# Patient Record
Sex: Female | Born: 1965
Health system: Southern US, Community
[De-identification: ages and names within clinical notes are randomized; demographics above are authoritative.]

## PROBLEM LIST (undated history)

## (undated) DIAGNOSIS — M1710 Unilateral primary osteoarthritis, unspecified knee: Secondary | ICD-10-CM

## (undated) DIAGNOSIS — I10 Essential (primary) hypertension: Secondary | ICD-10-CM

## (undated) DIAGNOSIS — G4730 Sleep apnea, unspecified: Secondary | ICD-10-CM

## (undated) DIAGNOSIS — F329 Major depressive disorder, single episode, unspecified: Secondary | ICD-10-CM

## (undated) DIAGNOSIS — M75120 Complete rotator cuff tear or rupture of unspecified shoulder, not specified as traumatic: Secondary | ICD-10-CM

## (undated) DIAGNOSIS — M179 Osteoarthritis of knee, unspecified: Secondary | ICD-10-CM

## (undated) DIAGNOSIS — G8929 Other chronic pain: Secondary | ICD-10-CM

## (undated) DIAGNOSIS — E669 Obesity, unspecified: Secondary | ICD-10-CM

## (undated) DIAGNOSIS — K219 Gastro-esophageal reflux disease without esophagitis: Secondary | ICD-10-CM

## (undated) DIAGNOSIS — M25572 Pain in left ankle and joints of left foot: Secondary | ICD-10-CM

## (undated) DIAGNOSIS — F32A Depression, unspecified: Secondary | ICD-10-CM

## (undated) DIAGNOSIS — F419 Anxiety disorder, unspecified: Secondary | ICD-10-CM

## (undated) DIAGNOSIS — E785 Hyperlipidemia, unspecified: Secondary | ICD-10-CM

## (undated) DIAGNOSIS — N62 Hypertrophy of breast: Secondary | ICD-10-CM

## (undated) DIAGNOSIS — M25569 Pain in unspecified knee: Secondary | ICD-10-CM

## (undated) DIAGNOSIS — M5412 Radiculopathy, cervical region: Secondary | ICD-10-CM

## (undated) DIAGNOSIS — E039 Hypothyroidism, unspecified: Secondary | ICD-10-CM

## (undated) DIAGNOSIS — M5020 Other cervical disc displacement, unspecified cervical region: Secondary | ICD-10-CM

## (undated) DIAGNOSIS — M549 Dorsalgia, unspecified: Secondary | ICD-10-CM

## (undated) DIAGNOSIS — D649 Anemia, unspecified: Secondary | ICD-10-CM

## (undated) DIAGNOSIS — G5600 Carpal tunnel syndrome, unspecified upper limb: Secondary | ICD-10-CM

## (undated) DIAGNOSIS — T7840XA Allergy, unspecified, initial encounter: Secondary | ICD-10-CM

## (undated) HISTORY — DX: Obesity, unspecified: E66.9

## (undated) HISTORY — DX: Unilateral primary osteoarthritis, unspecified knee: M17.10

## (undated) HISTORY — PX: PARTIAL HYSTERECTOMY: SHX80

## (undated) HISTORY — DX: Complete rotator cuff tear or rupture of unspecified shoulder, not specified as traumatic: M75.120

## (undated) HISTORY — DX: Other cervical disc displacement, unspecified cervical region: M50.20

## (undated) HISTORY — DX: Other chronic pain: G89.29

## (undated) HISTORY — PX: POLYPECTOMY: SHX149

## (undated) HISTORY — DX: Anemia, unspecified: D64.9

## (undated) HISTORY — DX: Anxiety disorder, unspecified: F41.9

## (undated) HISTORY — DX: Carpal tunnel syndrome, unspecified upper limb: G56.00

## (undated) HISTORY — DX: Depression, unspecified: F32.A

## (undated) HISTORY — DX: Osteoarthritis of knee, unspecified: M17.9

## (undated) HISTORY — DX: Dorsalgia, unspecified: M54.9

## (undated) HISTORY — DX: Radiculopathy, cervical region: M54.12

## (undated) HISTORY — DX: Hyperlipidemia, unspecified: E78.5

## (undated) HISTORY — DX: Major depressive disorder, single episode, unspecified: F32.9

## (undated) HISTORY — DX: Pain in unspecified knee: M25.569

## (undated) HISTORY — PX: COLONOSCOPY: SHX174

## (undated) HISTORY — DX: Pain in left ankle and joints of left foot: M25.572

## (undated) HISTORY — DX: Hypertrophy of breast: N62

## (undated) HISTORY — DX: Allergy, unspecified, initial encounter: T78.40XA

---

## 2000-11-01 ENCOUNTER — Encounter: Payer: Self-pay | Admitting: Internal Medicine

## 2000-11-01 ENCOUNTER — Ambulatory Visit (HOSPITAL_COMMUNITY): Admission: RE | Admit: 2000-11-01 | Discharge: 2000-11-01 | Payer: Self-pay | Admitting: Internal Medicine

## 2001-01-04 ENCOUNTER — Encounter: Admission: RE | Admit: 2001-01-04 | Discharge: 2001-01-04 | Payer: Self-pay | Admitting: Neurosurgery

## 2001-01-04 ENCOUNTER — Encounter: Payer: Self-pay | Admitting: Neurosurgery

## 2001-01-06 ENCOUNTER — Emergency Department (HOSPITAL_COMMUNITY): Admission: EM | Admit: 2001-01-06 | Discharge: 2001-01-06 | Payer: Self-pay | Admitting: Internal Medicine

## 2001-01-28 ENCOUNTER — Encounter: Admission: RE | Admit: 2001-01-28 | Discharge: 2001-01-28 | Payer: Self-pay | Admitting: Neurosurgery

## 2001-01-28 ENCOUNTER — Encounter: Payer: Self-pay | Admitting: Neurosurgery

## 2001-02-12 ENCOUNTER — Encounter: Admission: RE | Admit: 2001-02-12 | Discharge: 2001-02-12 | Payer: Self-pay | Admitting: Neurosurgery

## 2001-02-12 ENCOUNTER — Encounter: Payer: Self-pay | Admitting: Neurosurgery

## 2001-02-27 HISTORY — PX: OTHER SURGICAL HISTORY: SHX169

## 2003-09-04 ENCOUNTER — Ambulatory Visit (HOSPITAL_COMMUNITY): Admission: RE | Admit: 2003-09-04 | Discharge: 2003-09-04 | Payer: Self-pay | Admitting: Family Medicine

## 2004-01-11 ENCOUNTER — Ambulatory Visit: Payer: Self-pay | Admitting: Family Medicine

## 2004-01-19 ENCOUNTER — Encounter: Payer: Self-pay | Admitting: Orthopedic Surgery

## 2004-01-19 ENCOUNTER — Ambulatory Visit (HOSPITAL_COMMUNITY): Admission: RE | Admit: 2004-01-19 | Discharge: 2004-01-19 | Payer: Self-pay | Admitting: Family Medicine

## 2004-02-16 ENCOUNTER — Encounter: Payer: Self-pay | Admitting: Orthopedic Surgery

## 2004-02-16 ENCOUNTER — Emergency Department (HOSPITAL_COMMUNITY): Admission: EM | Admit: 2004-02-16 | Discharge: 2004-02-16 | Payer: Self-pay | Admitting: Emergency Medicine

## 2004-03-02 ENCOUNTER — Ambulatory Visit: Payer: Self-pay | Admitting: Family Medicine

## 2004-03-27 ENCOUNTER — Encounter: Payer: Self-pay | Admitting: Family Medicine

## 2004-03-27 ENCOUNTER — Ambulatory Visit: Admission: RE | Admit: 2004-03-27 | Discharge: 2004-03-27 | Payer: Self-pay | Admitting: Family Medicine

## 2004-03-27 ENCOUNTER — Ambulatory Visit: Payer: Self-pay | Admitting: Pulmonary Disease

## 2004-04-15 ENCOUNTER — Ambulatory Visit: Payer: Self-pay | Admitting: Family Medicine

## 2004-04-28 ENCOUNTER — Encounter (HOSPITAL_COMMUNITY): Admission: RE | Admit: 2004-04-28 | Discharge: 2004-05-28 | Payer: Self-pay | Admitting: Family Medicine

## 2004-06-13 ENCOUNTER — Ambulatory Visit: Payer: Self-pay | Admitting: Family Medicine

## 2004-06-17 ENCOUNTER — Ambulatory Visit (HOSPITAL_COMMUNITY): Admission: RE | Admit: 2004-06-17 | Discharge: 2004-06-17 | Payer: Self-pay | Admitting: Family Medicine

## 2004-06-26 ENCOUNTER — Emergency Department (HOSPITAL_COMMUNITY): Admission: EM | Admit: 2004-06-26 | Discharge: 2004-06-26 | Payer: Self-pay | Admitting: Emergency Medicine

## 2004-07-01 ENCOUNTER — Ambulatory Visit: Payer: Self-pay | Admitting: Family Medicine

## 2004-07-08 ENCOUNTER — Ambulatory Visit: Payer: Self-pay | Admitting: Family Medicine

## 2004-08-19 ENCOUNTER — Ambulatory Visit: Payer: Self-pay | Admitting: Family Medicine

## 2004-10-28 ENCOUNTER — Ambulatory Visit: Payer: Self-pay | Admitting: Family Medicine

## 2004-11-08 ENCOUNTER — Ambulatory Visit (HOSPITAL_COMMUNITY): Admission: RE | Admit: 2004-11-08 | Discharge: 2004-11-08 | Payer: Self-pay | Admitting: Family Medicine

## 2005-01-06 ENCOUNTER — Ambulatory Visit: Payer: Self-pay | Admitting: Family Medicine

## 2005-01-23 ENCOUNTER — Ambulatory Visit (HOSPITAL_COMMUNITY): Admission: RE | Admit: 2005-01-23 | Discharge: 2005-01-23 | Payer: Self-pay | Admitting: Family Medicine

## 2005-02-17 ENCOUNTER — Ambulatory Visit: Payer: Self-pay | Admitting: Family Medicine

## 2005-02-22 ENCOUNTER — Ambulatory Visit (HOSPITAL_COMMUNITY): Admission: RE | Admit: 2005-02-22 | Discharge: 2005-02-22 | Payer: Self-pay | Admitting: Family Medicine

## 2005-02-28 ENCOUNTER — Ambulatory Visit: Payer: Self-pay | Admitting: Family Medicine

## 2005-03-24 ENCOUNTER — Ambulatory Visit: Payer: Self-pay | Admitting: Family Medicine

## 2005-06-01 ENCOUNTER — Ambulatory Visit: Payer: Self-pay | Admitting: Family Medicine

## 2005-06-23 ENCOUNTER — Encounter: Admission: RE | Admit: 2005-06-23 | Discharge: 2005-06-23 | Payer: Self-pay | Admitting: Family Medicine

## 2005-07-07 ENCOUNTER — Encounter: Admission: RE | Admit: 2005-07-07 | Discharge: 2005-07-07 | Payer: Self-pay | Admitting: Family Medicine

## 2005-07-13 ENCOUNTER — Ambulatory Visit: Payer: Self-pay | Admitting: Family Medicine

## 2005-07-30 ENCOUNTER — Emergency Department (HOSPITAL_COMMUNITY): Admission: EM | Admit: 2005-07-30 | Discharge: 2005-07-30 | Payer: Self-pay | Admitting: Emergency Medicine

## 2005-09-15 ENCOUNTER — Other Ambulatory Visit: Admission: RE | Admit: 2005-09-15 | Discharge: 2005-09-15 | Payer: Self-pay | Admitting: Family Medicine

## 2005-09-15 ENCOUNTER — Ambulatory Visit: Payer: Self-pay | Admitting: Family Medicine

## 2005-09-15 LAB — CONVERTED CEMR LAB: Pap Smear: NORMAL

## 2005-10-27 ENCOUNTER — Ambulatory Visit: Payer: Self-pay | Admitting: Family Medicine

## 2005-11-01 ENCOUNTER — Ambulatory Visit (HOSPITAL_COMMUNITY): Admission: RE | Admit: 2005-11-01 | Discharge: 2005-11-01 | Payer: Self-pay | Admitting: Family Medicine

## 2006-01-26 ENCOUNTER — Ambulatory Visit: Payer: Self-pay | Admitting: Family Medicine

## 2006-04-17 ENCOUNTER — Emergency Department (HOSPITAL_COMMUNITY): Admission: EM | Admit: 2006-04-17 | Discharge: 2006-04-17 | Payer: Self-pay | Admitting: Emergency Medicine

## 2006-04-18 ENCOUNTER — Ambulatory Visit: Payer: Self-pay | Admitting: Family Medicine

## 2006-04-18 LAB — CONVERTED CEMR LAB
BUN: 16 mg/dL (ref 6–23)
CO2: 20 meq/L (ref 19–32)
Calcium: 10.1 mg/dL (ref 8.4–10.5)
Chloride: 103 meq/L (ref 96–112)
Cholesterol: 255 mg/dL — ABNORMAL HIGH (ref 0–200)
Creatinine, Ser: 0.93 mg/dL (ref 0.40–1.20)
Glucose, Bld: 96 mg/dL (ref 70–99)
HDL: 66 mg/dL (ref >39)
LDL Cholesterol: 171 mg/dL — ABNORMAL HIGH (ref 0–99)
Potassium: 4.6 meq/L (ref 3.5–5.3)
Sodium: 142 meq/L (ref 135–145)
TSH: 3.972 microintl units/mL (ref 0.350–5.50)
Total CHOL/HDL Ratio: 3.9
Triglycerides: 90 mg/dL (ref <150)
VLDL: 18 mg/dL (ref 0–40)

## 2006-04-19 ENCOUNTER — Encounter: Payer: Self-pay | Admitting: Family Medicine

## 2006-04-19 LAB — CONVERTED CEMR LAB
ALT: 28 units/L (ref 0–35)
AST: 19 units/L (ref 0–37)
Albumin: 4.3 g/dL (ref 3.5–5.2)
Alkaline Phosphatase: 134 units/L — ABNORMAL HIGH (ref 39–117)
Bilirubin, Direct: 0.1 mg/dL (ref 0.0–0.3)
Indirect Bilirubin: 0.3 mg/dL (ref 0.0–0.9)
Total Bilirubin: 0.4 mg/dL (ref 0.3–1.2)
Total Protein: 8.2 g/dL (ref 6.0–8.3)

## 2006-05-16 ENCOUNTER — Ambulatory Visit (HOSPITAL_COMMUNITY): Admission: RE | Admit: 2006-05-16 | Discharge: 2006-05-16 | Payer: Self-pay | Admitting: Family Medicine

## 2006-06-19 ENCOUNTER — Ambulatory Visit: Payer: Self-pay | Admitting: Family Medicine

## 2006-07-19 ENCOUNTER — Ambulatory Visit: Payer: Self-pay | Admitting: Family Medicine

## 2006-07-19 LAB — CONVERTED CEMR LAB
ALT: 38 units/L — ABNORMAL HIGH (ref 0–35)
AST: 21 units/L (ref 0–37)
Albumin: 4.2 g/dL (ref 3.5–5.2)
Alkaline Phosphatase: 189 units/L — ABNORMAL HIGH (ref 39–117)
BUN: 19 mg/dL (ref 6–23)
Bilirubin, Direct: <0.1 mg/dL (ref 0.0–0.3)
CO2: 25 meq/L (ref 19–32)
Calcium: 10 mg/dL (ref 8.4–10.5)
Chloride: 104 meq/L (ref 96–112)
Cholesterol: 173 mg/dL (ref 0–200)
Creatinine, Ser: 0.83 mg/dL (ref 0.40–1.20)
Glucose, Bld: 92 mg/dL (ref 70–99)
HDL: 79 mg/dL (ref >39)
LDL Cholesterol: 76 mg/dL (ref 0–99)
Potassium: 4.7 meq/L (ref 3.5–5.3)
Sodium: 142 meq/L (ref 135–145)
Total Bilirubin: 0.3 mg/dL (ref 0.3–1.2)
Total CHOL/HDL Ratio: 2.2
Total Protein: 8.4 g/dL — ABNORMAL HIGH (ref 6.0–8.3)
Triglycerides: 90 mg/dL (ref <150)
VLDL: 18 mg/dL (ref 0–40)

## 2006-12-03 ENCOUNTER — Ambulatory Visit: Payer: Self-pay | Admitting: Family Medicine

## 2006-12-04 ENCOUNTER — Encounter: Payer: Self-pay | Admitting: Family Medicine

## 2006-12-04 LAB — CONVERTED CEMR LAB
ALT: 26 units/L (ref 0–35)
AST: 29 units/L (ref 0–37)
Albumin: 3.8 g/dL (ref 3.5–5.2)
Alkaline Phosphatase: 138 units/L — ABNORMAL HIGH (ref 39–117)
Bilirubin, Direct: 0.1 mg/dL (ref 0.0–0.3)
Cholesterol: 160 mg/dL (ref 0–200)
Glucose, Bld: 104 mg/dL — ABNORMAL HIGH (ref 70–99)
HDL: 62 mg/dL (ref >39)
Indirect Bilirubin: 0.2 mg/dL (ref 0.0–0.9)
LDL Cholesterol: 80 mg/dL (ref 0–99)
Total Bilirubin: 0.3 mg/dL (ref 0.3–1.2)
Total CHOL/HDL Ratio: 2.6
Total Protein: 7.5 g/dL (ref 6.0–8.3)
Triglycerides: 88 mg/dL (ref <150)
VLDL: 18 mg/dL (ref 0–40)

## 2007-02-28 ENCOUNTER — Encounter: Payer: Self-pay | Admitting: Family Medicine

## 2007-04-03 ENCOUNTER — Ambulatory Visit: Payer: Self-pay | Admitting: Family Medicine

## 2007-04-03 LAB — CONVERTED CEMR LAB
BUN: 16 mg/dL (ref 6–23)
CO2: 23 meq/L (ref 19–32)
Calcium: 9.4 mg/dL (ref 8.4–10.5)
Chloride: 103 meq/L (ref 96–112)
Cholesterol: 217 mg/dL — ABNORMAL HIGH (ref 0–200)
Creatinine, Ser: 0.69 mg/dL (ref 0.40–1.20)
Glucose, Bld: 80 mg/dL (ref 70–99)
HDL: 71 mg/dL (ref >39)
LDL Cholesterol: 133 mg/dL — ABNORMAL HIGH (ref 0–99)
Potassium: 5.4 meq/L — ABNORMAL HIGH (ref 3.5–5.3)
Sodium: 140 meq/L (ref 135–145)
Total CHOL/HDL Ratio: 3.1
Triglycerides: 67 mg/dL (ref <150)
VLDL: 13 mg/dL (ref 0–40)

## 2007-04-22 ENCOUNTER — Emergency Department (HOSPITAL_COMMUNITY): Admission: EM | Admit: 2007-04-22 | Discharge: 2007-04-22 | Payer: Self-pay | Admitting: Emergency Medicine

## 2007-04-22 ENCOUNTER — Encounter: Payer: Self-pay | Admitting: Orthopedic Surgery

## 2007-05-03 ENCOUNTER — Ambulatory Visit: Payer: Self-pay | Admitting: Family Medicine

## 2007-05-20 ENCOUNTER — Ambulatory Visit (HOSPITAL_COMMUNITY): Admission: RE | Admit: 2007-05-20 | Discharge: 2007-05-20 | Payer: Self-pay | Admitting: Family Medicine

## 2007-05-21 ENCOUNTER — Ambulatory Visit: Payer: Self-pay | Admitting: Orthopedic Surgery

## 2007-05-30 ENCOUNTER — Encounter: Payer: Self-pay | Admitting: Orthopedic Surgery

## 2007-06-04 ENCOUNTER — Telehealth: Payer: Self-pay | Admitting: Orthopedic Surgery

## 2007-06-14 ENCOUNTER — Ambulatory Visit: Payer: Self-pay | Admitting: Family Medicine

## 2007-06-17 ENCOUNTER — Ambulatory Visit: Payer: Self-pay | Admitting: Orthopedic Surgery

## 2007-06-20 ENCOUNTER — Encounter: Payer: Self-pay | Admitting: Orthopedic Surgery

## 2007-06-20 ENCOUNTER — Encounter (HOSPITAL_COMMUNITY): Admission: RE | Admit: 2007-06-20 | Discharge: 2007-07-20 | Payer: Self-pay | Admitting: Orthopedic Surgery

## 2007-06-25 DIAGNOSIS — E669 Obesity, unspecified: Secondary | ICD-10-CM | POA: Insufficient documentation

## 2007-06-25 DIAGNOSIS — J45909 Unspecified asthma, uncomplicated: Secondary | ICD-10-CM | POA: Insufficient documentation

## 2007-06-25 DIAGNOSIS — F329 Major depressive disorder, single episode, unspecified: Secondary | ICD-10-CM | POA: Insufficient documentation

## 2007-06-25 DIAGNOSIS — N62 Hypertrophy of breast: Secondary | ICD-10-CM | POA: Insufficient documentation

## 2007-06-25 DIAGNOSIS — R32 Unspecified urinary incontinence: Secondary | ICD-10-CM | POA: Insufficient documentation

## 2007-07-24 ENCOUNTER — Encounter (HOSPITAL_COMMUNITY): Admission: RE | Admit: 2007-07-24 | Discharge: 2007-08-23 | Payer: Self-pay | Admitting: Orthopedic Surgery

## 2007-08-01 ENCOUNTER — Encounter: Payer: Self-pay | Admitting: Orthopedic Surgery

## 2007-08-02 ENCOUNTER — Encounter: Payer: Self-pay | Admitting: Orthopedic Surgery

## 2007-08-19 ENCOUNTER — Ambulatory Visit: Payer: Self-pay | Admitting: Orthopedic Surgery

## 2007-08-19 DIAGNOSIS — M25579 Pain in unspecified ankle and joints of unspecified foot: Secondary | ICD-10-CM | POA: Insufficient documentation

## 2007-08-21 ENCOUNTER — Ambulatory Visit: Payer: Self-pay | Admitting: Family Medicine

## 2007-08-21 LAB — CONVERTED CEMR LAB
ALT: 27 units/L (ref 0–35)
AST: 19 units/L (ref 0–37)
Albumin: 3.7 g/dL (ref 3.5–5.2)
Alkaline Phosphatase: 129 units/L — ABNORMAL HIGH (ref 39–117)
Bilirubin, Direct: <0.1 mg/dL (ref 0.0–0.3)
Cholesterol: 218 mg/dL — ABNORMAL HIGH (ref 0–200)
HDL: 61 mg/dL (ref >39)
LDL Cholesterol: 134 mg/dL — ABNORMAL HIGH (ref 0–99)
Total Bilirubin: 0.2 mg/dL — ABNORMAL LOW (ref 0.3–1.2)
Total CHOL/HDL Ratio: 3.6
Total Protein: 7.5 g/dL (ref 6.0–8.3)
Triglycerides: 114 mg/dL (ref <150)
VLDL: 23 mg/dL (ref 0–40)

## 2007-09-02 ENCOUNTER — Ambulatory Visit: Payer: Self-pay | Admitting: Orthopedic Surgery

## 2007-09-30 ENCOUNTER — Encounter: Payer: Self-pay | Admitting: Family Medicine

## 2007-10-07 ENCOUNTER — Ambulatory Visit: Payer: Self-pay | Admitting: Orthopedic Surgery

## 2007-10-07 ENCOUNTER — Ambulatory Visit (HOSPITAL_COMMUNITY): Admission: RE | Admit: 2007-10-07 | Discharge: 2007-10-07 | Payer: Self-pay | Admitting: Orthopedic Surgery

## 2007-10-07 DIAGNOSIS — M5412 Radiculopathy, cervical region: Secondary | ICD-10-CM | POA: Insufficient documentation

## 2007-10-08 ENCOUNTER — Telehealth: Payer: Self-pay | Admitting: Family Medicine

## 2007-10-09 ENCOUNTER — Ambulatory Visit: Payer: Self-pay | Admitting: Family Medicine

## 2007-10-15 ENCOUNTER — Encounter (HOSPITAL_COMMUNITY): Admission: RE | Admit: 2007-10-15 | Discharge: 2007-11-14 | Payer: Self-pay | Admitting: Orthopedic Surgery

## 2007-10-15 ENCOUNTER — Encounter: Payer: Self-pay | Admitting: Orthopedic Surgery

## 2007-10-16 ENCOUNTER — Encounter: Payer: Self-pay | Admitting: Family Medicine

## 2007-10-17 ENCOUNTER — Ambulatory Visit: Payer: Self-pay | Admitting: Family Medicine

## 2007-10-17 ENCOUNTER — Other Ambulatory Visit: Admission: RE | Admit: 2007-10-17 | Discharge: 2007-10-17 | Payer: Self-pay | Admitting: Family Medicine

## 2007-10-17 ENCOUNTER — Encounter: Payer: Self-pay | Admitting: Family Medicine

## 2007-10-17 DIAGNOSIS — M25519 Pain in unspecified shoulder: Secondary | ICD-10-CM | POA: Insufficient documentation

## 2007-10-17 DIAGNOSIS — J309 Allergic rhinitis, unspecified: Secondary | ICD-10-CM | POA: Insufficient documentation

## 2007-10-28 ENCOUNTER — Ambulatory Visit: Payer: Self-pay | Admitting: Orthopedic Surgery

## 2007-11-19 ENCOUNTER — Encounter (HOSPITAL_COMMUNITY): Admission: RE | Admit: 2007-11-19 | Discharge: 2007-11-27 | Payer: Self-pay | Admitting: Orthopedic Surgery

## 2007-11-19 ENCOUNTER — Encounter: Payer: Self-pay | Admitting: Family Medicine

## 2007-11-20 ENCOUNTER — Encounter: Payer: Self-pay | Admitting: Orthopedic Surgery

## 2007-11-25 ENCOUNTER — Encounter: Payer: Self-pay | Admitting: Orthopedic Surgery

## 2007-11-25 LAB — CONVERTED CEMR LAB
ALT: 29 units/L (ref 0–35)
AST: 26 units/L (ref 0–37)
Albumin: 4 g/dL (ref 3.5–5.2)
Alkaline Phosphatase: 165 units/L — ABNORMAL HIGH (ref 39–117)
BUN: 9 mg/dL (ref 6–23)
Bilirubin, Direct: 0.1 mg/dL (ref 0.0–0.3)
CO2: 25 meq/L (ref 19–32)
Calcium: 9.5 mg/dL (ref 8.4–10.5)
Chloride: 103 meq/L (ref 96–112)
Cholesterol: 222 mg/dL — ABNORMAL HIGH (ref 0–200)
Creatinine, Ser: 0.77 mg/dL (ref 0.40–1.20)
Glucose, Bld: 89 mg/dL (ref 70–99)
HDL: 69 mg/dL (ref >39)
Indirect Bilirubin: 0.2 mg/dL (ref 0.0–0.9)
LDL Cholesterol: 126 mg/dL — ABNORMAL HIGH (ref 0–99)
Potassium: 4.2 meq/L (ref 3.5–5.3)
Sodium: 143 meq/L (ref 135–145)
Total Bilirubin: 0.3 mg/dL (ref 0.3–1.2)
Total CHOL/HDL Ratio: 3.2
Total Protein: 7.9 g/dL (ref 6.0–8.3)
Triglycerides: 135 mg/dL (ref <150)
VLDL: 27 mg/dL (ref 0–40)

## 2007-11-29 ENCOUNTER — Encounter (HOSPITAL_COMMUNITY): Admission: RE | Admit: 2007-11-29 | Discharge: 2007-12-29 | Payer: Self-pay | Admitting: Orthopedic Surgery

## 2007-12-06 ENCOUNTER — Encounter: Payer: Self-pay | Admitting: Orthopedic Surgery

## 2007-12-30 ENCOUNTER — Encounter: Payer: Self-pay | Admitting: Orthopedic Surgery

## 2008-01-09 ENCOUNTER — Ambulatory Visit: Payer: Self-pay | Admitting: Family Medicine

## 2008-01-17 ENCOUNTER — Encounter (HOSPITAL_COMMUNITY): Admission: RE | Admit: 2008-01-17 | Discharge: 2008-02-16 | Payer: Self-pay | Admitting: Family Medicine

## 2008-01-17 ENCOUNTER — Encounter: Payer: Self-pay | Admitting: Family Medicine

## 2008-02-03 ENCOUNTER — Ambulatory Visit: Payer: Self-pay | Admitting: Family Medicine

## 2008-02-05 ENCOUNTER — Encounter: Payer: Self-pay | Admitting: Family Medicine

## 2008-02-06 ENCOUNTER — Telehealth: Payer: Self-pay | Admitting: Family Medicine

## 2008-03-09 ENCOUNTER — Encounter
Admission: RE | Admit: 2008-03-09 | Discharge: 2008-06-07 | Payer: Self-pay | Admitting: Physical Medicine & Rehabilitation

## 2008-03-10 ENCOUNTER — Encounter: Payer: Self-pay | Admitting: Family Medicine

## 2008-03-10 ENCOUNTER — Ambulatory Visit (HOSPITAL_COMMUNITY)
Admission: RE | Admit: 2008-03-10 | Discharge: 2008-03-10 | Payer: Self-pay | Admitting: Physical Medicine & Rehabilitation

## 2008-03-10 ENCOUNTER — Ambulatory Visit: Payer: Self-pay | Admitting: Physical Medicine & Rehabilitation

## 2008-03-10 ENCOUNTER — Encounter: Payer: Self-pay | Admitting: Orthopedic Surgery

## 2008-03-18 ENCOUNTER — Ambulatory Visit: Payer: Self-pay | Admitting: Family Medicine

## 2008-03-19 ENCOUNTER — Encounter: Payer: Self-pay | Admitting: Family Medicine

## 2008-04-07 ENCOUNTER — Ambulatory Visit: Payer: Self-pay | Admitting: Physical Medicine & Rehabilitation

## 2008-05-18 ENCOUNTER — Ambulatory Visit: Payer: Self-pay | Admitting: Family Medicine

## 2008-05-18 DIAGNOSIS — R5381 Other malaise: Secondary | ICD-10-CM | POA: Insufficient documentation

## 2008-05-18 DIAGNOSIS — R5383 Other fatigue: Secondary | ICD-10-CM

## 2008-05-20 LAB — CONVERTED CEMR LAB
ALT: 34 units/L (ref 0–35)
AST: 25 units/L (ref 0–37)
Albumin: 4.2 g/dL (ref 3.5–5.2)
Alkaline Phosphatase: 161 units/L — ABNORMAL HIGH (ref 39–117)
BUN: 11 mg/dL (ref 6–23)
Basophils Absolute: 0 10*3/uL (ref 0.0–0.1)
Basophils Relative: 0 % (ref 0–1)
Bilirubin, Direct: 0.1 mg/dL (ref 0.0–0.3)
CO2: 25 meq/L (ref 19–32)
Calcium: 9.8 mg/dL (ref 8.4–10.5)
Chloride: 105 meq/L (ref 96–112)
Cholesterol: 254 mg/dL — ABNORMAL HIGH (ref 0–200)
Creatinine, Ser: 0.69 mg/dL (ref 0.40–1.20)
Eosinophils Absolute: 0.1 10*3/uL (ref 0.0–0.7)
Eosinophils Relative: 2 % (ref 0–5)
Glucose, Bld: 87 mg/dL (ref 70–99)
HCT: 38.8 % (ref 36.0–46.0)
HDL: 68 mg/dL (ref >39)
Hemoglobin: 12.3 g/dL (ref 12.0–15.0)
Indirect Bilirubin: 0.2 mg/dL (ref 0.0–0.9)
LDL Cholesterol: 161 mg/dL — ABNORMAL HIGH (ref 0–99)
Lymphocytes Relative: 46 % (ref 12–46)
Lymphs Abs: 3.7 10*3/uL (ref 0.7–4.0)
MCHC: 31.7 g/dL (ref 30.0–36.0)
MCV: 85.8 fL (ref 78.0–100.0)
Monocytes Absolute: 0.4 10*3/uL (ref 0.1–1.0)
Monocytes Relative: 5 % (ref 3–12)
Neutro Abs: 3.9 10*3/uL (ref 1.7–7.7)
Neutrophils Relative %: 47 % (ref 43–77)
Platelets: 342 10*3/uL (ref 150–400)
Potassium: 4.6 meq/L (ref 3.5–5.3)
RBC: 4.52 M/uL (ref 3.87–5.11)
RDW: 16.4 % — ABNORMAL HIGH (ref 11.5–15.5)
Sodium: 143 meq/L (ref 135–145)
Total Bilirubin: 0.3 mg/dL (ref 0.3–1.2)
Total CHOL/HDL Ratio: 3.7
Total Protein: 8.2 g/dL (ref 6.0–8.3)
Triglycerides: 127 mg/dL (ref <150)
VLDL: 25 mg/dL (ref 0–40)
WBC: 8.2 10*3/uL (ref 4.0–10.5)

## 2008-05-28 ENCOUNTER — Ambulatory Visit (HOSPITAL_COMMUNITY): Admission: RE | Admit: 2008-05-28 | Discharge: 2008-05-28 | Payer: Self-pay | Admitting: Family Medicine

## 2008-06-03 ENCOUNTER — Ambulatory Visit: Payer: Self-pay | Admitting: Orthopedic Surgery

## 2008-06-03 ENCOUNTER — Encounter
Admission: RE | Admit: 2008-06-03 | Discharge: 2008-09-01 | Payer: Self-pay | Admitting: Physical Medicine & Rehabilitation

## 2008-06-08 ENCOUNTER — Ambulatory Visit: Payer: Self-pay | Admitting: Physical Medicine & Rehabilitation

## 2008-06-16 ENCOUNTER — Telehealth: Payer: Self-pay | Admitting: Orthopedic Surgery

## 2008-06-18 ENCOUNTER — Encounter: Payer: Self-pay | Admitting: Orthopedic Surgery

## 2008-07-06 ENCOUNTER — Ambulatory Visit: Payer: Self-pay | Admitting: Physical Medicine & Rehabilitation

## 2008-07-17 ENCOUNTER — Telehealth: Payer: Self-pay | Admitting: Family Medicine

## 2008-07-21 ENCOUNTER — Telehealth: Payer: Self-pay | Admitting: Family Medicine

## 2008-07-29 ENCOUNTER — Ambulatory Visit: Payer: Self-pay | Admitting: Orthopedic Surgery

## 2008-07-29 DIAGNOSIS — IMO0002 Reserved for concepts with insufficient information to code with codable children: Secondary | ICD-10-CM | POA: Insufficient documentation

## 2008-07-29 DIAGNOSIS — M1710 Unilateral primary osteoarthritis, unspecified knee: Secondary | ICD-10-CM

## 2008-08-03 ENCOUNTER — Ambulatory Visit: Payer: Self-pay | Admitting: Physical Medicine & Rehabilitation

## 2008-08-04 ENCOUNTER — Ambulatory Visit: Payer: Self-pay | Admitting: Family Medicine

## 2008-08-04 DIAGNOSIS — M79609 Pain in unspecified limb: Secondary | ICD-10-CM | POA: Insufficient documentation

## 2008-08-09 DIAGNOSIS — E785 Hyperlipidemia, unspecified: Secondary | ICD-10-CM | POA: Insufficient documentation

## 2008-08-10 ENCOUNTER — Encounter: Payer: Self-pay | Admitting: Family Medicine

## 2008-08-13 ENCOUNTER — Encounter: Payer: Self-pay | Admitting: Orthopedic Surgery

## 2008-08-14 ENCOUNTER — Ambulatory Visit (HOSPITAL_COMMUNITY): Admission: RE | Admit: 2008-08-14 | Discharge: 2008-08-14 | Payer: Self-pay | Admitting: Orthopedic Surgery

## 2008-08-14 ENCOUNTER — Ambulatory Visit: Payer: Self-pay | Admitting: Orthopedic Surgery

## 2008-08-14 HISTORY — PX: OTHER SURGICAL HISTORY: SHX169

## 2008-08-18 ENCOUNTER — Ambulatory Visit: Payer: Self-pay | Admitting: Orthopedic Surgery

## 2008-08-25 ENCOUNTER — Encounter (HOSPITAL_COMMUNITY): Admission: RE | Admit: 2008-08-25 | Discharge: 2008-09-24 | Payer: Self-pay | Admitting: Orthopedic Surgery

## 2008-09-04 ENCOUNTER — Telehealth: Payer: Self-pay | Admitting: Family Medicine

## 2008-09-09 ENCOUNTER — Ambulatory Visit: Payer: Self-pay | Admitting: Family Medicine

## 2008-09-09 ENCOUNTER — Encounter: Admission: RE | Admit: 2008-09-09 | Discharge: 2008-09-09 | Payer: Self-pay | Admitting: Family Medicine

## 2008-09-09 DIAGNOSIS — IMO0002 Reserved for concepts with insufficient information to code with codable children: Secondary | ICD-10-CM | POA: Insufficient documentation

## 2008-09-10 ENCOUNTER — Encounter
Admission: RE | Admit: 2008-09-10 | Discharge: 2008-12-09 | Payer: Self-pay | Admitting: Physical Medicine & Rehabilitation

## 2008-09-11 ENCOUNTER — Telehealth: Payer: Self-pay | Admitting: Family Medicine

## 2008-09-13 DIAGNOSIS — G4700 Insomnia, unspecified: Secondary | ICD-10-CM | POA: Insufficient documentation

## 2008-09-14 ENCOUNTER — Ambulatory Visit: Payer: Self-pay | Admitting: Orthopedic Surgery

## 2008-09-14 ENCOUNTER — Ambulatory Visit: Payer: Self-pay | Admitting: Physical Medicine & Rehabilitation

## 2008-09-23 ENCOUNTER — Encounter: Payer: Self-pay | Admitting: Orthopedic Surgery

## 2008-10-08 ENCOUNTER — Ambulatory Visit: Payer: Self-pay | Admitting: Family Medicine

## 2008-10-20 ENCOUNTER — Ambulatory Visit: Payer: Self-pay | Admitting: Physical Medicine & Rehabilitation

## 2008-10-21 ENCOUNTER — Encounter: Payer: Self-pay | Admitting: Family Medicine

## 2008-10-22 ENCOUNTER — Telehealth: Payer: Self-pay | Admitting: Family Medicine

## 2008-10-26 ENCOUNTER — Ambulatory Visit: Payer: Self-pay | Admitting: Physical Medicine & Rehabilitation

## 2008-10-28 ENCOUNTER — Ambulatory Visit (HOSPITAL_COMMUNITY)
Admission: RE | Admit: 2008-10-28 | Discharge: 2008-10-28 | Payer: Self-pay | Admitting: Physical Medicine & Rehabilitation

## 2008-10-28 ENCOUNTER — Encounter: Payer: Self-pay | Admitting: Family Medicine

## 2008-10-28 ENCOUNTER — Encounter: Payer: Self-pay | Admitting: Orthopedic Surgery

## 2008-11-03 ENCOUNTER — Encounter
Admission: RE | Admit: 2008-11-03 | Discharge: 2008-11-03 | Payer: Self-pay | Admitting: Physical Medicine & Rehabilitation

## 2008-11-10 ENCOUNTER — Encounter: Payer: Self-pay | Admitting: Family Medicine

## 2008-11-13 ENCOUNTER — Encounter: Admission: RE | Admit: 2008-11-13 | Discharge: 2008-11-13 | Payer: Self-pay | Admitting: Neurological Surgery

## 2008-11-27 ENCOUNTER — Encounter: Payer: Self-pay | Admitting: Family Medicine

## 2008-11-30 ENCOUNTER — Ambulatory Visit (HOSPITAL_COMMUNITY)
Admission: RE | Admit: 2008-11-30 | Discharge: 2008-11-30 | Payer: Self-pay | Admitting: Physical Medicine & Rehabilitation

## 2008-11-30 ENCOUNTER — Ambulatory Visit: Payer: Self-pay | Admitting: Physical Medicine & Rehabilitation

## 2008-11-30 ENCOUNTER — Encounter: Payer: Self-pay | Admitting: Orthopedic Surgery

## 2008-11-30 LAB — CONVERTED CEMR LAB
ALT: 20 units/L (ref 0–35)
AST: 17 units/L (ref 0–37)
Albumin: 3.5 g/dL (ref 3.5–5.2)
Alkaline Phosphatase: 117 units/L (ref 39–117)
Bilirubin, Direct: 0.1 mg/dL (ref 0.0–0.3)
Cholesterol: 238 mg/dL — ABNORMAL HIGH (ref 0–200)
HDL: 59 mg/dL (ref >39)
Indirect Bilirubin: 0.2 mg/dL (ref 0.0–0.9)
LDL Cholesterol: 159 mg/dL — ABNORMAL HIGH (ref 0–99)
TSH: 3.203 microintl units/mL (ref 0.350–4.500)
Total Bilirubin: 0.3 mg/dL (ref 0.3–1.2)
Total CHOL/HDL Ratio: 4
Total Protein: 6.7 g/dL (ref 6.0–8.3)
Triglycerides: 100 mg/dL (ref <150)
VLDL: 20 mg/dL (ref 0–40)

## 2008-12-07 ENCOUNTER — Encounter: Payer: Self-pay | Admitting: Family Medicine

## 2008-12-15 ENCOUNTER — Ambulatory Visit: Payer: Self-pay | Admitting: Family Medicine

## 2008-12-15 DIAGNOSIS — G5600 Carpal tunnel syndrome, unspecified upper limb: Secondary | ICD-10-CM | POA: Insufficient documentation

## 2009-01-08 ENCOUNTER — Telehealth: Payer: Self-pay | Admitting: Family Medicine

## 2009-01-14 ENCOUNTER — Encounter: Payer: Self-pay | Admitting: Family Medicine

## 2009-02-01 ENCOUNTER — Telehealth: Payer: Self-pay | Admitting: Family Medicine

## 2009-02-03 ENCOUNTER — Ambulatory Visit: Payer: Self-pay | Admitting: Orthopedic Surgery

## 2009-02-04 ENCOUNTER — Telehealth: Payer: Self-pay | Admitting: Orthopedic Surgery

## 2009-02-05 ENCOUNTER — Telehealth: Payer: Self-pay | Admitting: Orthopedic Surgery

## 2009-02-28 ENCOUNTER — Encounter: Admission: RE | Admit: 2009-02-28 | Discharge: 2009-02-28 | Payer: Self-pay | Admitting: Orthopedic Surgery

## 2009-03-01 ENCOUNTER — Encounter: Payer: Self-pay | Admitting: Family Medicine

## 2009-03-02 ENCOUNTER — Telehealth: Payer: Self-pay | Admitting: Orthopedic Surgery

## 2009-03-03 ENCOUNTER — Ambulatory Visit: Payer: Self-pay | Admitting: Family Medicine

## 2009-03-16 ENCOUNTER — Telehealth: Payer: Self-pay | Admitting: Orthopedic Surgery

## 2009-03-17 ENCOUNTER — Ambulatory Visit: Payer: Self-pay | Admitting: Family Medicine

## 2009-03-17 DIAGNOSIS — L259 Unspecified contact dermatitis, unspecified cause: Secondary | ICD-10-CM | POA: Insufficient documentation

## 2009-03-18 ENCOUNTER — Encounter: Payer: Self-pay | Admitting: Family Medicine

## 2009-03-18 LAB — CONVERTED CEMR LAB
BUN: 14 mg/dL (ref 6–23)
CO2: 27 meq/L (ref 19–32)
Calcium: 9.7 mg/dL (ref 8.4–10.5)
Chloride: 104 meq/L (ref 96–112)
Cholesterol: 242 mg/dL — ABNORMAL HIGH (ref 0–200)
Creatinine, Ser: 0.77 mg/dL (ref 0.40–1.20)
Glucose, Bld: 89 mg/dL (ref 70–99)
HDL: 53 mg/dL (ref >39)
LDL Cholesterol: 164 mg/dL — ABNORMAL HIGH (ref 0–99)
Potassium: 4.2 meq/L (ref 3.5–5.3)
Sodium: 142 meq/L (ref 135–145)
Total CHOL/HDL Ratio: 4.6
Triglycerides: 126 mg/dL (ref <150)
VLDL: 25 mg/dL (ref 0–40)

## 2009-03-25 ENCOUNTER — Telehealth: Payer: Self-pay | Admitting: Orthopedic Surgery

## 2009-03-30 ENCOUNTER — Encounter: Payer: Self-pay | Admitting: Family Medicine

## 2009-04-07 ENCOUNTER — Telehealth: Payer: Self-pay | Admitting: Family Medicine

## 2009-04-15 ENCOUNTER — Encounter: Payer: Self-pay | Admitting: Family Medicine

## 2009-05-10 ENCOUNTER — Telehealth: Payer: Self-pay | Admitting: Family Medicine

## 2009-05-13 ENCOUNTER — Telehealth: Payer: Self-pay | Admitting: Family Medicine

## 2009-05-14 ENCOUNTER — Telehealth: Payer: Self-pay | Admitting: Family Medicine

## 2009-05-20 ENCOUNTER — Telehealth: Payer: Self-pay | Admitting: Family Medicine

## 2009-06-17 ENCOUNTER — Ambulatory Visit (HOSPITAL_COMMUNITY): Admission: RE | Admit: 2009-06-17 | Discharge: 2009-06-17 | Payer: Self-pay | Admitting: Family Medicine

## 2009-06-29 ENCOUNTER — Ambulatory Visit: Payer: Self-pay | Admitting: Family Medicine

## 2009-06-29 DIAGNOSIS — B379 Candidiasis, unspecified: Secondary | ICD-10-CM | POA: Insufficient documentation

## 2009-06-29 DIAGNOSIS — B356 Tinea cruris: Secondary | ICD-10-CM | POA: Insufficient documentation

## 2009-07-27 ENCOUNTER — Telehealth: Payer: Self-pay | Admitting: Physician Assistant

## 2009-07-29 ENCOUNTER — Ambulatory Visit: Payer: Self-pay | Admitting: Family Medicine

## 2009-08-04 ENCOUNTER — Telehealth: Payer: Self-pay | Admitting: Orthopedic Surgery

## 2009-08-04 LAB — CONVERTED CEMR LAB
ALT: 15 units/L (ref 0–35)
AST: 18 units/L (ref 0–37)
Albumin: 4.1 g/dL (ref 3.5–5.2)
Alkaline Phosphatase: 116 units/L (ref 39–117)
BUN: 19 mg/dL (ref 6–23)
Bilirubin, Direct: 0.1 mg/dL (ref 0.0–0.3)
CO2: 27 meq/L (ref 19–32)
Calcium: 9.7 mg/dL (ref 8.4–10.5)
Chloride: 103 meq/L (ref 96–112)
Cholesterol: 171 mg/dL (ref 0–200)
Creatinine, Ser: 0.68 mg/dL (ref 0.40–1.20)
Glucose, Bld: 84 mg/dL (ref 70–99)
HDL: 69 mg/dL (ref >39)
Indirect Bilirubin: 0.2 mg/dL (ref 0.0–0.9)
LDL Cholesterol: 88 mg/dL (ref 0–99)
Potassium: 4.6 meq/L (ref 3.5–5.3)
Sodium: 140 meq/L (ref 135–145)
Total Bilirubin: 0.3 mg/dL (ref 0.3–1.2)
Total CHOL/HDL Ratio: 2.5
Total Protein: 7.4 g/dL (ref 6.0–8.3)
Triglycerides: 71 mg/dL (ref <150)
VLDL: 14 mg/dL (ref 0–40)

## 2009-08-05 ENCOUNTER — Telehealth: Payer: Self-pay | Admitting: Orthopedic Surgery

## 2009-08-06 ENCOUNTER — Encounter: Payer: Self-pay | Admitting: Orthopedic Surgery

## 2009-08-08 ENCOUNTER — Telehealth: Payer: Self-pay | Admitting: Family Medicine

## 2009-08-11 ENCOUNTER — Encounter: Payer: Self-pay | Admitting: Family Medicine

## 2009-08-24 ENCOUNTER — Telehealth: Payer: Self-pay | Admitting: Family Medicine

## 2009-09-02 ENCOUNTER — Encounter: Payer: Self-pay | Admitting: Family Medicine

## 2009-09-09 ENCOUNTER — Encounter: Admission: RE | Admit: 2009-09-09 | Discharge: 2009-09-09 | Payer: Self-pay | Admitting: Surgery

## 2009-09-14 ENCOUNTER — Ambulatory Visit (HOSPITAL_COMMUNITY): Admission: RE | Admit: 2009-09-14 | Discharge: 2009-09-14 | Payer: Self-pay | Admitting: Surgery

## 2009-09-21 ENCOUNTER — Ambulatory Visit (HOSPITAL_COMMUNITY): Admission: RE | Admit: 2009-09-21 | Discharge: 2009-09-21 | Payer: Self-pay | Admitting: Surgery

## 2009-10-05 ENCOUNTER — Telehealth: Payer: Self-pay | Admitting: Family Medicine

## 2009-10-06 ENCOUNTER — Encounter: Payer: Self-pay | Admitting: Orthopedic Surgery

## 2009-11-10 ENCOUNTER — Telehealth: Payer: Self-pay | Admitting: Family Medicine

## 2009-11-11 ENCOUNTER — Telehealth (INDEPENDENT_AMBULATORY_CARE_PROVIDER_SITE_OTHER): Payer: Self-pay | Admitting: *Deleted

## 2009-11-11 ENCOUNTER — Telehealth: Payer: Self-pay | Admitting: Family Medicine

## 2009-11-15 ENCOUNTER — Ambulatory Visit (HOSPITAL_COMMUNITY): Admission: RE | Admit: 2009-11-15 | Discharge: 2009-11-15 | Payer: Self-pay | Admitting: Surgery

## 2009-11-19 ENCOUNTER — Encounter: Payer: Self-pay | Admitting: Family Medicine

## 2009-11-26 ENCOUNTER — Ambulatory Visit: Payer: Self-pay | Admitting: Family Medicine

## 2009-11-26 DIAGNOSIS — H6690 Otitis media, unspecified, unspecified ear: Secondary | ICD-10-CM | POA: Insufficient documentation

## 2009-11-29 ENCOUNTER — Ambulatory Visit: Payer: Self-pay | Admitting: Orthopedic Surgery

## 2009-12-10 ENCOUNTER — Encounter: Payer: Self-pay | Admitting: Family Medicine

## 2009-12-21 ENCOUNTER — Telehealth: Payer: Self-pay | Admitting: Family Medicine

## 2009-12-30 ENCOUNTER — Telehealth: Payer: Self-pay | Admitting: Family Medicine

## 2009-12-30 ENCOUNTER — Ambulatory Visit: Payer: Self-pay | Admitting: Family Medicine

## 2009-12-30 DIAGNOSIS — R519 Headache, unspecified: Secondary | ICD-10-CM | POA: Insufficient documentation

## 2009-12-30 DIAGNOSIS — R51 Headache: Secondary | ICD-10-CM | POA: Insufficient documentation

## 2009-12-30 DIAGNOSIS — R7301 Impaired fasting glucose: Secondary | ICD-10-CM | POA: Insufficient documentation

## 2010-01-09 ENCOUNTER — Encounter: Payer: Self-pay | Admitting: Family Medicine

## 2010-02-14 ENCOUNTER — Emergency Department (HOSPITAL_COMMUNITY)
Admission: EM | Admit: 2010-02-14 | Discharge: 2010-02-14 | Payer: Self-pay | Source: Home / Self Care | Admitting: Emergency Medicine

## 2010-02-16 ENCOUNTER — Telehealth: Payer: Self-pay | Admitting: Family Medicine

## 2010-02-22 ENCOUNTER — Telehealth (INDEPENDENT_AMBULATORY_CARE_PROVIDER_SITE_OTHER): Payer: Self-pay | Admitting: *Deleted

## 2010-02-24 ENCOUNTER — Telehealth: Payer: Self-pay | Admitting: Family Medicine

## 2010-02-24 ENCOUNTER — Telehealth (INDEPENDENT_AMBULATORY_CARE_PROVIDER_SITE_OTHER): Payer: Self-pay | Admitting: *Deleted

## 2010-02-24 ENCOUNTER — Encounter: Payer: Self-pay | Admitting: Family Medicine

## 2010-02-24 ENCOUNTER — Ambulatory Visit
Admission: RE | Admit: 2010-02-24 | Discharge: 2010-02-24 | Payer: Self-pay | Source: Home / Self Care | Attending: Family Medicine | Admitting: Family Medicine

## 2010-03-02 ENCOUNTER — Encounter: Payer: Self-pay | Admitting: Family Medicine

## 2010-03-20 ENCOUNTER — Encounter: Payer: Self-pay | Admitting: Family Medicine

## 2010-03-20 ENCOUNTER — Encounter: Payer: Self-pay | Admitting: Orthopedic Surgery

## 2010-03-24 LAB — CONVERTED CEMR LAB
ALT: 39 units/L — ABNORMAL HIGH (ref 0–35)
AST: 30 units/L (ref 0–37)
Albumin: 4 g/dL (ref 3.5–5.2)
Alkaline Phosphatase: 124 units/L — ABNORMAL HIGH (ref 39–117)
BUN: 24 mg/dL — ABNORMAL HIGH (ref 6–23)
Basophils Absolute: 0 10*3/uL (ref 0.0–0.1)
Basophils Relative: 0 % (ref 0–1)
Bilirubin, Direct: <0.1 mg/dL (ref 0.0–0.3)
CO2: 31 meq/L (ref 19–32)
Calcium: 9.9 mg/dL (ref 8.4–10.5)
Chloride: 102 meq/L (ref 96–112)
Cholesterol: 204 mg/dL — ABNORMAL HIGH (ref 0–200)
Creatinine, Ser: 0.8 mg/dL (ref 0.40–1.20)
Eosinophils Absolute: 0 10*3/uL (ref 0.0–0.7)
Eosinophils Relative: 0 % (ref 0–5)
Glucose, Bld: 106 mg/dL — ABNORMAL HIGH (ref 70–99)
HCT: 37.6 % (ref 36.0–46.0)
HDL: 73 mg/dL (ref >39)
Hemoglobin: 12.2 g/dL (ref 12.0–15.0)
Hgb A1c MFr Bld: 5.8 % — ABNORMAL HIGH (ref <5.7)
LDL Cholesterol: 112 mg/dL — ABNORMAL HIGH (ref 0–99)
Lymphocytes Relative: 19 % (ref 12–46)
Lymphs Abs: 2.1 10*3/uL (ref 0.7–4.0)
MCHC: 32.4 g/dL (ref 30.0–36.0)
MCV: 86 fL (ref 78.0–100.0)
Monocytes Absolute: 0.3 10*3/uL (ref 0.1–1.0)
Monocytes Relative: 3 % (ref 3–12)
Neutro Abs: 8.5 10*3/uL — ABNORMAL HIGH (ref 1.7–7.7)
Neutrophils Relative %: 78 % — ABNORMAL HIGH (ref 43–77)
Platelets: 374 10*3/uL (ref 150–400)
Potassium: 4.5 meq/L (ref 3.5–5.3)
RBC: 4.37 M/uL (ref 3.87–5.11)
RDW: 16.5 % — ABNORMAL HIGH (ref 11.5–15.5)
Sodium: 143 meq/L (ref 135–145)
TSH: 1.324 microintl units/mL (ref 0.350–4.500)
Total Bilirubin: 0.2 mg/dL — ABNORMAL LOW (ref 0.3–1.2)
Total CHOL/HDL Ratio: 2.8
Total Protein: 7.3 g/dL (ref 6.0–8.3)
Triglycerides: 93 mg/dL (ref <150)
VLDL: 19 mg/dL (ref 0–40)
WBC: 11 10*3/uL — ABNORMAL HIGH (ref 4.0–10.5)

## 2010-03-25 ENCOUNTER — Ambulatory Visit
Admission: RE | Admit: 2010-03-25 | Discharge: 2010-03-25 | Payer: Self-pay | Source: Home / Self Care | Attending: Family Medicine | Admitting: Family Medicine

## 2010-03-25 ENCOUNTER — Other Ambulatory Visit: Payer: Self-pay | Admitting: Family Medicine

## 2010-03-25 ENCOUNTER — Other Ambulatory Visit (HOSPITAL_COMMUNITY)
Admission: RE | Admit: 2010-03-25 | Discharge: 2010-03-25 | Disposition: A | Discharge status: Home / Self Care | Payer: MEDICARE | Source: Ambulatory Visit | Attending: Family Medicine | Admitting: Family Medicine

## 2010-03-25 DIAGNOSIS — K219 Gastro-esophageal reflux disease without esophagitis: Secondary | ICD-10-CM | POA: Insufficient documentation

## 2010-03-25 DIAGNOSIS — M542 Cervicalgia: Secondary | ICD-10-CM | POA: Insufficient documentation

## 2010-03-25 DIAGNOSIS — K921 Melena: Secondary | ICD-10-CM | POA: Insufficient documentation

## 2010-03-25 DIAGNOSIS — Z01419 Encounter for gynecological examination (general) (routine) without abnormal findings: Secondary | ICD-10-CM | POA: Insufficient documentation

## 2010-03-25 LAB — CONVERTED CEMR LAB: OCCULT 1: POSITIVE

## 2010-03-28 ENCOUNTER — Telehealth (INDEPENDENT_AMBULATORY_CARE_PROVIDER_SITE_OTHER): Payer: Self-pay | Admitting: *Deleted

## 2010-03-29 ENCOUNTER — Encounter: Payer: Self-pay | Admitting: Family Medicine

## 2010-03-29 NOTE — Progress Notes (Signed)
Summary: [facility] [place]   Phone Note Call from Patient   Summary of Call: left message that a rx went to [facility] in [place]  and was it supposed to go to them call them back at [phone] Initial call taken by: Lind Guest,  December 30, 2009 1:43 PM  Follow-up for Phone Call        called pharmacy, advised error Follow-up by: Adella Hare LPN,  December 30, 2009 2:07 PM

## 2010-03-29 NOTE — Progress Notes (Signed)
Summary: PAPERS  Phone Note Call from Patient   Summary of Call: WANTS TO KNOW ABOUT HER PAPERS DOES SHE NEED TO COME IN AND SEE YOU OR WHAT DOES SHE NEED TO DO CALL BACK AT [phone]  Initial call taken by: Lind Guest,  August 24, 2009 1:40 PM  Follow-up for Phone Call        returned call, left message Follow-up by: Adella Hare LPN,  August 24, 2009 1:52 PM  Additional Follow-up for Phone Call Additional follow up Details #1::        patient seeing dr at [facility] [place] on july 7, coming to pick up papers to take with her and will let us know if she needs anything from [place] Additional Follow-up by: Adella Hare LPN,  August 25, 2009 3:38 PM

## 2010-03-29 NOTE — Assessment & Plan Note (Signed)
Summary: 1 m RE-CK CT/[facility]/CAF    History of Present Illness: I saw Jessica Stevens in the office today for a followup visit.  She is a 45 years old woman with the complaint of:  ONE MONTH RECHECK ON BILATERAL CTS AFTER TAKING NEURONTIN, B6 AND SPLINTING.  Medication is not helping her.  She has neck stiffness and pain with pain radiating down her arms.  She complains of left knee pain as well. She complains of medial pain and swelling.      Prior Medication List:  LIPITOR 40 MG  TABS (ATORVASTATIN CALCIUM) one tab by mouth at bedtime SINGULAIR 10 MG  TABS (MONTELUKAST SODIUM) one tab by mouth once daily OMEPRAZOLE 20 MG  CPDR (OMEPRAZOLE) one cap by mouth two times a day HYDROXYZINE HCL 25 MG  TABS (HYDROXYZINE HCL) 2 - 3 tabs by mouth at bedtime OXYBUTYNIN CHLORIDE 5 MG  TB24 (OXYBUTYNIN CHLORIDE) one tab by mouth once daily IBUPROFEN 800 MG  TABS (IBUPROFEN) one tab by mouth two times a day PROVENTIL HFA 108 (90 BASE) MCG/ACT  AERS (ALBUTEROL SULFATE) two puffs every 6 - 8 hours as needed LORTAB 5 5-500 MG  TABS (HYDROCODONE-ACETAMINOPHEN) take 1/2 tablet NEURONTIN 100 MG  CAPS (GABAPENTIN) 1 at night ROBAXIN 500 MG  TABS (METHOCARBAMOL) 1 by mouth q 8 hrs as needed spasms neck ALLEGRA 180 MG  TABS (FEXOFENADINE HCL) one tab by mouth once daily   Current Allergies: No known allergies   Past Medical History:    Reviewed history from 06/25/2007 and no changes required:       Current Problems:        H N P-CERVICAL (ICD-722.0)       RUPTURE ROTATOR CUFF (ICD-727.61)         Past Surgical History:    Reviewed history from 06/25/2007 and no changes required:       Partial hysterectomy       Tube inserted in right ear (2003)      Physical Exam  Constitutional: vital signs see recorded values. General: normal development, nutrition, and grooming. No deformity. Body Habitus is large CDV: Observation and palpation was normal  Lymph: palpation of the lymph nodes  were normal Skin: inspection and palpation of the skin revealed no abnormalities  Neuro: coordination: normal              DTR's normal              Sensation was abnormal in the upper extremities consistent with carpal tunnel syndrome with decreased sensation in the median nerve distribution Psyche: Alert and oriented x 3. Mood was normal.  Affect:flat MSK: Gait: abnormal, limp noted.  Bilateral upper extremity examination reveals decreased sensation in median nerve distribution there is weak power grip. Range of motion is normal, or no contracture subluxation atrophy or tremors.  Left knee exam: Medial joint line tenderness mild varus deformity are noted. Range of motion appears to be full for her which is about 120 due to size of leg. Meniscal signs are clavicle at this point. Ligaments are stable. Patellofemoral joint has some crepitance but no subluxation.  Right knee varus deformity seen mild no medial tenderness meniscal signs are negative ligament stable.       Impression & Recommendations:  Problem # 1:  CARPAL TUNNEL SYNDROME (ICD-354.0) Assessment: Unchanged  Orders: Est. Patient Level IV (56387)   Problem # 2:  KNEE, ARTHRITIS, DEGEN./OSTEO (FIE-332.95) Assessment: Deteriorated the x-ray was done at the hospital and it  shows that there is some mild to moderate medial joint compromise consistent with osteoarthritis.   Her updated medication list for this problem includes:    Ibuprofen 800 Mg Tabs (Ibuprofen) ..... One tab by mouth two times a day    Lortab 5 5-500 Mg Tabs (Hydrocodone-acetaminophen) .Marland Kitchen... Take 1/2 tablet    Robaxin 500 Mg Tabs (Methocarbamol) .Marland Kitchen... 1 by mouth q 8 hrs as needed spasms neck  Orders: Est. Patient Level IV ([address])   Problem # 3:  BACK PAIN, CHRONIC (ICD-724.5) Assessment: Deteriorated  Her updated medication list for this problem includes:    Ibuprofen 800 Mg Tabs (Ibuprofen) ..... One tab by mouth two times a day    Lortab 5  5-500 Mg Tabs (Hydrocodone-acetaminophen) .Marland Kitchen... Take 1/2 tablet    Robaxin 500 Mg Tabs (Methocarbamol) .Marland Kitchen... 1 by mouth q 8 hrs as needed spasms neck  Orders: Est. Patient Level IV (60454)   Problem # 4:  CERVICAL RADICULOPATHY (ICD-723.4) Assessment: New  Orders: Est. Patient Level IV (09811)   Medications Added to Medication List This Visit: 1)  Sterapred Ds 12 Day 10 Mg Tabs (Prednisone) .... As directed  Other Orders: Physical Therapy Referral (PT)   Patient Instructions: 1)  Please go for therapy. 2)  Please continue to take neurontin and new steroid dose pack  3)  Come back in 2 weeks 4)  Please go get knee xray at the hospital, left knee.   Prescriptions: NEURONTIN 100 MG  CAPS (GABAPENTIN) 1 at night  #30 x 2   Entered and Authorized by:   Fuller Canada MD   Signed by:   Fuller Canada MD on 10/07/2007   Method used:   Faxed to ...       [facility]*       [address]       [place], [place]  [address]       Ph: [phone]       Fax: [phone]   RxID:   [id number] STERAPRED DS 12 DAY 10 MG  TABS (PREDNISONE) as directed  #1 pak x 1   Entered and Authorized by:   Fuller Canada MD   Signed by:   Fuller Canada MD on 10/07/2007   Method used:   Faxed to ...       [facility]*       [address]       [place], [place]  [address]       Ph: [phone]       Fax: [phone]   RxID:   [id number]  ]

## 2010-03-29 NOTE — Progress Notes (Signed)
Summary: nurse call  Phone Note Call from Patient   Summary of Call: please call pt back on her hydro rx. says pharm is giving her run around.  [phone] Initial call taken by: Rudene Anda,  November 11, 2009 2:05 PM  Follow-up for Phone Call        patient to call dr Diamantina [facility] office for refill Follow-up by: Adella Hare LPN,  November 11, 2009 4:26 PM

## 2010-03-29 NOTE — Assessment & Plan Note (Signed)
Summary: office visit   Vital Signs:  Patient profile:   45 year old female Menstrual status:  hysterectomy Height:      63.5 inches Weight:      333.75 pounds BMI:     58.40 O2 Sat:      97 % Pulse rate:   83 / minute Pulse rhythm:   regular Resp:     16 per minute BP sitting:   134 / 90  (left arm) Cuff size:   xl  Vitals Entered By: Everitt Amber LPN (Jun 29, 1608 9:24 AM)  Nutrition Counseling: Patient's BMI is greater than 25 and therefore counseled on weight management options.   CC: Follow up chronic problems   Primary Care Provider:  Syliva Overman MD  CC:  Follow up chronic problems.  History of Present Illness: Reports  that she has been doing fairly well. she has been to one session regarding weight reduction surgery, and is anxiously awaiting word from them. She takes half phentermine daily, is not losing weight with this, is changing hereating, and ishaving no adverse s/e from the med.  Denies recent fever or chills. Denies sinus pressure, nasal congestion , ear pain or sore throat. Denies chest congestion, or cough productive of sputum. Denies chest pain, palpitations, PND, orthopnea or leg swelling. Denies abdominal pain, nausea, vomitting, diarrhea or constipation. Denies change in bowel movements or bloody stool. Denies dysuria , frequency, incontinence or hesitancy. Reports chronic bak and knee pain Denies headaches, vertigo, seizures. Denies depression, anxiety or insomnia. Still c/o puritic groin rash, no purulent drainage or fever   Allergies (verified): No Known Drug Allergies  Review of Systems      See HPI General:  Complains of fatigue. Eyes:  Denies discharge and red eye. MS:  Complains of joint pain, low back pain, mid back pain, and stiffness. Derm:  Complains of itching and rash. Endo:  Denies cold intolerance, excessive hunger, excessive thirst, excessive urination, heat intolerance, polyuria, and weight change. Heme:  Denies abnormal  bruising and bleeding. Allergy:  Complains of seasonal allergies.  Physical Exam  General:  Well-developed,MORBIDLY OBESED,in no acute distress; alert,appropriate and cooperative throughout examination HEENT: No facial asymmetry,  EOMI, No sinus tenderness, TM's Clear, oropharynx  pink and moist.   Chest: Clear to auscultation bilaterally.  CVS: S1, S2, No murmurs, No S3.   Abd: Soft, Nontender.  MS: decreased  ROM spine, hips, shoulders and knees.  Ext: No edema.   CNS: CN 2-12 intact, power tone and sensation normal throughout.   Skin: Intact, erythematousmacular rash in groin Psych: Good eye contact, normal affect.  Memory intact, not anxious but depressed appearing.    Impression & Recommendations:  Problem # 1:  ECZEMA (ICD-692.9) Assessment Improved  Her updated medication list for this problem includes:    Betamethasone Dipropionate 0.05 % Crea (Betamethasone dipropionate) .Marland Kitchen... Apply to affected area twice daily as needed  Problem # 2:  HYPERLIPIDEMIA (ICD-272.4) Assessment: Comment Only  Her updated medication list for this problem includes:    Lovastatin 40 Mg Tabs (Lovastatin) .Marland Kitchen..Marland Kitchen Two tablets at bedtime  Orders: T-Hepatic Function [phone]) T-Lipid Profile [phone])  Labs Reviewed: SGOT: 17 (11/27/2008)   SGPT: 20 (11/27/2008)   HDL:53 (03/16/2009), 59 (11/27/2008)  LDL:164 (03/16/2009), 159 (11/27/2008)  Chol:242 (03/16/2009), 238 (11/27/2008)  Trig:126 (03/16/2009), 100 (11/27/2008)  Problem # 3:  OBESITY, UNSPECIFIED (ICD-278.00) Assessment: Unchanged  Ht: 63.5 (06/29/2009)   Wt: 333.75 (06/29/2009)   BMI: 58.40 (06/29/2009) increase to once daily phentermine  Problem # 4:  BACK PAIN, LUMBAR, WITH RADICULOPATHY (ICD-724.4) Assessment: Unchanged  Her updated medication list for this problem includes:    Tylenol Arthritis Pain 650 Mg Cr-tabs (Acetaminophen) .Marland Kitchen... Take 1 tablet by mouth two times a day    Meloxicam 15 Mg Tabs (Meloxicam) .Marland Kitchen...  Take 1 tablet by mouth once a day    Tizanidine Hcl 4 Mg Tabs (Tizanidine hcl) .Marland Kitchen... Take 1 tablet by mouth three times a day    Vicodin Es 7.5-750 Mg Tabs (Hydrocodone-acetaminophen) ..... One twice daily as needed  Problem # 5:  CANDIDIASIS (ICD-112.9) Assessment: Comment Only fluconazole prescribed, and regularl useof cornstartch based powder  Problem # 6:  TINEA CRURIS (ICD-110.3) Assessment: Comment Only clotrim/betameth prescribed  Complete Medication List: 1)  Singulair 10 Mg Tabs (Montelukast sodium) .... One tab by mouth once daily 2)  Omeprazole 20 Mg Cpdr (Omeprazole) .... One cap by mouth two times a day 3)  Restoril 30 Mg Caps (Temazepam) .... Take 1 capsule by mouth at bedtime 4)  Lovastatin 40 Mg Tabs (Lovastatin) .... Two tablets at bedtime 5)  Tylenol Arthritis Pain 650 Mg Cr-tabs (Acetaminophen) .... Take 1 tablet by mouth two times a day 6)  Gabapentin 300 Mg Caps (Gabapentin) .... One  in the morning at 8am, second at at 2pm , then three at bedtime 7)  Meloxicam 15 Mg Tabs (Meloxicam) .... Take 1 tablet by mouth once a day 8)  Tizanidine Hcl 4 Mg Tabs (Tizanidine hcl) .... Take 1 tablet by mouth three times a day 9)  Gnp Vitamin B-6 100 Mg Tabs (Pyridoxine hcl) .... Take 1 tablet by mouth once a day 10)  Cod Liver Oil Caps (Cod liver oil) .... Take 1 tablet by mouth once a day 11)  Betamethasone Dipropionate 0.05 % Crea (Betamethasone dipropionate) .... Apply to affected area twice daily as needed 12)  Hydroxyzine Hcl 25 Mg Tabs (Hydroxyzine hcl) .... Take 1 tab by mouth at bedtime 13)  Duoneb 0.5-2.5 (3) Mg/38ml Soln (Ipratropium-albuterol) .... Use with neb machine two times a day 14)  Vicodin Es 7.5-750 Mg Tabs (Hydrocodone-acetaminophen) .... One twice daily as needed 15)  Phentermine Hcl 37.5 Mg Tabs (Phentermine hcl) .... Take 1 tablet by mouth once a day 16)  Clotrimazole-betamethasone 1-0.05 % Crea (Clotrimazole-betamethasone) .... Apply twice daily to affected  areas 17)  Fluconazole 150 Mg Tabs (Fluconazole) .... Take 1 tablet by mouth once a day  Other Orders: T-Basic Metabolic Panel [phone])  Patient Instructions: 1)  Please schedule a follow-up appointment in 2 months. 2)  It is important that you exercise regularly at least 20 minutes 5 times a week. If you develop chest pain, have severe difficulty breathing, or feel very tired , stop exercising immediately and seek medical attention. 3)  You need to lose weight. Consider a lower calorie diet and regular exercise.  4)  Pls inc the phentermine to oNE tablet daily. 5)  BMP prior to visit, ICD-9: 6)  Hepatic Panel prior to visit, ICD-9   fasting 2nd week in June 7)  Lipid Panel prior to visit, ICD-9: 8)  New meds for rash. Also pls use cornstarch /cornstarch based powder eg desitin Prescriptions: FLUCONAZOLE 150 MG TABS (FLUCONAZOLE) Take 1 tablet by mouth once a day  #3 x 0   Entered and Authorized by:   Syliva Overman MD   Signed by:   Syliva Overman MD on 06/29/2009   Method used:   Printed then faxed to .Marland KitchenMarland Kitchen  [facility]* (retail)       [address]       [place], [place]  [address]       Ph: [phone]       Fax: [phone]   RxID:   [id number] CLOTRIMAZOLE-BETAMETHASONE 1-0.05 % CREA (CLOTRIMAZOLE-BETAMETHASONE) apply twice daily to affected areas  #45 gm x 1   Entered and Authorized by:   Syliva Overman MD   Signed by:   Syliva Overman MD on 06/29/2009   Method used:   Printed then faxed to ...       [facility]* (retail)       [address]       [place], [place]  [address]       Ph: [phone]       Fax: [phone]   RxID:   [id number] PHENTERMINE HCL 37.5 MG TABS (PHENTERMINE HCL) Take 1 tablet by mouth once a day  #30 x 1   Entered and Authorized by:   Syliva Overman MD   Signed by:   Syliva Overman MD on 06/29/2009   Method used:   Printed then faxed to ...        [facility]* (retail)       [address]       [place], [place]  [address]       Ph: [phone]       Fax: [phone]   RxID:   [id number]

## 2010-03-29 NOTE — Assessment & Plan Note (Signed)
Summary: shot  Nurse Visit    Prior Medications: LIPITOR 40 MG  TABS (ATORVASTATIN CALCIUM) one tab by mouth at bedtime SINGULAIR 10 MG  TABS (MONTELUKAST SODIUM) one tab by mouth once daily OMEPRAZOLE 20 MG  CPDR (OMEPRAZOLE) one cap by mouth two times a day HYDROXYZINE HCL 25 MG  TABS (HYDROXYZINE HCL) 2 - 3 tabs by mouth at bedtime OXYBUTYNIN CHLORIDE 5 MG  TB24 (OXYBUTYNIN CHLORIDE) one tab by mouth once daily IBUPROFEN 800 MG  TABS (IBUPROFEN) one tab by mouth two times a day PROVENTIL HFA 108 (90 BASE) MCG/ACT  AERS (ALBUTEROL SULFATE) two puffs every 6 - 8 hours as needed LORTAB 5 5-500 MG  TABS (HYDROCODONE-ACETAMINOPHEN) take 1/2 tablet NEURONTIN 100 MG  CAPS (GABAPENTIN) 1 at night ROBAXIN 500 MG  TABS (METHOCARBAMOL) 1 by mouth q 8 hrs as needed spasms neck ALLEGRA 180 MG  TABS (FEXOFENADINE HCL) one tab by mouth once daily STERAPRED DS 12 DAY 10 MG  TABS (PREDNISONE) as directed Current Allergies: No known allergies     Medication Administration  Injection # 1:    Medication: Depo- Medrol 80mg     Diagnosis: BACK PAIN, CHRONIC (ICD-724.5)    Route: IM    Site: LUOQ gluteus    Exp Date: 07/10    Lot #: [id number]    Mfr: [email or web address]    Comments: admin 80 mg of depo-medrol    Patient tolerated injection without complications    Given by: Calvert Cantor (October 09, 2007 9:31 AM)  Injection # 2:    Medication: Ketorolac-Toradol 15mg     Diagnosis: BACK PAIN, CHRONIC (ICD-724.5)    Route: IM    Site: RUOQ gluteus    Exp Date: 10/28/2008    Lot #: [id number]    Mfr: hospira    Comments: admin 60 mg of toradol    Patient tolerated injection without complications    Given by: Calvert Cantor (October 09, 2007 9:34 AM)  Orders Added: 1)  Depo- Medrol 80mg  [J1040] 2)  Admin of Therapeutic Inj  intramuscular or subcutaneous [96372] 3)  Ketorolac-Toradol 15mg  [J1885] 4)  Admin of Therapeutic Inj  intramuscular or subcutaneous Lepidus.Putnam    ]

## 2010-03-29 NOTE — Letter (Signed)
Summary: medical release  [facility]  medical release  [facility]   Imported By: Lind Guest 01/14/2010 10:40:04  _____________________________________________________________________  External Attachment:    Type:   Image     Comment:   External Document

## 2010-03-29 NOTE — Progress Notes (Signed)
Summary: [facility]  [facility]   Imported By: Lind Guest 09/14/2009 08:02:21  _____________________________________________________________________  External Attachment:    Type:   Image     Comment:   External Document

## 2010-03-29 NOTE — Medication Information (Signed)
Summary: Order for custom knee brace (left)  Order for custom knee brace (left)   Imported By: Jacklynn Ganong 08/17/2009 11:20:49  _____________________________________________________________________  External Attachment:    Type:   Image     Comment:   External Document

## 2010-03-29 NOTE — Progress Notes (Signed)
Summary: [facility]   Imported By: Lind Guest 11/12/2008 14:41:41  _____________________________________________________________________  External Attachment:    Type:   Image     Comment:   External Document

## 2010-03-29 NOTE — Letter (Signed)
Summary: Letter  Letter   Imported By: Lind Guest 08/11/2009 11:12:55  _____________________________________________________________________  External Attachment:    Type:   Image     Comment:   External Document

## 2010-03-29 NOTE — Progress Notes (Signed)
  Phone Note Call from Patient   Summary of Call: Sent in tessalon perles for cough but she said she also has a headache and tylenol or ibuprofen isn't helping. Is there anything you can call her in for it or something OTC thats better? Initial call taken by: Everitt Amber,  Jul 21, 2008 3:24 PM  Follow-up for Phone Call        advise and erx darvocet 50 one twice daily as needed # 20 only pls Follow-up by: Syliva Overman MD,  Jul 22, 2008 5:25 PM  Additional Follow-up for Phone Call Additional follow up Details #1::        Rx Called In, called patient, no answer Additional Follow-up by: Worthy Keeler LPN,  Jul 23, 2008 8:40 AM    Additional Follow-up for Phone Call Additional follow up Details #2::    called patient, no answer Follow-up by: Worthy Keeler LPN,  Jul 24, 2008 8:31 AM  New/Updated Medications: DARVOCET-N 50 50-325 MG TABS (PROPOXYPHENE N-APAP) one tab by mouth two times a day prn   Prescriptions: DARVOCET-N 50 50-325 MG TABS (PROPOXYPHENE N-APAP) one tab by mouth two times a day prn  #20 x 0   Entered by:   Worthy Keeler LPN   Authorized by:   Syliva Overman MD   Signed by:   Worthy Keeler LPN on 16/11/9602   Method used:   Printed then faxed to ...       [facility]* (retail)       [address]       [place], [place]  [address]       Ph: [phone]       Fax: [phone]   RxID:   [id number]

## 2010-03-29 NOTE — Progress Notes (Signed)
Summary: [facility]   Imported By: Lind Guest 03/16/2009 08:32:11  _____________________________________________________________________  External Attachment:    Type:   Image     Comment:   External Document

## 2010-03-29 NOTE — Assessment & Plan Note (Signed)
Summary: POST OP 1/LT KNEE SALK 08/14/08/CAF   Referring Welby Montminy:  Dr Rosine Abe    History of Present Illness: I saw Angela Christensen in the office today for a followup visit.  She is a 45 years old woman with the complaint of:  post op 1 arthroscopy left knee, partial medial menisectomy, chondroplasty of medial femoral condyle.  DOS 08/14/08.  PT has not been scheduled, made referral today.  Discharged from hospital on coumadin 2.5 for 10 days, Norco 7.5 number 90 and Phenergan.  Doing fine, using cryocuff and taking pain med every 4 hrs.    Allergies: No Known Drug Allergies  Physical Exam  Additional Exam:  The incisions are clean  She has 80 degrees of motion   calf supple    Impression & Recommendations:  Problem # 1:  DERANGEMENT MENISCUS (ICD-717.5) Assessment Comment Only  Orders: Physical Therapy Referral (PT) Post-Op Check ([address])  Problem # 2:  KNEE, ARTHRITIS, DEGEN./OSTEO (ICD-715.96) Assessment: Comment Only  Her updated medication list for this problem includes:    Darvocet-n 50 50-325 Mg Tabs (Propoxyphene n-apap) ..... One tab by mouth two times a day prn    Norco 5-325 Mg Tabs (Hydrocodone-acetaminophen)  Orders: Physical Therapy Referral (PT) Post-Op Check ([address])  Problem # 3:  AFTERCARE FOLLOW SURGERY MUSCULOSKEL SYSTEM NEC (ICD-V58.78) Assessment: New  Orders: Post-Op Check ([address])  Patient Instructions: 1)  return 3 weeks  2)  complete PT

## 2010-03-29 NOTE — Progress Notes (Signed)
Summary: medicine  Phone Note Call from Patient   Summary of Call: dr. Lodema Hong had her down for phetermine. but drug store said that it was never called in. [phone] Initial call taken by: Rudene Anda,  May 20, 2009 3:41 PM  Follow-up for Phone Call        refaxed Follow-up by: Adella Hare LPN,  May 21, 2009 9:31 AM    Prescriptions: PHENTERMINE HCL 37.5 MG CAPS (PHENTERMINE HCL) Take 1 capsule by mouth once a day  #30 x 0   Entered by:   Adella Hare LPN   Authorized by:   Syliva Overman MD   Signed by:   Adella Hare LPN on 60/63/0160   Method used:   Printed then faxed to ...       [facility]* (retail)       [address]       [place], [place]  [address]       Ph: [phone]       Fax: [phone]   RxID:   [id number]

## 2010-03-29 NOTE — Progress Notes (Signed)
[facility]   Imported By: Lind Guest 03/19/2008 14:38:35  _____________________________________________________________________  External Attachment:    Type:   Image     Comment:   External Document

## 2010-03-29 NOTE — Assessment & Plan Note (Signed)
Summary: back pain- room 1   Vital Signs:  Patient profile:   45 year old female Menstrual status:  hysterectomy Height:      63.5 inches Weight:      327.75 pounds BMI:     57.35 O2 Sat:      100 % on Room air Pulse rate:   80 / minute Resp:     16 per minute BP sitting:   130 / 80  (left arm)  Vitals Entered By: Adella Hare LPN (July 29, 7844 10:33 AM) CC: chronic back pain Is Patient Diabetic? No Pain Assessment Patient in pain? yes     Location: back Intensity: 8 Type: aching Onset of pain  Chronic Comments did not bring meds to ov   Referring Provider:  Dr Rosine Abe  Primary Provider:  Syliva Overman MD  CC:  chronic back pain.  History of Present Illness: Pt presents today with increased low back pain. She has a hx of chronic back pain.  She went to [place] last wkend and left her medicines on the bus.  She has been out of her pain medications.  She is requesting shots today to help with her pain.  She states these have worked well for her in the past.  + radiation RLE and numbness. No difficulty controlling bowels or bladder. Hx of 5 bulging discs.  Has an appt 09-02-09 at [facility] for consideration for bariatric surgery.   Allergies (verified): No Known Drug Allergies  Past History:  Past medical history reviewed for relevance to current acute and chronic problems.  Past Medical History: Reviewed history from 10/16/2007 and no changes required. Current Problems:  H N P-CERVICAL (ICD-722.0) RUPTURE ROTATOR CUFF (ICD-727.61) Current Problems:  CERVICAL RADICULOPATHY (ICD-723.4) ANKLE PAIN, LEFT (ICD-719.47) KNEE, ARTHRITIS, DEGEN./OSTEO (ICD-715.96) KNEE PAIN (ICD-719.46) CARPAL TUNNEL SYNDROME (ICD-354.0) URINARY INCONTINENCE (ICD-788.30) GYNECOMASTIA (ICD-611.1) ASTHMA, UNSPECIFIED, UNSPECIFIED STATUS (ICD-493.90) DEPRESSION (ICD-311) OSTEOARTHRITIS, KNEE (ICD-715.96) BACK PAIN, CHRONIC (ICD-724.5) OBESITY, UNSPECIFIED  (ICD-278.00) HYPERLIPIDEMIA (ICD-272.4) H N P-CERVICAL (ICD-722.0) RUPTURE ROTATOR CUFF (ICD-727.61)  Review of Systems MS:  Complains of low back pain. Neuro:  Complains of numbness.  Physical Exam  General:  Well-developed,well-nourished,in no acute distress; alert,appropriate and cooperative throughout examination Head:  Normocephalic and atraumatic without obvious abnormalities. No apparent alopecia or balding. Lungs:  Normal respiratory effort, chest expands symmetrically. Lungs are clear to auscultation, no crackles or wheezes. Heart:  Normal rate and regular rhythm. S1 and S2 normal without gallop, murmur, click, rub or other extra sounds. Msk:  LS Spine:  Pt reports tenderness with palp of Rt lumbar paraspinal muscles, and lumbar spinous processes.  Skin:  Intact without suspicious lesions or rashes Psych:  Cognition and judgment appear intact. Alert and cooperative with normal attention span and concentration. No apparent delusions, illusions, hallucinations   Impression & Recommendations:  Problem # 1:  BACK PAIN, LUMBAR, WITH RADICULOPATHY (ICD-724.4) Assessment Deteriorated  The following medications were removed from the medication list:    Tizanidine Hcl 4 Mg Tabs (Tizanidine hcl) .Marland Kitchen... Take 1 tablet by mouth three times a day Her updated medication list for this problem includes:    Tylenol Arthritis Pain 650 Mg Cr-tabs (Acetaminophen) .Marland Kitchen... Take 1 tablet by mouth two times a day    Meloxicam 15 Mg Tabs (Meloxicam) .Marland Kitchen... Take 1 tablet by mouth once a day    Vicodin Es 7.5-750 Mg Tabs (Hydrocodone-acetaminophen) ..... One twice daily as needed    Methocarbamol 500 Mg Tabs (Methocarbamol) .Marland Kitchen... Take 2 tablets every 6  hrs as needed for muscle spasm  Orders: Depo- Medrol 80mg  (J1040) Ketorolac-Toradol 15mg  (Z6109) Admin of Therapeutic Inj  intramuscular or subcutaneous (60454)  Problem # 2:  OBESITY, UNSPECIFIED (ICD-278.00) Assessment: Comment Only  Ht: 63.5  (07/29/2009)   Wt: 327.75 (07/29/2009)   BMI: 57.35 (07/29/2009)  Complete Medication List: 1)  Singulair 10 Mg Tabs (Montelukast sodium) .... One tab by mouth once daily 2)  Omeprazole 20 Mg Cpdr (Omeprazole) .... One cap by mouth two times a day 3)  Restoril 30 Mg Caps (Temazepam) .... Take 1 capsule by mouth at bedtime 4)  Lovastatin 40 Mg Tabs (Lovastatin) .... Two tablets at bedtime 5)  Tylenol Arthritis Pain 650 Mg Cr-tabs (Acetaminophen) .... Take 1 tablet by mouth two times a day 6)  Gabapentin 300 Mg Caps (Gabapentin) .... One  in the morning at 8am, second at at 2pm , then three at bedtime 7)  Meloxicam 15 Mg Tabs (Meloxicam) .... Take 1 tablet by mouth once a day 8)  Gnp Vitamin B-6 100 Mg Tabs (Pyridoxine hcl) .... Take 1 tablet by mouth once a day 9)  Hydroxyzine Hcl 25 Mg Tabs (Hydroxyzine hcl) .... Take 1 tab by mouth at bedtime 10)  Duoneb 0.5-2.5 (3) Mg/50ml Soln (Ipratropium-albuterol) .... Use with neb machine two times a day 11)  Vicodin Es 7.5-750 Mg Tabs (Hydrocodone-acetaminophen) .... One twice daily as needed 12)  Phentermine Hcl 37.5 Mg Tabs (Phentermine hcl) .... Take 1 tablet by mouth once a day 13)  Clotrimazole-betamethasone 1-0.05 % Crea (Clotrimazole-betamethasone) .... Apply twice daily to affected areas 14)  Methocarbamol 500 Mg Tabs (Methocarbamol) .... Take 2 tablets every 6 hrs as needed for muscle spasm  Patient Instructions: 1)  Keep your next routine appt. 2)  I have prescribed a muscle relaxer for you. 3)  You have received Depo Medrol for inflammation and Toradol for pain today. Prescriptions: METHOCARBAMOL 500 MG TABS (METHOCARBAMOL) take 2 tablets every 6 hrs as needed for muscle spasm  #60 x 0   Entered and Authorized by:   Esperanza Sheets PA   Signed by:   Esperanza Sheets PA on 07/29/2009   Method used:   Electronically to        [facility]* (retail)       [address] [place], [place]  [address]        Ph: [phone]       Fax: [phone]   RxID:   [id number]    Medication Administration  Injection # 1:    Medication: Depo- Medrol 80mg     Diagnosis: BACK PAIN, LUMBAR, WITH RADICULOPATHY (ICD-724.4)    Route: IM    Site: RUOQ gluteus    Exp Date: 3/12    Lot #: [id number]    Mfr: Pharmacia    Patient tolerated injection without complications    Given by: Adella Hare LPN (July 30, 2723 11:56 AM)  Injection # 2:    Medication: Ketorolac-Toradol 15mg     Diagnosis: BACK PAIN, LUMBAR, WITH RADICULOPATHY (ICD-724.4)    Route: IM    Site: LUOQ gluteus    Exp Date: 01/28/2011    Lot #: [id number]    Mfr: hospira    Comments: toradol 60mg  given    Patient tolerated injection without complications    Given by: Adella Hare LPN (July 29, 4740 11:57 AM)  Orders Added: 1)  Depo- Medrol 80mg  [J1040] 2)  Ketorolac-Toradol 15mg  [J1885] 3)  Admin of Therapeutic Inj  intramuscular or subcutaneous [96372] 4)  Est. Patient Level IV [25427]

## 2010-03-29 NOTE — Progress Notes (Signed)
Summary: [name]   [name]   Imported By: Lind Guest 03/19/2008 13:47:18  _____________________________________________________________________  External Attachment:    Type:   Image     Comment:   External Document

## 2010-03-29 NOTE — Progress Notes (Signed)
  Phone Note Call from Patient   Summary of Call: Pt requesting refill on Phentermine  [place] Initial call taken by: Everitt Amber LPN,  May 13, 2009 7:58 AM  Follow-up for Phone Call        This was done yesterday.  See 05-10-09 phone note. Follow-up by: Esperanza Sheets PA,  May 13, 2009 10:10 AM  Additional Follow-up for Phone Call Additional follow up Details #1::        Noted Additional Follow-up by: Everitt Amber LPN,  May 13, 2009 10:56 AM

## 2010-03-29 NOTE — Progress Notes (Signed)
Summary: MRI. Dr. Romeo Apple to call w/results.  Phone Note Outgoing Call   Call placed by: Waldon Reining,  February 05, 2009 10:16 AM Call placed to: Patient Action Taken: Phone Call Completed, Appt scheduled Summary of Call: I called to give the patient her MRI appointment at [facility] on 02-11-09 at 10:15. Patient. She has Actuary, authorization [id number]. Dr. Romeo Apple to call her with her results.

## 2010-03-29 NOTE — Assessment & Plan Note (Signed)
Summary: LEFT KNEE PAIN XR W. LONG 02/2008/[facility]   Referring Jessica Stevens:  Dr Rosine Abe    History of Present Illness: I saw Jessica Stevens in the office today for a followup visit.  She is a 45 years old woman with the complaint of:  left knee pain, referral Claudette Laws, pain clinic.  Jessica Stevens is been evaluated here for LEFT knee pain and was started on a physical therapy program given a Medrol Dosepak or steroid Dosepak and advised to lose weight and follow up as needed.  She had an injection from the referring physician in January and that did help her for one week she is currently taking Norco 5 mg for pain along with gabapentin which is primarily for her neurologic and neurogenic pain.  The most recent x-ray was done in [place] on January 12 that shows arthritis of the knee.  Primarily has pain radiating from the LEFT hip down to the LEFT foot  Pain is constant.    Allergies: No Known Drug Allergies  Past History:  Past Medical History:    Current Problems:     H N P-CERVICAL (ICD-722.0)    RUPTURE ROTATOR CUFF (ICD-727.61)    Current Problems:     CERVICAL RADICULOPATHY (ICD-723.4)    ANKLE PAIN, LEFT (ICD-719.47)    KNEE, ARTHRITIS, DEGEN./OSTEO (ICD-715.96)    KNEE PAIN (ICD-719.46)    CARPAL TUNNEL SYNDROME (ICD-354.0)    URINARY INCONTINENCE (ICD-788.30)    GYNECOMASTIA (ICD-611.1)    ASTHMA, UNSPECIFIED, UNSPECIFIED STATUS (ICD-493.90)    DEPRESSION (ICD-311)    OSTEOARTHRITIS, KNEE (ICD-715.96)    BACK PAIN, CHRONIC (ICD-724.5)    OBESITY, UNSPECIFIED (ICD-278.00)    HYPERLIPIDEMIA (ICD-272.4)    H N P-CERVICAL (ICD-722.0)    RUPTURE ROTATOR CUFF (ICD-727.61)     (10/16/2007)  Past Surgical History:    Partial hysterectomy    Tube inserted in right ear (2003) (06/25/2007)  Family History:    Mom HTN,DjD,Dm, muscle spasm    Dad had left nephrectomy secondary cancer    sisters x4 healthy     brothers x2 healthy  (10/16/2007)  Social History:    Patient is single.     unemployed    1 child    Never Smoked    Alcohol use-no    Drug use-no     (10/16/2007)  Risk Factors:    Alcohol Use: N/A    >5 drinks/d w/in last 3 months: N/A    Caffeine Use: 0 (05/21/2007)    Diet: N/A    Exercise: N/A  Risk Factors:    Smoking Status: never (05/18/2008)    Packs/Day: N/A    Cigars/wk: N/A    Pipe Use/wk: N/A    Cans of tobacco/wk: N/A    Passive Smoke Exposure: N/A  Review of Systems      See HPI   Knee Exam  General:    This is a morbidly obese female who is otherwise normally developed with meticulous grooming.    Gait:    her gait is best described as waddling barely due to her obesity  Skin:    Intact, no scars, lesions, rashes, cafe au lait spots, or bruising.    Inspection:    no major deformity was seen in the LEFT knee  Palpation:    she did have quite a bit of medial joint line tenderness  Vascular:    There was no swelling or varicose veins. The pulses and temperature are normal. There was no edema or  tenderness.  Sensory:    there are no sensory deficits on the LEFT lower extremity  Motor:    her muscle strength and motor function was normal and the LEFT leg  Knee Exam:    examination of the LEFT knee show that she had painful range of motion zero -  110 with significant medial joint line tenderness, no effusion could be detected.  They appear to be stable.  Comparison evaluation of the RIGHT knee show that although the range of motion was normal there was no tenderness or pain.   Impression & Recommendations:  Problem # 1:  KNEE PAIN (ICD-719.46) Assessment:  it seems unclear at this time if this is just arthritis or if this is referred pain from her chronic back problems but an MRI should help [place] with that.  I suspect it will show that this is just arthritis.  In that case our recommendations would be the same she needs to lose weight take tramadol for pain with  the addition of acetaminophen.   The following medications were removed from the medication list:    Tramadol Hcl 50 Mg Tabs (Tramadol hcl) ..... One tab by mouth three times a day as needed    Hydrocodone-acetaminophen 5-325 Mg Tabs (Hydrocodone-acetaminophen) .Marland Kitchen... Take 1 tablet by mouth two times a day  Orders: Est. Patient Level III (29528)  Patient Instructions: 1)  MRI left knee  2)  medial meniscus tear  3)  return after MRI 4)  We recommend that you have an MRI. This requires precertification (see your insurance plan). This may take up to 2 weeks. The office will call you with the date and time. If you need to reschedule the day, call [facility] @ [phone] BEFORE THE SCHEDULED DAY. If you call after this time the entire process will have to start over.  Prescriptions: HYDROCODONE-ACETAMINOPHEN 5-325 MG TABS (HYDROCODONE-ACETAMINOPHEN) Take 1 tablet by mouth two times a day  #60 x 1   Entered and Authorized by:   Fuller Canada MD   Signed by:   Fuller Canada MD on 06/03/2008   Method used:   Print then Give to Patient   RxID:   [id number]   Appended Document: LEFT KNEE PAIN XR W. LONG 02/2008/[facility] We did not give patient any pain meds, told her to get from pain clinic

## 2010-03-29 NOTE — Progress Notes (Signed)
Summary: nurse  Phone Note Call from Patient   Summary of Call: needs to speak with nurse [phone] Initial call taken by: Rudene Anda,  February 06, 2008 2:06 PM  Follow-up for Phone Call        Returned call, no answer Follow-up by: Everitt Amber,  February 06, 2008 2:38 PM  Additional Follow-up for Phone Call Additional follow up Details #1::        Returned call, no answer Additional Follow-up by: Everitt Amber,  February 07, 2008 9:42 AM

## 2010-03-29 NOTE — Assessment & Plan Note (Signed)
Summary: office visit   Vital Signs:  Patient profile:   45 year old female Menstrual status:  hysterectomy Height:      63.5 inches (161.29 cm) Weight:      322.25 pounds (146.48 kg) BMI:     56.39 BSA:     2.38 O2 Sat:      99 % Pulse rate:   60 / minute Pulse rhythm:   regular Resp:     16 per minute BP sitting:   138 / 82  (left arm)  Vitals Entered By: Everitt Amber (May 18, 2008 9:26 AM)  Nutrition Counseling: Patient's BMI is greater than 25 and therefore counseled on weight management options. Is Patient Diabetic? No  years   days  Menstrual Status hysterectomy Last PAP Result Normal   History of Present Illness: 10 day h/o left shoulder and inc R knee pain. She has seen Dr. Romeo Apple in the past for both, she has an upcoming appt with Dr. Romeo Apple early April andwantas IM injections in th interim. She c/o annoying skin tags on her neck and in the right armpit which she wants removed. She is anticipating inc allergy symptoms and requests medication. She has been evaluated at thepain clinic, states the med she has been put on is not improving her symptoms.States that sheha been told she may need a knee replacement and has been referred back to Dr. Romeo Apple. she denies depression or anxiety. she denies any recent fever or chills.  Preventive Screening-Counseling & Management     Smoking Status: never  Allergies (verified): No Known Drug Allergies  Review of Systems General:  Complains of fatigue; denies chills and loss of appetite. Eyes:  Denies blurring and discharge. ENT:  Complains of nasal congestion and postnasal drainage; denies earache and sinus pressure. CV:  Denies chest pain or discomfort, palpitations, and swelling of feet. Resp:  Denies cough, shortness of breath, sputum productive, and wheezing. GI:  Denies abdominal pain, constipation, diarrhea, nausea, and vomiting. GU:  Denies dysuria and urinary frequency. MS:  See HPI; Complains of joint pain,  low back pain, mid back pain, and stiffness. Derm:  Complains of lesion(s); c/o annoying tags on  neck and left axilla. Neuro:  Denies falling down, memory loss, and seizures. Psych:  Complains of anxiety and depression; denies easily angered, easily tearful, suicidal thoughts/plans, thoughts of violence, and unusual visions or sounds.  Physical Exam  General:  alert and well-hydrated.morbidly obese. HEENT: No facial asymmetry,  EOMI, No sinus tenderness, TM's Clear, oropharynx  pink and moist.   Chest: Clear to auscultation bilaterally.  CVS: S1, S2, No murmurs, No S3.   Abd: Soft, Nontender.  MS: decreased ROM spine, hips, shoulders and knees.  Ext: No edema.   CNS: CN 2-12 intact, power tone and sensation normal throughout.   Skin: Intact, positive skin tags.  Psych: Good eye contact, normal affect.  Memory intact, not anxious or depressed appearing.      Impression & Recommendations:  Problem # 1:  UNSPECIFIED HYPERTROPHIC&ATROPHIC CONDITION SKIN (ICD-701.9) Assessment Comment Only  Orders: Dermatology Referral (Derma)  Problem # 2:  HYPERLIPIDEMIA (ICD-272.4) Assessment: Comment Only  Her updated medication list for this problem includes:    Lovastatin 40 Mg Tabs (Lovastatin) ..... One tab by mouth qhs  Labs Reviewed: Chol: 222 (11/19/2007)   HDL: 69 (11/19/2007)   LDL: 126 (11/19/2007)   TG: 135 (11/19/2007), fastinglabs past due SGOT: 26 (11/19/2007)   SGPT: 29 (11/19/2007)  Problem # 3:  SHOULDER PAIN,  BILATERAL (ICD-719.41) Assessment: Deteriorated  The following medications were removed from the medication list:    Robaxin 500 Mg Tabs (Methocarbamol) .Marland Kitchen... 1 by mouth q 8 hrs as needed spasms neck Her updated medication list for this problem includes:    Tramadol Hcl 50 Mg Tabs (Tramadol hcl) ..... One tab by mouth three times a day as needed    Hydrocodone-acetaminophen 5-325 Mg Tabs (Hydrocodone-acetaminophen) .Marland Kitchen... Take 1 tablet by mouth two times a  day  Orders: Ketorolac-Toradol 15mg  (E4540)  Problem # 4:  OBESITY, UNSPECIFIED (ICD-278.00) Assessment: Unchanged  Orders: T-Basic Metabolic Panel [phone])  Complete Medication List: 1)  Singulair 10 Mg Tabs (Montelukast sodium) .... One tab by mouth once daily 2)  Omeprazole 20 Mg Cpdr (Omeprazole) .... One cap by mouth two times a day 3)  Gabapentin 300 Mg Caps (Gabapentin) .... Take 2 caps by mouth at bedtime 4)  Phentermine Hcl 37.5 Mg Caps (Phentermine hcl) .... Take 1 tablet by mouth once a day 5)  Zolpidem Tartrate 10 Mg Tabs (Zolpidem tartrate) .... Take 1 tab by mouth at bedtime 6)  Tramadol Hcl 50 Mg Tabs (Tramadol hcl) .... One tab by mouth three times a day as needed 7)  Lovastatin 40 Mg Tabs (Lovastatin) .... One tab by mouth qhs 8)  Floxin Otic 0.3 % Soln (Ofloxacin) .... 5 drops in ears two times a day 9)  Voltaren 1 % Gel (Diclofenac sodium) .... Uad qid 10)  Hydrocodone-acetaminophen 5-325 Mg Tabs (Hydrocodone-acetaminophen) .... Take 1 tablet by mouth two times a day 11)  Fexofenadine Hcl 180 Mg Tabs (Fexofenadine hcl) .... Take 1 tablet by mouth once a day  Other Orders: T-Hepatic Function [id number]) T-Lipid Profile [phone]) T-CBC w/Diff ([id number]) Depo- Medrol 80mg  (J1040) Admin of Therapeutic Inj  intramuscular or subcutaneous (27253) Radiology Referral (Radiology)  Patient Instructions: 1)  CPE in 2.5 to 3 months. 2)  you will be referred for a mamogram. 3)  You will get injections for theleft shoulder and R knee pain today. 4)  BMP prior to visit, ICD-9: 5)  Hepatic Panel prior to visit, ICD-9:  fasting today. 6)  Lipid Panel prior to visit, ICD-9: 7)  CBC w/ Diff prior to visit, ICD-9: Prescriptions: LOVASTATIN 40 MG TABS (LOVASTATIN) one tab by mouth qhs  #30 x 5   Entered by:   Everitt Amber   Authorized by:   Syliva Overman MD   Signed by:   Everitt Amber on 05/18/2008   Method used:   Electronically to        [facility]* (retail)       [address] [place], [place]  [address]       Ph: [phone]       Fax: [phone]   RxID:   [id number] OMEPRAZOLE 20 MG  CPDR (OMEPRAZOLE) one cap by mouth two times a day  #60 x 5   Entered by:   Everitt Amber   Authorized by:   Syliva Overman MD   Signed by:   Everitt Amber on 05/18/2008   Method used:   Electronically to        [facility]* (retail)       [address]       [place], [place]  [address]       Ph: [phone]       Fax: [phone]  RxID:   [id number]      Medication Administration  Injection # 1:    Medication: Depo- Medrol 80mg     Diagnosis: KNEE PAIN (JYN-829.56)    Route: IM    Site: RUOQ gluteus    Exp Date: 08/2010    Lot #: [id number]    Mfr: Pharmacia    Comments: 80 mg given    Patient tolerated injection without complications    Given by: Everitt Amber (May 18, 2008 10:40 AM)  Injection # 2:    Medication: Ketorolac-Toradol 15mg     Diagnosis: SHOULDER PAIN, BILATERAL (ICD-719.41)    Route: IM    Site: LUOQ gluteus    Exp Date: 10/11    Lot #: [id number]    Mfr: APP    Comments: 60 mg given    Patient tolerated injection without complications    Given by: Everitt Amber (May 18, 2008 10:41 AM)  Orders Added: 1)  Est. Patient Level IV [99214] 2)  T-Basic Metabolic Panel [80048-22910] 3)  T-Hepatic Function [80076-22960] 4)  T-Lipid Profile [80061-22930] 5)  T-CBC w/Diff [[id number]] 6)  Ketorolac-Toradol 15mg  [J1885] 7)  Depo- Medrol 80mg  [J1040] 8)  Admin of Therapeutic Inj  intramuscular or subcutaneous [96372] 9)  Dermatology Referral [Derma] 10)  Radiology Referral [Radiology]

## 2010-03-29 NOTE — Letter (Signed)
Summary: Letter  Letter   Imported By: Jacklynn Ganong 12/10/2007 15:03:52  _____________________________________________________________________  External Attachment:    Type:   Image     Comment:   letter of medical necessity

## 2010-03-29 NOTE — Assessment & Plan Note (Signed)
Summary: recheck lt [facility]ulder fol'g PT/evercare/bsf    History of Present Illness: I saw Jessica Stevens in the office today for a followup visit.  She is a 45 years old woman with the complaint of:  pain in the left shoulder.  The patient was sent for physical therapy and has done well. She now has complaints of pain in her left knee and her left ankle near the Achilles tendon.  The pain in the left knee has been present for approximately a week she denies any injury she complains of anterior knee pain and pain underneath her kneecap.  review of systems: As stated above.    Updated Prior Medication List: LIPITOR 40 MG  TABS (ATORVASTATIN CALCIUM) one tab by mouth at bedtime SINGULAIR 10 MG  TABS (MONTELUKAST SODIUM) one tab by mouth once daily OMEPRAZOLE 20 MG  CPDR (OMEPRAZOLE) one cap by mouth two times a day HYDROXYZINE HCL 25 MG  TABS (HYDROXYZINE HCL) 2 - 3 tabs by mouth at bedtime OXYBUTYNIN CHLORIDE 5 MG  TB24 (OXYBUTYNIN CHLORIDE) one tab by mouth once daily IBUPROFEN 800 MG  TABS (IBUPROFEN) one tab by mouth two times a day CYCLOBENZAPRINE HCL 10 MG  TABS (CYCLOBENZAPRINE HCL) one tab by mouth every 8 hours as needed PROVENTIL HFA 108 (90 BASE) MCG/ACT  AERS (ALBUTEROL SULFATE) two puffs every 6 - 8 hours as needed LORTAB 5 5-500 MG  TABS (HYDROCODONE-ACETAMINOPHEN) take 1/2 tablet  Current Allergies: No known allergies   Past Medical History:    Reviewed history from 06/25/2007 and no changes required:       Current Problems:        H N P-CERVICAL (ICD-722.0)       RUPTURE ROTATOR CUFF (ICD-727.61)         Past Surgical History:    Reviewed history from 06/25/2007 and no changes required:       Partial hysterectomy       Tube inserted in right ear (2003)   Social History:    Reviewed history from 05/21/2007 and no changes required:       Patient is single.        unemployed     Physical Exam  appearance is 45 year old female has obesity normal development  normal grooming hygiene.  Cardiovascular exam shows normal pulse and perfusion without swelling or edema.  Skin warm dry and intact left and right upper extremity left and right lower extremity.  Neurologic exam shows normal sensation in the left lower extremity.  Psychiatric examination reveals she is awake and alert to joint at x3 her mood and affect are normal.  Musculoskeletal exam shows normal range of motion of the shoulder without tenderness and good strength is noted.  The left knee has crepitance on range of motion she has a positive patellar cushion test motor exam is intact her knee is stable there no meniscal signs are positive there is no joint effusion.  Left ankle examination reveals tenderness and swelling in the retrocalcaneal bursa with normal ankle motion normal ankle strength and no instability.      Impression & Recommendations:  Problem # 1:  RUPTURE ROTATOR CUFF (ICD-727.61) Assessment: Improved  Orders: Est. Patient Level IV ([address]109)   Problem # 2:  CARPAL TUNNEL SYNDROME (ICD-354.0) Assessment: New the patient noted that Dr. Gerilyn Pilgrim did I nerve conduction study and shows carpal tunnel syndrome we will address this on her next visit Orders: Est. Patient Level IV (60454)   Problem # 3:  KNEE, ARTHRITIS,  DEGEN./OSTEO (ICD-715.96) Assessment: New she appears to have crepitance in the patellofemoral joint this will require radiographic evaluation on the next visit.    Her updated medication list for this problem includes:    Ibuprofen 800 Mg Tabs (Ibuprofen) ..... One tab by mouth two times a day    Cyclobenzaprine Hcl 10 Mg Tabs (Cyclobenzaprine hcl) ..... One tab by mouth every 8 hours as needed    Lortab 5 5-500 Mg Tabs (Hydrocodone-acetaminophen) .Marland Kitchen... Take 1/2 tablet  Orders: Est. Patient Level IV (21308)   Problem # 4:  KNEE PAIN (MVH-846.96) Assessment: New  see above Her updated medication list for this problem includes:    Ibuprofen  800 Mg Tabs (Ibuprofen) ..... One tab by mouth two times a day    Cyclobenzaprine Hcl 10 Mg Tabs (Cyclobenzaprine hcl) ..... One tab by mouth every 8 hours as needed    Lortab 5 5-500 Mg Tabs (Hydrocodone-acetaminophen) .Marland Kitchen... Take 1/2 tablet   Problem # 5:  ANKLE PAIN, LEFT (ICD-719.47) Assessment: New injection achilles bursa Orders: Est. Patient Level IV (29528)    Patient Instructions: 1)  You have received an injection of cortisone today. You may experience increased pain at the injection site. Apply ice pack to the area for 20 minutes every 2 hours and take 2 xtra strength tylenol every 8 hours. This increased pain will usually resolve in 24 hours. The injection will take effect in 3-10 days.   2)  f/u for left knee xrays  3)  NCS from Dr. Harriett Sine office next available appointmnet with ncs    ]

## 2010-03-29 NOTE — Letter (Signed)
Summary: [facility] medical record requesrt  [facility] medical record requesrt   Imported By: Jacklynn Ganong 01/27/2010 10:23:25  _____________________________________________________________________  External Attachment:    Type:   Image     Comment:   External Document

## 2010-03-29 NOTE — Assessment & Plan Note (Signed)
Summary: OFFICE VISIT-   Vital Signs:  Patient profile:   45 year old female Menstrual status:  hysterectomy Height:      63.5 inches Weight:      329.56 pounds BMI:     57.67 O2 Sat:      98 % Pulse rate:   61 / minute Pulse rhythm:   regular Resp:     16 per minute BP sitting:   140 / 90  (left arm) Cuff size:   xl  Vitals Entered By: Everitt Amber (October 08, 2008 10:09 AM)  Nutrition Counseling: Patient's BMI is greater than 25 and therefore counseled on weight management options. CC: having some pain in right hip Is Patient Diabetic? No   CC:  having some pain in right hip.  History of Present Illness: pt reprots improved in back and lowe ext pain since receiving the epidural and prednisone burst.She stillhas numbness of the lower ext and some weakness as well as incontinence.She does plan to return to neurosurgeon when she has payed an outstanding bill. She denies any recent fever or chills. She denies any sinus pressure or chest congestion, she denies urinary symptoms except for the incontinence.  Current Medications (verified): 1)  Singulair 10 Mg  Tabs (Montelukast Sodium) .... One Tab By Mouth Once Daily 2)  Omeprazole 20 Mg  Cpdr (Omeprazole) .... One Cap By Mouth Two Times A Day 3)  Lovastatin 40 Mg Tabs (Lovastatin) .... Take 2 Tabs At Bedtime 4)  Voltaren 1 % Gel (Diclofenac Sodium) .... Uad Qid 5)  Fexofenadine Hcl 180 Mg Tabs (Fexofenadine Hcl) .... Take 1 Tablet By Mouth Once A Day 6)  Darvocet-N 50 50-325 Mg Tabs (Propoxyphene N-Apap) .... One Tab By Mouth Two Times A Day Prn 7)  Norco 5-325 Mg Tabs (Hydrocodone-Acetaminophen) 8)  Phentermine Hcl 37.5 Mg Caps (Phentermine Hcl) .... Take 1 Capsule By Mouth Once A Day 9)  Gabapentin 300 Mg Caps (Gabapentin) .Marland Kitchen.. 1 in The Morning and 2 At Bedtime 10)  Restoril 15 Mg Caps (Temazepam) .... Take 1 Capsule By Mouth At Bedtime 11)  Norco 5-325 Mg Tabs (Hydrocodone-Acetaminophen) .... One By Mouth Q 4 Hrs As Needed  Pain 12)  Oxybutynin Chloride 10 Mg Xr24h-Tab (Oxybutynin Chloride) .... Take 1 Tablet By Mouth Once A Day  Allergies (verified): No Known Drug Allergies  Review of Systems      See HPI General:  Denies chills and fever. Eyes:  Denies blurring and discharge. ENT:  Denies earache, hoarseness, nasal congestion, sinus pressure, and sore throat. CV:  Denies chest pain or discomfort, palpitations, and swelling of feet. Resp:  Denies cough and sputum productive. GI:  Complains of indigestion; solid dysphagia for the past 3 days, pt to start omeprazole regularly. GU:  Denies dysuria and urinary frequency. MS:  Complains of low back pain and mid back pain; back pain imoroved , but stillhas right lower ext weakness and numbness, but better. Derm:  Denies itching and rash. Psych:  Denies anxiety and depression. Heme:  Denies abnormal bruising and bleeding.  Physical Exam  General:  alert, well-hydrated, and overweight-appearing. HEENT: No facial asymmetry,  EOMI, No sinus tenderness, TM's Clear, oropharynx  pink and moist.   Chest: Clear to auscultation bilaterally.  CVS: S1, S2, No murmurs, No S3.   Abd: Soft, Nontender.  MS: decreased ROM spine, hips,  and knees.  Ext: No edema.   CNS: CN 2-12 intact, power tone and sensation normal throughout.   Skin: Intact, no visible  lesions or rashes.  Psych: Good eye contact, normal affect.  Memory intact, not anxious or depressed appearing.      Impression & Recommendations:  Problem # 1:  INSOMNIA (ICD-780.52) Assessment Improved  Her updated medication list for this problem includes:    Restoril 15 Mg Caps (Temazepam) .Marland Kitchen... Take 1 capsule by mouth at bedtime  Problem # 2:  BACK PAIN, LUMBAR, WITH RADICULOPATHY (ICD-724.4) Assessment: Improved  The following medications were removed from the medication list:    Darvocet-n 50 50-325 Mg Tabs (Propoxyphene n-apap) ..... One tab by mouth two times a day prn    Norco 5-325 Mg Tabs  (Hydrocodone-acetaminophen) Her updated medication list for this problem includes:    Norco 5-325 Mg Tabs (Hydrocodone-acetaminophen) ..... One by mouth q 4 hrs as needed pain  Problem # 3:  HYPERLIPIDEMIA (ICD-272.4) Assessment: Comment Only  Her updated medication list for this problem includes:    Lovastatin 40 Mg Tabs (Lovastatin) .Marland Kitchen... Take 2 tabs at bedtime, pt reports non compliancedue to finances impt of filling script stressed  Orders: T-Lipid Profile [id number]) T-Hepatic Function [id number])  Labs Reviewed: SGOT: 25 (05/18/2008)   SGPT: 34 (05/18/2008)   HDL:68 (05/18/2008), 69 (11/19/2007)  LDL:161 (05/18/2008), 126 (11/19/2007)  Chol:254 (05/18/2008), 222 (11/19/2007)  Trig:127 (05/18/2008), 135 (11/19/2007)  Problem # 4:  OBESITY, UNSPECIFIED (ICD-278.00) Assessment: Deteriorated  Ht: 63.5 (10/08/2008)   Wt: 329.56 (10/08/2008)   BMI: 57.67 (10/08/2008)  Complete Medication List: 1)  Singulair 10 Mg Tabs (Montelukast sodium) .... One tab by mouth once daily 2)  Omeprazole 20 Mg Cpdr (Omeprazole) .... One cap by mouth two times a day 3)  Lovastatin 40 Mg Tabs (Lovastatin) .... Take 2 tabs at bedtime 4)  Phentermine Hcl 37.5 Mg Caps (Phentermine hcl) .... Take 1 capsule by mouth once a day 5)  Gabapentin 300 Mg Caps (Gabapentin) .Marland Kitchen.. 1 in the morning and 2 at bedtime 6)  Restoril 15 Mg Caps (Temazepam) .... Take 1 capsule by mouth at bedtime 7)  Norco 5-325 Mg Tabs (Hydrocodone-acetaminophen) .... One by mouth q 4 hrs as needed pain 8)  Oxybutynin Chloride 10 Mg Xr24h-tab (Oxybutynin chloride) .... Take 1 tablet by mouth once a day  Other Orders: T-TSH ([id number])  Patient Instructions: 1)  F/U in mid to end October. 2)  Pls take omeprazole regularly. 3)  You need to reduce your salt intake and lose weight and get regular physical activity, 15 to 20 mins daily. 4)  Pls try to set appt with neurosurgery when you are able 5)  Hepatic Panel prior to visit,  ICD-9: 6)  Lipid Panel prior to visit, ICD-9:   fasting in October 7)  TSH prior to visit, ICD-9:

## 2010-03-29 NOTE — Assessment & Plan Note (Signed)
Summary: office visit   Vital Signs:  Patient profile:   45 year old female Menstrual status:  hysterectomy Height:      63.5 inches Weight:      332.75 pounds BMI:     58.23 O2 Sat:      98 % on Room air Pulse rate:   76 / minute Pulse rhythm:   regular Resp:     16 per minute BP sitting:   130 / 80  (left arm)  Vitals Entered By: Worthy Keeler LPN (March 03, 2009 10:35 AM)  Nutrition Counseling: Patient's BMI is greater than 25 and therefore counseled on weight management options.  O2 Flow:  Room air  CC: follow-up visit Is Patient Diabetic? No Pain Assessment Patient in pain? yes     Location: shoulders Intensity: 8 Type: aching Onset of pain  Constant   Primary Care Provider:  Syliva Overman MD  CC:  follow-up visit.  History of Present Illness: Pt reports increased depression, uncontrolled and worsening back and joint pins as she continues to gain weight. She states she has made up her mind to get surgical intevention for her obesity problem ND HAS DECIDED TO PUT ANY TAX MONEY SHE GETSA BACK TOWARD THIS. sHESTATES THAT HER OBESITY is HER HEALTH PROBLEM, SHE HAS TRIED FOR YEARS UNSUCCESFULLY TO LOSE WEIGHT WITH MEDS AND PHYSICAL ACTIVITY, WHICH IS EXTREMELY LIMITED FOR HER.   Current Medications (verified): 1)  Singulair 10 Mg  Tabs (Montelukast Sodium) .... One Tab By Mouth Once Daily 2)  Omeprazole 20 Mg  Cpdr (Omeprazole) .... One Cap By Mouth Two Times A Day 3)  Phentermine Hcl 37.5 Mg Caps (Phentermine Hcl) .... Take 1 Capsule By Mouth Once A Day 4)  Gabapentin 300 Mg Caps (Gabapentin) .Marland Kitchen.. 1 in The Morning and 2 At Bedtime 5)  Methocarbamol 500 Mg Tabs (Methocarbamol) .... One Tab By Mouth Every 8 Hours Prn 6)  Ibuprofen 800 Mg Tabs (Ibuprofen) .... Take 1 Tablet By Mouth Two Times A Day As Needed 7)  Restoril 30 Mg Caps (Temazepam) .... Take 1 Capsule By Mouth At Bedtime 8)  Lovastatin 40 Mg Tabs (Lovastatin) .... Two Tablets At Bedtime 9)  Tylenol  Arthritis Pain 650 Mg Cr-Tabs (Acetaminophen) .... Take 1 Tablet By Mouth Two Times A Day 10)  Ultram 50 Mg Tabs (Tramadol Hcl) .... One By Mouth Q 4 Hrs As Needed Pain  Allergies (verified): No Known Drug Allergies  Review of Systems      See HPI General:  Complains of fatigue, sleep disorder, and weakness; denies chills and fever; c/o disabling insomnia, wants help.. ENT:  Denies earache, hoarseness, nasal congestion, and sore throat. CV:  Complains of shortness of breath with exertion; denies chest pain or discomfort, palpitations, and swelling of feet. Resp:  Complains of shortness of breath and wheezing; denies cough and sputum productive; WELL CONTROLLED ASTHMA ON MEDS. GI:  Denies abdominal pain, constipation, diarrhea, nausea, and vomiting. GU:  Denies dysuria and urinary frequency. MS:  Complains of joint pain, low back pain, mid back pain, and stiffness; neck and upper extremity pain WEORSENING, requests that she be changed from ibuprofen to mobic, also c/o muscle spasms and has no muscle relaxant. Derm:  Denies itching and rash. Neuro:  Denies headaches, seizures, and sensation of room spinning. Psych:  Complains of anxiety, depression, easily tearful, and irritability; denies mental problems, panic attacks, sense of great danger, suicidal thoughts/plans, thoughts of violence, and unusual visions or sounds. Endo:  Denies  cold intolerance, excessive hunger, excessive thirst, excessive urination, heat intolerance, polyuria, and weight change. Heme:  Denies abnormal bruising and bleeding. Allergy:  Complains of seasonal allergies.  Physical Exam  General:  Well-developed,MORBIDLY OBESED,in no acute distress; alert,appropriate and cooperative throughout examination HEENT: No facial asymmetry,  EOMI, No sinus tenderness, TM's Clear, oropharynx  pink and moist.   Chest: Clear to auscultation bilaterally.  CVS: S1, S2, No murmurs, No S3.   Abd: Soft, Nontender.  MS: decreased  ROM  spine, hips, shoulders and knees.  Ext: No edema.   CNS: CN 2-12 intact, power tone and sensation normal throughout.   Skin: Intact, no visible lesions or rashes.  Psych: Good eye contact, normal affect.  Memory intact, not anxious but depressed appearing.    Impression & Recommendations:  Problem # 1:  INSOMNIA (ICD-780.52)  Her updated medication list for this problem includes:    Restoril 30 Mg Caps (Temazepam) .Marland Kitchen... Take 1 capsule by mouth at bedtime  Discussed sleep hygiene.   Problem # 2:  HYPERLIPIDEMIA (ICD-272.4) Assessment: Comment Only  Her updated medication list for this problem includes:    Lovastatin 40 Mg Tabs (Lovastatin) .Marland Kitchen..Marland Kitchen Two tablets at bedtime  Orders: T-Lipid Profile [id number])  Labs Reviewed: SGOT: 17 (11/27/2008)   SGPT: 20 (11/27/2008)   HDL:59 (11/27/2008), 68 (05/18/2008)  LDL:159 (11/27/2008), 161 (05/18/2008)  Chol:238 (11/27/2008), 254 (05/18/2008)  Trig:100 (11/27/2008), 127 (05/18/2008)  Problem # 3:  ASTHMA, UNSPECIFIED, UNSPECIFIED STATUS (ICD-493.90) Assessment: Improved  Her updated medication list for this problem includes:    Singulair 10 Mg Tabs (Montelukast sodium) ..... One tab by mouth once daily  Problem # 4:  BACK PAIN, CHRONIC (ICD-724.5) Assessment: Deteriorated  The following medications were removed from the medication list:    Methocarbamol 500 Mg Tabs (Methocarbamol) ..... One tab by mouth every 8 hours prn    Ibuprofen 800 Mg Tabs (Ibuprofen) .Marland Kitchen... Take 1 tablet by mouth two times a day as needed Her updated medication list for this problem includes:    Tylenol Arthritis Pain 650 Mg Cr-tabs (Acetaminophen) .Marland Kitchen... Take 1 tablet by mouth two times a day    Ultram 50 Mg Tabs (Tramadol hcl) ..... One by mouth q 4 hrs as needed pain    Meloxicam 15 Mg Tabs (Meloxicam) .Marland Kitchen... Take 1 tablet by mouth once a day    Tizanidine Hcl 4 Mg Tabs (Tizanidine hcl) .Marland Kitchen... Take 1 tablet by mouth three times a day Toradol 60mg  IM  administered at oV  Problem # 5:  OBESITY, UNSPECIFIED (ICD-278.00) Assessment: Deteriorated  Ht: 63.5 (03/03/2009)   Wt: 332.75 (03/03/2009)   BMI: 58.23 (03/03/2009)  Complete Medication List: 1)  Singulair 10 Mg Tabs (Montelukast sodium) .... One tab by mouth once daily 2)  Omeprazole 20 Mg Cpdr (Omeprazole) .... One cap by mouth two times a day 3)  Phentermine Hcl 37.5 Mg Caps (Phentermine hcl) .... Take 1 capsule by mouth once a day 4)  Restoril 30 Mg Caps (Temazepam) .... Take 1 capsule by mouth at bedtime 5)  Lovastatin 40 Mg Tabs (Lovastatin) .... Two tablets at bedtime 6)  Tylenol Arthritis Pain 650 Mg Cr-tabs (Acetaminophen) .... Take 1 tablet by mouth two times a day 7)  Ultram 50 Mg Tabs (Tramadol hcl) .... One by mouth q 4 hrs as needed pain 8)  Gabapentin 300 Mg Caps (Gabapentin) .... One  in the morning at 8am, second at at 2pm , then three at bedtime 9)  Meloxicam 15 Mg Tabs (Meloxicam) .Marland KitchenMarland KitchenMarland Kitchen  Take 1 tablet by mouth once a day 10)  Tizanidine Hcl 4 Mg Tabs (Tizanidine hcl) .... Take 1 tablet by mouth three times a day  Other Orders: T-Basic Metabolic Panel [id number])  Patient Instructions: 1)  Please schedule a follow-up appointment in 2 weeks. 2)  It is important that you exercise regularly at least 20 minutes 5 times a week. If you develop chest pain, have severe difficulty breathing, or feel very tired , stop exercising immediately and seek medical attention. 3)  You need to lose weight. Consider a lower calorie diet and regular exercise.  4)  New meds a s discussed. Prescriptions: RESTORIL 30 MG CAPS (TEMAZEPAM) Take 1 capsule by mouth at bedtime  #30 x 3   Entered by:   Everitt Amber   Authorized by:   Syliva Overman MD   Signed by:   Everitt Amber on 03/04/2009   Method used:   Printed then faxed to ...       [facility]* (retail)       [address]       [place], [place]  [address]       Ph: [phone]       Fax:  [phone]   RxID:   [id number] TIZANIDINE HCL 4 MG TABS (TIZANIDINE HCL) Take 1 tablet by mouth three times a day  #90 x 3   Entered and Authorized by:   Syliva Overman MD   Signed by:   Syliva Overman MD on 03/03/2009   Method used:   Printed then faxed to ...       [facility]* (retail)       [address]       [place], [place]  [address]       Ph: [phone]       Fax: [phone]   RxID:   [id number] MELOXICAM 15 MG TABS (MELOXICAM) Take 1 tablet by mouth once a day  #30 x 3   Entered and Authorized by:   Syliva Overman MD   Signed by:   Syliva Overman MD on 03/03/2009   Method used:   Printed then faxed to ...       [facility]* (retail)       [address]       [place], [place]  [address]       Ph: [phone]       Fax: [phone]   RxID:   [id number] GABAPENTIN 300 MG CAPS (GABAPENTIN) one  in the morning at 8am, second at at 2pm , then three at bedtime  #150 x 3   Entered and Authorized by:   Syliva Overman MD   Signed by:   Syliva Overman MD on 03/03/2009   Method used:   Printed then faxed to ...       [facility]* (retail)       [address]       [place], [place]  [address]       Ph: [phone]       Fax: [phone]   RxID:   [id number]   Appended Document: office visit this pt should have received torqadol 60mh IM during the OV for back pain, based on her paperwork, if she did pls document so that the record can be  accurate nd she can then be billed   Appended Document: office visit   Medication Administration  Injection # 1:    Medication: Ketorolac-Toradol 15mg     Diagnosis: BACK PAIN, LUMBAR, WITH RADICULOPATHY (ICD-724.4)    Route: IM    Site: RUOQ gluteus    Exp Date: 09/2010    Lot #: [id number]    Mfr: novaplus    Comments: 60 mg given     Patient tolerated injection without complications    Given  by: Everitt Amber (March 11, 2009 11:50 AM)  Orders Added: 1)  Ketorolac-Toradol 15mg  [[id number]] 2)  Admin of Therapeutic Inj  intramuscular or subcutaneous [[address]]

## 2010-03-29 NOTE — Progress Notes (Signed)
  Phone Note From Pharmacy   Caller: [facility] Summary of Call: refill request for phentermine Initial call taken by: Adella Hare LPN,  May 10, 2009 4:30 PM  Follow-up for Phone Call        ok x 1, needs ov in next 4 weeks Follow-up by: Syliva Overman MD,  May 10, 2009 5:42 PM    Prescriptions: PHENTERMINE HCL 37.5 MG CAPS (PHENTERMINE HCL) Take 1 capsule by mouth once a day  #30 x 0   Entered by:   Adella Hare LPN   Authorized by:   Syliva Overman MD   Signed by:   Adella Hare LPN on 04/54/0981   Method used:   Printed then faxed to ...       [facility] (retail)       [address]       [place], [place]  [address]       Ph: [phone]       Fax: [phone]   RxID:   [id number]

## 2010-03-29 NOTE — Progress Notes (Signed)
Summary: results  Phone Note Call from Patient   Summary of Call: would like to get results on x-ray of shoulder. [phone] Initial call taken by: Rudene Anda,  January 08, 2009 11:19 AM  Follow-up for Phone Call        Phone Call Completed, states shoulder hurting, ibuprofen not working, does she need an appt? Follow-up by: Worthy Keeler LPN,  January 08, 2009 1:38 PM  Additional Follow-up for Phone Call Additional follow up Details #1::        advise an ortho eval for uncontrolled shoulder pain, if she agrees and knows which ortho she wants to see pls forward a referral request with the details Additional Follow-up by: Syliva Overman MD,  January 08, 2009 3:11 PM    Additional Follow-up for Phone Call Additional follow up Details #2::    patient states no preference on ortho, [place] okay Follow-up by: Worthy Keeler LPN,  January 08, 2009 3:42 PM  Additional Follow-up for Phone Call Additional follow up Details #3:: Details for Additional Follow-up Action Taken: faxed information to dr. Diamantina [facility] office. they will call pt back to give her appt. pt notified  Additional Follow-up by: Rudene Anda,  January 11, 2009 9:29 AM

## 2010-03-29 NOTE — Miscellaneous (Signed)
Summary: Rehab Report  Rehab Report   Imported By: Elvera Maria 11/08/2007 11:11:30  _____________________________________________________________________  External Attachment:    Type:   Image     Comment:   clinical eval

## 2010-03-29 NOTE — Progress Notes (Signed)
Summary: NO SHOW  Phone Note Other Incoming   Summary of Call: SHE WAS A NO SHOW AT DR. Harriett Sine TODAY THE OFFICE CALLED Initial call taken by: Lind Guest,  February 01, 2009 2:41 PM  Follow-up for Phone Call        noted Follow-up by: Syliva Overman MD,  February 01, 2009 3:09 PM

## 2010-03-29 NOTE — Progress Notes (Signed)
Summary: LOWER BACK PAIN  Phone Note Call from Patient   Summary of Call: HAVING PAIN LOWER BACK AND DOWN IN LEGS WE HAVE NO  APPTS. AVA.  SHE HAS AN APPT ON THE 20TH BUT CAN NOT WAIT THAT LONG. WHAT CAN SHE DO?  Follow-up for Phone Call        patient came in office today for pain injections Follow-up by: Worthy Keeler LPN,  October 09, 2007 9:51 AM

## 2010-03-29 NOTE — Progress Notes (Signed)
  Phone Note Call from Patient   Summary of Call: Patient just got back from a trip and left all her meds on the bus and has been trying to contact them but so far no luck. I filled her regular meds but one of them was Vicodin. I explained to her that I didn't think she would be able to get that one filled early since it was just filled on the 20th. She wanted me to see if she could get a temp supply or if she had to go without the whole time.  CA Initial call taken by: Everitt Amber LPN,  Jul 27, 2009 4:46 PM  Follow-up for Phone Call        She has 2 refills on her Vic[place]. She can see if her ins will pay for her to get a refill this soon.  Otherwise will have to wait. Follow-up by: Esperanza Sheets PA,  Jul 27, 2009 4:49 PM  Additional Follow-up for Phone Call Additional follow up Details #1::        called patient and voicemail box was full adn could not accept messages Additional Follow-up by: Everitt Amber LPN,  July 28, 1608 8:30 AM    Additional Follow-up for Phone Call Additional follow up Details #2::    Marijean Niemann spoke to patient and told her if insurance wouldn't pay to have it filled then she would have to wait until it was due. Patient to make app for pain shot Follow-up by: Everitt Amber LPN,  July 29, 9602 1:41 PM

## 2010-03-29 NOTE — Assessment & Plan Note (Signed)
Summary: MRI RESULTS/[facility] FILM HERE REPORT REC'D/EVERCARE/BSF   Visit Type:  imaging f/u  Referring Provider:  Dr Rosine Abe   CC:  left knee pain.  History of Present Illness: She is a 45 years old woman with the complaint of:  left knee pain, referral Claudette Laws, pain clinic.  Mrs. Voth is been evaluated here for LEFT knee pain and was started on a physical therapy program given a Medrol Dosepak or steroid Dosepak and advised to lose weight and follow up as needed.  She had an injection from the referring physician in January and that did help her for one week she is currently taking Norco 5 mg for pain along with gabapentin which is primarily for her neurologic and neurogenic pain.  The most recent x-ray was done in [place] on January 12 that shows arthritis of the knee.  Primarily has pain radiating from the LEFT hip down to the LEFT foot as well as LEFT knee pain  Pain is constant.  MRI results of the left knee taken [facility] on 06/18/08.  Still having pain, no swelling.  Norco 5 as needed, does not help.     Current Medications (verified): 1)  Singulair 10 Mg  Tabs (Montelukast Sodium) .... One Tab By Mouth Once Daily 2)  Omeprazole 20 Mg  Cpdr (Omeprazole) .... One Cap By Mouth Two Times A Day 3)  Gabapentin 300 Mg Caps (Gabapentin) .... Take 2 Caps By Mouth At Bedtime 4)  Phentermine Hcl 37.5 Mg  Caps (Phentermine Hcl) .... Take 1 Tablet By Mouth Once A Day 5)  Zolpidem Tartrate 10 Mg Tabs (Zolpidem Tartrate) .... Take 1 Tab By Mouth At Bedtime 6)  Lovastatin 40 Mg Tabs (Lovastatin) .... Take 2 Tabs At Bedtime 7)  Floxin Otic 0.3 % Soln (Ofloxacin) .... 5 Drops in Ears Two Times A Day 8)  Voltaren 1 % Gel (Diclofenac Sodium) .... Uad Qid 9)  Fexofenadine Hcl 180 Mg Tabs (Fexofenadine Hcl) .... Take 1 Tablet By Mouth Once A Day 10)  Tessalon Perles 100 Mg Caps (Benzonatate) .... Take 1 Tablet By Mouth Three Times A Day 11)  Darvocet-N 50 50-325 Mg Tabs  (Propoxyphene N-Apap) .... One Tab By Mouth Two Times A Day Prn 12)  Norco 5-325 Mg Tabs (Hydrocodone-Acetaminophen)  Allergies (verified): No Known Drug Allergies  Review of Systems MS:  Complains of joint pain and joint swelling; denies rheumatoid arthritis, gout, bone cancer, osteoporosis, and .  The review of systems is negative for General, Cardiac , Resp, GI, GU, Neuro, Endo, Psych, Derm, EENT, Immunology, and Lymphatic.  Physical Exam  Additional Exam:  Constitutional: vital signs see recorded values. General: normal development, nutrition, and grooming. No deformity. Body Habitus moderate obesity CDV: Observation and palpation was normal  Lymph: palpation of the lymph nodes were normal Skin: inspection and palpation of the skin revealed no abnormalities  Neuro: coordination: normal              DTR's normal              Sensation was normal  Psyche: Alert and oriented x 3. Mood was normal.  Affect: normal  MSK: Gait: waddling  Upper extremities:Normal alignment and no atrophy, subluxation or tremor or contracture   Lower extremities:   Inspection of the LEFT knee reveals medial and lateral joint line tenderness. Range of motion is limited to 115. Strength is normal. Stability normal. Meniscal signs are negative.   Impression & Recommendations:  Problem # 1:  DERANGEMENT MENISCUS (ICD-717.5) Assessment Comment Only  Orders: Est. Patient Level IV (04540)  Problem # 2:  KNEE, ARTHRITIS, DEGEN./OSTEO (JWJ-191.47) Assessment: Comment Only     Orders: Est. Patient Level IV (82956)  Problem # 3:  KNEE PAIN (ICD-719.46) Assessment: Comment Only  PLAN SALK, MEDIAL MENISECTOMY     Orders: Est. Patient Level IV (21308)  Medications Added to Medication List This Visit: 1)  Norco 5-325 Mg Tabs (Hydrocodone-acetaminophen)  Patient Instructions: 1)  Informed consent process: I have discussed the procedure with the patient. I have answered their questions. The  risks of bleeding, infection, nerve and vascualr injury have been discussed. The diagnosis and reason for surgery have been explained. The patient demonstrates understanding of this discussion. Specific to this procedure risks include:  2)  swelling and pain from arthritis  3)  DOS  6/18 4)  PREOP 08/11/08 100pm 5)  POSTOP 08/18/08 6)    7)

## 2010-03-29 NOTE — Assessment & Plan Note (Signed)
Summary: REVIEW NCS RESULTS,DR DOONQUAH/CAF    History of Present Illness: I saw Jessica Stevens in the office today for a followup visit.  She is a 45 years old woman with the complaint of:  carpal tunnel syndrome. bilateal  the patient had I nerve conduction study, he shows bilateral carpal tunnel syndrome, she's doing better wearing full-time bracing. She's not taking any medicine at this time for bracing.  Review of systems: She complains of stiffness and spasms in the cervical spine.      Updated Prior Medication List: LIPITOR 40 MG  TABS (ATORVASTATIN CALCIUM) one tab by mouth at bedtime SINGULAIR 10 MG  TABS (MONTELUKAST SODIUM) one tab by mouth once daily OMEPRAZOLE 20 MG  CPDR (OMEPRAZOLE) one cap by mouth two times a day HYDROXYZINE HCL 25 MG  TABS (HYDROXYZINE HCL) 2 - 3 tabs by mouth at bedtime OXYBUTYNIN CHLORIDE 5 MG  TB24 (OXYBUTYNIN CHLORIDE) one tab by mouth once daily IBUPROFEN 800 MG  TABS (IBUPROFEN) one tab by mouth two times a day PROVENTIL HFA 108 (90 BASE) MCG/ACT  AERS (ALBUTEROL SULFATE) two puffs every 6 - 8 hours as needed LORTAB 5 5-500 MG  TABS (HYDROCODONE-ACETAMINOPHEN) take 1/2 tablet NEURONTIN 100 MG  CAPS (GABAPENTIN) 1 at night ROBAXIN 500 MG  TABS (METHOCARBAMOL) 1 by mouth q 8 hrs as needed spasms neck  Current Allergies: No known allergies   Past Medical History:    Reviewed history from 06/25/2007 and no changes required:       Current Problems:        H N P-CERVICAL (ICD-722.0)       RUPTURE ROTATOR CUFF (ICD-727.61)         Past Surgical History:    Reviewed history from 06/25/2007 and no changes required:       Partial hysterectomy       Tube inserted in right ear (2003)     Review of Systems       no new problems under review of systems     Impression & Recommendations:  Problem # 1:  CARPAL TUNNEL SYNDROME (ICD-354.0) Assessment: Improved  Orders: Est. Patient Level III (54270)   Problem # 2:  KNEE, ARTHRITIS,  DEGEN./OSTEO (ICD-715.96) Assessment: Unchanged  The following medications were removed from the medication list:    Cyclobenzaprine Hcl 10 Mg Tabs (Cyclobenzaprine hcl) ..... One tab by mouth every 8 hours as needed  Her updated medication list for this problem includes:    Ibuprofen 800 Mg Tabs (Ibuprofen) ..... One tab by mouth two times a day    Lortab 5 5-500 Mg Tabs (Hydrocodone-acetaminophen) .Marland Kitchen... Take 1/2 tablet    Robaxin 500 Mg Tabs (Methocarbamol) .Marland Kitchen... 1 by mouth q 8 hrs as needed spasms neck   Problem # 3:  BACK PAIN, CHRONIC (ICD-724.5) Assessment: Unchanged  The following medications were removed from the medication list:    Cyclobenzaprine Hcl 10 Mg Tabs (Cyclobenzaprine hcl) ..... One tab by mouth every 8 hours as needed  Her updated medication list for this problem includes:    Ibuprofen 800 Mg Tabs (Ibuprofen) ..... One tab by mouth two times a day    Lortab 5 5-500 Mg Tabs (Hydrocodone-acetaminophen) .Marland Kitchen... Take 1/2 tablet    Robaxin 500 Mg Tabs (Methocarbamol) .Marland Kitchen... 1 by mouth q 8 hrs as needed spasms neck  at this point I recommended Neurontin and vitamin B6 with continued aware of her splints. This should control her carpal tunnel syndrome.  Medications Added to Medication List  This Visit: 1)  Neurontin 100 Mg Caps (Gabapentin) .Marland Kitchen.. 1 at night 2)  Robaxin 500 Mg Tabs (Methocarbamol) .Marland Kitchen.. 1 by mouth q 8 hrs as needed spasms neck   Patient Instructions: 1)  Vit B 6  2)  Neurontin 100mg   3)  Continue the ibuprofen  4)  Please schedule a follow-up appointment in 1 month.   Prescriptions: ROBAXIN 500 MG  TABS (METHOCARBAMOL) 1 by mouth q 8 hrs as needed spasms neck  #60 x 2   Entered and Authorized by:   Fuller Canada MD   Signed by:   Fuller Canada MD on 09/02/2007   Method used:   Faxed to ...       [facility]*       [address]       [place], [place]  [address]       Ph: [phone]       Fax:  [phone]   RxID:   [id number] NEURONTIN 100 MG  CAPS (GABAPENTIN) 1 at night  #30 x 2   Entered and Authorized by:   Fuller Canada MD   Signed by:   Fuller Canada MD on 09/02/2007   Method used:   Faxed to ...       [facility]*       [address]       [place], [place]  [address]       Ph: [phone]       Fax: [phone]   RxID:   [id number]  ]

## 2010-03-29 NOTE — Letter (Signed)
Summary: DEMO  DEMO   Imported By: Lind Guest 11/17/2009 13:32:44  _____________________________________________________________________  External Attachment:    Type:   Image     Comment:   External Document

## 2010-03-29 NOTE — Miscellaneous (Signed)
Summary: Rehab Report  Rehab Report   Imported By: Jacklynn Ganong 08/12/2007 12:16:56  _____________________________________________________________________  External Attachment:    Type:   Image     Comment:   PT Discharge Summary

## 2010-03-29 NOTE — Assessment & Plan Note (Signed)
Summary: shoulder pain- room 2   Vital Signs:  Patient profile:   45 year old female Menstrual status:  hysterectomy Height:      63.5 inches Weight:      322 pounds BMI:     56.35 O2 Sat:      99 % on Room air Pulse rate:   68 / minute Resp:     16 per minute BP supine:   136 / 90  (right arm) Cuff size:   large  Vitals Entered By: Adella Hare LPN (November 26, 2009 10:32 AM) CC: shoulder pain   Referring Provider:  Dr Rosine Abe  Primary Provider:  Syliva Overman MD  CC:  shoulder pain.  History of Present Illness: Pt present today with c/o pain across her shoulders and up her neck, intermittent "for a long time." " Has been hurting all week."  Has been taking prescription muscle relaxer without help, and also using warm and cool compresses.  Radiation bilat UE.  No numbness or tingling.  Has tried PT and has traction machine at home.  Tried the traction last 2 nights and no help.  Has also had Cervical spine MRI and has been to [facility].  The injections DrSimpson gives usually helps.  Also c/o dark drainage from her Rt ear x 3 weeks.  No pain.  Hx of PE tubes bilat approx 1 yr ago. Knows the Rt one fell out.  Has also had white drainage during the day, dries in ear opening too.  No ear pain.  No URI syptoms.  Wears a cotton ball in ear due to the drainage.     Current Medications (verified): 1)  Singulair 10 Mg  Tabs (Montelukast Sodium) .... One Tab By Mouth Once Daily 2)  Omeprazole 20 Mg  Cpdr (Omeprazole) .... One Cap By Mouth Two Times A Day 3)  Restoril 30 Mg Caps (Temazepam) .... Take 1 Capsule By Mouth At Bedtime 4)  Lovastatin 40 Mg Tabs (Lovastatin) .... Two Tablets At Bedtime 5)  Tylenol Arthritis Pain 650 Mg Cr-Tabs (Acetaminophen) .... Take 1 Tablet By Mouth Two Times A Day 6)  Gabapentin 300 Mg Caps (Gabapentin) .... One  in The Morning At 8am, Second At At 2pm , Then Three At Bedtime 7)  Meloxicam 15 Mg Tabs (Meloxicam) .... Take 1 Tablet  By Mouth Once A Day 8)  Gnp Vitamin B-6 100 Mg Tabs (Pyridoxine Hcl) .... Take 1 Tablet By Mouth Once A Day 9)  Hydroxyzine Hcl 25 Mg Tabs (Hydroxyzine Hcl) .... Take 1 Tab By Mouth At Bedtime 10)  Duoneb 0.5-2.5 (3) Mg/21ml Soln (Ipratropium-Albuterol) .... Use With [employer] Two Times A Day 11)  Vicodin Es 7.5-750 Mg Tabs (Hydrocodone-Acetaminophen) .... One Twice Daily As Needed 12)  Phentermine Hcl 37.5 Mg Tabs (Phentermine Hcl) .... Take 1 Tablet By Mouth Once A Day 13)  Clotrimazole-Betamethasone 1-0.05 % Crea (Clotrimazole-Betamethasone) .... Apply Twice Daily To Affected Areas 14)  Methocarbamol 500 Mg Tabs (Methocarbamol) .... Take 2 Tablets Every 6 Hrs As Needed For Muscle Spasm 15)  Hydrocodone-Acetaminophen 2.5-500 Mg Tabs (Hydrocodone-Acetaminophen) .... One By Mouth Q 4 Hrs As Needed Pain 16)  B & B Elastic Knee Brace  Misc (Elastic Bandages & Supports) .... Needs Knee Sleeve For Oa of The Knee  Weighs 327 17)  Norco 5-325 Mg Tabs (Hydrocodone-Acetaminophen) .Marland Kitchen.. 1 By Mouth Q 4 Hrs As Needed Pain  Allergies (verified): No Known Drug Allergies  Past History:  Past medical history reviewed  for relevance to current acute and chronic problems.  Past Medical History: Reviewed history from 10/16/2007 and no changes required. Current Problems:  H N P-CERVICAL (ICD-722.0) RUPTURE ROTATOR CUFF (ICD-727.61) Current Problems:  CERVICAL RADICULOPATHY (ICD-723.4) ANKLE PAIN, LEFT (ICD-719.47) KNEE, ARTHRITIS, DEGEN./OSTEO (ICD-715.96) KNEE PAIN (ICD-719.46) CARPAL TUNNEL SYNDROME (ICD-354.0) URINARY INCONTINENCE (ICD-788.30) GYNECOMASTIA (ICD-611.1) ASTHMA, UNSPECIFIED, UNSPECIFIED STATUS (ICD-493.90) DEPRESSION (ICD-311) OSTEOARTHRITIS, KNEE (ICD-715.96) BACK PAIN, CHRONIC (ICD-724.5) OBESITY, UNSPECIFIED (ICD-278.00) HYPERLIPIDEMIA (ICD-272.4) H N P-CERVICAL (ICD-722.0) RUPTURE ROTATOR CUFF (ICD-727.61)  Review of Systems General:  Denies chills and fever. ENT:   Complains of ear discharge; denies earache, nasal congestion, sinus pressure, and sore throat. CV:  Denies chest pain or discomfort and palpitations. Resp:  Denies cough and shortness of breath.  Physical Exam  General:  Well-developed,well-nourished,in no acute distress; alert,appropriate and cooperative throughout examination Head:  Normocephalic and atraumatic without obvious abnormalities. No apparent alopecia or balding. Ears:  L TM & EAC neg.  R PE tube in place, no drainage or discharge noted, only small amt of dark cerumen superior EAC  wall noted. Nose:  External nasal examination shows no deformity or inflammation. Nasal mucosa are pink and moist without lesions or exudates. Mouth:  Oral mucosa and oropharynx without lesions or exudates.   Neck:  No deformities, masses, or tenderness noted. Lungs:  Normal respiratory effort, chest expands symmetrically. Lungs are clear to auscultation, no crackles or wheezes. Heart:  Normal rate and regular rhythm. S1 and S2 normal without gallop, murmur, click, rub or other extra sounds. Msk:  Cervical & Thoracic:  Muscular TTP.  FROM. Pulses:  R radial normal and L radial normal.   Neurologic:  alert & oriented X3, sensation intact to light touch, gait normal, and DTRs symmetrical and normal.   Cervical Nodes:  No lymphadenopathy noted Psych:  Cognition and judgment appear intact. Alert and cooperative with normal attention span and concentration. No apparent delusions, illusions, hallucinations   Impression & Recommendations:  Problem # 1:  CERVICAL RADICULOPATHY (ICD-723.4) Assessment Deteriorated  Problem # 2:  ROM (ICD-382.9) Assessment: New  The following medications were removed from the medication list:    Tylenol Arthritis Pain 650 Mg Cr-tabs (Acetaminophen) .Marland Kitchen... Take 1 tablet by mouth two times a day Her updated medication list for this problem includes:    Meloxicam 15 Mg Tabs (Meloxicam) .Marland Kitchen... Take 1 tablet by mouth once a  day  Complete Medication List: 1)  Singulair 10 Mg Tabs (Montelukast sodium) .... One tab by mouth once daily 2)  Omeprazole 20 Mg Cpdr (Omeprazole) .... One cap by mouth two times a day 3)  Lovastatin 40 Mg Tabs (Lovastatin) .... Two tablets at bedtime 4)  Gabapentin 300 Mg Caps (Gabapentin) .... One  in the morning at 8am, second at at 2pm , then three at bedtime 5)  Meloxicam 15 Mg Tabs (Meloxicam) .... Take 1 tablet by mouth once a day 6)  Hydroxyzine Hcl 25 Mg Tabs (Hydroxyzine hcl) .... Take 1 tab by mouth at bedtime 7)  Duoneb 0.5-2.5 (3) Mg/86ml Soln (Ipratropium-albuterol) .... Use with neb machine two times a day 8)  Phentermine Hcl 37.5 Mg Tabs (Phentermine hcl) .... Take 1 tablet by mouth once a day 9)  Clotrimazole-betamethasone 1-0.05 % Crea (Clotrimazole-betamethasone) .... Apply twice daily to affected areas 10)  Methocarbamol 500 Mg Tabs (Methocarbamol) .... Take 2 tablets every 6 hrs as needed for muscle spasm 11)  B & B Elastic Knee Brace Misc (Elastic bandages & supports) .... Needs knee sleeve for oa  of the knee  weighs 327 12)  Norco 5-325 Mg Tabs (Hydrocodone-acetaminophen) .Marland Kitchen.. 1 by mouth q 4 hrs as needed pain 13)  Ofloxacin 0.3 % Soln (Ofloxacin) .... Instill 10 drops in right ear once a day x 7 days  Other Orders: Depo- Medrol 80mg  (J1040) Ketorolac-Toradol 15mg  (Z6109) Admin of Therapeutic Inj  intramuscular or subcutaneous (60454)  Patient Instructions: 1)  Keep your next appt with DrSimpson. 2)  You have received Toradol and Depo Medrol today for pain. 3)  I have prescribed an antibiotic ear drop for your Rt ear infection. If you continue to have drainage you will need to follow up with your ENT Dr. Prescriptions: OFLOXACIN 0.3 % SOLN (OFLOXACIN) instill 10 drops in right ear once a day x 7 days  #5 ml x 0   Entered and Authorized by:   Esperanza Sheets PA   Signed by:   Esperanza Sheets PA on 11/26/2009   Method used:   Electronically to        [facility]* (retail)       [address] [place], [place]  [address]       Ph: [phone]       Fax: [phone]   RxID:   [id number]    Medication Administration  Injection # 1:    Medication: Depo- Medrol 80mg     Diagnosis: SHOULDER PAIN, BILATERAL (ICD-719.41)    Route: IM    Site: LUOQ gluteus    Exp Date: 04/12    Lot #: [id number]    Mfr: [employer]acia    Patient tolerated injection without complications    Given by: Adella Hare LPN (November 26, 2009 11:24 AM)  Injection # 2:    Medication: Ketorolac-Toradol 15mg     Diagnosis: SHOULDER PAIN, BILATERAL (ICD-719.41)    Route: IM    Site: RUOQ gluteus    Exp Date: 04/28/2011    Lot #: [id number]    Mfr: [email or web address]    Comments: toradol 60mg  given    Patient tolerated injection without complications    Given by: Adella Hare LPN (November 26, 2009 11:25 AM)  Orders Added: 1)  Depo- Medrol 80mg  [J1040] 2)  Ketorolac-Toradol 15mg  [J1885] 3)  Admin of Therapeutic Inj  intramuscular or subcutaneous [96372] 4)  Est. Patient Level IV [74259]

## 2010-03-29 NOTE — Miscellaneous (Signed)
Summary: refill  Clinical Lists Changes  Medications: Added new medication of ALLEGRA 180 MG  TABS (FEXOFENADINE HCL) one tab by mouth once daily - Signed Rx of ALLEGRA 180 MG  TABS (FEXOFENADINE HCL) one tab by mouth once daily;  #30 x 2;  Signed;  Entered by: Worthy Keeler LPN;  Authorized by: Syliva Overman MD;  Method used: Electronic    Prescriptions: ALLEGRA 180 MG  TABS (FEXOFENADINE HCL) one tab by mouth once daily  #30 x 2   Entered by:   Worthy Keeler LPN   Authorized by:   Syliva Overman MD   Signed by:   Worthy Keeler LPN on 16/11/9602   Method used:   Electronically sent to ...       [facility]*       [address]       [place], [place]  [address]       Ph: [phone]       Fax: [phone]   RxID:   [id number]

## 2010-03-29 NOTE — Assessment & Plan Note (Signed)
Summary: LT ARM PAIN/AP ER F/UP 04/23/07,XR THERE/[facility]   Vital Signs:  Patient Profile:   45 Years Old Female Weight:      340 pounds Pulse rate:   80 / minute Resp:     18 per minute  Vitals Entered By: Ether Griffins (May 21, 2007 2:29 PM)                 Chief Complaint:  left arm pain.  History of Present Illness: I saw Jessica Stevens in the office today for an initial visit.  She is a 45 years old woman with the complaint of:  left arm pain. REFERRAL SIMPSON.  Patient had xrays at [facility] on 04-22-07 of left shoulder and left elbow.  She has been having pain for a month   Her pain is from shoulder that radiates down to left hand that goes to middle and ring finger. She does not have any numbness or tingling; she does have a lot of pain at night.  Pain level today is 7. Her pain is sharp. Her rom is decreased. She has not had any injections in her shoulder or therapy. She has neck pain also has stiffness of her neck and takes flexeril which does not help.  She has had therapy for her neck and that helped her.     Current Allergies: No known allergies   Past Medical History:    obesity    asthma    perennial allergies    depression    anxiety    ddd spine chronic pain    gynecomastia    sinusitis   Social History:    Patient is single.     unemployed   Risk Factors:  Tobacco use:  never Caffeine use:  0 drinks per day Alcohol use:  no   Review of Systems  MS      Complains of joint pain.      Denies rheumatoid arthritis, joint swelling, gout, bone cancer, osteoporosis, and .  Psych      Complains of depression and anxiety.      Denies mood swings, panic attack, bipolar, and schizophrenia.  EENT      Complains of poor vision, poor hearing, and ears ringing.      Denies cataracts, glaucoma, vertigo, sinusitis, hoarseness, toothaches, and bleeding gums.  The review of systems is negative for General, Cardiac , Resp, GI,  GU, Neuro, Endo, Derm, Immunology, and Lymphatic.   Physical Exam  Msk:     The lower extrmities show normal appearance, ROM, strength and stability  Cervical Nodes:     no significant adenopathy Psych:     alert and cooperative; normal mood and affect; normal attention span and concentration   Detailed Back/Spine Exam  Cervical Exam:  Inspection-deformity:    Abnormal Palpation-spinal tenderness:  Abnormal    Location:  C5-C6 Range of Motion:    Forward Flexion:   normal degrees    Hyperextension:   normal degrees    Right Lat. Rotation:   normal  degrees    Left Lat. Rotation:   normal  degrees Spurling Maneuver:    negative   Shoulder/Elbow Exam  General:    Well-developed, well-nourished, obese with oitherwise normal body habitus; no deformities, normal grooming.    Skin:    Intact, no scars, lesions, rashes, cafe au lait spots or bruising.    Inspection:    Inspection is normal.    Palpation:    tenderness L-deltoid:  and tenderness L-suprascapular:Marland Kitchen    Vascular:    Radial, ulnar, brachial, and axillary pulses 2+ and symmetric; capillary refill less than 2 seconds; no evidence of ischemia, clubbing, or cyanosis.    Sensory:    Gross sensation intact in the upper extremities.    Motor:    Normal strength in the right upper extremity  decreased L-supraspinatus and decreased L-ER'S.  drop test revealed poor ability to hold the arm in the elevated position.   Reflexes:    Normal reflexes in the upper extremities.    Shoulder Exam:    Right:    Inspection:  Normal    Palpation:  Normal    Stability:  stable    Tenderness:  no    Swelling:  no    Erythema:  no    Range of Motion:       Flexion-Active: 180       Extension-Active: 45       Flexion-Passive: 180       Extension-Passive: 45       External Rotation : 45       Interior Rotation : T7    Left:    Inspection:  Abnormal    Palpation:  Abnormal    Stability:  could not assess     Tenderness:       Swelling:  no    Erythema:  no    Range of Motion:       Flexion-Active: 60       Extension-Active:         Flexion-Passive: 120       Extension-Passive:         External Rotation : 30       Interior Rotation :  none   Impingement Sign NEER:    Right negative; Left positive Impingement Sign HAWKINS:    Right negative; Left positive    Impression & Recommendations:  Problem # 1:  RUPTURE ROTATOR CUFF (ICD-727.61) The x-rays were done at [facility]. The report and the films have been reviewed. 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 1 by mouth q 4 as needed pain   Patient Instructions: 1)  MRI f/u    Prescriptions: LORTAB 2.5 2.5-500 MG  TABS (HYDROCODONE-ACETAMINOPHEN) 1 by mouth q 4 as needed pain  #40 x 0   Entered and Authorized by:   Fuller Canada MD   Signed by:   Fuller Canada MD on 05/22/2007   Method used:   Print then Give to Patient   RxID:   [id number]  ]

## 2010-03-29 NOTE — Progress Notes (Signed)
  Phone Note From Pharmacy   Caller: [facility]* Summary of Call: prevpac not covered okay to fill drugs separately? Initial call taken by: Adella Hare LPN,  October 05, 2009 5:09 PM  Follow-up for Phone Call        yesit is ok to do so Follow-up by: Syliva Overman MD,  October 05, 2009 5:18 PM  Additional Follow-up for Phone Call Additional follow up Details #1::        advised nathan at [facility] okay Additional Follow-up by: Adella Hare LPN,  October 06, 2009 9:09 AM

## 2010-03-29 NOTE — Progress Notes (Signed)
Summary: paper work  Phone Note Call from Patient   Summary of Call: needs to speak with nurse about some paper work. also has question about rx.  [phone]  Initial call taken by: Rudene Anda,  May 14, 2009 9:37 AM  Follow-up for Phone Call        returned call, left message Follow-up by: Adella Hare LPN,  May 14, 2009 1:37 PM  Additional Follow-up for Phone Call Additional follow up Details #1::        patient was wondering if weight loss form was ready, was up front, no one called to let her know this was ready, will pick up on monday Additional Follow-up by: Adella Hare LPN,  May 14, 2009 1:41 PM

## 2010-03-29 NOTE — Assessment & Plan Note (Signed)
Summary: REVIEW MRI RESULTS/LT ARM/[facility]    History of Present Illness: I saw Jessica Stevens in the office today for a followup visit.  She is a 45 years old woman with the complaint of:  MRI results of left arm and cervical spine taken at [facility] on 05-30-07.      Current Allergies: No known allergies         Impression & Recommendations:  Problem # 1:  RUPTURE ROTATOR CUFF (ICD-727.61) Assessment: Unchanged MRIthere is subtle findings noted one osteoarthritis glenohumeral joint with labral degeneration, os acromiale, supraspinatus tendon tear.  I think however we can manage this with rehabilitation or should at least try I recommended physical therapy and follow up if no improvement in arthroscopic surgery warranted. MRI reviewed with films. Orders: Physical Therapy Referral (PT) Est. Patient Level III ([address])    Patient Instructions: 1)  Limit activity to comfort and avoid activities that increase discomfort.  Apply moist heat and/or ice to shoulder and take medication as instructed for pain relief. Please read the Shoulder Pain Handout and start Physical Therapy as directed. 2)  continue the lortab  3)  7 wk f/u    Prescriptions: LORTAB 2.5 2.5-500 MG  TABS (HYDROCODONE-ACETAMINOPHEN) 1 by mouth q 4 as needed pain  #90 x 0   Entered and Authorized by:   Fuller Canada MD   Signed by:   Fuller Canada MD on 06/17/2007   Method used:   Print then Give to Patient   RxID:   [id number]  ]

## 2010-03-29 NOTE — Miscellaneous (Signed)
Summary: refill\  Clinical Lists Changes  Medications: Rx of METHOCARBAMOL 500 MG TABS (METHOCARBAMOL) one tab by mouth every 8 hours prn;  #90 x 2;  Signed;  Entered by: Everitt Amber;  Authorized by: Syliva Overman MD;  Method used: Electronically to [facility]*, [address] [place], [place], [place]  [address], Ph: [phone], Fax: [phone]    Prescriptions: METHOCARBAMOL 500 MG TABS (METHOCARBAMOL) one tab by mouth every 8 hours prn  #90 x 2   Entered by:   Everitt Amber   Authorized by:   Syliva Overman MD   Signed by:   Everitt Amber on 01/14/2009   Method used:   Electronically to        [facility]* (retail)       [address] [place], [place]  [address]       Ph: [phone]       Fax: [phone]   RxID:   [id number]

## 2010-03-29 NOTE — Progress Notes (Signed)
Summary: speak with nurse  Phone Note Call from Patient   Summary of Call: needs to speak with nurse about one med not being faxed over. [phone] Initial call taken by: Rudene Anda,  November 10, 2009 11:44 AM  Follow-up for Phone Call        can we refill hydrocodone? Follow-up by: Adella Hare LPN,  November 10, 2009 11:52 AM  Additional Follow-up for Phone Call Additional follow up Details #1::        dr Mort Sawyers office rxs the hydrocodone , not [place] Additional Follow-up by: Syliva Overman MD,  November 10, 2009 1:03 PM    Additional Follow-up for Phone Call Additional follow up Details #2::    we have prescribed from here as well, please see med list Follow-up by: Adella Hare LPN,  November 11, 2009 4:19 PM  Additional Follow-up for Phone Call Additional follow up Details #3:: Details for Additional Follow-up Action Taken: if we did not dfill recently as in the last time do not refill pls, if we did then refill onmly if we did the last one verify with pharmacy who did thatpls Additional Follow-up by: Syliva Overman MD,  November 11, 2009 4:23 PM  patient to call dr Romeo Apple for refill

## 2010-03-29 NOTE — Progress Notes (Signed)
Summary: RE: MRI appt information - [facility]  Phone Note Outgoing Call   Call placed to: Patient Summary of Call: RE: MRI LT knee:Called [employer] insurance; no pre-cert required.  Called patient to offer appt; voice msg cannot take new msgs.  Keep trying to reach pt: MRI appt Info as follows: Thurs, Jun 18, 2008 -register 10AM;Appt time 10:30AM.  Pt needs fol up appt here for results - Pt [facility] [phone] Initial call taken by: Cammie Sickle,  June 16, 2008 3:36 PM  Follow-up for Phone Call        Left a second msg at home ph# [phone] and another msg at cell ph#.  Appt is tomorrow for MRI at [facility], 10:00AM arrival time. Follow-up by: Cammie Sickle,  June 17, 2008 4:26 PM  Additional Follow-up for Phone Call Additional follow up Details #1::        Per fol up at [facility], pt did arrive for her MRI as scheduled. Pt to call back re: fol up appt for review of results. Additional Follow-up by: Cammie Sickle,  June 18, 2008 10:49 AM

## 2010-03-29 NOTE — Progress Notes (Signed)
Summary: Neurosurgeon referral.  Phone Note Outgoing Call   Call placed by: Waldon Reining,  March 16, 2009 9:11 AM Call placed to: Specialist Action Taken: Information Sent Summary of Call: I faxed a referral for this patient to [facility] to be seen for her neck.

## 2010-03-29 NOTE — Letter (Signed)
Summary: Letter  Letter   Imported By: Lind Guest 03/18/2009 13:49:34  _____________________________________________________________________  External Attachment:    Type:   Image     Comment:   External Document

## 2010-03-29 NOTE — Miscellaneous (Signed)
Summary: Orders Update  Clinical Lists Changes  Orders: Added new Test order of T-Basic Metabolic Panel ([id number]) - Signed Added new Test order of T-Hepatic Function ([id number]) - Signed Added new Test order of T-Lipid Profile ([id number]) - Signed  

## 2010-03-29 NOTE — Letter (Signed)
Summary: cpap supplies  cpap supplies   Imported By: Lind Guest 11/19/2009 13:29:58  _____________________________________________________________________  External Attachment:    Type:   Image     Comment:   External Document

## 2010-03-29 NOTE — Progress Notes (Signed)
Summary: USING THE BATHROOM ON HERSELF  Phone Note Call from Patient   Summary of Call: SHE IS USING THE BATHROOM ON HERSELF (P) YOU HAD SAID SOMETHING ABOUT SOME PATCHES  [place] [facility] CALL HER BACK AT [phone] Initial call taken by: Lind Guest,  September 11, 2008 3:27 PM  Follow-up for Phone Call        advise a med has been sent in, oxybutynin, she will have to use this before the ns will approve the patch most likely Follow-up by: Syliva Overman MD,  September 13, 2008 3:35 PM  Additional Follow-up for Phone Call Additional follow up Details #1::        Returned call, no answer Additional Follow-up by: Everitt Amber,  September 14, 2008 9:12 AM    Additional Follow-up for Phone Call Additional follow up Details #2::    Patient aware Follow-up by: Everitt Amber,  September 14, 2008 1:08 PM

## 2010-03-29 NOTE — Assessment & Plan Note (Signed)
Summary: 2 WK RE-CK KNEE/F/UP XR AT [facility]    History of Present Illness: I saw Jessica Stevens in the office today for a 2 week followup visit.  She is a 45 years old woman with the complaint of: left  knee pain.  Patient was given a Dose pack and an order for therapy on last visit.  Patient states that her knee is still aching. She says that the dose pack helped the pain that was shooting down her leg but did not stop her knee from aching.    Review of systems: No locking catching or giving out.       Updated Prior Medication List: LIPITOR 40 MG  TABS (ATORVASTATIN CALCIUM) one tab by mouth at bedtime SINGULAIR 10 MG  TABS (MONTELUKAST SODIUM) one tab by mouth once daily OMEPRAZOLE 20 MG  CPDR (OMEPRAZOLE) one cap by mouth two times a day HYDROXYZINE HCL 25 MG  TABS (HYDROXYZINE HCL) 2 - 3 tabs by mouth at bedtime OXYBUTYNIN CHLORIDE 5 MG  TB24 (OXYBUTYNIN CHLORIDE) one tab by mouth once daily IBUPROFEN 800 MG  TABS (IBUPROFEN) one tab by mouth two times a day PROVENTIL HFA 108 (90 BASE) MCG/ACT  AERS (ALBUTEROL SULFATE) two puffs every 6 - 8 hours as needed LORTAB 5 5-500 MG  TABS (HYDROCODONE-ACETAMINOPHEN) take 1/2 tablet NEURONTIN 100 MG  CAPS (GABAPENTIN) 1 at night ROBAXIN 500 MG  TABS (METHOCARBAMOL) 1 by mouth q 8 hrs as needed spasms neck ALLEGRA 180 MG  TABS (FEXOFENADINE HCL) one tab by mouth once daily STERAPRED DS 12 DAY 10 MG  TABS (PREDNISONE) as directed PHENTERMINE HCL 37.5 MG  CAPS (PHENTERMINE HCL) Take 1 tablet by mouth once a day  Current Allergies: No known allergies   Past Medical History:    Reviewed history from 10/16/2007 and no changes required:       Current Problems:        H N P-CERVICAL (ICD-722.0)       RUPTURE ROTATOR CUFF (ICD-727.61)       Current Problems:        CERVICAL RADICULOPATHY (ICD-723.4)       ANKLE PAIN, LEFT (ICD-719.47)       KNEE, ARTHRITIS, DEGEN./OSTEO (ICD-715.96)       KNEE PAIN (ICD-719.46)  CARPAL TUNNEL SYNDROME (ICD-354.0)       URINARY INCONTINENCE (ICD-788.30)       GYNECOMASTIA (ICD-611.1)       ASTHMA, UNSPECIFIED, UNSPECIFIED STATUS (ICD-493.90)       DEPRESSION (ICD-311)       OSTEOARTHRITIS, KNEE (ICD-715.96)       BACK PAIN, CHRONIC (ICD-724.5)       OBESITY, UNSPECIFIED (ICD-278.00)       HYPERLIPIDEMIA (ICD-272.4)       H N P-CERVICAL (ICD-722.0)       RUPTURE ROTATOR CUFF (ICD-727.61)                Past Surgical History:    Reviewed history from 06/25/2007 and no changes required:       Partial hysterectomy       Tube inserted in right ear (2003)      Physical Exam  This is an obese female with normal development grooming and hygiene. Her skin is intact without lesion. Her sensation is good she is awake and alert. Inspection of the right knee reveals medial and lateral joint line tenderness. Range of motion is limited to 115. Strength is normal. Stability normal. Meniscal signs are negative.  Impression & Recommendations:  Problem # 1:  OSTEOARTHRITIS, KNEE (ICD-715.96) Assessment: Unchanged Verbal consent was obtained. The knee was prepped with alcohol and ethyl chloride. 1 cc of depomedrol 40mg /cc and 1 cc of sensorcaine .25% was injected. there were no complications.  Her updated medication list for this problem includes:    Ibuprofen 800 Mg Tabs (Ibuprofen) ..... One tab by mouth two times a day    Lortab 5 5-500 Mg Tabs (Hydrocodone-acetaminophen) .Marland Kitchen... Take 1/2 tablet    Robaxin 500 Mg Tabs (Methocarbamol) .Marland Kitchen... 1 by mouth q 8 hrs as needed spasms neck   Other Orders: Physical Therapy Referral (PT)   Patient Instructions: 1)  Return as needed  2)  Try to lose weight to help your knee arthritis  3)  (talk to your family physician to see if medication is needed to assist you with this)have received an injection of cortisone today. You may experience increased pain at the injection site. Apply ice pack to the area for 20 minutes every  2 hours and take 2 xtra strength tylenol every 8 hours. This increased pain will usually resolve in 24 hours. The injection will take effect in 3-10 days.    ]

## 2010-03-29 NOTE — Letter (Signed)
Summary: office notes  office notes   Imported By: Lind Guest 11/17/2009 14:01:30  _____________________________________________________________________  External Attachment:    Type:   Image     Comment:   External Document

## 2010-03-29 NOTE — Letter (Signed)
Summary: misc  misc   Imported By: Lind Guest 11/17/2009 13:56:53  _____________________________________________________________________  External Attachment:    Type:   Image     Comment:   External Document

## 2010-03-29 NOTE — Letter (Signed)
Summary: [facility]/ release  [facility]/ release   Imported By: Lind Guest 12/22/2009 14:35:37  _____________________________________________________________________  External Attachment:    Type:   Image     Comment:   External Document

## 2010-03-29 NOTE — Letter (Signed)
Summary: Surgery order LT Knee  Surgery order LT Knee   Imported By: Cammie Sickle 08/03/2008 14:16:30  _____________________________________________________________________  External Attachment:    Type:   Image     Comment:   External Document

## 2010-03-29 NOTE — Progress Notes (Signed)
Summary: [facility] appointment.  Phone Note Outgoing Call   Call placed by: Waldon Reining,  March 25, 2009 3:45 PM Call placed to: Specialist Action Taken: Appt scheduled Summary of Call: Patient has an appointment at [facility] on 03-29-09 at 10:30. I have tried several times to call the patient and her phone will not allow me to leave a message, it says that she is not available.

## 2010-03-29 NOTE — Miscellaneous (Signed)
Summary: Rehab Report  Rehab Report   Imported By: Lind Guest 01/31/2008 09:24:30  _____________________________________________________________________  External Attachment:    Type:   Image     Comment:   External Document

## 2010-03-29 NOTE — Letter (Signed)
Summary: labs  labs   Imported By: Lind Guest 11/17/2009 13:55:37  _____________________________________________________________________  External Attachment:    Type:   Image     Comment:   External Document

## 2010-03-29 NOTE — Miscellaneous (Signed)
Summary: Home Care Report  Home Care Report   Imported By: Elvera Maria 01/14/2008 11:28:26  _____________________________________________________________________  External Attachment:    Type:   Image     Comment:   advanced discharge

## 2010-03-29 NOTE — Progress Notes (Signed)
Summary: pain med  Phone Note Call from Patient   Summary of Call: ortho docs will not give her any pain meds for shoulder and back. pt wants to know if dr. Lodema Hong will [phone] Initial call taken by: Rudene Anda,  April 07, 2009 11:19 AM  Follow-up for Phone Call        pls stamp , fax and let her knopw vicodin has been prescribed Follow-up by: Syliva Overman MD,  April 07, 2009 5:03 PM  Additional Follow-up for Phone Call Additional follow up Details #1::        rx sent, patient aware Additional Follow-up by: Worthy Keeler LPN,  April 07, 2009 5:04 PM    New/Updated Medications: VICODIN ES 7.5-750 MG TABS (HYDROCODONE-ACETAMINOPHEN) one twice daily as needed Prescriptions: VICODIN ES 7.5-750 MG TABS (HYDROCODONE-ACETAMINOPHEN) one twice daily as needed  #60 x 2   Entered and Authorized by:   Syliva Overman MD   Signed by:   Syliva Overman MD on 04/07/2009   Method used:   Printed then faxed to ...       [facility]* (retail)       [address]       [place], [place]  [address]       Ph: [phone]       Fax: [phone]   RxID:   [id number]

## 2010-03-29 NOTE — Letter (Signed)
Summary: External Correspondence  External Correspondence   Imported By: Rudene Anda 08/10/2008 14:45:43  _____________________________________________________________________  External Attachment:    Type:   Image     Comment:   External Document

## 2010-03-29 NOTE — Progress Notes (Signed)
Summary: MRI APPOINTMENT.  Phone Note Outgoing Call   Call placed by: Waldon Reining,  June 04, 2007 1:51 PM Call placed to: Patient Action Taken: Phone Call Completed Summary of Call: I SCHEDULED PATIENT HER MRI AT [facility] ON 05-30-07 AT 12:15. PATIENT IS AWARE TO GET A COPY OF HER FILMS TO BRING TO DR. [name].

## 2010-03-29 NOTE — Progress Notes (Signed)
Summary: hydrocodone  Phone Note Call from Patient   Summary of Call: patient called in and asked if we could refill her Hydrocodone-acetaminophen she uses [facility].  I informed patient to call and have the pharmacy fax a request for this. She said she would. Initial call taken by: Curtis Sites,  November 11, 2009 11:10 AM

## 2010-03-29 NOTE — Assessment & Plan Note (Signed)
Summary: PHYSICAL   Vital Signs:  Patient Profile:   45 Years Old Female Height:     64 inches Weight:      328.50 pounds BMI:     56.59 Pulse rate:   79 / minute Resp:     16 per minute BP sitting:   130 / 86  (right arm)  Pt. in pain?   yes    Location:   lower back and left knee    Intensity:   8    Type:       aching  Vitals Entered By: Worthy Keeler LPN (October 17, 2007 10:48 AM)               Vision Screening: Left eye with correction: 20 / 25 Right eye with correction: 20 / 25 Both eyes with correction: 20 / 20         Chief Complaint:  physical.  History of Present Illness: patient comes in today reporting significant success in weight loss since starting pentermine. She denies any adverse side effects from the drug, specifically shortness of breath  or difficulty breathing.  She has had no recent fever or chills. She continues to experience uncontrolled genarilized pain in all joints.    Current Allergies: No known allergies      Review of Systems  ENT      Denies earache, hoarseness, sinus pressure, and sore throat.  CV      Denies chest pain or discomfort, palpitations, shortness of breath with exertion, and swelling of feet.  Resp      Denies cough, sputum productive, and wheezing.  GI      Denies abdominal pain, change in bowel habits, nausea, and vomiting.  GU      Denies dysuria and urinary frequency.  MS      Complains of joint pain, joint swelling, low back pain, mid back pain, stiffness, and thoracic pain.  Neuro      Denies headaches, seizures, and weakness.  Psych      Denies anxiety and depression.   Physical Exam  General:     overweight-appearing.   Head:     Normocephalic and atraumatic without obvious abnormalities. No apparent alopecia or balding. Eyes:     vision grossly intact, pupils equal, and pupils round.   Ears:     External ear exam shows no significant lesions or deformities.  Otoscopic examination  reveals clear canals, tympanic membranes are intact bilaterally without bulging, retraction, inflammation or discharge. Hearing is grossly normal bilaterally. Nose:     External nasal examination shows no deformity or inflammation. Nasal mucosa are pink and moist without lesions or exudates. Mouth:     pharynx pink and moist and fair dentition.   Neck:     No deformities, masses, or tenderness noted. Chest Wall:     No deformities, masses, or tenderness noted. Breasts:     No mass, nodules, thickening, tenderness, bulging, retraction, inflamation, nipple discharge or skin changes noted.   Lungs:     Normal respiratory effort, chest expands symmetrically. Lungs are clear to auscultation, no crackles or wheezes. Heart:     Normal rate and regular rhythm. S1 and S2 normal without gallop, murmur, click, rub or other extra sounds. Abdomen:     Bowel sounds positive,abdomen soft and non-tender without masses, organomegaly or hernias noted. Rectal:     No external abnormalities noted. Normal sphincter tone. No rectal masses or tenderness. Guaic negative stool. Genitalia:  Normal external genitalia. Vaginal walls healthy. No adnexal masses. Msk:     Decreased ROM spine , hips , shoulders and knees. Pulses:     R and L carotid,radial,femoral,dorsalis pedis and posterior tibial pulses are full and equal bilaterally Extremities:     No edema Neurologic:     alert & oriented X3, cranial nerves II-XII intact, strength normal in all extremities, sensation intact to light touch, DTRs symmetrical and normal, and finger-to-nose normal.   Skin:     Intact without suspicious lesions or rashes Psych:     Oriented X3, memory intact for recent and remote, normally interactive, good eye contact, not anxious appearing, and not depressed appearing.      Impression & Recommendations:  Problem # 1:  SHOULDER PAIN, BILATERAL (ICD-719.41) Assessment: Unchanged  Her updated medication list for this  problem includes:    Ibuprofen 800 Mg Tabs (Ibuprofen) ..... One tab by mouth two times a day    Lortab 5 5-500 Mg Tabs (Hydrocodone-acetaminophen) .Marland Kitchen... Take 1/2 tablet    Robaxin 500 Mg Tabs (Methocarbamol) .Marland Kitchen... 1 by mouth q 8 hrs as needed spasms neck   Problem # 2:  BACK PAIN, CHRONIC (ICD-724.5) Assessment: Deteriorated  Her updated medication list for this problem includes:    Ibuprofen 800 Mg Tabs (Ibuprofen) ..... One tab by mouth two times a day    Lortab 5 5-500 Mg Tabs (Hydrocodone-acetaminophen) .Marland Kitchen... Take 1/2 tablet    Robaxin 500 Mg Tabs (Methocarbamol) .Marland Kitchen... 1 by mouth q 8 hrs as needed spasms neck Toradol 60 mg Im administered at [facility] for this   Problem # 3:  GYNECOMASTIA (ICD-611.1) Assessment: Unchanged Breast reduction surgery denied at this time, I advised that with continued weight loss the chances of this wil improve.  Problem # 4:  DEPRESSION (ICD-311) Assessment: Improved  Her updated medication list for this problem includes:    Hydroxyzine Hcl 25 Mg Tabs (Hydroxyzine hcl) .Marland Kitchen... 2 - 3 tabs by mouth at bedtime   Problem # 5:  OBESITY, UNSPECIFIED (ICD-278.00) Assessment: Improved Continue phentermine and regular exercise.  Problem # 6:  SCREENING FOR MALIGNANT NEOPLASM, CERVIX (ICD-V76.2) Assessment: Comment Only  Orders: Pap Smear (46962)   Problem # 7:  Preventive Health Care (ICD-V70.0) Assessment: Comment Only Fecal occult bld test is negative  Complete Medication List: 1)  Lipitor 40 Mg Tabs (Atorvastatin calcium) .... One tab by mouth at bedtime 2)  Singulair 10 Mg Tabs (Montelukast sodium) .... One tab by mouth once daily 3)  Omeprazole 20 Mg Cpdr (Omeprazole) .... One cap by mouth two times a day 4)  Hydroxyzine Hcl 25 Mg Tabs (Hydroxyzine hcl) .... 2 - 3 tabs by mouth at bedtime 5)  Oxybutynin Chloride 5 Mg Tb24 (Oxybutynin chloride) .... One tab by mouth once daily 6)  Ibuprofen 800 Mg Tabs (Ibuprofen) .... One tab by mouth two times a  day 7)  Proventil Hfa 108 (90 Base) Mcg/act Aers (Albuterol sulfate) .... Two puffs every 6 - 8 hours as needed 8)  Lortab 5 5-500 Mg Tabs (Hydrocodone-acetaminophen) .... Take 1/2 tablet 9)  Neurontin 100 Mg Caps (Gabapentin) .Marland Kitchen.. 1 at night 10)  Robaxin 500 Mg Tabs (Methocarbamol) .Marland Kitchen.. 1 by mouth q 8 hrs as needed spasms neck 11)  Allegra 180 Mg Tabs (Fexofenadine hcl) .... One tab by mouth once daily 12)  Sterapred Ds 12 Day 10 Mg Tabs (Prednisone) .... As directed 13)  Phentermine Hcl 37.5 Mg Caps (Phentermine hcl) .... Take 1 tablet by  mouth once a day  Other Orders: T-Basic Metabolic Panel [id number]) T-Hepatic Function [id number]) T-Lipid Profile [phone]) Admin of Therapeutic Inj  intramuscular or subcutaneous (96295) Ketorolac-Toradol 15mg  (M8413)   Patient Instructions: 1)  Follow up in 6 weeks. 2)  CONGRATS ON WEIGHT LOSS, PLEASE KEEP IT UP!! 3)  You will receive Toradol injection today for your back pain. 4)  It is important that you exercise regularly at least 20 minutes 5 times a week. If you develop chest pain, have severe difficulty breathing, or feel very tired , stop exercising immediately and seek medical attention. 5)  You need to lose weight. Consider a lower calorie diet and regular exercise.  6)  Check your Blood Pressure regularly. If it is above150/95  you should make an appointment. 7)  BMP prior to visit, ICD-9: 8)  Hepatic Panel prior to visit, ICD-9: 9)  Lipid Panel prior to visit, ICD-9:  LABS IN 6 WEEKS   Prescriptions: PHENTERMINE HCL 37.5 MG  CAPS (PHENTERMINE HCL) Take 1 tablet by mouth once a day  #42 x 0   Entered and Authorized by:   Syliva Overman MD   Signed by:   Syliva Overman MD on 10/17/2007   Method used:   Printed then faxed to ...       [facility]* (retail)       [address], [place]  [address]       Ph: [phone]       Fax: [phone]   RxID:    [id number]  ]   Medication Administration  Injection # 1:    Medication: Ketorolac-Toradol 15mg     Diagnosis: OSTEOARTHRITIS, KNEE (ICD-715.96)    Route: IM    Site: LUOQ gluteus    Exp Date: 04/27/2009    Lot #: [id number]    Mfr: [email or web address]    Comments: toradol 60mg  given    Patient tolerated injection without complications    Given by: Worthy Keeler LPN (October 17, 2007 11:53 AM)  Orders Added: 1)  T-Basic Metabolic Panel [[id number]] 2)  T-Hepatic Function [[id number]] 3)  T-Lipid Profile [[id number]] 4)  Pap Smear [88150] 5)  Admin of Therapeutic Inj  intramuscular or subcutaneous [96372] 6)  Ketorolac-Toradol 15mg  [J1885] 7)  Est. Patient 40-64 years [99396]   Laboratory Results    Stool - Occult Blood Hemmoccult #1: negative Date: 10/17/2007 Comments: 51270 2R 7/10 D1761 8/12

## 2010-03-29 NOTE — Progress Notes (Signed)
Summary: call from primary care office about Rx  Phone Note From Other Clinic   Caller: Nurse Summary of Call: Merry Proud, nurse at [facility], Dr Simpson's office calling about refill on Hydrocodone per our office. States patient is at their office now about refill. Please call their office [phone] Initial call taken by: Cammie Sickle,  August 04, 2009 10:57 AM  Follow-up for Phone Call        we filled med this am, we received request from pt Follow-up by: Ether Griffins,  August 04, 2009 11:27 AM  Additional Follow-up for Phone Call Additional follow up Details #1::        I confirmed refilled today w/PCP office. Additional Follow-up by: Cammie Sickle,  August 04, 2009 11:50 AM

## 2010-03-29 NOTE — Letter (Signed)
Summary: CONSULTS  CONSULTS   Imported By: Lind Guest 11/17/2009 13:51:54  _____________________________________________________________________  External Attachment:    Type:   Image     Comment:   External Document

## 2010-03-29 NOTE — Letter (Signed)
Summary: RADIOLOGY REPORT LEFT KNEE  RADIOLOGY REPORT LEFT KNEE   Imported By: Lind Guest 03/19/2008 13:45:06  _____________________________________________________________________  External Attachment:    Type:   Image     Comment:   External Document

## 2010-03-29 NOTE — Assessment & Plan Note (Signed)
Summary: office visit   Vital Signs:  Patient profile:   45 year old female Menstrual status:  hysterectomy Height:      63.5 inches (161.29 cm) Weight:      318 pounds (144.55 kg) BMI:     55.65 O2 Sat:      98 % Pulse rate:   69 / minute Pulse rhythm:   regular Resp:     16 per minute BP sitting:   128 / 84  (left arm) Cuff size:   xl  Vitals Entered By: Everitt Amber (August 04, 2008 9:43 AM)  Nutrition Counseling: Patient's BMI is greater than 25 and therefore counseled on weight management options. CC: pain in both shoulders off and on and she goes to pain clinic and the meds are helping her legs but not shoulders Pain Assessment Patient in pain? yes     Location: shoulders Intensity: 6 Type: aching Onset of pain  off and on   CC:  pain in both shoulders off and on and she goes to pain clinic and the meds are helping her legs but not shoulders.  History of Present Illness: pt is slowly losing weight, she is 22 pounds less than she was in march 2009, she reports good response to the phenternmine and is trying to eat more veg.  she has recentlyhad 2 epidural injections with improvement in her back and leg pain.  She does have upcoming left knee arthroscopy in this month.  she c/o bilateral shoulder pain with reduced mobility of the shoulders, left worse than right. She also c/o neckpain radiating to both shoulders, she has tried and failed physical therapy . she states cannot walk up steps with a jug of milkbecause of weakness in her hands and shoulders   she is now getting injections in her wrist for carpal tunnel from the pain doc in [place], not Dr. Gerilyn Pilgrim.   c/o burning pain in the feet, worse in the heels and in the morning. she denies any recent fever or chills, head or chest congestion, dysuria or frequency. she denies depression or anxiety.  Current Medications (verified): 1)  Singulair 10 Mg  Tabs (Montelukast Sodium) .... One Tab By Mouth Once Daily 2)   Omeprazole 20 Mg  Cpdr (Omeprazole) .... One Cap By Mouth Two Times A Day 3)  Gabapentin 300 Mg Caps (Gabapentin) .... Take 2 Caps By Mouth At Bedtime 4)  Phentermine Hcl 37.5 Mg  Caps (Phentermine Hcl) .... Take 1 Tablet By Mouth Once A Day 5)  Zolpidem Tartrate 10 Mg Tabs (Zolpidem Tartrate) .... Take 1 Tab By Mouth At Bedtime 6)  Lovastatin 40 Mg Tabs (Lovastatin) .... Take 2 Tabs At Bedtime 7)  Voltaren 1 % Gel (Diclofenac Sodium) .... Uad Qid 8)  Fexofenadine Hcl 180 Mg Tabs (Fexofenadine Hcl) .... Take 1 Tablet By Mouth Once A Day 9)  Darvocet-N 50 50-325 Mg Tabs (Propoxyphene N-Apap) .... One Tab By Mouth Two Times A Day Prn 10)  Norco 5-325 Mg Tabs (Hydrocodone-Acetaminophen)  Allergies (verified): No Known Drug Allergies  Review of Systems General:  Denies chills and fever. ENT:  Denies hoarseness, nasal congestion, sinus pressure, and sore throat. CV:  Denies chest pain or discomfort, difficulty breathing while lying down, palpitations, and swelling of feet. Resp:  Denies cough, sputum productive, and wheezing. GI:  Denies abdominal pain, constipation, diarrhea, nausea, and vomiting. GU:  Denies dysuria and urinary frequency. MS:  Complains of joint pain, low back pain, mid back pain, and  stiffness; c/o . Neuro:  Denies headaches and seizures. Psych:  Denies alternate hallucination ( auditory/visual) and depression. Endo:  Denies excessive urination. Heme:  Denies abnormal bruising and bleeding. Allergy:  Complains of seasonal allergies and sneezing.  Physical Exam  General:  morbidly obese, well hydrated female in no C/p distress. HEENT: No facial asymmetry,  EOMI, No sinus tenderness, TM's Clear, oropharynx  pink and moist.   Chest: Clear to auscultation bilaterally.  CVS: S1, S2, No murmurs, No S3.   Abd: Soft, Nontender.  MS: decreased ROM spine, hips, shoulders and knees. Tender to palpation of heels. Ext: No edema.   CNS: CN 2-12 intact, power tone and sensation  normal throughout.   Skin: Intact, no visible lesions or rashes.  Psych: Good eye contact, normal affect.  Memory intact, not anxious or depressed appearing.    Impression & Recommendations:  Problem # 1:  FOOT PAIN, BILATERAL (ICD-729.5) Assessment Deteriorated  Orders: Podiatry Referral (Podiatry)  Problem # 2:  OBESITY, UNSPECIFIED (ICD-278.00) Assessment: Improved  Ht: 63.5 (08/04/2008)   Wt: 318 (08/04/2008)   BMI: 55.65 (08/04/2008)  Problem # 3:  DEPRESSION (ICD-311) Assessment: Improved  Problem # 4:  BACK PAIN, CHRONIC (ICD-724.5) Assessment: Improved  Her updated medication list for this problem includes:    Darvocet-n 50 50-325 Mg Tabs (Propoxyphene n-apap) ..... One tab by mouth two times a day prn    Norco 5-325 Mg Tabs (Hydrocodone-acetaminophen) dose of gabapentin is increased and the pt is receiving epidural injections  Problem # 5:  HYPERLIPIDEMIA (ICD-272.4) Assessment: Comment Only  Her updated medication list for this problem includes:    Lovastatin 40 Mg Tabs (Lovastatin) .Marland Kitchen... Take 2 tabs at bedtime  Orders: T-Hepatic Function [phone]) T-Lipid Profile [id number])  Labs Reviewed: SGOT: 25 (05/18/2008)   SGPT: 34 (05/18/2008)   HDL:68 (05/18/2008), 69 (11/19/2007)  LDL:161 (05/18/2008), 126 (11/19/2007)  Chol:254 (05/18/2008), 222 (11/19/2007)  Trig:127 (05/18/2008), 135 (11/19/2007)  Problem # 6:  ALLERGIC RHINITIS (ICD-477.9) Assessment: Improved  Her updated medication list for this problem includes:    Fexofenadine Hcl 180 Mg Tabs (Fexofenadine hcl) .Marland Kitchen... Take 1 tablet by mouth once a day  Complete Medication List: 1)  Singulair 10 Mg Tabs (Montelukast sodium) .... One tab by mouth once daily 2)  Omeprazole 20 Mg Cpdr (Omeprazole) .... One cap by mouth two times a day 3)  Zolpidem Tartrate 10 Mg Tabs (Zolpidem tartrate) .... Take 1 tab by mouth at bedtime 4)  Lovastatin 40 Mg Tabs (Lovastatin) .... Take 2 tabs at bedtime 5)   Voltaren 1 % Gel (Diclofenac sodium) .... Uad qid 6)  Fexofenadine Hcl 180 Mg Tabs (Fexofenadine hcl) .... Take 1 tablet by mouth once a day 7)  Darvocet-n 50 50-325 Mg Tabs (Propoxyphene n-apap) .... One tab by mouth two times a day prn 8)  Norco 5-325 Mg Tabs (Hydrocodone-acetaminophen) 9)  Phentermine Hcl 37.5 Mg Caps (Phentermine hcl) .... Take 1 capsule by mouth once a day 10)  Gabapentin 300 Mg Caps (Gabapentin) .Marland Kitchen.. 1 in the morning and 2 at bedtime 11)  Restoril 15 Mg Caps (Temazepam) .... Take 1 capsule by mouth at bedtime  Other Orders: T-Basic Metabolic Panel [id number]) T-TSH [id number]) T-Basic Metabolic Panel [phone]) T-TSH [phone]) Ketorolac-Toradol 15mg  [phone]) Admin of Therapeutic Inj  intramuscular or subcutaneous (29518)  Patient Instructions: 1)  Please schedule a follow-up appointment in 2 months. 2)  BMP prior to visit, ICD-9: 3)  Hepatic Panel prior to visit, ICD-9:   fasting in 2 months.  4)  Lipid Panel prior to visit, ICD-9: 5)  TSH prior to visit, ICD-9: 6)  You will  be referred  to the podiatrist for foot pain evaluation. 7)  It is important that you exercise regularly at least 20 minutes 5 times a week. If you develop chest pain, have severe difficulty breathing, or feel very tired , stop exercising immediately and seek medical attention. 8)  You need to lose weight. Consider a lower calorie diet and regular exercise. 9)  New dose of gabapentin as discussed and a new sleeping pill for sleep.  Prescriptions: RESTORIL 15 MG CAPS (TEMAZEPAM) Take 1 capsule by mouth at bedtime  #30 x 3   Entered and Authorized by:   Syliva Overman MD   Signed by:   Syliva Overman MD on 08/04/2008   Method used:   Printed then faxed to ...       [facility] (retail)       [address]       [place], [place]  [address]       Ph: [phone]       Fax: [phone]   RxID:   [id number] GABAPENTIN 300 MG CAPS  (GABAPENTIN) 1 in the morning and 2 at bedtime  #90 x 3   Entered and Authorized by:   Syliva Overman MD   Signed by:   Syliva Overman MD on 08/04/2008   Method used:   Electronically to        [facility] (retail)       [address]       [place], [place]  [address]       Ph: [phone]       Fax: [phone]   RxID:   [id number] PHENTERMINE HCL 37.5 MG CAPS (PHENTERMINE HCL) Take 1 capsule by mouth once a day  #30 x 1   Entered and Authorized by:   Syliva Overman MD   Signed by:   Syliva Overman MD on 08/04/2008   Method used:   Printed then faxed to ...       [facility] (retail)       [address]       [place], [place]  [address]       Ph: [phone]       Fax: [phone]   RxID:   [id number]     Medication Administration  Injection # 1:    Medication: Ketorolac-Toradol 15mg     Diagnosis: SHOULDER PAIN, BILATERAL (ICD-719.41)    Route: IM    Site: RUOQ gluteus    Exp Date: 04/28/2010    Lot #: [id number]    Mfr: [email or web address]    Comments: toradol 60mg  given    Patient tolerated injection without complications    Given by: Worthy Keeler LPN (August 04, 1949 10:33 AM)  Orders Added: 1)  T-Basic Metabolic Panel [phone] 2)  T-Lipid Profile [[id number]] 3)  T-Hepatic Function [[id number]] 4)  T-TSH [[id number]] 5)  Est. Patient Level IV [53299] 6)  T-Basic Metabolic Panel [[id number]] 7)  T-Hepatic Function [[id number]] 8)  T-Lipid Profile [[id number]] 9)  T-TSH [[id number]] 10)  Ketorolac-Toradol 15mg  [J1885] 11)  Admin of Therapeutic Inj  intramuscular or subcutaneous [96372] 12)  Podiatry Referral [Podiatry]

## 2010-03-29 NOTE — Assessment & Plan Note (Signed)
Summary: 3 WK RE-CK/POST OP 2/LT KNEE/SALK 08/14/08/EVERC/CAF   Visit Type:  Follow-up, post op  Referring Daniil Labarge:  Dr Rosine Abe   CC:  right leg pain .  History of Present Illness: I saw Jessica Stevens in the office today for a followup visit.  She is a 45 years old woman with the complaint of:  3 week recheck on the left knee after PT.  Arthroscopy left knee, partial medial menisectomy, chondroplasty of medial femoral condyle.  Left Knee is doing great.  DOS 08/14/08.  Norco 5 for pain.  Has had 3 ESI's in her L spine, helping.  Has appt with [facility] 09/28/08.  c/o right hip pain and giving way had 1st ESI          Allergies: No Known Drug Allergies  Physical Exam  Additional Exam:  left knee looks great  flexion = 120 degrees   no swelling or pain or tenderness   Impression & Recommendations:  Problem # 1:  AFTERCARE FOLLOW SURGERY MUSCULOSKEL SYSTEM NEC (ICD-V58.78)  Orders: Post-Op Check (78295)  Patient Instructions: 1)  as needed for the knee

## 2010-03-29 NOTE — Assessment & Plan Note (Signed)
Summary: office visit   Vital Signs:  Patient Profile:   45 Years Old Female Height:     64 inches Weight:      331.50 pounds BMI:     57.11 O2 Sat:      97 % Pulse rate:   101 / minute Resp:     16 per minute BP sitting:   124 / 82  (left arm)  Pt. in pain?   no  Vitals Entered By: Everitt Amber (January 09, 2008 1:34 PM)                  Chief Complaint:  Follow up, pain in neck, and left knee and back.  History of Present Illness: Non productive ches t  congestion for 3 weeks. C/o increased bacjk and  left knee pain recently got a shot in the  knee.Pt. wants an antinflammatory shot for the back pain.Wants a referral for the pain clinic.  The pt denies any recent fever or chills. She denies depression, anxiety or insomnia.    Updated Prior Medication List: LIPITOR 40 MG  TABS (ATORVASTATIN CALCIUM) one tab by mouth at bedtime SINGULAIR 10 MG  TABS (MONTELUKAST SODIUM) one tab by mouth once daily OMEPRAZOLE 20 MG  CPDR (OMEPRAZOLE) one cap by mouth two times a day OXYBUTYNIN CHLORIDE 5 MG  TB24 (OXYBUTYNIN CHLORIDE) one tab by mouth once daily NEURONTIN 100 MG  CAPS (GABAPENTIN) 1 at night ROBAXIN 500 MG  TABS (METHOCARBAMOL) 1 by mouth q 8 hrs as needed spasms neck ALLEGRA 180 MG  TABS (FEXOFENADINE HCL) one tab by mouth once daily STERAPRED DS 12 DAY 10 MG  TABS (PREDNISONE) as directed PHENTERMINE HCL 37.5 MG  CAPS (PHENTERMINE HCL) Take 1 tablet by mouth once a day CYCLOBENZAPRINE HCL 10 MG TABS (CYCLOBENZAPRINE HCL) Take one tab by mouth every 8 hours prn ZOLPIDEM TARTRATE 10 MG TABS (ZOLPIDEM TARTRATE) Take 1 tab by mouth at bedtime LEXAPRO 10 MG TABS (ESCITALOPRAM OXALATE) Take 1 tablet by mouth once a day  Current Allergies: No known allergies      Review of Systems  ENT      Denies hoarseness, nasal congestion, sinus pressure, and sore throat.  CV      Denies chest pain or discomfort, palpitations, and swelling of feet.  Resp      Denies cough,  sputum productive, and wheezing.  GI      Denies abdominal pain, constipation, diarrhea, nausea, and vomiting.  GU      Denies dysuria and urinary frequency.  MS      Complains of joint pain, low back pain, mid back pain, and stiffness.  Allergy      Complains of seasonal allergies.   Physical Exam  General:     morbidly obese Head:     Normocephalic and atraumatic without obvious abnormalities. No apparent alopecia or balding. Eyes:     vision grossly intact, pupils equal, and pupils round.   Ears:     External ear exam shows no significant lesions or deformities.  Otoscopic examination reveals clear canals, tympanic membranes are intact bilaterally without bulging, retraction, inflammation or discharge. Hearing is grossly normal bilaterally. Nose:     External nasal examination shows no deformity or inflammation. Nasal mucosa are pink and moist without lesions or exudates. Mouth:     pharynx pink and moist and fair dentition.   Neck:     No deformities, masses, or tenderness noted. Lungs:     Normal respiratory  effort, chest expands symmetrically. Lungs are clear to auscultation, no crackles or wheezes. Heart:     Normal rate and regular rhythm. S1 and S2 normal without gallop, murmur, click, rub or other extra sounds. Abdomen:     Bowel sounds positive,abdomen soft and non-tender without masses, organomegaly or hernias noted. Msk:     Decreased ROM spine , hips , shoulders and knees. Extremities:     No edema Skin:     Intact without suspicious lesions or rashes Cervical Nodes:     no significant adenopathy Psych:     Oriented X3, memory intact for recent and remote, normally interactive, good eye contact, not anxious appearing, and not depressed appearing.      Impression & Recommendations:  Problem # 1:  DEPRESSION (ICD-311) Assessment: Improved  The following medications were removed from the medication list:    Hydroxyzine Hcl 25 Mg Tabs (Hydroxyzine  hcl) .Marland Kitchen... 2 - 3 tabs by mouth at bedtime  Her updated medication list for this problem includes:    Lexapro 10 Mg Tabs (Escitalopram oxalate) .Marland Kitchen... Take 1 tablet by mouth once a day   Problem # 2:  BACK PAIN, CHRONIC (ICD-724.5) Assessment: Deteriorated  The following medications were removed from the medication list:    Ibuprofen 800 Mg Tabs (Ibuprofen) ..... One tab by mouth two times a day    Lortab 5 5-500 Mg Tabs (Hydrocodone-acetaminophen) .Marland Kitchen... Take 1/2 tablet  Her updated medication list for this problem includes:    Robaxin 500 Mg Tabs (Methocarbamol) .Marland Kitchen... 1 by mouth q 8 hrs as needed spasms neck    Robaxin 500 Mg Tabs (Methocarbamol) .Marland Kitchen... Take 1 tablet by mouth three times a day    Darvocet-n 100 100-650 Mg Tabs (Propoxyphene n-apap) .Marland Kitchen... Take 1 tablet by mouth three times a day  Orders: Pain Clinic Referral (Pain) Ketorolac-Toradol 15mg  (Z6109) Admin of Therapeutic Inj  intramuscular or subcutaneous (60454)   Problem # 3:  OBESITY, UNSPECIFIED (ICD-278.00) Assessment: Unchanged  Problem # 4:  HYPERLIPIDEMIA (ICD-272.4) Assessment: Comment Only  Her updated medication list for this problem includes:    Lipitor 40 Mg Tabs (Atorvastatin calcium) ..... One tab by mouth at bedtime  Labs Reviewed: Chol: 222 (11/19/2007)   HDL: 69 (11/19/2007)   LDL: 126 (11/19/2007)   TG: 135 (11/19/2007) SGOT: 26 (11/19/2007)   SGPT: 29 (11/19/2007) Rept labs needed at next OV   Complete Medication List: 1)  Lipitor 40 Mg Tabs (Atorvastatin calcium) .... One tab by mouth at bedtime 2)  Singulair 10 Mg Tabs (Montelukast sodium) .... One tab by mouth once daily 3)  Omeprazole 20 Mg Cpdr (Omeprazole) .... One cap by mouth two times a day 4)  Oxybutynin Chloride 5 Mg Tb24 (Oxybutynin chloride) .... One tab by mouth once daily 5)  Neurontin 100 Mg Caps (Gabapentin) .Marland Kitchen.. 1 at night 6)  Robaxin 500 Mg Tabs (Methocarbamol) .Marland Kitchen.. 1 by mouth q 8 hrs as needed spasms neck 7)  Allegra 180  Mg Tabs (Fexofenadine hcl) .... One tab by mouth once daily 8)  Sterapred Ds 12 Day 10 Mg Tabs (Prednisone) .... As directed 9)  Phentermine Hcl 37.5 Mg Caps (Phentermine hcl) .... Take 1 tablet by mouth once a day 10)  Zolpidem Tartrate 10 Mg Tabs (Zolpidem tartrate) .... Take 1 tab by mouth at bedtime 11)  Lexapro 10 Mg Tabs (Escitalopram oxalate) .... Take 1 tablet by mouth once a day 12)  Robaxin 500 Mg Tabs (Methocarbamol) .... Take 1 tablet by mouth  three times a day 13)  Darvocet-n 100 100-650 Mg Tabs (Propoxyphene n-apap) .... Take 1 tablet by mouth three times a day 14)  Adipex-p 37.5 Mg Tabs (Phentermine hcl) .... Take 1 tablet by mouth once a day   Patient Instructions: 1)  Please schedule a follow-up appointment in 2 months. 2)  It is important that you exercise regularly at least 20 minutes 5 times a week. If you develop chest pain, have severe difficulty breathing, or feel very tired , stop exercising immediately and seek medical attention. 3)  You need to lose weight. Consider a lower calorie diet and regular exercise.  4)  You are being referred to the pain clinic per your request.   Prescriptions: SINGULAIR 10 MG  TABS (MONTELUKAST SODIUM) one tab by mouth once daily  #30 x 5   Entered by:   Worthy Keeler LPN   Authorized by:   Syliva Overman MD   Signed by:   Worthy Keeler LPN on 16/11/9602   Method used:   Electronically to        [facility]* (retail)       [address] [place], [place]  [address]       Ph: [phone]       Fax: [phone]   RxID:   [id number] ADIPEX-P 37.5 MG TABS (PHENTERMINE HCL) Take 1 tablet by mouth once a day  #42 x 0   Entered and Authorized by:   Syliva Overman MD   Signed by:   Syliva Overman MD on 01/09/2008   Method used:   Printed then faxed to ...       [facility]* (retail)       [address]       [place], [place]  [address]        Ph: [phone]       Fax: [phone]   RxID:   [id number] DARVOCET-N 100 100-650 MG TABS (PROPOXYPHENE N-APAP) Take 1 tablet by mouth three times a day  #90 x 5   Entered and Authorized by:   Syliva Overman MD   Signed by:   Syliva Overman MD on 01/09/2008   Method used:   Printed then faxed to ...       [facility]* (retail)       [address]       [place], [place]  [address]       Ph: [phone]       Fax: [phone]   RxID:   [id number] ROBAXIN 500 MG TABS (METHOCARBAMOL) Take 1 tablet by mouth three times a day  #90 x 4   Entered and Authorized by:   Syliva Overman MD   Signed by:   Syliva Overman MD on 01/09/2008   Method used:   Electronically to        [facility]* (retail)       [address]       [place], [place]  [address]       Ph: [phone]       Fax: [phone]   RxID:   [id number]  ]  Medication Administration  Injection # 1:    Medication: Ketorolac-Toradol 15mg     Diagnosis: BACK PAIN, CHRONIC (ICD-724.5)    Route:  IM    Site: LUOQ gluteus    Exp Date: 04/27/2009    Lot #: [id number]    Mfr: NOVAPLUS    Comments: TORADOL 60MG  GIVEN    Patient tolerated injection without complications    Given by: Worthy Keeler LPN (January 09, 2008 3:43 PM)  Orders Added: 1)  Est. Patient Level IV [13086] 2)  Pain Clinic Referral [Pain] 3)  Ketorolac-Toradol 15mg  [J1885] 4)  Admin of Therapeutic Inj  intramuscular or subcutaneous [57846]

## 2010-03-29 NOTE — Progress Notes (Signed)
Summary: [facility]   Imported By: Lind Guest 04/15/2009 14:34:58  _____________________________________________________________________  External Attachment:    Type:   Image     Comment:   External Document

## 2010-03-29 NOTE — Letter (Signed)
Summary: Pre-Cert notification Outpatient surgery  Pre-Cert notification Outpatient surgery   Imported By: Cammie Sickle 08/14/2008 12:08:55  _____________________________________________________________________  External Attachment:    Type:   Image     Comment:   External Document

## 2010-03-29 NOTE — Letter (Signed)
Summary: letter faxed to [facility]  letter faxed to [facility]   Imported By: Lind Guest 03/30/2009 08:47:55  _____________________________________________________________________  External Attachment:    Type:   Image     Comment:   External Document

## 2010-03-29 NOTE — Assessment & Plan Note (Signed)
Summary: office visit   Vital Signs:  Patient profile:   45 year old female Menstrual status:  hysterectomy Height:      63.5 inches Weight:      332.25 pounds BMI:     58.14 O2 Sat:      95 % Pulse rate:   86 / minute Pulse rhythm:   regular Resp:     16 per minute BP sitting:   130 / 90 Cuff size:   xl  Vitals Entered By: Everitt Amber (March 17, 2009 9:42 AM)  Nutrition Counseling: Patient's BMI is greater than 25 and therefore counseled on weight management options. CC: Follow up chronic problems, has been breaking out on upper legs and on her right hand and its been itching   Primary Care Provider:  Syliva Overman MD  CC:  Follow up chronic problems and has been breaking out on upper legs and on her right hand and its been itching.  History of Present Illness: Ms. Stepka states thAt she has been suffering from increased itch and rash, even in her sleep over the past 1 to 2 weeks.She wants help for this. She has also started the process of getting into a program for bariatric surgery. She has brought in some forms for completion , the program also requests a letter of recommendation. Which she certainly qualifies for. She continues to have significant joint pain, primarily affecting the weight bearing joints, which reduce her mobility. he deneis any recentfever or chills. She rep[orts persitent depression, primarily due to her obesity and irt's negative effects on her health. She is not suicidal or homicidal.  Current Medications (verified): 1)  Singulair 10 Mg  Tabs (Montelukast Sodium) .... One Tab By Mouth Once Daily 2)  Omeprazole 20 Mg  Cpdr (Omeprazole) .... One Cap By Mouth Two Times A Day 3)  Phentermine Hcl 37.5 Mg Caps (Phentermine Hcl) .... Take 1 Capsule By Mouth Once A Day 4)  Restoril 30 Mg Caps (Temazepam) .... Take 1 Capsule By Mouth At Bedtime 5)  Lovastatin 40 Mg Tabs (Lovastatin) .... Two Tablets At Bedtime 6)  Tylenol Arthritis Pain 650 Mg Cr-Tabs  (Acetaminophen) .... Take 1 Tablet By Mouth Two Times A Day 7)  Gabapentin 300 Mg Caps (Gabapentin) .... One  in The Morning At 8am, Second At At 2pm , Then Three At Bedtime 8)  Meloxicam 15 Mg Tabs (Meloxicam) .... Take 1 Tablet By Mouth Once A Day 9)  Tizanidine Hcl 4 Mg Tabs (Tizanidine Hcl) .... Take 1 Tablet By Mouth Three Times A Day 10)  Gnp Vitamin B-6 100 Mg Tabs (Pyridoxine Hcl) .... Take 1 Tablet By Mouth Once A Day 11)  Cod Liver Oil  Caps (Cod Liver Oil) .... Take 1 Tablet By Mouth Once A Day  Allergies (verified): No Known Drug Allergies  Review of Systems      See HPI General:  Complains of fatigue; denies chills and fever. Eyes:  Denies blurring and discharge. ENT:  Denies hoarseness, nasal congestion, sinus pressure, and sore throat. CV:  Complains of shortness of breath with exertion; denies chest pain or discomfort, palpitations, and swelling of feet. Resp:  Complains of shortness of breath and wheezing; denies cough and sputum productive; occasional wheezing, asthma well controlled. GI:  Denies abdominal pain, constipation, diarrhea, nausea, and vomiting. GU:  Complains of incontinence; denies dysuria and urinary frequency. MS:  Complains of joint pain, low back pain, mid back pain, and stiffness. Derm:  Complains of itching and rash;  puritic rash between legs, and also has started scratching in her sleep x 1 week. Neuro:  Denies headaches, seizures, and sensation of room spinning. Psych:  See HPI. Endo:  Denies cold intolerance, excessive hunger, excessive thirst, excessive urination, heat intolerance, polyuria, and weight change. Heme:  Denies abnormal bruising and bleeding. Allergy:  Complains of seasonal allergies.  Physical Exam  General:  Well-developed,MORBIDLY OBESED,in no acute distress; alert,appropriate and cooperative throughout examination HEENT: No facial asymmetry,  EOMI, No sinus tenderness, TM's Clear, oropharynx  pink and moist.   Chest: Clear to  auscultation bilaterally.  CVS: S1, S2, No murmurs, No S3.   Abd: Soft, Nontender.  MS: decreased  ROM spine, hips, shoulders and knees.  Ext: No edema.   CNS: CN 2-12 intact, power tone and sensation normal throughout.   Skin: Intact, hyperpigmented maculopapular rash on forearms and thighs Psych: Good eye contact, normal affect.  Memory intact, not anxious but depressed appearing.    Impression & Recommendations:  Problem # 1:  ECZEMA (ICD-692.9) Assessment Deteriorated  Her updated medication list for this problem includes:    Betamethasone Dipropionate 0.05 % Crea (Betamethasone dipropionate) .Marland Kitchen... Apply to affected area twice daily as needed  Problem # 2:  INSOMNIA (ICD-780.52) Assessment: Deteriorated  Her updated medication list for this problem includes:    Restoril 30 Mg Caps (Temazepam) .Marland Kitchen... Take 1 capsule by mouth at bedtime  Discussed sleep hygiene.   Problem # 3:  BACK PAIN, LUMBAR, WITH RADICULOPATHY (ICD-724.4) Assessment: Unchanged  The following medications were removed from the medication list:    Ultram 50 Mg Tabs (Tramadol hcl) ..... One by mouth q 4 hrs as needed pain Her updated medication list for this problem includes:    Tylenol Arthritis Pain 650 Mg Cr-tabs (Acetaminophen) .Marland Kitchen... Take 1 tablet by mouth two times a day    Meloxicam 15 Mg Tabs (Meloxicam) .Marland Kitchen... Take 1 tablet by mouth once a day    Tizanidine Hcl 4 Mg Tabs (Tizanidine hcl) .Marland Kitchen... Take 1 tablet by mouth three times a day  Problem # 4:  HYPERLIPIDEMIA (ICD-272.4) Assessment: Comment Only  Her updated medication list for this problem includes:    Lovastatin 40 Mg Tabs (Lovastatin) .Marland Kitchen..Marland Kitchen Two tablets at bedtime  Labs Reviewed: SGOT: 17 (11/27/2008)   SGPT: 20 (11/27/2008)   HDL:53 (03/16/2009), 59 (11/27/2008)  LDL:164 (03/16/2009), 159 (11/27/2008)  Chol:242 (03/16/2009), 238 (11/27/2008)  Trig:126 (03/16/2009), 100 (11/27/2008)  Problem # 5:  DEPRESSION (ICD-311) Assessment:  Unchanged  Her updated medication list for this problem includes:    Hydroxyzine Hcl 25 Mg Tabs (Hydroxyzine hcl) .Marland Kitchen... Take 1 tab by mouth at bedtime  Problem # 6:  OBESITY, UNSPECIFIED (ICD-278.00) Assessment: Deteriorated  Ht: 63.5 (03/17/2009)   Wt: 332.25 (03/17/2009)   BMI: 58.14 (03/17/2009), pt has now decided to have surgery which is certainly appropriate for her  Problem # 7:  ASTHMA, UNSPECIFIED, UNSPECIFIED STATUS (ICD-493.90) Assessment: Improved  Her updated medication list for this problem includes:    Singulair 10 Mg Tabs (Montelukast sodium) ..... One tab by mouth once daily    Duoneb 0.5-2.5 (3) Mg/63ml Soln (Ipratropium-albuterol) ..... Use with neb machine two times a day  Complete Medication List: 1)  Singulair 10 Mg Tabs (Montelukast sodium) .... One tab by mouth once daily 2)  Omeprazole 20 Mg Cpdr (Omeprazole) .... One cap by mouth two times a day 3)  Phentermine Hcl 37.5 Mg Caps (Phentermine hcl) .... Take 1 capsule by mouth once a day 4)  Restoril 30 Mg Caps (Temazepam) .... Take 1 capsule by mouth at bedtime 5)  Lovastatin 40 Mg Tabs (Lovastatin) .... Two tablets at bedtime 6)  Tylenol Arthritis Pain 650 Mg Cr-tabs (Acetaminophen) .... Take 1 tablet by mouth two times a day 7)  Gabapentin 300 Mg Caps (Gabapentin) .... One  in the morning at 8am, second at at 2pm , then three at bedtime 8)  Meloxicam 15 Mg Tabs (Meloxicam) .... Take 1 tablet by mouth once a day 9)  Tizanidine Hcl 4 Mg Tabs (Tizanidine hcl) .... Take 1 tablet by mouth three times a day 10)  Gnp Vitamin B-6 100 Mg Tabs (Pyridoxine hcl) .... Take 1 tablet by mouth once a day 11)  Cod Liver Oil Caps (Cod liver oil) .... Take 1 tablet by mouth once a day 12)  Betamethasone Dipropionate 0.05 % Crea (Betamethasone dipropionate) .... Apply to affected area twice daily as needed 13)  Hydroxyzine Hcl 25 Mg Tabs (Hydroxyzine hcl) .... Take 1 tab by mouth at bedtime 14)  Duoneb 0.5-2.5 (3) Mg/66ml Soln  (Ipratropium-albuterol) .... Use with neb machine two times a day  Patient Instructions: 1)  Please schedule a follow-up appointment in 3 months. 2)  It is important that you exercise regularly at least 20 minutes 5 times a week. If you develop chest pain, have severe difficulty breathing, or feel very tired , stop exercising immediately and seek medical attention. 3)  You need to lose weight. Consider a lower calorie diet and regular exercise.  4)  You will get meds for the rash and itcjh. Prescriptions: DUONEB 0.5-2.5 (3) MG/3ML SOLN (IPRATROPIUM-ALBUTEROL) Use with neb machine two times a day  #100 x 2   Entered by:   Everitt Amber   Authorized by:   Syliva Overman MD   Signed by:   Everitt Amber on 03/17/2009   Method used:   Electronically to        [facility]* (retail)       [address] [place], [place]  [address]       Ph: [phone]       Fax: [phone]   RxID:   [id number] HYDROXYZINE HCL 25 MG TABS (HYDROXYZINE HCL) Take 1 tab by mouth at bedtime  #30 x 3   Entered and Authorized by:   Syliva Overman MD   Signed by:   Syliva Overman MD on 03/17/2009   Method used:   Electronically to        [facility]* (retail)       [address]       [place], [place]  [address]       Ph: [phone]       Fax: [phone]   RxID:   [id number] BETAMETHASONE DIPROPIONATE 0.05 % CREA (BETAMETHASONE DIPROPIONATE) apply to affected area twice daily as needed  #30 gm x 1   Entered and Authorized by:   Syliva Overman MD   Signed by:   Syliva Overman MD on 03/17/2009   Method used:   Electronically to        [facility]* (retail)       [address]       [place], [place]  [address]       Ph: [phone]       Fax: [phone]  RxID:   [id number]    Orders Added: 1)  Est. Patient Level III [36644]

## 2010-03-29 NOTE — Progress Notes (Signed)
Summary: ALL MEDS REFILLED  Phone Note Call from Patient   Summary of Call: ALL OF HER MEDICINES HAS NO REFILLS AND NEEDS THEM FILLED AND HAS APPT 11.3.11 WITH DR  Robbie Lis APOT  CAL;L BACKAT [phone] Initial call taken by: Lind Guest,  December 21, 2009 9:30 AM    Prescriptions: METHOCARBAMOL 500 MG TABS (METHOCARBAMOL) take 2 tablets every 6 hrs as needed for muscle spasm  #60 x 0   Entered by:   Adella Hare LPN   Authorized by:   Syliva Overman MD   Signed by:   Adella Hare LPN on 45/40/9811   Method used:   Electronically to        [facility] (retail)       [address] [place], [place]  [address]       Ph: [phone]       Fax: [phone]   RxID:   [id number] CLOTRIMAZOLE-BETAMETHASONE 1-0.05 % CREA (CLOTRIMAZOLE-BETAMETHASONE) apply twice daily to affected areas  #45 gm x 0   Entered by:   Adella Hare LPN   Authorized by:   Syliva Overman MD   Signed by:   Adella Hare LPN on 66/44/0347   Method used:   Electronically to        [facility] (retail)       [address] [place], [place]  [address]       Ph: [phone]       Fax: [phone]   RxID:   [id number] DUONEB 0.5-2.5 (3) MG/3ML SOLN (IPRATROPIUM-ALBUTEROL) Use with neb machine two times a day  #100 x 0   Entered by:   Adella Hare LPN   Authorized by:   Syliva Overman MD   Signed by:   Adella Hare LPN on 22/03/5425   Method used:   Electronically to        [facility] (retail)       [address] [place], [place]  [address]       Ph: [phone]       Fax: [phone]   RxID:   [id number] HYDROXYZINE HCL 25 MG TABS (HYDROXYZINE HCL) Take 1 tab by mouth at bedtime  #30 x 0   Entered by:   Adella Hare LPN   Authorized by:   Syliva Overman MD   Signed by:   Adella Hare LPN on 18/29/9371   Method used:   Electronically to        [facility]* (retail)       [address] [place], [place]  [address]       Ph: [phone]       Fax: [phone]   RxID:   [id number] MELOXICAM 15 MG TABS (MELOXICAM) Take 1 tablet by mouth once a day  #30 x 0   Entered by:   Adella Hare LPN   Authorized by:   Syliva Overman MD   Signed by:   Adella Hare LPN on 76/19/5093   Method used:   Electronically to        [facility] (retail)       [address]  [place], [place]  [address]       Ph: [phone]       Fax: [phone]   RxID:   [id number] GABAPENTIN 300 MG CAPS (GABAPENTIN) one  in the morning at 8am, second at at 2pm , then three at bedtime  #150 x 0   Entered by:   Adella Hare LPN   Authorized by:   Syliva Overman MD   Signed by:   Adella Hare LPN on 02/72/5366   Method used:   Electronically to        [facility] (retail)       [address] [place], [place]  [address]       Ph: [phone]       Fax: [phone]   RxID:   [id number] LOVASTATIN 40 MG TABS (LOVASTATIN) two tablets at bedtime  #60 x 0   Entered by:   Adella Hare LPN   Authorized by:   Syliva Overman MD   Signed by:   Adella Hare LPN on 23/55/7322   Method used:   Electronically to        [facility] (retail)       [address] [place], [place]  [address]       Ph: [phone]       Fax: [phone]   RxID:   [id number] OMEPRAZOLE 20 MG  CPDR (OMEPRAZOLE) one cap by mouth two times a day  #60 x 0   Entered by:   Adella Hare LPN   Authorized by:   Syliva Overman MD   Signed by:   Adella Hare LPN on 50/10/3816   Method used:   Electronically to        [facility] (retail)       [address] [place], [place]  [address]       Ph: [phone]       Fax: [phone]   RxID:   [id number] SINGULAIR 10 MG   TABS (MONTELUKAST SODIUM) one tab by mouth once daily  #30 Each x 0   Entered by:   Adella Hare LPN   Authorized by:   Syliva Overman MD   Signed by:   Adella Hare LPN on 54/00/8676   Method used:   Electronically to        [facility] (retail)       [address]       [place], [place]  [address]       Ph: [phone]       Fax: [phone]   RxID:   [id number]

## 2010-03-29 NOTE — Progress Notes (Signed)
Summary: LEG HURTS  Phone Note Call from Patient   Summary of Call: PAIN UNDER STOMACH BUT IN HER RIGHT LEG WANTS TO KNOW WILL YOU CALL SOMETHING IN AT [facility]. BEEN HURTING 3 DAYS AND JUST SEEMS TO BE Katha Cabal CALL AT [phone] Initial call taken by: Lind Guest,  September 04, 2008 9:28 AM  Follow-up for Phone Call        Pt had surgery on 6/18 on left knee with dr. Romeo Apple. she has a 3 day h/o pain in r groin  can hardly stand , using the cane, never had pain like this before , no numbness in leg, c/o urinary frequency. She denies swelling or pain i either calf, cough or dyspnea. I advised Ed or urgent care eval, anthat I wouldauthorise a refill on her darvocet   Follow-up by: Syliva Overman MD,  September 04, 2008 3:35 PM  Additional Follow-up for Phone Call Additional follow up Details #1::        pls refill darvocet x1 Additional Follow-up by: Syliva Overman MD,  September 04, 2008 3:36 PM      Prescriptions: DARVOCET-N 50 50-325 MG TABS (PROPOXYPHENE N-APAP) one tab by mouth two times a day prn  #20 x 0   Entered by:   Worthy Keeler LPN   Authorized by:   Syliva Overman MD   Signed by:   Worthy Keeler LPN on 09/81/1914   Method used:   Printed then faxed to ...       [facility]* (retail)       [address]       [place], [place]  [address]       Ph: [phone]       Fax: [phone]   RxID:   [id number]

## 2010-03-29 NOTE — Progress Notes (Signed)
Summary: SICK  Phone Note Call from Patient   Summary of Call: HEADCOLD  CONGESTED  DIA. WANTS SOMETHING CALLED IN AT [facility] CALL BACK AT  [phone] Initial call taken by: Lind Guest,  Jul 17, 2008 11:53 AM  Follow-up for Phone Call        doc duration of symptoms, if clear,drainage or sputum and no fever or chills, only tessalon perles 100mg  Take 1 capsule by mouth three times a day #30, if yellow/green drainage or sputum or fever or chills erx septrads Take 1 tablet by mouth two times a day #20 also pls Follow-up by: Syliva Overman MD,  Jul 17, 2008 12:34 PM  Additional Follow-up for Phone Call Additional follow up Details #1::        Returned call, left message Additional Follow-up by: Everitt Amber,  Jul 17, 2008 3:31 PM    Additional Follow-up for Phone Call Additional follow up Details #2::    Returned call, left message Follow-up by: Everitt Amber,  Jul 20, 2008 1:29 PM  Additional Follow-up for Phone Call Additional follow up Details #3:: Details for Additional Follow-up Action Taken: Returned call, left message Additional Follow-up by: Everitt Amber,  Jul 21, 2008 9:15 AM    Appended Document: SICK Patient has drainage and congestion but it is clear. No signs of infection. Sending tessalon perles to [place]  Appended Document: SICK    Clinical Lists Changes  Medications: Added new medication of TESSALON PERLES 100 MG CAPS (BENZONATATE) Take 1 tablet by mouth three times a day - Signed Rx of TESSALON PERLES 100 MG CAPS (BENZONATATE) Take 1 tablet by mouth three times a day;  #30 x 0;  Signed;  Entered by: Everitt Amber;  Authorized by: Syliva Overman MD;  Method used: Electronically to [facility]*, [address], [place], [place], [place]  [address], Ph: [phone], Fax: [phone]    Prescriptions: TESSALON PERLES 100 MG CAPS (BENZONATATE) Take 1 tablet by mouth three times a day  #30 x 0   Entered by:   Everitt Amber   Authorized by:    Syliva Overman MD   Signed by:   Everitt Amber on 07/21/2008   Method used:   Electronically to        [facility] (retail)       [address] [place], [place]  [address]       Ph: [phone]       Fax: [phone]   RxID:   [id number]

## 2010-03-29 NOTE — Letter (Signed)
Summary: PAIN & REHABILATIVE  PAIN & REHABILATIVE   Imported By: Lind Guest 12/11/2008 09:14:00  _____________________________________________________________________  External Attachment:    Type:   Image     Comment:   External Document

## 2010-03-29 NOTE — Letter (Signed)
Summary: RELEASE OF MEDICAL INFO  RELEASE OF MEDICAL INFO   Imported By: Lind Guest 06/29/2009 13:01:13  _____________________________________________________________________  External Attachment:    Type:   Image     Comment:   External Document

## 2010-03-29 NOTE — Assessment & Plan Note (Signed)
Summary: bi shoulder pain xr [facility] sept 10/aarp/simpson/bsf   Visit Type:  Follow-up, new problem  Referring Provider:  Dr Rosine Abe   CC:  Neck Pain .  History of Present Illness: I saw Jessica Stevens in the office today for a followup visit.  She is a 45 years old woman with the complaint of:  NEW PROBLEM.  BILATERAL SHOULDER PAIN.  chief complaint: Bilateral shoulder pain.  she's had bilateral shoulder and aching neck pain at the base of the cervical spine radiating across his shoulders down into both hands for about 3 months. She gave her pain an 8/10. She says is constant in the arms and hands, and it comes and goes in the neck area. She was treated at the pain clinic. It wasn't helping her. She stopped.  I have treated her with physical therapy in the past, and she says that didn't help.  -xrays done & where: [name] of left shoulder on 09/10 and of C-spine on 10/10.   Allergies: No Known Drug Allergies  Past History:  Past Medical History: Last updated: 10/16/2007 Current Problems:  H N P-CERVICAL (ICD-722.0) RUPTURE ROTATOR CUFF (ICD-727.61) Current Problems:  CERVICAL RADICULOPATHY (ICD-723.4) ANKLE PAIN, LEFT (ICD-719.47) KNEE, ARTHRITIS, DEGEN./OSTEO (ICD-715.96) KNEE PAIN (ICD-719.46) CARPAL TUNNEL SYNDROME (ICD-354.0) URINARY INCONTINENCE (ICD-788.30) GYNECOMASTIA (ICD-611.1) ASTHMA, UNSPECIFIED, UNSPECIFIED STATUS (ICD-493.90) DEPRESSION (ICD-311) OSTEOARTHRITIS, KNEE (ICD-715.96) BACK PAIN, CHRONIC (ICD-724.5) OBESITY, UNSPECIFIED (ICD-278.00) HYPERLIPIDEMIA (ICD-272.4) H N P-CERVICAL (ICD-722.0) RUPTURE ROTATOR CUFF (ICD-727.61)  Past Surgical History: Last updated: 09/09/2008 Partial hysterectomy Tube inserted in right ear (2003) Arthroscopy left knee Romeo Apple)  08/14/2008  Family History: Last updated: 10/16/2007 Mom HTN,DjD,Dm, muscle spasm Dad had left nephrectomy secondary cancer sisters x4 healthy  brothers x2 healthy  Social  History: Last updated: 10/16/2007 Patient is single.  unemployed 1 child Never Smoked Alcohol use-no Drug use-no  Risk Factors: Caffeine Use: 0 (05/21/2007)  Risk Factors: Smoking Status: never (05/18/2008)  Review of Systems General:  Denies weight loss, weight gain, fever, and chills. Cardiac :  Denies chest pain. Resp:  Denies short of breath. Neuro:  See HPI. MS:  See HPI.  Physical Exam  Additional Exam:  The patient is well developed well nourished with no deformities. she is obese Awake, alert and oriented x 3. Mood is normal. Sensory exam normal in both upper extremities Perfusion of the upper extremities is normal Skin is intact in the head and neck area thoracic region upper arms Lymph nodes normal in both upper extremities MSK: cervical spine and shoulders  There is painful palpation of the cervical spine, especially at its base, as well as in the upper thoracic region in both supraspinatus fossae.  His normal reflexes in the elbow and wrist and they are equal.  She has normal grip strength. She has painless range of motion of the shoulders shows decreased range of motion of the cervical spine      Impression & Recommendations:  Problem # 1:  CERVICAL RADICULOPATHY (ICD-723.4) she has a neck x-ray, which showed that she has a congenital C3-C4, fusion, and she also has a degenerative cervical spine.  She has failed physical therapy and pain clinic management. I have really nothing else to offer her at this time. I did give her some pain medicine. The last until she gets to see a specialist. No further followup is necessary. Workup has been completed regarding her shoulders, and possibly for the shoulder surgery.   Orders: Neurosurgeon Referral (Neurosurgeon) Est. Patient Level IV (84132)  Medications  Added to Medication List This Visit: 1)  Nucynta 50 Mg Tabs (Tapentadol hcl) .Marland Kitchen.. 1 by mouth q 4 as needed  Patient Instructions: 1)  MRI Cervical  spine [Dr to call results] 2)  Referral to Neurosurgery after MRI 3)  Take tylenol or advil for pain  4)  Use heat as needed  Prescriptions: NUCYNTA 50 MG TABS (TAPENTADOL HCL) 1 by mouth q 4 as needed  #90 x 0   Entered and Authorized by:   Fuller Canada MD   Signed by:   Fuller Canada MD on 02/03/2009   Method used:   Print then Give to Patient   RxID:   [id number]

## 2010-03-29 NOTE — Progress Notes (Signed)
Summary: Rx question  Phone Note Call from Patient   Caller: Patient Summary of Call: Patient stopped in today with updated insurance card; entered/scanned.  Referral in progress. Has question about the Tramadol medication; states that it is not helping much.  Her pharmacy is [facility].   Patient PH # rec'd today: [phone] - I Reached a business  Prev Ph # [phone] No Longer Correct Initial call taken by: Cammie Sickle,  March 02, 2009 4:18 PM  Follow-up for Phone Call        if stronger medicines needed. The patient can inquire with her primary care physician or the referral physician when she sees him Follow-up by: Fuller Canada MD,  March 02, 2009 4:20 PM  Additional Follow-up for Phone Call Additional follow up Details #1::        Several attempts to reach patient - unable to reach patient at ph # given or at previous #'s in system to relay Dr Keerthana Vanrossum's advice.  Additional Follow-up by: Cammie Sickle,  March 12, 2009 10:33 AM

## 2010-03-29 NOTE — Miscellaneous (Signed)
Summary: Rehab Report  Rehab Report   Imported By: Jacklynn Ganong 11/28/2007 16:19:58  _____________________________________________________________________  External Attachment:    Type:   Image     Comment:   Request for OK to get cervical traction home unit

## 2010-03-29 NOTE — Progress Notes (Signed)
  Phone Note From Pharmacy   Caller: [facility]* Summary of Call: patient requesting refill on phentermine Initial call taken by: Worthy Keeler LPN,  October 22, 2008 4:14 PM  Follow-up for Phone Call        refill x 1 pls Follow-up by: Syliva Overman MD,  October 22, 2008 6:32 PM  Additional Follow-up for Phone Call Additional follow up Details #1::        rx sent  Additional Follow-up by: Worthy Keeler LPN,  October 23, 2008 8:58 AM    Prescriptions: PHENTERMINE HCL 37.5 MG CAPS (PHENTERMINE HCL) Take 1 capsule by mouth once a day  #30 x 0   Entered by:   Worthy Keeler LPN   Authorized by:   Syliva Overman MD   Signed by:   Worthy Keeler LPN on 16/11/9602   Method used:   Printed then faxed to ...       [facility]* (retail)       [address]       [place], [place]  [address]       Ph: [phone]       Fax: [phone]   RxID:   [id number]

## 2010-03-29 NOTE — Assessment & Plan Note (Signed)
Summary: F UP   Vital Signs:  Patient Profile:   45 Years Old Female Height:     63.5 inches (161.29 cm) Weight:      324.44 pounds (147.47 kg) BMI:     56.77 BSA:     2.39 Pulse rate:   87 / minute Resp:     16 per minute BP sitting:   140 / 80  (left arm)  Pt. in pain?   yes    Location:   lower back and leg    Intensity:   9    Type:       sharp and aches  Vitals Entered By: Worthy Keeler LPN (March 18, 2008 10:55 AM)                  Chief Complaint:  follow up chronic problems.  History of Present Illness: Pt continues to have  uncontrolled back pain and is now in the pain  clinic.  She has lost weight on the phentermine and wants to continue. She denies any recent fever or chills. she denies depression, anxiety or insomnia.     Updated Prior Medication List: SINGULAIR 10 MG  TABS (MONTELUKAST SODIUM) one tab by mouth once daily OMEPRAZOLE 20 MG  CPDR (OMEPRAZOLE) one cap by mouth two times a day OXYBUTYNIN CHLORIDE 5 MG  TB24 (OXYBUTYNIN CHLORIDE) one tab by mouth once daily NEURONTIN 100 MG  CAPS (GABAPENTIN) two caps by mouth at bedtime ROBAXIN 500 MG  TABS (METHOCARBAMOL) 1 by mouth q 8 hrs as needed spasms neck PHENTERMINE HCL 37.5 MG  CAPS (PHENTERMINE HCL) Take 1 tablet by mouth once a day ZOLPIDEM TARTRATE 10 MG TABS (ZOLPIDEM TARTRATE) Take 1 tab by mouth at bedtime DARVOCET-N 100 100-650 MG TABS (PROPOXYPHENE N-APAP) Take 1 tablet by mouth three times a day TRAMADOL HCL 50 MG TABS (TRAMADOL HCL) one tab by mouth three times a day as needed LOVASTATIN 40 MG TABS (LOVASTATIN) one tab by mouth qhs FLOXIN OTIC 0.3 % SOLN (OFLOXACIN) 5 drops in ears two times a day VOLTAREN 1 % GEL (DICLOFENAC SODIUM) uad qid  Current Allergies: No known allergies      Review of Systems  General      Denies chills, fatigue, fever, loss of appetite, malaise, sleep disorder, sweats, weakness, and weight loss.  ENT      Denies earache, hoarseness, sinus  pressure, and sore throat.  CV      Denies chest pain or discomfort, palpitations, and swelling of feet.  Resp      Denies cough, sputum productive, and wheezing.  GI      Denies abdominal pain, constipation, diarrhea, nausea, and vomiting.  GU      Denies dysuria and urinary frequency.  MS      See HPI  Derm      Denies itching, lesion(s), and rash.  Psych      See HPI  Allergy      Denies hives or rash and sneezing.   Physical Exam  General:     Alert and oriented x3; no cardiopulmonary distress; HEENT; no sinus tenderness, TM clear, EOMI, oropharynx pink and moist. neck supple with no adenopathy; MS: Decreased ROM spine, hips,  and knees;  Psych: Good eye contact, normal affect, memory intact, not anxious or depressed appearing; CNS: CN2-12 grossly intact.Power, tone and sensation normal RESP;: chest clear to ascultation CVS: S1,S2, no murmurs. GI: Abdomen soft and non tender EXTREMITIES: No edema SKIN: Intact, no visible  lesions or rashes. CNS: Grossly intact.     Impression & Recommendations:  Problem # 1:  BACK PAIN, CHRONIC (ICD-724.5) Assessment: Deteriorated  The following medications were removed from the medication list:    Robaxin 500 Mg Tabs (Methocarbamol) .Marland Kitchen... Take 1 tablet by mouth three times a day    Darvocet-n 100 100-650 Mg Tabs (Propoxyphene n-apap) .Marland Kitchen... Take 1 tablet by mouth three times a day  Her updated medication list for this problem includes:    Robaxin 500 Mg Tabs (Methocarbamol) .Marland Kitchen... 1 by mouth q 8 hrs as needed spasms neck    Tramadol Hcl 50 Mg Tabs (Tramadol hcl) ..... One tab by mouth three times a day as needed  Orders: Depo- Medrol 80mg  (J1040) Ketorolac-Toradol 15mg  (Z6109) Admin of Therapeutic Inj  intramuscular or subcutaneous (60454) Neurosurgeon Referral (Neurosurgeon)   Problem # 2:  OBESITY, UNSPECIFIED (ICD-278.00) Assessment: Improved  Problem # 3:  HYPERLIPIDEMIA (ICD-272.4) Assessment: Comment  Only  The following medications were removed from the medication list:    Lipitor 40 Mg Tabs (Atorvastatin calcium) ..... One tab by mouth at bedtime  Her updated medication list for this problem includes:    Lovastatin 40 Mg Tabs (Lovastatin) ..... One tab by mouth qhs  Labs Reviewed: Chol: 222 (11/19/2007)   HDL: 69 (11/19/2007)   LDL: 126 (11/19/2007)   TG: 135 (11/19/2007) SGOT: 26 (11/19/2007)   SGPT: 29 (11/19/2007)   Complete Medication List: 1)  Singulair 10 Mg Tabs (Montelukast sodium) .... One tab by mouth once daily 2)  Omeprazole 20 Mg Cpdr (Omeprazole) .... One cap by mouth two times a day 3)  Oxybutynin Chloride 5 Mg Tb24 (Oxybutynin chloride) .... One tab by mouth once daily 4)  Neurontin 100 Mg Caps (Gabapentin) .... Two caps by mouth at bedtime 5)  Robaxin 500 Mg Tabs (Methocarbamol) .Marland Kitchen.. 1 by mouth q 8 hrs as needed spasms neck 6)  Phentermine Hcl 37.5 Mg Caps (Phentermine hcl) .... Take 1 tablet by mouth once a day 7)  Zolpidem Tartrate 10 Mg Tabs (Zolpidem tartrate) .... Take 1 tab by mouth at bedtime 8)  Tramadol Hcl 50 Mg Tabs (Tramadol hcl) .... One tab by mouth three times a day as needed 9)  Lovastatin 40 Mg Tabs (Lovastatin) .... One tab by mouth qhs 10)  Floxin Otic 0.3 % Soln (Ofloxacin) .... 5 drops in ears two times a day 11)  Voltaren 1 % Gel (Diclofenac sodium) .... Uad qid 12)  Phentermine Hcl 37.5 Mg Tabs (Phentermine hcl) .... One tab by mouth once daily   Patient Instructions: 1)  Please schedule a follow-up appointment in 2 months. 2)  You will be referred to a neurosurgeon about your back. 3)  You will get injections today, and all pain meds need to come from the pain clinic only. 4)  Pls follow a low fat diet a,d start simvastatin at night for cholesterol. 5)  congrats on weight loss, keep it up.   Prescriptions: PHENTERMINE HCL 37.5 MG TABS (PHENTERMINE HCL) one tab by mouth once daily  #42 x 0   Entered and Authorized by:   Syliva Overman MD   Signed by:   Syliva Overman MD on 03/22/2008   Method used:   Handwritten   RxID:   [id number]     Medication Administration  Injection # 1:    Medication: Depo- Medrol 80mg     Diagnosis: BACK PAIN, CHRONIC (ICD-724.5)    Route: IM    Site: RUOQ gluteus  Exp Date: 9/10    Lot #: [id number]    Mfr: sicor    Patient tolerated injection without complications    Given by: Worthy Keeler LPN (March 18, 2008 12:05 PM)  Injection # 2:    Medication: Ketorolac-Toradol 15mg     Diagnosis: BACK PAIN, CHRONIC (ICD-724.5)    Route: IM    Site: LUOQ gluteus    Exp Date: 04/27/2009    Lot #: [id number]    Comments: toradol 60mg  given    Patient tolerated injection without complications    Given by: Worthy Keeler LPN (March 18, 2008 12:14 PM)  Orders Added: 1)  Est. Patient Level IV [91478] 2)  Depo- Medrol 80mg  [J1040] 3)  Ketorolac-Toradol 15mg  [J1885] 4)  Admin of Therapeutic Inj  intramuscular or subcutaneous [96372] 5)  Neurosurgeon Referral [Neurosurgeon]  Appended Document: F UP pls cancel neurosurgery appt for now, the pt gave the impression that pain management felt it was appropriate to refer her at this time, but on review of the note from pain management I see nothing to suggest a referral at this time, you can also let the pt know I am holding on the referral at this time, based on the note I received, thanks  Appended Document: F UP pt was notified and told we where going to cancel appt for neuro. But would still hold on to referral

## 2010-03-29 NOTE — Assessment & Plan Note (Signed)
Summary: office visit   Vital Signs:  Patient profile:   45 year old female Menstrual status:  hysterectomy Height:      63.5 inches Weight:      328.06 pounds BMI:     57.41 Pulse rate:   69 / minute Pulse rhythm:   regular Resp:     16 per minute BP sitting:   110 / 70  (left arm)  Vitals Entered By: Worthy Keeler LPN (December 15, 2008 9:28 AM)  Nutrition Counseling: Patient's BMI is greater than 25 and therefore counseled on weight management options. CC: follow-up visit Is Patient Diabetic? No Pain Assessment Patient in pain? yes     Location: back Intensity: 5 Type: aching Onset of pain  Constant   CC:  follow-up visit.  History of Present Illness: Pt was seen at [facility] on te 08/14 /2010, she got an injection in her left groin which she states has helped alot.She3 is no longer followed at the pain clinic and will be on non narcotic meds for her back pain. She is requesting referral to the local neurologist for injections in her forearms for carpal tunnel pain which she had been getting. She has recently started having inc allergy symptoms which is typical for Fall. Seh denies any recent fever or chills. She denies dysuria or frequency. She ddenies abdominal pain, nausea or change in bowel movements. Jessica Stevens has attempted regular exercise as she is able , and she states that the phentermine does help alot with apetite suppression.   Current Medications (verified): 1)  Singulair 10 Mg  Tabs (Montelukast Sodium) .... One Tab By Mouth Once Daily 2)  Omeprazole 20 Mg  Cpdr (Omeprazole) .... One Cap By Mouth Two Times A Day 3)  Phentermine Hcl 37.5 Mg Caps (Phentermine Hcl) .... Take 1 Capsule By Mouth Once A Day 4)  Gabapentin 300 Mg Caps (Gabapentin) .Marland Kitchen.. 1 in The Morning and 2 At Bedtime 5)  Restoril 15 Mg Caps (Temazepam) .... Take 1 Capsule By Mouth At Bedtime 6)  Methocarbamol 500 Mg Tabs (Methocarbamol) .... One Tab By Mouth Every 8 Hours Prn 7)  Norco  7.5-325 Mg Tabs (Hydrocodone-Acetaminophen) .... One Tab By Mouth Bid  Allergies (verified): No Known Drug Allergies  Review of Systems      See HPI General:  Complains of fatigue; denies chills and fever. CV:  Denies chest pain or discomfort, palpitations, and swelling of feet. Derm:  Denies itching, lesion(s), and rash. Neuro:  Denies headaches and seizures. Psych:  Denies anxiety and depression. Endo:  Denies cold intolerance, excessive hunger, excessive thirst, excessive urination, heat intolerance, polyuria, and weight change.  Physical Exam  General:  alert, well-hydrated, and overweight-appearing. HEENT: No facial asymmetry,  EOMI, No sinus tenderness, TM's Clear, oropharynx  pink and moist.   Chest: Clear to auscultation bilaterally.  CVS: S1, S2, No murmurs, No S3.   Abd: Soft, Nontender.  Jessica: decreased ROM spine, hips,  and knees.  Ext: No edema.   CNS: CN 2-12 intact, power tone and sensation normal throughout.   Skin: Intact, no visible lesions or rashes.  Psych: Good eye contact, normal affect.  Memory intact, not anxious or depressed appearing.      Impression & Recommendations:  Problem # 1:  INSOMNIA (ICD-780.52) Assessment Improved  The following medications were removed from the medication list:    Restoril 15 Mg Caps (Temazepam) .Marland Kitchen... Take 1 capsule by mouth at bedtime Her updated medication list for this problem includes:  Restoril 30 Mg Caps (Temazepam) .Marland Kitchen... Take 1 capsule by mouth at bedtime  Discussed sleep hygiene.   Problem # 2:  BACK PAIN, LUMBAR, WITH RADICULOPATHY (ICD-724.4) Assessment: Unchanged  The following medications were removed from the medication list:    Norco 5-325 Mg Tabs (Hydrocodone-acetaminophen) ..... One by mouth q 4 hrs as needed pain Her updated medication list for this problem includes:    Methocarbamol 500 Mg Tabs (Methocarbamol) ..... One tab by mouth every 8 hours prn    Ibuprofen 800 Mg Tabs (Ibuprofen) .Marland Kitchen...  Take 1 tablet by mouth two times a day as needed    Tylenol Arthritis Pain 650 Mg Cr-tabs (Acetaminophen) .Marland Kitchen... Take 1 tablet by mouth two times a day  Orders: Ketorolac-Toradol 15mg  (Z6109) Admin of Therapeutic Inj  intramuscular or subcutaneous (60454)  Problem # 3:  HYPERLIPIDEMIA (ICD-272.4) Assessment: Unchanged  The following medications were removed from the medication list:    Lovastatin 40 Mg Tabs (Lovastatin) .Marland Kitchen... Take 2 tabs at bedtime Her updated medication list for this problem includes:    Lovastatin 40 Mg Tabs (Lovastatin) .Marland Kitchen..Marland Kitchen Two tablets at bedtime, impt of compliance stressed, she is currently non compliant reportedly due to finances  Labs Reviewed: SGOT: 17 (11/27/2008)   SGPT: 20 (11/27/2008)   HDL:59 (11/27/2008), 68 (05/18/2008)  LDL:159 (11/27/2008), 161 (05/18/2008)  Chol:238 (11/27/2008), 254 (05/18/2008)  Trig:100 (11/27/2008), 127 (05/18/2008)  Problem # 4:  CARPAL TUNNEL SYNDROME (ICD-354.0) Assessment: Deteriorated refer neurology for injections  Complete Medication List: 1)  Singulair 10 Mg Tabs (Montelukast sodium) .... One tab by mouth once daily 2)  Omeprazole 20 Mg Cpdr (Omeprazole) .... One cap by mouth two times a day 3)  Phentermine Hcl 37.5 Mg Caps (Phentermine hcl) .... Take 1 capsule by mouth once a day 4)  Gabapentin 300 Mg Caps (Gabapentin) .Marland Kitchen.. 1 in the morning and 2 at bedtime 5)  Methocarbamol 500 Mg Tabs (Methocarbamol) .... One tab by mouth every 8 hours prn 6)  Ibuprofen 800 Mg Tabs (Ibuprofen) .... Take 1 tablet by mouth two times a day as needed 7)  Restoril 30 Mg Caps (Temazepam) .... Take 1 capsule by mouth at bedtime 8)  Lovastatin 40 Mg Tabs (Lovastatin) .... Two tablets at bedtime 9)  Tylenol Arthritis Pain 650 Mg Cr-tabs (Acetaminophen) .... Take 1 tablet by mouth two times a day  Other Orders: Influenza Vaccine NON MCR (09811) Neurology Referral (Neuro)  Patient Instructions: 1)  Please schedule a follow-up appointment  in January. 2)  It is important that you exercise regularly at least 20 minutes 5 times a week. If you develop chest pain, have severe difficulty breathing, or feel very tired , stop exercising immediately and seek medical attention. 3)  You need to lose weight. Consider a lower calorie diet and regular exercise.  4)  Meds as discussed. Prescriptions: TYLENOL ARTHRITIS PAIN 650 MG CR-TABS (ACETAMINOPHEN) Take 1 tablet by mouth two times a day  #60 x 4   Entered and Authorized by:   Syliva Overman MD   Signed by:   Syliva Overman MD on 12/15/2008   Method used:   Electronically to        [facility]* (retail)       [address]       [place], [place]  [address]       Ph: [phone]       Fax: [phone]   RxID:   [id number] LOVASTATIN 40  MG TABS (LOVASTATIN) two tablets at bedtime  #60 x 4   Entered and Authorized by:   Syliva Overman MD   Signed by:   Syliva Overman MD on 12/15/2008   Method used:   Electronically to        [facility]* (retail)       [address]       [place], [place]  [address]       Ph: [phone]       Fax: [phone]   RxID:   [id number] RESTORIL 30 MG CAPS (TEMAZEPAM) Take 1 capsule by mouth at bedtime  #30 x 4   Entered and Authorized by:   Syliva Overman MD   Signed by:   Syliva Overman MD on 12/15/2008   Method used:   Printed then faxed to ...       [facility]* (retail)       [address]       [place], [place]  [address]       Ph: [phone]       Fax: [phone]   RxID:   [id number] IBUPROFEN 800 MG TABS (IBUPROFEN) Take 1 tablet by mouth two times a day as needed  #60 x 5   Entered and Authorized by:   Syliva Overman MD   Signed by:   Syliva Overman MD on 12/15/2008   Method used:   Electronically to        [facility] (retail)       [address]       [place], [place]  [address]       Ph: [phone]       Fax: [phone]   RxID:   [id number]    Influenza Vaccine    Vaccine Type: Fluvax Non-MCR    Site: left deltoid    Mfr: novartis    Dose: 0.5 ml    Route: IM    Given by: Worthy Keeler LPN    Exp. Date: 04/11    Lot #: [id number]    VIS given: 09/20/06 version given December 15, 2008.    Medication Administration  Injection # 1:    Medication: Ketorolac-Toradol 15mg     Diagnosis: BACK PAIN, LUMBAR, WITH RADICULOPATHY (ICD-724.4)    Route: IM    Site: RUOQ gluteus    Exp Date: 06/2010    Lot #: [id number]    Mfr: novaplus    Comments: 60 mg given    Patient tolerated injection without complications    Given by: Everitt Amber (December 15, 2008 10:24 AM)  Orders Added: 1)  Influenza Vaccine NON MCR [00028] 2)  Est. Patient Level IV [37106] 3)  Ketorolac-Toradol 15mg  [J1885] 4)  Admin of Therapeutic Inj  intramuscular or subcutaneous [96372] 5)  Neurology Referral [Neuro]

## 2010-03-29 NOTE — Progress Notes (Signed)
Summary: call from patient requests Rx for knee brace  Phone Note Call from Patient   Caller: Patient Summary of Call: Patient called to ask if she can geta prescription for a brace for LT knee - had arthroscopic surgery in June of 2010. Said that she tried to get one at [facility] over the counter and cost was $105.   NEW PH [phone] Initial call taken by: Cammie Sickle,  August 05, 2009 11:43 AM  Follow-up for Phone Call        I called pt and she said that her leg is really big and she needs a custom brace for her, which type do you think this pt needs for her knee. Follow-up by: Ether Griffins,  August 05, 2009 11:49 AM  Additional Follow-up for Phone Call Additional follow up Details #1::        ok  Additional Follow-up by: Fuller Canada MD,  August 05, 2009 11:51 AM    Additional Follow-up for Phone Call Additional follow up Details #2::    ok what kind, hinged knee, Oa brace or? Follow-up by: Ether Griffins,  August 05, 2009 11:54 AM  Additional Follow-up for Phone Call Additional follow up Details #3:: Details for Additional Follow-up Action Taken: ask her what she wants   ok Additional Follow-up by: Ether Griffins,  August 05, 2009 12:07 PM  New/Updated Medications: B & B ELASTIC KNEE BRACE  MISC (ELASTIC BANDAGES & SUPPORTS) needs knee sleeve for OA of the knee  weighs 327 Prescriptions: B & B ELASTIC KNEE BRACE  MISC (ELASTIC BANDAGES & SUPPORTS) needs knee sleeve for OA of the knee  weighs 327  #1 x 0   Entered by:   Ether Griffins   Authorized by:   Fuller Canada MD   Signed by:   Ether Griffins on 08/05/2009   Method used:   Faxed to ...       [facility] (retail)       [address]       [place], [place]  [address]       Ph: [phone]       Fax: [phone]   RxID:   [id number]

## 2010-03-29 NOTE — Letter (Signed)
Summary: MEDICAL RELEASE  MEDICAL RELEASE   Imported By: Lind Guest 12/15/2008 14:24:10  _____________________________________________________________________  External Attachment:    Type:   Image     Comment:   External Document

## 2010-03-29 NOTE — Miscellaneous (Signed)
Summary: Rehab Report  Rehab Report   Imported By: Elvera Maria 07/12/2007 10:44:23  _____________________________________________________________________  External Attachment:    Type:   Image     Comment:   evaluation

## 2010-03-29 NOTE — Progress Notes (Signed)
  Phone Note Other Incoming   Caller: dr simpson Summary of Call: pls send stamped note to the pharmacy d/cing all refills from this office for hydrocodone from this office since she ius getting oit from ortho , and ensure a copy is kept for scanning Initial call taken by: Syliva Overman MD,  August 08, 2009 10:00 PM  Follow-up for Phone Call        faxed and put for scanning Follow-up by: Everitt Amber LPN,  August 09, 2009 9:22 AM

## 2010-03-29 NOTE — Assessment & Plan Note (Signed)
Summary: office visit   Vital Signs:  Patient profile:   45 year old female Menstrual status:  hysterectomy Height:      63.5 inches Weight:      313 pounds BMI:     54.77 O2 Sat:      95 % on Room air Pulse rate:   74 / minute Pulse rhythm:   regular Resp:     16 per minute BP sitting:   128 / 84  (right arm)  Vitals Entered By: Mauricia Area CMA (December 30, 2009 10:38 AM)  Nutrition Counseling: Patient's BMI is greater than 25 and therefore counseled on weight management options.  O2 Flow:  Room air CC: follow up   Primary Care Provider:  Syliva Overman MD  CC:  follow up.  History of Present Illness: Reports  that she has not been doing well. 4 days ago her partener of almost 20 yrs told her to end their relationship and reportedly moved in a new woman.She is distraught, depressed, lacking in self esteem.Denies suicidal or homicidal ideation.  Denies recent fever or chills. Denies sinus pressure, nasal congestion , ear pain or sore throat. Denies chest congestion, or cough productive of sputum. Denies chest pain, palpitations, PND, orthopnea or leg swelling. Denies abdominal pain, nausea, vomitting, diarrhea or constipation. Denies change in bowel movements or bloody stool. Denies dysuria , frequency, incontinence or hesitancy. Chronic uncontrolled back and knee pain. Denies  vertigo, seizures.  Denies  rash, lesions, or itch.     Current Medications (verified): 1)  Singulair 10 Mg  Tabs (Montelukast Sodium) .... One Tab By Mouth Once Daily 2)  Omeprazole 20 Mg  Cpdr (Omeprazole) .... One Cap By Mouth Two Times A Day 3)  Lovastatin 40 Mg Tabs (Lovastatin) .... Two Tablets At Bedtime 4)  Gabapentin 300 Mg Caps (Gabapentin) .... One  in The Morning At 8am, Second At At 2pm , Then Three At Bedtime 5)  Meloxicam 15 Mg Tabs (Meloxicam) .... Take 1 Tablet By Mouth Once A Day 6)  Hydroxyzine Hcl 25 Mg Tabs (Hydroxyzine Hcl) .... Take 1 Tab By Mouth At Bedtime 7)   Duoneb 0.5-2.5 (3) Mg/79ml Soln (Ipratropium-Albuterol) .... Use With [employer] Two Times A Day 8)  Phentermine Hcl 37.5 Mg Tabs (Phentermine Hcl) .... Take 1 Tablet By Mouth Once A Day 9)  Clotrimazole-Betamethasone 1-0.05 % Crea (Clotrimazole-Betamethasone) .... Apply Twice Daily To Affected Areas 10)  Methocarbamol 500 Mg Tabs (Methocarbamol) .... Take 2 Tablets Twice Daily 11)  Norco 5-325 Mg Tabs (Hydrocodone-Acetaminophen) .Marland Kitchen.. 1 By Mouth Q 4 Hrs As Needed Pain 12)  Ofloxacin 0.3 % Soln (Ofloxacin) .... Instill 10 Drops in Right Ear Once A Day X 7 Days  Allergies (verified): No Known Drug Allergies  Review of Systems      See HPI General:  Complains of fatigue, loss of appetite, malaise, and sleep disorder. Eyes:  Denies blurring and discharge. MS:  Complains of joint pain, low back pain, mid back pain, and stiffness. Neuro:  Complains of headaches; poor sleep , excessive crying x 4 days, generalised headache. Psych:  Complains of depression, easily tearful, irritability, and mental problems; denies suicidal thoughts/plans, thoughts of violence, and unusual visions or sounds. Endo:  Denies cold intolerance, excessive hunger, excessive thirst, and heat intolerance. Heme:  Denies abnormal bruising and bleeding. Allergy:  Denies hives or rash and itching eyes.  Physical Exam  General:  Well-developed,morbidly obese in  no acute distress; tearful, flat affect and poor eye  ciontact HEENT: No facial asymmetry,  EOMI, No sinus tenderness, TM's Clear, oropharynx  pink and moist.   Chest: Clear to auscultation bilaterally.  CVS: S1, S2, No murmurs, No S3.   Abd: Soft, Nontender.  MS: decreased  ROM spine, hips, shoulders and knees.  Ext: No edema.   CNS: CN 2-12 intact, power tone and sensation normal throughout.   Skin: Intact, no visible lesions or rashes.  Psych:Depressed appearing   Impression & Recommendations:  Problem # 1:  HEADACHE (ICD-784.0) Assessment Comment  Only  Her updated medication list for this problem includes:    Meloxicam 15 Mg Tabs (Meloxicam) .Marland Kitchen... Take 1 tablet by mouth once a day    Norco 5-325 Mg Tabs (Hydrocodone-acetaminophen) .Marland Kitchen... 1 by mouth q 4 hrs as needed pain  Orders: Ketorolac-Toradol 15mg  (Z6109) Admin of Therapeutic Inj  intramuscular or subcutaneous (60454)  Problem # 2:  OBESITY, UNSPECIFIED (ICD-278.00) Assessment: Improved  Orders: Medicare Electronic Prescription (U9811)  Ht: 63.5 (12/30/2009)   Wt: 313 (12/30/2009)   BMI: 54.77 (12/30/2009)  Problem # 3:  INSOMNIA (ICD-780.52) Assessment: Deteriorated  Her updated medication list for this problem includes:    Temazepam 30 Mg Caps (Temazepam) .Marland Kitchen... Take 1 capsule by mouth at bedtime  Discussed sleep hygiene.   Problem # 4:  DEPRESSION (ICD-311) Assessment: Deteriorated  Orders: Medicare Electronic Prescription [phone])  Her updated medication list for this problem includes:    Hydroxyzine Hcl 25 Mg Tabs (Hydroxyzine hcl) .Marland Kitchen... Take 1 tab by mouth at bedtime    Venlafaxine Hcl 75 Mg Xr24h-cap (Venlafaxine hcl) .Marland Kitchen... Take 1 capsule by mouth once a day  Discussed treatment options, including trial of antidpressant medication. Will refer to behavioral health. Follow-up call in in 24-48 hours and recheck in 2 weeks, sooner as needed. Patient agrees to call if any worsening of symptoms or thoughts of doing harm arise. Verified that the patient has no suicidal ideation at this time.   Problem # 5:  HYPERLIPIDEMIA (ICD-272.4) Assessment: Comment Only  Her updated medication list for this problem includes:    Lovastatin 40 Mg Tabs (Lovastatin) .Marland Kitchen..Marland Kitchen Two tablets at bedtime  Orders: Medicare Electronic Prescription [phone]) T-Hepatic Function [phone]) T-Lipid Profile [phone]) Low fat dietdiscussed and encouraged   Labs Reviewed: SGOT: 18 (08/04/2009)   SGPT: 15 (08/04/2009)   HDL:69 (08/04/2009), 53 (03/16/2009)  LDL:88 (08/04/2009), 164  (03/16/2009)  Chol:171 (08/04/2009), 242 (03/16/2009)  Trig:71 (08/04/2009), 126 (03/16/2009)  Complete Medication List: 1)  Singulair 10 Mg Tabs (Montelukast sodium) .... One tab by mouth once daily 2)  Omeprazole 20 Mg Cpdr (Omeprazole) .... One cap by mouth two times a day 3)  Lovastatin 40 Mg Tabs (Lovastatin) .... Two tablets at bedtime 4)  Gabapentin 300 Mg Caps (Gabapentin) .... One  in the morning at 8am, second at at 2pm , then three at bedtime 5)  Meloxicam 15 Mg Tabs (Meloxicam) .... Take 1 tablet by mouth once a day 6)  Hydroxyzine Hcl 25 Mg Tabs (Hydroxyzine hcl) .... Take 1 tab by mouth at bedtime 7)  Duoneb 0.5-2.5 (3) Mg/2ml Soln (Ipratropium-albuterol) .... Use with neb machine two times a day 8)  Phentermine Hcl 37.5 Mg Tabs (Phentermine hcl) .... Take 1 tablet by mouth once a day 9)  Clotrimazole-betamethasone 1-0.05 % Crea (Clotrimazole-betamethasone) .... Apply twice daily to affected areas 10)  Methocarbamol 500 Mg Tabs (Methocarbamol) .... Take 2 tablets twice daily 11)  Norco 5-325 Mg Tabs (Hydrocodone-acetaminophen) .Marland Kitchen.. 1 by mouth q 4 hrs as needed pain  12)  Ofloxacin 0.3 % Soln (Ofloxacin) .... Instill 10 drops in right ear once a day x 7 days 13)  Venlafaxine Hcl 75 Mg Xr24h-cap (Venlafaxine hcl) .... Take 1 capsule by mouth once a day 14)  Temazepam 30 Mg Caps (Temazepam) .... Take 1 capsule by mouth at bedtime  Other Orders: T-Basic Metabolic Panel [phone]) T-TSH [phone]) T-CBC w/Diff [phone]) T- Hemoglobin A1C ([id number])  Patient Instructions: 1)  CPE in 2 to 2.5 months. 2)  It is important that you exercise regularly at least 20 minutes 5 times a week. If you develop chest pain, have severe difficulty breathing, or feel very tired , stop exercising immediately and seek medical attention. 3)  You need to lose weight. Consider a lower calorie diet and regular exercise.  4)  BMP prior to visit, ICD-9: 5)  Hepatic Panel prior to visit,  ICD-9: 6)  Lipid Panel prior to visit, ICD-9:  fasting ion January. 7)  New meds sent in as discussed for sleep, and depression. 8)  I hope the youstart feeling better soon, pls call if you need more help 9)  You will get toradol for your headache 10)  TSH prior to visit, ICD-9: 11)  CBC w/ Diff prior to visit, ICD-9: 12)  HbgA1C prior to visit, ICD-9: Prescriptions: NORCO 5-325 MG TABS (HYDROCODONE-ACETAMINOPHEN) 1 by mouth q 4 hrs as needed pain  #42 x 1   Entered by:   Mauricia Area CMA   Authorized by:   Syliva Overman MD   Signed by:   Mauricia Area CMA on 12/30/2009   Method used:   Printed then faxed to ...       [facility]* (retail)       [address]       [place], [place]  [address]       Ph: [phone]       Fax: [phone]   RxID:   [id number] HYDROXYZINE HCL 25 MG TABS (HYDROXYZINE HCL) Take 1 tab by mouth at bedtime  #30 x 3   Entered by:   Mauricia Area CMA   Authorized by:   Syliva Overman MD   Signed by:   Mauricia Area CMA on 12/30/2009   Method used:   Electronically to        [facility]* (retail)       [address]       [place], [place]  [address]       Ph: [phone]       Fax: [phone]   RxID:   [id number] MELOXICAM 15 MG TABS (MELOXICAM) Take 1 tablet by mouth once a day  #30 x 3   Entered by:   Mauricia Area CMA   Authorized by:   Syliva Overman MD   Signed by:   Mauricia Area CMA on 12/30/2009   Method used:   Electronically to        [facility]* (retail)       [address]       [place], [place]  [address]       Ph: [phone]       Fax: [phone]   RxID:   [id number] GABAPENTIN 300 MG CAPS (GABAPENTIN) one  in the morning at 8am, second at at 2pm , then three at bedtime  #150 x 3   Entered  by:   Mauricia Area CMA   Authorized by:   Syliva Overman MD   Signed by:   Mauricia Area CMA on 12/30/2009    Method used:   Electronically to        [facility]* (retail)       [address] [place], [place]  [address]       Ph: [phone]       Fax: [phone]   RxID:   [id number] LOVASTATIN 40 MG TABS (LOVASTATIN) two tablets at bedtime  #60 x 3   Entered by:   Mauricia Area CMA   Authorized by:   Syliva Overman MD   Signed by:   Mauricia Area CMA on 12/30/2009   Method used:   Electronically to        [facility] (retail)       [address] [place], [place]  [address]       Ph: [phone]       Fax: [phone]   RxID:   [id number] OMEPRAZOLE 20 MG  CPDR (OMEPRAZOLE) one cap by mouth two times a day  #60 x 3   Entered by:   Mauricia Area CMA   Authorized by:   Syliva Overman MD   Signed by:   Mauricia Area CMA on 12/30/2009   Method used:   Electronically to        [facility] (retail)       [address]       [place], [place]  [address]       Ph: [phone]       Fax: [phone]   RxID:   [id number] SINGULAIR 10 MG  TABS (MONTELUKAST SODIUM) one tab by mouth once daily  #30 Each x 3   Entered by:   Mauricia Area CMA   Authorized by:   Syliva Overman MD   Signed by:   Mauricia Area CMA on 12/30/2009   Method used:   Electronically to        [facility] (retail)       [address]       [place], [place]  [address]       Ph: [phone]       Fax: [phone]   RxID:   [id number] TEMAZEPAM 30 MG CAPS (TEMAZEPAM) Take 1 capsule by mouth at bedtime  #30 x 2   Entered and Authorized by:   Syliva Overman MD   Signed by:   Syliva Overman MD on 12/30/2009   Method used:   Printed then faxed to ...       [facility]* (retail)       [address]       [place], [place]  [address]       Ph: [phone]       Fax: [phone]   RxID:    [id number] VENLAFAXINE HCL 75 MG XR24H-CAP (VENLAFAXINE HCL) Take 1 capsule by mouth once a day  #30 x 2   Entered and Authorized by:   Syliva Overman MD   Signed by:   Syliva Overman MD on 12/30/2009   Method used:   Electronically to  [facility]* (retail)       [address]       [place], [place]  [address]       Ph: [phone]       Fax: [phone]   RxID:   [id number]    Medication Administration  Injection # 1:    Medication: Ketorolac-Toradol 15mg     Diagnosis: HEADACHE (ICD-784.0)    Route: IM    Site: LUOQ gluteus    Exp Date: 12/29/2010    Lot #: [id number]    Mfr: [email or web address]    Comments: 60 mg given    Patient tolerated injection without complications    Given by: Mauricia Area CMA (December 30, 2009 11:42 AM)  Orders Added: 1)  Est. Patient Level IV [01027] 2)  Medicare Electronic Prescription [G8553] 3)  T-Basic Metabolic Panel [[id number]] 4)  T-Hepatic Function [80076-22960] 5)  T-Lipid Profile [80061-22930] 6)  T-TSH [25366-44034] 7)  T-CBC w/Diff [74259-56387] 8)  T- Hemoglobin A1C [83036-23375] 9)  Ketorolac-Toradol 15mg  [J1885] 10)  Admin of Therapeutic Inj  intramuscular or subcutaneous [56433]

## 2010-03-29 NOTE — Assessment & Plan Note (Signed)
Summary: left knee pain salk in past needs [email or web address]   Visit Type:  Follow-up Referring Provider:  Dr Rosine Abe  Primary Provider:  Syliva Overman MD  CC:  left knee pain.  History of Present Illness: I saw Jessica Stevens in the office today for a followup visit.  She is a 45 years old woman with the complaint of:  left knee pain.  Xrays today.  DOS 08-14-08. Left knee arthroscopy.  Medications: Gabapentin 300 mg, Methocarbamol 500 mg, Omeprazole 20 mg, Hydroxyzine HCL 25 mg, Meloxicam 15 mg, Singulair 10 mg, Lovastatin 40 mg, Cod Liver Oil.  The patient has had pain in her knee which had surgery in June of last year since the weather turned cold.  She is currently on hydrocodone 5 mg up to 25 of Tylenol and when that ran out recently she started taking Excedrin continues to complain of medial joint pain.  She denies catching locking or giving way.  She is followed at [facility] for neck and back issues   Allergies: No Known Drug Allergies  Past History:  Past Medical History: Last updated: 10/16/2007 Current Problems:  H N P-CERVICAL (ICD-722.0) RUPTURE ROTATOR CUFF (ICD-727.61) Current Problems:  CERVICAL RADICULOPATHY (ICD-723.4) ANKLE PAIN, LEFT (ICD-719.47) KNEE, ARTHRITIS, DEGEN./OSTEO (ICD-715.96) KNEE PAIN (ICD-719.46) CARPAL TUNNEL SYNDROME (ICD-354.0) URINARY INCONTINENCE (ICD-788.30) GYNECOMASTIA (ICD-611.1) ASTHMA, UNSPECIFIED, UNSPECIFIED STATUS (ICD-493.90) DEPRESSION (ICD-311) OSTEOARTHRITIS, KNEE (ICD-715.96) BACK PAIN, CHRONIC (ICD-724.5) OBESITY, UNSPECIFIED (ICD-278.00) HYPERLIPIDEMIA (ICD-272.4) H N P-CERVICAL (ICD-722.0) RUPTURE ROTATOR CUFF (ICD-727.61)  Past Surgical History: Last updated: 09/09/2008 Partial hysterectomy Tube inserted in right ear (2003) Arthroscopy left knee Romeo Apple)  08/14/2008  Review of Systems Constitutional:  Denies weight loss. Neurologic:  Complains of unsteady gait. Musculoskeletal:  Complains of joint  pain.  Physical Exam  Additional Exam:  Fairly obese black female  Normal pulses and no peripheral edema  Skin LEFT knee normal  Sensation normal in the LEFT knee area skin medial and lateral leg  Psychiatric exam she is awake alert and oriented x3 mood and affect are normal  She is ambulating with a limp using a cane, she has tenderness of the medial compartment.  She has flexion arc of 110.  Her strength and muscle tone are normal  She has a stable knee   Impression & Recommendations:  Problem # 1:  KNEE, ARTHRITIS, DEGEN./OSTEO (ICD-715.96)  3 views of the LEFT knee  Findings: There is definite medial joint space narrowing which I would classify as severe, moderate varus deformity.   Impression: Varus osteoarthritis severe with severe medial joint space narrowing   Inject LEFT knee joint: Verbal consent was obtained. The knee was prepped with alcohol and ethyl chloride. 1 cc of depomedrol 40mg /cc and 4 cc of lidocaine 1% was injected. there were no complications.   It seems to me that the weather seemed to aggravate underlying arthritis.  She is in the process of being evaluated for gastric bypass.  She is only 45 years old she has moderate to severe obesity and I think that weight loss would definitely help her joint pain.  She is not a candidate for knee replacement surgery at this present weight and age.    Her updated medication list for this problem includes:    Meloxicam 15 Mg Tabs (Meloxicam) .Marland Kitchen... Take 1 tablet by mouth once a day    Methocarbamol 500 Mg Tabs (Methocarbamol) .Marland Kitchen... Take 2 tablets every 6 hrs as needed for muscle spasm    Norco 5-325 Mg Tabs (Hydrocodone-acetaminophen) .Marland Kitchen... 1 by mouth  q 4 hrs as needed pain  Orders: Est. Patient Level IV (09811) Knee x-ray,  3 views (91478) Joint Aspirate / Injection, Large (20610) Depo- Medrol 40mg  (J1030)  Patient Instructions: 1)  You have received an injection of cortisone today. You may experience  increased pain at the injection site. Apply ice pack to the area for 20 minutes every 2 hours and take 2 xtra strength tylenol every 8 hours. This increased pain will usually resolve in 24 hours. The injection will take effect in 3-10 days.  2)  Take norco 5 q 4 as needed pain 3)  return as needed 4)    Prescriptions: NORCO 5-325 MG TABS (HYDROCODONE-ACETAMINOPHEN) 1 by mouth q 4 hrs as needed pain  #42 x 1   Entered and Authorized by:   Fuller Canada MD   Signed by:   Fuller Canada MD on 11/29/2009   Method used:   Print then Give to Patient   RxID:   [id number]

## 2010-03-29 NOTE — Assessment & Plan Note (Signed)
Summary: INJ  Nurse Visit              Comments patient given two injections for knee pain     Prior Medications: LIPITOR 40 MG  TABS (ATORVASTATIN CALCIUM) one tab by mouth at bedtime SINGULAIR 10 MG  TABS (MONTELUKAST SODIUM) one tab by mouth once daily OMEPRAZOLE 20 MG  CPDR (OMEPRAZOLE) one cap by mouth two times a day OXYBUTYNIN CHLORIDE 5 MG  TB24 (OXYBUTYNIN CHLORIDE) one tab by mouth once daily NEURONTIN 100 MG  CAPS (GABAPENTIN) 1 at night ROBAXIN 500 MG  TABS (METHOCARBAMOL) 1 by mouth q 8 hrs as needed spasms neck ALLEGRA 180 MG  TABS (FEXOFENADINE HCL) one tab by mouth once daily STERAPRED DS 12 DAY 10 MG  TABS (PREDNISONE) as directed PHENTERMINE HCL 37.5 MG  CAPS (PHENTERMINE HCL) Take 1 tablet by mouth once a day ZOLPIDEM TARTRATE 10 MG TABS (ZOLPIDEM TARTRATE) Take 1 tab by mouth at bedtime LEXAPRO 10 MG TABS (ESCITALOPRAM OXALATE) Take 1 tablet by mouth once a day ROBAXIN 500 MG TABS (METHOCARBAMOL) Take 1 tablet by mouth three times a day DARVOCET-N 100 100-650 MG TABS (PROPOXYPHENE N-APAP) Take 1 tablet by mouth three times a day ADIPEX-P 37.5 MG TABS (PHENTERMINE HCL) Take 1 tablet by mouth once a day Current Allergies: No known allergies     Medication Administration  Injection # 1:    Medication: Depo- Medrol 80mg     Diagnosis: KNEE PAIN (ICD-719.46)    Route: IM    Site: RUOQ gluteus    Exp Date: 10/10    Lot #: [id number]    Mfr: [email or web address]    Patient tolerated injection without complications    Given by: Worthy Keeler LPN (February 03, 2008 2:21 PM)  Injection # 2:    Medication: Ketorolac-Toradol 15mg     Diagnosis: KNEE PAIN (ICD-719.46)    Route: IM    Site: LUOQ gluteus    Exp Date: 04/27/2009    Lot #: [id number]    Mfr: novaplus    Comments: toradol 60mg  given    Patient tolerated injection without complications    Given by: Worthy Keeler LPN (February 03, 2008 2:22 PM)  Orders Added: 1)  Depo- Medrol 80mg  [J1040] 2)  Ketorolac-Toradol  15mg  [J1885] 3)  Admin of Therapeutic Inj  intramuscular or subcutaneous Lepidus.[place]    ]

## 2010-03-29 NOTE — Letter (Signed)
Summary: x rays  x rays   Imported By: Lind Guest 11/17/2009 14:02:43  _____________________________________________________________________  External Attachment:    Type:   Image     Comment:   External Document

## 2010-03-29 NOTE — Miscellaneous (Signed)
Summary: PHYSICAL THERAPY DISCHARGE  PHYSICAL THERAPY DISCHARGE   Imported By: Lind Guest 02/07/2008 08:26:37  _____________________________________________________________________  External Attachment:    Type:   Image     Comment:   External Document

## 2010-03-29 NOTE — Letter (Signed)
Summary: phone notes  phone notes   Imported By: Lind Guest 11/17/2009 14:02:08  _____________________________________________________________________  External Attachment:    Type:   Image     Comment:   External Document

## 2010-03-31 NOTE — Progress Notes (Signed)
Summary: NEED SOME MEDS  Phone Note Call from Patient   Summary of Call: PATIENT CALLED STATES THAT SHE WAS IN THE ER LAST MONDAY FOR HER SHOULDER PATINET WANTED TO KNOW COULD DR SEND SOMETHING IN TO [facility] FOR FOR HER PAIN IN HER SHOULDER Initial call taken by: Eugenio Hoes,  February 22, 2010 11:17 AM  Follow-up for Phone Call        this pt sees ortho about pain , she needs to have orthoo prescribe her pain meds we do not prescribe pain meds from here for her, I think this msg has been relayed before, let her know dr harrison's office is providinhg her pain meds, we refill the meloxicam that's all and she has refills on this Follow-up by: Syliva Overman MD,  February 22, 2010 1:44 PM  Additional Follow-up for Phone Call Additional follow up Details #1::        returned call, no answer Additional Follow-up by: Adella Hare LPN,  February 22, 2010 2:48 PM  New Problems: SHOULDER PAIN (ICD-719.41)   Additional Follow-up for Phone Call Additional follow up Details #2::    patient called in and I advised her that she needed to contact Dr. Romeo Apple and she states she did but they are saying no to pain medication b/c they have never seen her for the shoulder pain. She isn't sure what to do please advise. Follow-up by: Curtis Sites,  February 23, 2010 1:31 PM  Additional Follow-up for Phone Call Additional follow up Details #3:: Details for Additional Follow-up Action Taken: pls explain to Ms Ashlock that she is chronically alreadu on 2 different pain meds if she is having new pain with her shoulder and ortho has not ebvaluated it then she needs appt withh ortho to eval shoulder pain, I will put in a referral for this. Pls also a nurse visit for an anti-inflammatory injection for the pain , today preferred over tomorrow, if she wishe. Pls let nurse know if she is coming,  Additional Follow-up by: Syliva Overman MD,  February 24, 2010 12:17 PM  New Problems: SHOULDER PAIN  [phone])

## 2010-03-31 NOTE — Progress Notes (Signed)
Summary: phone  Phone Note Outgoing Call   Call placed by: shannon Summary of Call: Tried calling patient to advise information from previous phone note.  No answer, I will try calling back shortly. Initial call taken by: Curtis Sites,  February 24, 2010 1:39 PM  Follow-up for Phone Call        patient is coming in this afternoon. Follow-up by: Curtis Sites,  February 24, 2010 3:46 PM

## 2010-03-31 NOTE — Progress Notes (Signed)
Summary: please advise  Phone Note Call from Patient   Summary of Call: patient states she called Dr. Mort Sawyers office to see if she could make an appt with him about her shoulder and lower back, she states they told her she would need to go to [facility], since they had never treated her for this.  Patient states he has treated her before for the shoulder and lower back pain.  Patient states she doesn't have any money to go and see [facility], which she has been seen there before as well.  She states she went to ER on Monday and they told her to take about 4 or 5 advil.   Please advise [phone] Initial call taken by: Curtis Sites,  February 16, 2010 10:44 AM  Follow-up for Phone Call        no suggestions from here, the ortho dr Romeo Apple is recommending [facility].   Follow-up by: Syliva Overman MD,  February 16, 2010 5:23 PM  Additional Follow-up for Phone Call Additional follow up Details #1::        PATIENT AWARE  Additional Follow-up by: Adella Hare LPN,  February 17, 2010 8:55 AM

## 2010-03-31 NOTE — Letter (Signed)
Summary: discharge medication  discharge medication   Imported By: Lind Guest 03/01/2010 14:02:19  _____________________________________________________________________  External Attachment:    Type:   Image     Comment:   External Document

## 2010-03-31 NOTE — Progress Notes (Signed)
  Phone Note Other Incoming   Caller: dr simpson Summary of Call: ins will not cover lansoprazole, prefe pantoprazole will change to this, PLs spk with pt and let her know then fax in script I have entered Initial call taken by: Syliva Overman MD,  February 24, 2010 6:12 PM    New/Updated Medications: PANTOPRAZOLE SODIUM 40 MG TBEC (PANTOPRAZOLE SODIUM) Take 1 tablet by mouth once a day Prescriptions: PANTOPRAZOLE SODIUM 40 MG TBEC (PANTOPRAZOLE SODIUM) Take 1 tablet by mouth once a day  #30 x 3   Entered by:   Adella Hare LPN   Authorized by:   Syliva Overman MD   Signed by:   Adella Hare LPN on 16/11/9602   Method used:   Electronically to        [facility]* (retail)       [address] [place], [place]  [address]       Ph: [phone]       Fax: [phone]   RxID:   [id number] PANTOPRAZOLE SODIUM 40 MG TBEC (PANTOPRAZOLE SODIUM) Take 1 tablet by mouth once a day  #30 x 3   Entered and Authorized by:   Syliva Overman MD   Signed by:   Syliva Overman MD on 02/24/2010   Method used:   Printed then faxed to ...       [facility] (retail)       [address]       [place], [place]  [address]       Ph: [phone]       Fax: [phone]   RxID:   [id number]  called patient, wrong # in chart

## 2010-03-31 NOTE — Letter (Signed)
Summary: new rx  new rx   Imported By: Lind Guest 03/02/2010 08:01:46  _____________________________________________________________________  External Attachment:    Type:   Image     Comment:   External Document

## 2010-03-31 NOTE — Assessment & Plan Note (Signed)
Summary: ANTI INFLAMMATORY INJECTION  Nurse Visit   Vital Signs:  Patient profile:   45 year old female Menstrual status:  hysterectomy Height:      63.5 inches Weight:      311.25 pounds BP sitting:   120 / 70  (left arm)  Vitals Entered By: Adella Hare LPN (February 24, 2010 4:24 PM) Comments right shoulder pain   Allergies: No Known Drug Allergies  Medication Administration  Injection # 1:    Medication: Ketorolac-Toradol 15mg     Diagnosis: SHOULDER PAIN (ICD-719.41)    Route: IM    Site: LUOQ gluteus    Exp Date: 07/29/2011    Lot #: [id number]    Mfr: [email or web address]    Comments: toradol 60mg  given    Patient tolerated injection without complications    Given by: Adella Hare LPN (February 24, 2010 4:25 PM)  Orders Added: 1)  Ketorolac-Toradol 15mg  [J1885] 2)  Admin of Therapeutic Inj  intramuscular or subcutaneous [96372]   Medication Administration  Injection # 1:    Medication: Ketorolac-Toradol 15mg     Diagnosis: SHOULDER PAIN (ICD-719.41)    Route: IM    Site: LUOQ gluteus    Exp Date: 07/29/2011    Lot #: [id number]    Mfr: novaplus    Comments: toradol 60mg  given    Patient tolerated injection without complications    Given by: Adella Hare LPN (February 24, 2010 4:25 PM)  Orders Added: 1)  Ketorolac-Toradol 15mg  [J1885] 2)  Admin of Therapeutic Inj  intramuscular or subcutaneous [96372] Med administered as ordered with no adverse side effects

## 2010-03-31 NOTE — Progress Notes (Signed)
Summary: advised to call [facility] for neck pain  Phone Note Call from Patient Call back at Home Phone [phone]   Caller: Patient Summary of Call: states her neck, shoulder and arm is hurting dwn to her pinky finger, I looked in previous notes and advised her to call [facility] for appt with them, she has been seen by them for her neck before. Initial call taken by: Ether Griffins,  February 24, 2010 2:48 PM

## 2010-04-01 NOTE — Letter (Signed)
Summary: HISTORY AND PHYSICAL  HISTORY AND PHYSICAL   Imported By: Lind Guest 11/17/2009 13:53:23  _____________________________________________________________________  External Attachment:    Type:   Image     Comment:   External Document

## 2010-04-06 NOTE — Progress Notes (Signed)
Summary: appt. with Rehman  Phone Note Outgoing Call   Call placed by: Carollee Herter Summary of Call: Called to inform patient she has an appt. with Dr. Karilyn Cota on Feb. 27,2012 at 2:15.  Initial call taken by: Curtis Sites,  March 28, 2010 9:11 AM

## 2010-04-06 NOTE — Letter (Signed)
Summary: take home instructions from ed  take home instructions from ed   Imported By: Luann Bullins 03/29/2010 13:33:09  _____________________________________________________________________  External Attachment:    Type:   Image     Comment:   External Document

## 2010-04-06 NOTE — Letter (Signed)
Summary: Letter  Letter   Imported By: Lind Guest 03/29/2010 13:33:24  _____________________________________________________________________  External Attachment:    Type:   Image     Comment:   External Document

## 2010-04-06 NOTE — Assessment & Plan Note (Signed)
Summary: phy   Vital Signs:  Patient profile:   45 year old female Menstrual status:  hysterectomy Height:      63.5 inches Weight:      315.75 pounds BMI:     55.25 O2 Sat:      97 % on Room air Pulse rate:   72 / minute Pulse rhythm:   regular Resp:     16 per minute BP sitting:   130 / 88  (left arm)  Vitals Entered By: Adella Hare LPN (March 25, 2010 9:53 AM)  Nutrition Counseling: Patient's BMI is greater than 25 and therefore counseled on weight management options.  O2 Flow:  Room air CC: cpe Is Patient Diabetic? No  Vision Screening:Left eye w/o correction: 20 / 25 Right Eye w/o correction: 20 / 30 Both eyes w/o correction:  20/ 20        Vision Entered By: Adella Hare LPN (March 25, 2010 9:54 AM)   Primary Care Provider:  Syliva Overman MD  CC:  cpe.  History of Present Illness: Reports  that she is fairly well. She is frustrated with herweight gain, and has insufficient funds for weight loss surgery. Denies recent fever or chills. Denies sinus pressure, nasal congestion , ear pain or sore throat. Denies chest congestion, or cough productive of sputum. Denies chest pain, palpitations, PND, orthopnea or leg swelling. Denies abdominal pain, nausea, vomitting, diarrhea or constipation. Denies change in bowel movements or bloody stool. Denies dysuria , frequency, incontinence or hesitancy. Increased back pain with decreased mobility Denies headaches, vertigo, seizures. Does have depression which has worsened as her health is deteriorating Denies  rash, lesions, or itch. Pt was recently at [facility] ED wit c/o uncontrolled neck pain radiaiting to upper extremeyies, she was referred to and given an appt to see the neurologist in [place]     Current Medications (verified): 1)  Singulair 10 Mg  Tabs (Montelukast Sodium) .... One Tab By Mouth Once Daily 2)  Lovastatin 40 Mg Tabs (Lovastatin) .... Two Tablets At Bedtime 3)  Gabapentin 300 Mg Caps  (Gabapentin) .... One  in The Morning At 8am, Second At At 2pm , Then Three At Bedtime 4)  Meloxicam 15 Mg Tabs (Meloxicam) .... Take 1 Tablet By Mouth Once A Day 5)  Hydroxyzine Hcl 25 Mg Tabs (Hydroxyzine Hcl) .... Take 1 Tab By Mouth At Bedtime 6)  Duoneb 0.5-2.5 (3) Mg/28ml Soln (Ipratropium-Albuterol) .... Use With [employer] Two Times A Day 7)  Phentermine Hcl 37.5 Mg Tabs (Phentermine Hcl) .... Take 1 Tablet By Mouth Once A Day 8)  Clotrimazole-Betamethasone 1-0.05 % Crea (Clotrimazole-Betamethasone) .... Apply Twice Daily To Affected Areas 9)  Methocarbamol 500 Mg Tabs (Methocarbamol) .... Take 2 Tablets Twice Daily 10)  Norco 5-325 Mg Tabs (Hydrocodone-Acetaminophen) .Marland Kitchen.. 1 By Mouth Q 4 Hrs As Needed Pain 11)  Venlafaxine Hcl 75 Mg Xr24h-Cap (Venlafaxine Hcl) .... Take 1 Capsule By Mouth Once A Day 12)  Temazepam 30 Mg Caps (Temazepam) .... Take 1 Capsule By Mouth At Bedtime 13)  Omeprazole 40 Mg Cpdr (Omeprazole) .... One Cap By Mouth Two Times A Day  Allergies (verified): No Known Drug Allergies  Review of Systems      See HPI General:  Complains of fatigue and malaise. Eyes:  Complains of vision loss-both eyes; denies discharge, eye pain, and red eye. GI:  Complains of indigestion; denies change in bowel habits, constipation, nausea, and vomiting. MS:  Complains of joint pain, mid back pain, muscle aches,  muscle weakness, and stiffness; increased back and neck pain with upper ext weakness. Neuro:  Complains of weakness. Psych:  Complains of anxiety and depression; denies panic attacks, suicidal thoughts/plans, thoughts of violence, and thoughts /plans of harming others. Endo:  Denies cold intolerance, excessive hunger, excessive thirst, excessive urination, and heat intolerance. Heme:  Denies abnormal bruising and bleeding. Allergy:  Complains of seasonal allergies.  Physical Exam  General:  Well-developed,morbidly obese,in no acute distress; alert,appropriate and cooperative  throughout examination Head:  Normocephalic and atraumatic without obvious abnormalities. No apparent alopecia or balding. Eyes:  No corneal or conjunctival inflammation noted. EOMI. Perrla. Funduscopic exam benign, without hemorrhages, exudates or papilledema. Vision grossly normal. Ears:  External ear exam shows no significant lesions or deformities.  Otoscopic examination reveals clear canals, tympanic membranes are intact bilaterally without bulging, retraction, inflammation or discharge. Hearing is grossly normal bilaterally. Nose:  External nasal examination shows no deformity or inflammation. Nasal mucosa are pink and moist without lesions or exudates. Mouth:  Oral mucosa and oropharynx without lesions or exudates.  Teeth in good repair. Neck:  decreased ROM cervical spine, no adenopathy Chest Wall:  No deformities, masses, or tenderness noted. Breasts:  No mass, nodules, thickening, tenderness, bulging, retraction, inflamation, nipple discharge or skin changes noted.   Lungs:  Normal respiratory effort, chest expands symmetrically. Lungs are clear to auscultation, no crackles or wheezes. Heart:  Normal rate and regular rhythm. S1 and S2 normal without gallop, murmur, click, rub or other extra sounds. Abdomen:  Bowel sounds positive,abdomen soft and mild epigastric tenderness, without masses, organomegaly or hernias noted.obese, normal bowel sounds Rectal:  No external abnormalities noted. Normal sphincter tone. No rectal masses or tenderness.Guaic positive stool Genitalia:  normal introitus, no external lesions, and no vaginal atrophy.  Uterus absent, unable to adequately examine adnexae due to habitus Msk:  No deformity or scoliosis noted of thoracic or lumbar spine.   Pulses:  R and L carotid,radial,femoral,dorsalis pedis and posterior tibial pulses are full and equal bilaterally Extremities:  dcereased ROM spine, hips and knees Neurologic:  No cranial nerve deficits noted. Station and  gait are normal. Plantar reflexes are down-going bilaterally. DTRs are symmetrical throughout. Sensory, motor and coordinative functions appear intact. Skin:  Intact without suspicious lesions or rashes Cervical Nodes:  No lymphadenopathy noted Axillary Nodes:  No palpable lymphadenopathy Inguinal Nodes:  No significant adenopathy Psych:  Cognition and judgment appear intact. Alert and cooperative with normal attention span and concentration. No apparent delusions, illusions, hallucinations   Impression & Recommendations:  Problem # 1:  NECK PAIN, CHRONIC (ICD-723.1) Assessment Deteriorated  Her updated medication list for this problem includes:    Meloxicam 15 Mg Tabs (Meloxicam) .Marland Kitchen... Take 1 tablet by mouth once a day    Methocarbamol 500 Mg Tabs (Methocarbamol) .Marland Kitchen... Take 2 tablets twice daily    Norco 5-325 Mg Tabs (Hydrocodone-acetaminophen) .Marland Kitchen... 1 by mouth q 4 hrs as needed pain  Orders: Neurology Referral (Neuro)  Problem # 2:  GUAIAC POSITIVE STOOL (ICD-578.1) Assessment: Comment Only gI eval  Problem # 3:  BACK PAIN, LUMBAR, WITH RADICULOPATHY (ICD-724.4) Assessment: Deteriorated  Her updated medication list for this problem includes:    Meloxicam 15 Mg Tabs (Meloxicam) .Marland Kitchen... Take 1 tablet by mouth once a day    Methocarbamol 500 Mg Tabs (Methocarbamol) .Marland Kitchen... Take 2 tablets twice daily    Norco 5-325 Mg Tabs (Hydrocodone-acetaminophen) .Marland Kitchen... 1 by mouth q 4 hrs as needed pain  Discussed use of moist heat or ice,  modified activities, medications, and stretching/strengthening exercises. Back care instructions given. To be seen in 2 weeks if no improvement; sooner if worsening of symptoms.   Problem # 4:  HYPERLIPIDEMIA (ICD-272.4) Assessment: Deteriorated  Her updated medication list for this problem includes:    Lovastatin 40 Mg Tabs (Lovastatin) .Marland Kitchen..Marland Kitchen Two tablets at bedtime Low fat dietdiscussed and encouraged  Labs Reviewed: SGOT: 30 (03/23/2010)   SGPT: 39  (03/23/2010)   HDL:73 (03/23/2010), 69 (08/04/2009)  LDL:112 (03/23/2010), 88 (40/11/2723)  Chol:204 (03/23/2010), 171 (08/04/2009)  Trig:93 (03/23/2010), 71 (08/04/2009)  Problem # 5:  OBESITY, UNSPECIFIED (ICD-278.00) Assessment: Deteriorated  Ht: 63.5 (03/25/2010)   Wt: 315.75 (03/25/2010)   BMI: 55.25 (03/25/2010) therapeutic lifestyle change discussed and encouraged  Complete Medication List: 1)  Singulair 10 Mg Tabs (Montelukast sodium) .... One tab by mouth once daily 2)  Lovastatin 40 Mg Tabs (Lovastatin) .... Two tablets at bedtime 3)  Gabapentin 300 Mg Caps (Gabapentin) .... One  in the morning at 8am, second at at 2pm , then three at bedtime 4)  Meloxicam 15 Mg Tabs (Meloxicam) .... Take 1 tablet by mouth once a day 5)  Hydroxyzine Hcl 25 Mg Tabs (Hydroxyzine hcl) .... Take 1 tab by mouth at bedtime 6)  Duoneb 0.5-2.5 (3) Mg/53ml Soln (Ipratropium-albuterol) .... Use with neb machine two times a day 7)  Phentermine Hcl 37.5 Mg Tabs (Phentermine hcl) .... Take 1 tablet by mouth once a day 8)  Clotrimazole-betamethasone 1-0.05 % Crea (Clotrimazole-betamethasone) .... Apply twice daily to affected areas 9)  Methocarbamol 500 Mg Tabs (Methocarbamol) .... Take 2 tablets twice daily 10)  Norco 5-325 Mg Tabs (Hydrocodone-acetaminophen) .Marland Kitchen.. 1 by mouth q 4 hrs as needed pain 11)  Venlafaxine Hcl 75 Mg Xr24h-cap (Venlafaxine hcl) .... Take 1 capsule by mouth once a day 12)  Temazepam 30 Mg Caps (Temazepam) .... Take 1 capsule by mouth at bedtime 13)  Omeprazole 40 Mg Cpdr (Omeprazole) .... One cap by mouth two times a day  Other Orders: Hemoccult Guaiac-1 spec.(in office) (82270) Pap Smear (36644) Gastroenterology Referral (GI)  Patient Instructions: 1)  Please schedule a follow-up appointment in 2 months. 2)  It is important that you exercise regularly at least 20 minutes 5 times a week. If you develop chest pain, have severe difficulty breathing, or feel very tired , stop exercising  immediately and seek medical attention. 3)  You need to lose weight. Consider a lower calorie diet and regular exercise.  4)  Pls cut down on red meat, fried and fatty foods, also butter and cheese 5)  you will be referred  to GI re hidden blood in the stool and heartburn   Orders Added: 1)  Est. Patient 40-64 years [99396] 2)  Hemoccult Guaiac-1 spec.(in office) [82270] 3)  Pap Smear [[id number]] 4)  Gastroenterology Referral [GI] 5)  Neurology Referral [Neuro]     Laboratory Results  Date/Time Received: March 25, 2010 10:34 AM  Date/Time Reported: March 25, 2010 10:34 AM   Stool - Occult Blood Hemmoccult #1: positive Date: 03/25/2010 Comments: 50201 10L 02/13 118 10/12 Adella Hare LPN  March 25, 2010 10:35 AM

## 2010-04-25 ENCOUNTER — Ambulatory Visit (INDEPENDENT_AMBULATORY_CARE_PROVIDER_SITE_OTHER): Payer: Self-pay | Admitting: Internal Medicine

## 2010-05-12 ENCOUNTER — Encounter: Payer: Self-pay | Admitting: Family Medicine

## 2010-05-17 NOTE — Letter (Signed)
[facility]   Imported By: Lind Guest 05/12/2010 10:30:42  _____________________________________________________________________  External Attachment:    Type:   Image     Comment:   External Document

## 2010-05-20 ENCOUNTER — Encounter: Payer: Self-pay | Admitting: Family Medicine

## 2010-05-23 ENCOUNTER — Encounter: Payer: Self-pay | Admitting: Family Medicine

## 2010-05-24 ENCOUNTER — Ambulatory Visit (INDEPENDENT_AMBULATORY_CARE_PROVIDER_SITE_OTHER): Payer: MEDICARE | Admitting: Family Medicine

## 2010-05-24 ENCOUNTER — Encounter: Payer: Self-pay | Admitting: Family Medicine

## 2010-05-24 VITALS — BP 120/70 | HR 79 | Resp 16 | Ht 64.25 in | Wt 314.0 lb

## 2010-05-24 DIAGNOSIS — E785 Hyperlipidemia, unspecified: Secondary | ICD-10-CM

## 2010-05-24 DIAGNOSIS — F32A Depression, unspecified: Secondary | ICD-10-CM

## 2010-05-24 DIAGNOSIS — F329 Major depressive disorder, single episode, unspecified: Secondary | ICD-10-CM

## 2010-05-24 DIAGNOSIS — M5090 Cervical disc disorder, unspecified, unspecified cervical region: Secondary | ICD-10-CM

## 2010-05-24 MED ORDER — METHYLPREDNISOLONE ACETATE 80 MG/ML IJ SUSP
80.0000 mg | Freq: Once | INTRAMUSCULAR | Status: AC
  Administered 2010-05-24: 80 mg via INTRAMUSCULAR

## 2010-05-24 MED ORDER — KETOROLAC TROMETHAMINE 30 MG/ML IM SOLN
30.0000 mg | INTRAMUSCULAR | Status: AC
  Administered 2010-05-24: 30 mg via INTRAMUSCULAR

## 2010-05-24 MED ORDER — PREDNISONE (PAK) 5 MG PO TABS: 5.0000 mg | ORAL_TABLET | ORAL | Status: AC

## 2010-05-24 MED ORDER — HYDROCODONE-ACETAMINOPHEN 7.5-500 MG PO TABS: 1.0000 | ORAL_TABLET | Freq: Two times a day (BID) | ORAL | Status: DC | PRN

## 2010-05-24 NOTE — Progress Notes (Signed)
Subjective:    Patient ID: Jessica Stevens, female    DOB: [birth year], 45 y.o.   MRN: [id number]  Hypertension This is a chronic problem. The current episode started more than 1 year ago. The problem is unchanged. The problem is controlled. Associated symptoms include neck pain. Pertinent negatives include no chest pain, headaches, palpitations or shortness of breath. Risk factors for coronary artery disease include dyslipidemia, obesity and sedentary lifestyle. The current treatment provides moderate improvement. There are no compliance problems.   Neck Pain  This is a chronic problem. The current episode started more than 1 year ago. The problem occurs daily. The problem has been gradually worsening. The pain is associated with an unknown factor. The quality of the pain is described as shooting, burning and aching. The pain is at a severity of 8/10. The pain is severe. The symptoms are aggravated by nothing. The pain is same all the time. Associated symptoms include numbness, tingling and weakness. Pertinent negatives include no chest pain, fever, headaches, photophobia or trouble swallowing. She has tried NSAIDs, oral narcotics and acetaminophen for the symptoms. The treatment provided no relief.    Painful left 2nd toe due to ingrown toenail for months, she has been cutting it down  Very low, but in the past 2 weeks, she has had increased and uncontrolled symptoms, wants podiatry to look at this. reports a level 10 pain all the time, never stops , reports muscle spasms and chronic headache., poor grip in right hand.she states ortho is no longer prescribing hydrocodone as she has not been in recently for a knee pain, and this has not been a recent problem , and she states the neurologist had her do an MRI of the neck and told her she may need surgery and she recently had an mRI of the neck.He is not prescribing meds for her.  Review of Systems  Constitutional: Negative for fever, chills,  activity change, appetite change, fatigue and unexpected weight change.  HENT: Positive for rhinorrhea, sneezing, neck pain and postnasal drip. Negative for hearing loss, ear pain, congestion, sore throat, trouble swallowing, neck stiffness and sinus pressure.   Eyes: Negative for photophobia, pain, discharge, redness, itching and visual disturbance.  Respiratory: Negative for cough, chest tightness, shortness of breath and wheezing.   Cardiovascular: Negative for chest pain, palpitations and leg swelling.  Gastrointestinal: Negative for nausea, vomiting, abdominal pain, diarrhea, constipation and blood in stool.  Genitourinary: Negative for dysuria, frequency, hematuria and flank pain.  Musculoskeletal: Positive for back pain. Negative for myalgias, joint swelling, arthralgias and gait problem.  Skin: Negative for rash and wound.  Neurological: Positive for tingling, weakness and numbness. Negative for dizziness, tremors, seizures, speech difficulty and headaches.  Hematological: Negative for adenopathy. Does not bruise/bleed easily.  Psychiatric/Behavioral: Positive for sleep disturbance. Negative for suicidal ideas, hallucinations, behavioral problems, confusion and decreased concentration. The patient is not nervous/anxious and is not hyperactive.        [Depression stil lsevere, not suicidal or homicidal, has not started therapy but wants to, excessive crying      Objective:   Physical Exam  [nursing notereviewed. Constitutional: She is oriented to person, place, and time. She appears well-nourished. No distress.       Morbidly obese  HENT:  Head: Normocephalic.  Right Ear: External ear normal.  Left Ear: External ear normal.  Mouth/Throat: No oropharyngeal exudate.  Eyes: Conjunctivae and EOM are normal. Right eye exhibits no discharge. Left eye exhibits no discharge. No scleral icterus.  Neck: No JVD present. No tracheal deviation present. No thyromegaly present.       Decreased  ROM neck  Cardiovascular: Normal rate, regular rhythm, normal heart sounds and intact distal pulses.   No murmur heard. Pulmonary/Chest: Effort normal and breath sounds normal. No stridor. No respiratory distress. She has no wheezes. She has no rales. She exhibits no tenderness.  Abdominal: Soft. Bowel sounds are normal. There is no tenderness. There is no rebound and no guarding.  Musculoskeletal: She exhibits no edema.       Decreased rOM spine, hips, shoulder and knees with marked joint deformity.  Lymphadenopathy:    She has no cervical adenopathy.  Neurological: She is alert and oriented to person, place, and time. No cranial nerve deficit. Coordination normal.  Skin: Skin is warm and dry. No rash noted. No erythema.  Psychiatric: She has a normal mood and affect. Her behavior is normal. Judgment and thought content normal.       Poor eye contact at times , with tearfullness          Assessment & Plan:  1.Neck pian: deteriorated, recent mRI shows disc disease with nerve compression likely, will prescribed limited narcotic meds in light of uncontrolled pain and no other current prescribers. 2.Hypertension: no change in meds, adequate control. 3.Depression: unchanged, pt will schedule with therapy. 4.Morbid obesity unchanged, has attempted in the past to have surgical intervention, which she needs. 5.Hyperlipidemia:Hyperlipidemia:Low fat diet discussed and encouraged.

## 2010-05-24 NOTE — Patient Instructions (Signed)
F/u in 4 months.  You will get injections for neck pain and will start new pain med also.  You are being referred to therapy for depression and also to podiatry

## 2010-05-25 ENCOUNTER — Ambulatory Visit (INDEPENDENT_AMBULATORY_CARE_PROVIDER_SITE_OTHER): Payer: Self-pay | Admitting: Internal Medicine

## 2010-05-26 ENCOUNTER — Encounter: Payer: Self-pay | Admitting: Family Medicine

## 2010-05-29 ENCOUNTER — Encounter: Payer: Self-pay | Admitting: Family Medicine

## 2010-06-06 LAB — BASIC METABOLIC PANEL
BUN: 13 mg/dL (ref 6–23)
CO2: 28 mEq/L (ref 19–32)
Calcium: 9.7 mg/dL (ref 8.4–10.5)
Chloride: 107 mEq/L (ref 96–112)
Creatinine, Ser: 0.74 mg/dL (ref 0.4–1.2)
GFR calc Af Amer: >60 mL/min (ref >60)
GFR calc non Af Amer: >60 mL/min (ref >60)
Glucose, Bld: 105 mg/dL — ABNORMAL HIGH (ref 70–99)
Potassium: 4.1 mEq/L (ref 3.5–5.1)
Sodium: 142 mEq/L (ref 135–145)

## 2010-06-06 LAB — HEMOGLOBIN AND HEMATOCRIT, BLOOD
HCT: 33.2 % — ABNORMAL LOW (ref 36.0–46.0)
Hemoglobin: 11.2 g/dL — ABNORMAL LOW (ref 12.0–15.0)

## 2010-06-19 ENCOUNTER — Other Ambulatory Visit: Payer: Self-pay | Admitting: Family Medicine

## 2010-06-28 ENCOUNTER — Other Ambulatory Visit: Payer: Self-pay | Admitting: Family Medicine

## 2010-06-28 DIAGNOSIS — Z139 Encounter for screening, unspecified: Secondary | ICD-10-CM

## 2010-06-29 ENCOUNTER — Telehealth: Payer: Self-pay | Admitting: Family Medicine

## 2010-06-29 MED ORDER — METHOCARBAMOL 500 MG PO TABS: 500.0000 mg | ORAL_TABLET | Freq: Four times a day (QID) | ORAL | Status: DC | PRN

## 2010-06-29 NOTE — Telephone Encounter (Signed)
Med resent as requested 

## 2010-07-12 NOTE — Procedures (Signed)
NAME:  Jessica Stevens, Jessica Stevens            ACCOUNT NO.:  [id number]   MEDICAL RECORD NO.:  [id number]           PATIENT TYPE:   LOCATION:                                 FACILITY:   PHYSICIAN:  Erick Colace, M.D.DATE OF BIRTH:  1966-02-08   DATE OF PROCEDURE:  DATE OF DISCHARGE:                               OPERATIVE REPORT   This is a left S1 transforaminal lumbar epidural steroid injection under  fluoroscopic guidance.   INDICATIONS:  Left S1 radiculitis as demonstrated on MRI demonstrating  L5-S1 disk causing left S1 nerve root compression.  Her pain is only  partially responsive to medication management and increased inhibits her  daily activities and mobility.   Informed consent was obtained after describing risks and benefits of the  procedure with the patient.  These include bleeding, bruising,  infection.  She elects to proceed and has given written consent.  She is  not on any blood thinners.  Informed consent was obtained after  describing risks and benefits of the procedure with the patient.  These  include bleeding, bruising, infection.  She elects to proceed and has  given written consent.  The patient placed prone on fluoroscopy table.  Betadine prep, sterile drape.  A 25-gauge inch and a half needle was  used to anesthetize the skin and subcutaneous tissue, 1% lidocaine x2  mL.  Then, a 22-gauge 5-inch spinal needle was inserted under  fluoroscopic guidance with AP, lateral, and oblique imaging.  Omnipaque  180 into the left S1 foramen.  AP, lateral, and oblique imaging  utilized.  Omnipaque 180 x0.5 mL demonstrate good nerve root spread  followed by injection of 1 mL of 10 mg/mL dexamethasone and 2 mL of 1%  MPF lidocaine.  The patient tolerated the procedure well.  Preinjection  pain level down the left lower extremity to the foot was 8/10,  postinjection was 0/10.  I will see her back in 1 month, possible  reinjection if her pain recurs.      Erick Colace, M.D.  Electronically Signed     AEK/MEDQ  D:  06/08/2008 14:27:40  T:  06/09/2008 04:40:09  Job:  [id number]

## 2010-07-12 NOTE — Procedures (Signed)
NAME:  Jessica Stevens, Jessica Stevens            ACCOUNT NO.:  [id number]   MEDICAL RECORD NO.:  [id number]          PATIENT TYPE:  REC   LOCATION:  [facility]                          FACILITY:  [facility]   PHYSICIAN:  Erick Colace, M.D.DATE OF BIRTH:  10-30-1965   DATE OF PROCEDURE:  07/06/2008  DATE OF DISCHARGE:                               OPERATIVE REPORT   This is a left carpal tunnel injection.   INDICATIONS:  Left carpal tunnel syndrome.   Informed consent was obtained after describing risks and benefits of the  procedure with the patient.  These include bleeding, bruising,  infection.  She elects to proceed and has given written consent.  The  patient in seated position, the area marked and prepped with Betadine  and alcohol.  Distal wrist crease between the palmaris longus and flexor  carpi radialis.  Skin wheal raised with 1% lidocaine x0.5 mL followed by  injection using a 27-gauge 5-1/2 inch needle inserted into the carpal  tunnel 0.25 mL of 10 mg/mL Depo-Medrol injected after negative drawback  for blood.  The patient tolerated the procedure well.  Post injection  instructions given.      Erick Colace, M.D.  Electronically Signed     AEK/MEDQ  D:  07/06/2008 13:46:59  T:  07/07/2008 05:27:14  Job:  [id number]   cc:   Milus Mallick. Lodema Hong, M.D.  Fax: [phone]

## 2010-07-12 NOTE — Op Note (Signed)
NAMEBRITTAINY, Jessica Stevens            ACCOUNT NO.:  [id number]   MEDICAL RECORD NO.:  [id number]          PATIENT TYPE:  AMB   LOCATION:  DAY                           FACILITY:  [facility]   PHYSICIAN:  Vickki Hearing, M.D.DATE OF BIRTH:  02-08-1966   DATE OF PROCEDURE:  DATE OF DISCHARGE:                               OPERATIVE REPORT   ADDENDUM   MEDICATIONS ON DISCHARGE:  1. Phenergan 25 mg p.o. q.4 p.r.n. #40.  2. Norco 7.5/325 mg p.o. q.4 p.r.n. pain #90, no refills.  3. Coumadin 2.5 mg p.o. daily x10 days.   She is already scheduled for her followup appointment, which will be on  August 18, 2008.      Vickki Hearing, M.D.  Electronically Signed     SEH/MEDQ  D:  08/14/2008  T:  08/14/2008  Job:  [id number]

## 2010-07-12 NOTE — Procedures (Signed)
NAME:  Jessica Stevens, Jessica Stevens            ACCOUNT NO.:  [id number]   MEDICAL RECORD NO.:  [id number]           PATIENT TYPE:   LOCATION:                                 FACILITY:   PHYSICIAN:  Erick Colace, M.D.DATE OF BIRTH:  04-03-1965   DATE OF PROCEDURE:  DATE OF DISCHARGE:                               OPERATIVE REPORT   This is a right S1 transforaminal lumbar epidural steroid injection  under fluoroscopic guidance.   INDICATIONS:  Lumbosacral disk L5-S1 causing right lower extremity pain,  radiculitis.   Informed consent was obtained after describing risks and benefits of the  procedure with the patient.  These include bleeding, bruising,  infection.  She elects to proceed and has given written consent.  Pain  is only partially responsive to medication management and interferes  with mobility, particularly walking.  The patient placed prone on  fluoroscopy table.  Betadine prep, sterile drape.  A 25-gauge inch and a  half needle was used to anesthetize the skin and subcutaneous tissue  with 1% lidocaine x2 mL.  Then, a 22-gauge 5-inch spinal needle was  inserted under fluoroscopic guidance in right S1 foramen, AP and lateral  images.  Omnipaque 180 under live fluoro demonstrated no intravascular  uptake and good epidural spread.  A 1 mL of 10 mg/mL dexamethasone and 2  mL of 1% lidocaine MPF injected.  The patient tolerated the procedure  well.  Pre and postinjection vitals stable.  Postinjection instructions  given.  Preinjection pain level 7/10.  Post injection pain level 2-3/10.      Erick Colace, M.D.  Electronically Signed     AEK/MEDQ  D:  10/26/2008 09:44:14  T:  10/27/2008 16:10:96  Job:  [id number]   cc:   Stefani Dama, M.D.  Fax: [phone]

## 2010-07-12 NOTE — Assessment & Plan Note (Signed)
This is a 45 year old female with a herniated nucleus pulposus at L5-S1  with right S1 distribution radicular pain.  She was seen by me initially  in January 2010 and has undergone physical therapy in the past.  In  addition, she has left knee osteoarthritis, recently underwent  arthroscopic surgery by Dr. Fuller Canada for that.  She had a  partial medial meniscectomy and chondroplasty.  On August 14, 2008, she  had some postoperative medications Dr. Romeo Apple has agreed upon and is  back today.  She was going to get a carpal tunnel injection, but the  first one done a couple months ago was not really that helpful.  Her S1  transforaminal injections were temporarily helpful, last one performed  on August 03, 2008, previous one June 08, 2008.   She continues to have pain going down her left leg.  She has some in the  left hip as well.  She wondered whether her hip pain came from her back.  She was seen by her primary physician who also sent for interlaminar  epidural at L4-5 on the right which was not helpful.  This was done just  about a week after I did my injection.  She has not had any hip x-rays.   PHYSICAL EXAMINATION:  GENERAL:  No acute distress.  Mood and affect  appropriate.  She is morbidly obese.   His blood pressure 138/83, pulse 69, respiratory rate is 18, O2 sat 99%  on room air.   She has decreased sensation in right S1 dermatome.  She has decreased  right S1 reflex.  She has good strength in lower extremities.  Her hip  has decreased range of motion, internal and external rotation.  Her  hands have decreased sensation in the pinky, negative reverse Phalen's,  normal sensation in the median nerve distribution.   IMPRESSION:  1. Right S1 radiculopathy, not responding to conservative care.  I      think it would be appropriate to send her to Neurosurgery for      evaluation at this point given that epidural steroid injections x3      have not been helpful at least in the  long-term basis.  2. Hand numbness, initially presented more as a carpal tunnel and had      EMG done by outlying area neurologist.  Her exam today more      consistent for ulnar neuropathy.  We will repeat EMG and CV in a      month.  3. For her pain, we will restart her on her hydrocodone 7.5/325 b.i.d.       Erick Colace, M.D.  Electronically Signed     AEK/MedQ  D:  09/14/2008 10:45:22  T:  09/14/2008 23:53:17  Job #:  [id number]   cc:   Hewitt Shorts, M.D.  Fax: [phone]   Coletta Memos, M.D.  Fax: [phone]   Milus Mallick. Lodema Hong, M.D.  Fax: [phone]
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
## 2010-07-12 NOTE — Assessment & Plan Note (Signed)
A 45 year old female who has a history of back pain and left lower  extremity pain.  She has undergone an epidural steroid injection on  June 08, 2008, which resulted in improvement in left lower extremity  pain.  This is left S1 transforaminal.  She was scheduled for another S1  transforaminal injection today but does not have a driver.  Her average  pain has climbed back to 8/10 range.  She had about a 3-4 week  improvement in her pain symptoms.  She still has constant aching-type  pain in the left lower extremity.  Pain is worse with walking, standing,  and sleep.  She also complains of bilateral upper extremity pain.  She  has numbness that wakes her up at night.  She has been diagnosed with  carpal tunnel.  A neurologist did an EMG and tried some acupuncture and  splints, has not tried carpal tunnel injections.  She has numbness in  the hand but not in the feet.  Her other review of system is positive  for depression as well as night sweats.   SOCIAL HISTORY:  Single, lives with her son.   PHYSICAL EXAMINATION:  Blood pressure 153/96, pulse 59, respirations 20,  O2 sat 99% on room air.  Obese female in no acute stress.  Orientation  x3.  Affect is alert.  Gait is with a limp.  She has no evidence of toe  drag or knee instability.  She has some tenderness in the lumbosacral  junction.  Lumbar spine range of motion is limited to 50% forward  flexion and extension.  Lower extremity strength is normal.  Upper  extremity strength is normal.  Negative Tinel's, negative Phalen's.  No  intrinsic atrophy.  She currently has no sensory deficits in the upper  or lower extremities.   IMPRESSION:  1. Left S1 radiculitis.  She has had improvements after epidural      injection, but it has worn off.  She does have evidence of L5-S1      disk causing left S1 spinal root compression.  2. History of carpal tunnel syndrome, still has nighttime dysesthesias      that wake her up frequently.  She  would like to do something more      about it.  I have discussed left carpal injection with her which is      her more symptomatic side and she elects to proceed with that      today.   Discussed treatment plan, she is agreement.  She already received some  hydrocodone which she is to stay on a b.i.d. basis.  She received a  prescription on Jul 02, 2008.  I will schedule her for another epidural  as well.       Erick Colace, M.D.  Electronically Signed     AEK/MedQ  D:  07/06/2008 13:45:53  T:  07/07/2008 05:11:57  Job #:  [id number]   cc:   Milus Mallick. Lodema Hong, M.D.  Fax: [phone]

## 2010-07-12 NOTE — Op Note (Signed)
NAME:  GRACIOUS, RENKEN            ACCOUNT NO.:  [id number]   MEDICAL RECORD NO.:  [id number]          PATIENT TYPE:  AMB   LOCATION:  DAY                           FACILITY:  [facility]   PHYSICIAN:  Vickki Hearing, M.D.DATE OF BIRTH:  05/15/1965   DATE OF PROCEDURE:  08/14/2008  DATE OF DISCHARGE:                               OPERATIVE REPORT   HISTORY:  A 45 year old female, [facility] to me by [facility] for [facility].  She was treated with physical  therapy program and Medrol Dosepak, advised to lose weight, and follow  up as needed.  She had an injection from the referring physician that  helped for a week.  She also took Norco 5 mg for pain.  She had an x-  ray, which showed arthritis of the knee.  She has chronic pain and  lumbar disk disease.  She had an MRI at [facility] on April 22, which  showed a torn medial meniscus and some osteoarthritis of the knee.   Because she had not improved, we offered her arthroscopic surgery.  She  understands that some of her pain is coming from her disk disease and  some of it is coming from arthritis and torn meniscus.   PREOPERATIVE DIAGNOSIS:  Torn medial meniscus and osteoarthritis, left  knee.   POSTOPERATIVE DIAGNOSIS:  Torn medial meniscus and osteoarthritis, left  knee.   PROCEDURE:  Arthroscopy, left knee; partial medial meniscectomy,  chondroplasty of medial femoral condyle.   SURGEON:  Vickki Hearing, MD   ASSISTANT:  None.   ANESTHETIC:  General.   OPERATIVE FINDINGS:  Osteoarthritis, grade 3 medial femoral condyle,  moderate synovitis, and torn medial meniscus, left knee.  Other findings  included normal ACL, PCL, fairly normal lateral meniscus and lateral  femoral condyle, and fairly normal patellofemoral joint.   DETAILS OF PROCEDURE:  This patient was identified as Burlene Arnt,  and the site marking and countersigning was performed in the preop area.  The history and  physical were updated, and the patient was taken to  surgery.  A gram of Ancef was given IV.  She was given a general  anesthetic, and her left leg was then prepped with chlorhexidine and  then draped sterilely after it was placed in a leg holder.   Time-out procedure was then initiated, completed, and everyone agreed on  our plan.   This was a difficult scope in terms of getting entry into the joint  because of the patient's obesity.   Spinal needle was used to confirm the lateral portal, followed by 11  blade to enter the joint.  The scope was then introduced into the joint,  into the medial compartment, and then diagnostic arthroscopy was  performed.  Findings were as noted.   We established a medial portal also using a spinal needle.  We used  several instruments and biters to resect the torn medial meniscus.  We  balanced the meniscus with a shaver and removed meniscal fragments with  a shaver as well.  We used a 50-degree ArthroCare Wand to contour  the  meniscus and then we used a Paragon device to perform the chondroplasty  on the medial femoral condyle.  We washed the knee out and closed it  with 3-0 nylon sutures, and we injected 30 mL total of Marcaine with  epinephrine 15 at the beginning of the case and 15 at the end of the  case.  We applied the sterile dressings, a Cryo/Cuff, and the Ace  bandage, and we took her to the recovery room in stable condition.   She will receive a dose of Lovenox and go on Coumadin for a period of 10  days because of her obesity and risk of DVT.      Vickki Hearing, M.D.  Electronically Signed     SEH/MEDQ  D:  08/14/2008  T:  08/14/2008  Job:  [id number]

## 2010-07-12 NOTE — Procedures (Signed)
NAME:  Jessica Stevens, Jessica Stevens            ACCOUNT NO.:  [id number]   MEDICAL RECORD NO.:  [id number]           PATIENT TYPE:   LOCATION:                                 FACILITY:   PHYSICIAN:  Erick Colace, M.D.DATE OF BIRTH:  31-Aug-1965   DATE OF PROCEDURE:  DATE OF DISCHARGE:                               OPERATIVE REPORT   This is a left S1 transforaminal lumbar epidural steroid injection.  She  has had good results after the last injection done approximately 2  months ago but has been wearing off, has an evidence of L5-S1 disk  causing left S1 spinal nerve root compression.  Informed consent was  obtained after describing risks and benefits of the procedure with the  patient.  These include bleeding, bruising, infection.  She elects to  proceed and has given written consent.   The patient placed prone on fluoroscopy table.  Betadine prep, sterile  drape.  A 25-gauge inch and a half needle was used to anesthetize the  skin and subcu tissue, 1% lidocaine x2 mL.  Then, a 22-gauge 5-inch  spinal needle was inserted in the left S1 foramen.  AP images obtained  bilateral because of her girth difficult to obtain.  Omnipaque 180  showed no intravascular uptake followed by injection of 1 mL of 10 mg/mL  dexamethasone and 2 mL of 1% MPF lidocaine.  The patient tolerated the  procedure well.  Pre and postinjection vitals stable.  Postinjection  instructions.  We will write another prescription for hydrocodone and  see her back in about 8 weeks.      Erick Colace, M.D.  Electronically Signed     AEK/MEDQ  D:  08/03/2008 12:23:15  T:  08/04/2008 04:16:30  Job:  [id number]
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
## 2010-07-12 NOTE — Procedures (Signed)
NAMESHULAMIS, Jessica Stevens            ACCOUNT NO.:  [id number]   MEDICAL RECORD NO.:  [id number]           PATIENT TYPE:   LOCATION:                                 FACILITY:   PHYSICIAN:  Erick Colace, M.D.DATE OF BIRTH:  1965-09-07   DATE OF PROCEDURE:  DATE OF DISCHARGE:                               OPERATIVE REPORT   Left knee injection.   INDICATION:  Left knee osteoarthritis with pain only partially  responsive to medication management interfere with mobility.   Informed consent was obtained after describing risks and benefits of the  procedure with the patient.  These include bleeding, bruising, and  infection.  She elects to proceed and has given written consent.  The  patient placed supine on exam table with knee slightly bent.  Medial  approach utilized.  The area was marked and prepped with Betadine and  entered with 21-gauge needle and 2 inches length inserted.  Negative  drawback for blood.  Prior to injection, injection consisting 1 mL of 40  mg/mL Depo-Medrol and 4 mL of 1% lidocaine.  The patient tolerated the  procedure well.  Pre- and post-injection vitals stable.  Pre- and post-  injection instructions was given.      Erick Colace, M.D.  Electronically Signed     AEK/MEDQ  D:  04/07/2008 10:11:05  T:  04/07/2008 22:04:59  Job:  [id number]

## 2010-07-15 NOTE — Procedures (Signed)
NAMEWINIFRED, BODIFORD            ACCOUNT NO.:  [id number]   MEDICAL RECORD NO.:  [id number]          PATIENT TYPE:  OUT   LOCATION:  SLEEP LAB                     FACILITY:  [facility]   PHYSICIAN:  Marcelyn Bruins, M.D. [facility] DATE OF BIRTH:  23-Dec-1965   DATE OF STUDY:  03/27/2004                              NOCTURNAL POLYSOMNOGRAM   DATE OF STUDY:  March 27, 2004   REFERRING PHYSICIAN:  Dr. Syliva Overman   INDICATION FOR THE STUDY:  Hypersomnia with sleep apnea.   SLEEP ARCHITECTURE:  The patient had a total sleep time of only 153 minutes  with a very prolonged sleep onset latency of 204 minutes. The patient had  very little REM and slow wave sleep. REM onset latency was mildly prolonged.   IMPRESSION:  1.  Moderate obstructive sleep apnea/hypopnea syndrome with a respiratory      disturbance index of 21 events per hour and O2 desaturation as low as      85%. Events were not positional.  2.  Moderate to loud snoring noted throughout the study.  3.  No clinically significant cardiac arrhythmias.  4.  Very prolonged sleep onset latency. It is unclear whether this is an      issue for the patient at home on a regular basis or not. Clinical      correlation is suggested.      KC/MEDQ  D:  04/05/2004 19:40:37  T:  04/05/2004 21:10:36  Job:  [id number]

## 2010-07-20 ENCOUNTER — Telehealth: Payer: Self-pay | Admitting: Family Medicine

## 2010-07-21 ENCOUNTER — Other Ambulatory Visit: Payer: Self-pay | Admitting: Family Medicine

## 2010-07-21 DIAGNOSIS — R252 Cramp and spasm: Secondary | ICD-10-CM

## 2010-07-21 MED ORDER — CYCLOBENZAPRINE HCL 10 MG PO TABS: 10.0000 mg | ORAL_TABLET | Freq: Three times a day (TID) | ORAL | Status: DC | PRN

## 2010-07-21 NOTE — Telephone Encounter (Signed)
pls let her know med sent in

## 2010-07-21 NOTE — Telephone Encounter (Signed)
Patient aware.

## 2010-07-26 ENCOUNTER — Other Ambulatory Visit: Payer: Self-pay | Admitting: Family Medicine

## 2010-08-04 ENCOUNTER — Ambulatory Visit (HOSPITAL_COMMUNITY)
Admission: RE | Admit: 2010-08-04 | Discharge: 2010-08-04 | Disposition: A | Discharge status: Home / Self Care | Payer: Medicare Other | Source: Ambulatory Visit | Attending: Family Medicine | Admitting: Family Medicine

## 2010-08-04 DIAGNOSIS — Z1231 Encounter for screening mammogram for malignant neoplasm of breast: Secondary | ICD-10-CM | POA: Insufficient documentation

## 2010-08-04 DIAGNOSIS — Z139 Encounter for screening, unspecified: Secondary | ICD-10-CM

## 2010-08-26 ENCOUNTER — Encounter: Payer: Self-pay | Admitting: Family Medicine

## 2010-09-08 ENCOUNTER — Encounter: Payer: Self-pay | Admitting: Family Medicine

## 2010-09-15 ENCOUNTER — Encounter: Payer: Self-pay | Admitting: Family Medicine

## 2010-09-15 ENCOUNTER — Ambulatory Visit: Payer: Medicare Other | Admitting: Family Medicine

## 2010-09-28 ENCOUNTER — Encounter: Payer: Self-pay | Admitting: Family Medicine

## 2010-09-29 ENCOUNTER — Encounter: Payer: Self-pay | Admitting: Family Medicine

## 2010-09-30 ENCOUNTER — Ambulatory Visit: Payer: MEDICARE | Admitting: Family Medicine

## 2010-11-11 ENCOUNTER — Telehealth: Payer: Self-pay | Admitting: Family Medicine

## 2010-11-11 NOTE — Telephone Encounter (Signed)
DISCHARGED

## 2011-03-06 ENCOUNTER — Encounter (HOSPITAL_COMMUNITY): Payer: Self-pay | Admitting: *Deleted

## 2011-03-06 ENCOUNTER — Emergency Department (HOSPITAL_COMMUNITY): Payer: Medicare Other

## 2011-03-06 ENCOUNTER — Emergency Department (HOSPITAL_COMMUNITY)
Admission: EM | Admit: 2011-03-06 | Discharge: 2011-03-07 | Disposition: A | Discharge status: Home / Self Care | Payer: Medicare Other | Attending: Emergency Medicine | Admitting: Emergency Medicine

## 2011-03-06 ENCOUNTER — Other Ambulatory Visit: Payer: Self-pay

## 2011-03-06 DIAGNOSIS — M545 Low back pain, unspecified: Secondary | ICD-10-CM | POA: Insufficient documentation

## 2011-03-06 DIAGNOSIS — R079 Chest pain, unspecified: Secondary | ICD-10-CM | POA: Insufficient documentation

## 2011-03-06 DIAGNOSIS — J45909 Unspecified asthma, uncomplicated: Secondary | ICD-10-CM | POA: Insufficient documentation

## 2011-03-06 DIAGNOSIS — G8929 Other chronic pain: Secondary | ICD-10-CM | POA: Insufficient documentation

## 2011-03-06 DIAGNOSIS — G5600 Carpal tunnel syndrome, unspecified upper limb: Secondary | ICD-10-CM | POA: Insufficient documentation

## 2011-03-06 DIAGNOSIS — M549 Dorsalgia, unspecified: Secondary | ICD-10-CM | POA: Insufficient documentation

## 2011-03-06 DIAGNOSIS — M5020 Other cervical disc displacement, unspecified cervical region: Secondary | ICD-10-CM | POA: Insufficient documentation

## 2011-03-06 DIAGNOSIS — M5412 Radiculopathy, cervical region: Secondary | ICD-10-CM | POA: Insufficient documentation

## 2011-03-06 DIAGNOSIS — M1710 Unilateral primary osteoarthritis, unspecified knee: Secondary | ICD-10-CM | POA: Insufficient documentation

## 2011-03-06 DIAGNOSIS — R1084 Generalized abdominal pain: Secondary | ICD-10-CM | POA: Insufficient documentation

## 2011-03-06 LAB — URINALYSIS, ROUTINE W REFLEX MICROSCOPIC
Bilirubin Urine: NEGATIVE
Glucose, UA: NEGATIVE mg/dL
Hgb urine dipstick: NEGATIVE
Ketones, ur: NEGATIVE mg/dL
Leukocytes, UA: NEGATIVE
Nitrite: NEGATIVE
Protein, ur: NEGATIVE mg/dL
Specific Gravity, Urine: 1.02 (ref 1.005–1.030)
Urobilinogen, UA: 0.2 mg/dL (ref 0.0–1.0)
pH: 5.5 (ref 5.0–8.0)

## 2011-03-06 LAB — COMPREHENSIVE METABOLIC PANEL
ALT: 14 U/L (ref 0–35)
AST: 15 U/L (ref 0–37)
Albumin: 3.5 g/dL (ref 3.5–5.2)
Alkaline Phosphatase: 127 U/L — ABNORMAL HIGH (ref 39–117)
BUN: 19 mg/dL (ref 6–23)
CO2: 26 mEq/L (ref 19–32)
Calcium: 10.1 mg/dL (ref 8.4–10.5)
Chloride: 107 mEq/L (ref 96–112)
Creatinine, Ser: 0.75 mg/dL (ref 0.50–1.10)
GFR calc Af Amer: >90 mL/min (ref >90)
GFR calc non Af Amer: >90 mL/min (ref >90)
Glucose, Bld: 111 mg/dL — ABNORMAL HIGH (ref 70–99)
Potassium: 3.9 mEq/L (ref 3.5–5.1)
Sodium: 141 mEq/L (ref 135–145)
Total Bilirubin: 0.1 mg/dL — ABNORMAL LOW (ref 0.3–1.2)
Total Protein: 7.5 g/dL (ref 6.0–8.3)

## 2011-03-06 LAB — LIPASE, BLOOD: Lipase: 41 U/L (ref 11–59)

## 2011-03-06 LAB — PREGNANCY, URINE: Preg Test, Ur: NEGATIVE

## 2011-03-06 LAB — CBC
HCT: 31.9 % — ABNORMAL LOW (ref 36.0–46.0)
Hemoglobin: 10.4 g/dL — ABNORMAL LOW (ref 12.0–15.0)
MCH: 27.7 pg (ref 26.0–34.0)
MCHC: 32.6 g/dL (ref 30.0–36.0)
MCV: 84.8 fL (ref 78.0–100.0)
Platelets: 305 10*3/uL (ref 150–400)
RBC: 3.76 MIL/uL — ABNORMAL LOW (ref 3.87–5.11)
RDW: 15.4 % (ref 11.5–15.5)
WBC: 9.7 10*3/uL (ref 4.0–10.5)

## 2011-03-06 MED ORDER — OXYCODONE-ACETAMINOPHEN 5-325 MG PO TABS: 1.0000 | ORAL_TABLET | Freq: Four times a day (QID) | ORAL | Status: AC | PRN

## 2011-03-06 MED ORDER — SODIUM CHLORIDE 0.9 % IV BOLUS (SEPSIS)
250.0000 mL | Freq: Once | INTRAVENOUS | Status: AC
  Administered 2011-03-06: 250 mL via INTRAVENOUS

## 2011-03-06 MED ORDER — ONDANSETRON HCL 4 MG/2ML IJ SOLN
4.0000 mg | Freq: Once | INTRAMUSCULAR | Status: AC
  Administered 2011-03-06: 4 mg via INTRAVENOUS
  Filled 2011-03-06: qty 2

## 2011-03-06 MED ORDER — HYDROMORPHONE HCL PF 1 MG/ML IJ SOLN
1.0000 mg | Freq: Once | INTRAMUSCULAR | Status: AC
  Administered 2011-03-06: 1 mg via INTRAVENOUS
  Filled 2011-03-06: qty 1

## 2011-03-06 MED ORDER — SODIUM CHLORIDE 0.9 % IV SOLN
INTRAVENOUS | Status: DC
  Administered 2011-03-06: 23:00:00 via INTRAVENOUS

## 2011-03-06 MED ORDER — IOHEXOL 300 MG/ML  SOLN
120.0000 mL | Freq: Once | INTRAMUSCULAR | Status: AC | PRN
  Administered 2011-03-06: 120 mL via INTRAVENOUS

## 2011-03-06 NOTE — ED Notes (Signed)
Abdominal and lower back pain °

## 2011-03-06 NOTE — ED Provider Notes (Signed)
Pt signed out to me by Dr. Deretha Emory, pending abdominal CT scan and dispo will be per those results.  CT scan shows no acute findings.  Pt to be discharged with pain medication and will need to arrange for outpatient followup.    Ethelda Chick, MD 03/06/11 [phone]

## 2011-03-06 NOTE — ED Provider Notes (Signed)
History     CSN: [id number]  Arrival date & time 03/06/11  1839   First MD Initiated Contact with Patient 03/06/11 1937      Chief Complaint  Patient presents with  . Abdominal Pain    (Consider location/radiation/quality/duration/timing/severity/associated sxs/prior treatment) Patient is a 46 y.o. female presenting with abdominal pain. The history is provided by the patient.  Abdominal Pain The primary symptoms of the illness include abdominal pain. The primary symptoms of the illness do not include fever, shortness of breath, nausea, vomiting, diarrhea or dysuria. The current episode started more than 2 days ago. The onset of the illness was sudden. The problem has been gradually worsening.  The abdominal pain began more than 2 days ago. The pain came on suddenly. The abdominal pain has been gradually worsening since its onset. The abdominal pain is generalized. The abdominal pain radiates to the back. The severity of the abdominal pain is 10/10. Exacerbated by: Nothing.  The patient states that she believes she is currently not pregnant. Additional symptoms associated with the illness include back pain. Symptoms associated with the illness do not include chills, diaphoresis, heartburn, urgency, hematuria or frequency. Significant associated medical issues do not include PUD, GERD, inflammatory bowel disease, diabetes, gallstones or diverticulitis.    Past Medical History  Diagnosis Date  . HNP (herniated nucleus pulposus), cervical   . Complete rupture of rotator cuff   . Cervical radiculopathy   . Ankle pain, left   . Arthritis of knee, degenerative   . Knee pain   . Carpal tunnel syndrome   . Urinary incontinence   . Gynecomastia   . Asthma   . Depression   . OA (osteoarthritis) of knee   . Chronic back pain   . Obesity   . Hyperlipidemia   . HNP (herniated nucleus pulposus), cervical   . Complete rupture of rotator cuff     Past Surgical History  Procedure Date  .  Partial hysterectomy   . Tube insert in right ear 2003  . Arthroscopy  left knee 08/14/2008    Dr. Romeo Apple     Family History  Problem Relation Age of Onset  . Diabetes Mother   . Hypertension Mother   . Cancer Father     left nephrectomy   . Hypertension Father   . Cancer Sister     breast cancer  . Hypertension Sister   . Hypertension Brother   . Diabetes Maternal Grandmother   . Hypertension Maternal Grandmother   . Hypertension Maternal Grandfather     History  Substance Use Topics  . Smoking status: Never Smoker   . Smokeless tobacco: Not on file  . Alcohol Use: No    OB History    Grav Para Term Preterm Abortions TAB SAB Ect Mult Living                  Review of Systems  Constitutional: Negative for fever, chills and diaphoresis.  HENT: Negative for congestion, sore throat and neck pain.   Respiratory: Negative for cough, shortness of breath and wheezing.   Cardiovascular: Positive for chest pain.  Gastrointestinal: Positive for abdominal pain. Negative for heartburn, nausea, vomiting and diarrhea.  Genitourinary: Negative for dysuria, urgency, frequency and hematuria.  Musculoskeletal: Positive for back pain.  Skin: Negative for rash.  Neurological: Negative for headaches.  Hematological: Does not bruise/bleed easily.    Allergies  Penicillins  Home Medications   Current Outpatient Rx  Name Route Sig Dispense Refill  .  GABAPENTIN (PHN) 300 MG PO TABS Oral Take 900 mg by mouth at bedtime.     . IPRATROPIUM-ALBUTEROL 0.5-2.5 (3) MG/3ML IN SOLN Nebulization Take 3 mLs by nebulization. Use with nebulizer machine two times a day       . LOVASTATIN 40 MG PO TABS Oral Take 80 mg by mouth at bedtime.     . METHOCARBAMOL 500 MG PO TABS Oral Take 1 tablet (500 mg total) by mouth every 6 (six) hours as needed. TAKE 2 TABLETS EVERY 6   HOURS AS NEEDED FORMUSCLE SPASM. 60 tablet 0  . MONTELUKAST SODIUM 10 MG PO TABS Oral Take 10 mg by mouth every morning.        Marland Kitchen PANTOPRAZOLE SODIUM 40 MG PO TBEC Oral Take 40 mg by mouth 2 (two) times daily.       BP 132/92  Pulse 66  Temp(Src) 97.9 F (36.6 C) (Oral)  Resp 20  Ht 5\' 4"  (1.626 m)  Wt 298 lb (135.172 kg)  BMI 51.15 kg/m2  SpO2 100%  Physical Exam  Nursing note and vitals reviewed. Constitutional: She is oriented to person, place, and time. She appears well-developed and well-nourished. No distress.  HENT:  Head: Normocephalic and atraumatic.  Mouth/Throat: Oropharynx is clear and moist.  Eyes: Conjunctivae and EOM are normal. Pupils are equal, round, and reactive to light.  Neck: Normal range of motion. Neck supple.  Cardiovascular: Normal rate, regular rhythm, normal heart sounds and intact distal pulses.   No murmur heard. Pulmonary/Chest: Effort normal and breath sounds normal. No respiratory distress. She exhibits no tenderness.  Abdominal: Soft. Bowel sounds are normal. There is no tenderness. There is no guarding.  Musculoskeletal: Normal range of motion.  Neurological: She is alert and oriented to person, place, and time. No cranial nerve deficit. She exhibits normal muscle tone. Coordination normal.  Skin: Skin is warm. No rash noted.    ED Course  Procedures (including critical care time)   Labs Reviewed  URINALYSIS, ROUTINE W REFLEX MICROSCOPIC  PREGNANCY, URINE  CBC  COMPREHENSIVE METABOLIC PANEL  LIPASE, BLOOD   Dg Chest 2 View  03/06/2011  *RADIOLOGY REPORT*  Clinical Data: Back and bilateral rib pain.  Chronic back pain.  CHEST - 2 VIEW  Comparison: 09/14/2009.  Findings:  Cardiopericardial silhouette within normal limits. Mediastinal contours normal. Trachea midline.  No airspace disease or effusion.  No interval change compared to prior.  Bilateral glenohumeral osteoarthritis is present.  IMPRESSION: No active cardiopulmonary disease.  No interval change.  Original Report Authenticated By: Andreas Newport, M.D.    Date: 03/06/2011  Rate: 65  Rhythm: normal sinus  rhythm  QRS Axis: normal  Intervals: normal  ST/T Wave abnormalities: normal  Conduction Disutrbances:none  Narrative Interpretation:   Old EKG Reviewed: none available  Results for orders placed during the hospital encounter of 03/06/11  URINALYSIS, ROUTINE W REFLEX MICROSCOPIC      Component Value Range   Color, Urine YELLOW  YELLOW    APPearance CLEAR  CLEAR    Specific Gravity, Urine 1.020  1.005 - 1.030    pH 5.5  5.0 - 8.0    Glucose, UA NEGATIVE  NEGATIVE (mg/dL)   Hgb urine dipstick NEGATIVE  NEGATIVE    Bilirubin Urine NEGATIVE  NEGATIVE    Ketones, ur NEGATIVE  NEGATIVE (mg/dL)   Protein, ur NEGATIVE  NEGATIVE (mg/dL)   Urobilinogen, UA 0.2  0.0 - 1.0 (mg/dL)   Nitrite NEGATIVE  NEGATIVE    Leukocytes, UA  NEGATIVE  NEGATIVE   PREGNANCY, URINE      Component Value Range   Preg Test, Ur NEGATIVE       1. Abdominal pain       MDM   Patient with onset of generalized abdominal pain radiating to the back starting on Friday no nausea vomiting or diarrhea with it patient some radiation of pain into bilateral rib area pain is described as 10 out of 10 long-standing history of low back pain which is unchanged, she has never had abdominal pain like this in the past. Urinalysis and pregnancy test without significant findings EKG without significant findings chest x-ray negative. Rest of labs CBC complete metabolic panel including lipase are pending along with CT with contrast. Patient will be turned over to the evening ED physician for followup of lab and CT results. If negative patient can be discharged home with the pain control medicine. If positive appropriate disposition based on CT findings.        Shelda Jakes, MD 03/06/11 2221

## 2011-04-28 ENCOUNTER — Other Ambulatory Visit: Payer: Self-pay | Admitting: Family Medicine

## 2011-06-15 ENCOUNTER — Other Ambulatory Visit: Payer: Self-pay | Admitting: Family Medicine

## 2011-06-26 ENCOUNTER — Other Ambulatory Visit: Payer: Self-pay

## 2011-06-26 DIAGNOSIS — G4700 Insomnia, unspecified: Secondary | ICD-10-CM

## 2011-07-04 ENCOUNTER — Ambulatory Visit: Payer: Medicare Other | Attending: Neurology | Admitting: Sleep Medicine

## 2011-07-04 DIAGNOSIS — G4700 Insomnia, unspecified: Secondary | ICD-10-CM

## 2011-07-04 DIAGNOSIS — R5383 Other fatigue: Secondary | ICD-10-CM | POA: Insufficient documentation

## 2011-07-04 DIAGNOSIS — R5381 Other malaise: Secondary | ICD-10-CM | POA: Insufficient documentation

## 2011-07-04 DIAGNOSIS — G4733 Obstructive sleep apnea (adult) (pediatric): Secondary | ICD-10-CM | POA: Insufficient documentation

## 2011-07-05 NOTE — Progress Notes (Signed)
Pt stated she is currently on PAP therapy and has been for 8-9 yrs using a nasal mask. Pt stated she had difficulty getting to sleep without PAP therapy.

## 2011-07-14 NOTE — Procedures (Signed)
NAMESIDDA, HUMM            ACCOUNT NO.:  [id number]  MEDICAL RECORD NO.:  [id number]          PATIENT TYPE:  OUT  LOCATION:  SLEEP CENTER                 FACILITY:  [facility]  PHYSICIAN:  Joneric Streight A. Gerilyn Pilgrim, M.D. DATE OF BIRTH:  11-27-1965  DATE OF STUDY:  07/04/2011                           NOCTURNAL POLYSOMNOGRAM  REFERRING PHYSICIAN:  Solstice Lastinger A. Gerilyn Pilgrim, M.D.  INDICATION FOR STUDY:  This is a 46 year old, who presents with restless sleep, frequent arousal, and fatigue, and the study has been done to evaluate for obstructive sleep apnea syndrome.  EPWORTH SLEEPINESS SCORE:  Zero. BMI 57.  MEDICATIONS:  None.  SLEEP ARCHITECTURE:  The total recording time is 398 minutes.  Sleep efficiency is quite low at 26%. Sleep latency 75 minutes.  REM latency 247 minutes.  Stage N1 of 36.8%, N2 of 54.5%, N3 of 0%, and REM sleep 8.6%.  RESPIRATORY DATA:  Baseline oxygen saturation 98, lowest saturation 83 during non-REM sleep.  The diagnostic AHI is 25.3 and RDI 26.4.  CARDIAC DATA:  Average heart rate is 69 with no significant dysrhythmias observed.  MOVEMENT-PARASOMNIA:  PLM index 28.1.  IMPRESSIONS-RECOMMENDATIONS: 1. Moderate obstructive sleep apnea syndrome. 2. Abnormal sleep architecture with very poor sleep efficiency.  Recommendation:  Formal CPAP titration study.     Jessica Stevens A. Gerilyn Pilgrim, M.D.    KAD/MEDQ  D:  07/13/2011 09:04:55  T:  07/14/2011 02:42:15  Job:  [id number]

## 2011-08-15 ENCOUNTER — Other Ambulatory Visit (HOSPITAL_COMMUNITY): Payer: Self-pay | Admitting: Family Medicine

## 2011-08-15 DIAGNOSIS — Z139 Encounter for screening, unspecified: Secondary | ICD-10-CM

## 2011-08-21 ENCOUNTER — Ambulatory Visit (HOSPITAL_COMMUNITY)
Admission: RE | Admit: 2011-08-21 | Discharge: 2011-08-21 | Disposition: A | Discharge status: Home / Self Care | Payer: Medicare Other | Source: Ambulatory Visit | Attending: Family Medicine | Admitting: Family Medicine

## 2011-08-21 DIAGNOSIS — Z1231 Encounter for screening mammogram for malignant neoplasm of breast: Secondary | ICD-10-CM | POA: Insufficient documentation

## 2011-08-21 DIAGNOSIS — Z139 Encounter for screening, unspecified: Secondary | ICD-10-CM

## 2011-10-03 ENCOUNTER — Emergency Department (HOSPITAL_COMMUNITY)
Admission: EM | Admit: 2011-10-03 | Discharge: 2011-10-03 | Disposition: A | Discharge status: Home / Self Care | Payer: Medicare Other | Attending: Emergency Medicine | Admitting: Emergency Medicine

## 2011-10-03 ENCOUNTER — Encounter (HOSPITAL_COMMUNITY): Payer: Self-pay

## 2011-10-03 DIAGNOSIS — M545 Low back pain, unspecified: Secondary | ICD-10-CM | POA: Insufficient documentation

## 2011-10-03 DIAGNOSIS — E785 Hyperlipidemia, unspecified: Secondary | ICD-10-CM | POA: Insufficient documentation

## 2011-10-03 DIAGNOSIS — E669 Obesity, unspecified: Secondary | ICD-10-CM | POA: Insufficient documentation

## 2011-10-03 DIAGNOSIS — M1710 Unilateral primary osteoarthritis, unspecified knee: Secondary | ICD-10-CM | POA: Insufficient documentation

## 2011-10-03 DIAGNOSIS — G5600 Carpal tunnel syndrome, unspecified upper limb: Secondary | ICD-10-CM | POA: Insufficient documentation

## 2011-10-03 DIAGNOSIS — G8929 Other chronic pain: Secondary | ICD-10-CM | POA: Insufficient documentation

## 2011-10-03 MED ORDER — CYCLOBENZAPRINE HCL 10 MG PO TABS
10.0000 mg | ORAL_TABLET | Freq: Once | ORAL | Status: AC
  Administered 2011-10-03: 10 mg via ORAL
  Filled 2011-10-03: qty 1

## 2011-10-03 MED ORDER — DEXAMETHASONE SODIUM PHOSPHATE 10 MG/ML IJ SOLN: 10.0000 mg | Freq: Once | INTRAMUSCULAR | Status: DC

## 2011-10-03 MED ORDER — CYCLOBENZAPRINE HCL 5 MG PO TABS
5.0000 mg | ORAL_TABLET | Freq: Three times a day (TID) | ORAL | Status: AC | PRN
Start: 2011-10-03 — End: 2011-10-13

## 2011-10-03 MED ORDER — DEXAMETHASONE SODIUM PHOSPHATE 10 MG/ML IJ SOLN
10.0000 mg | Freq: Once | INTRAMUSCULAR | Status: AC
  Administered 2011-10-03: 10 mg via INTRAMUSCULAR
  Filled 2011-10-03: qty 1

## 2011-10-03 MED ORDER — TRAMADOL HCL 50 MG PO TABS: 50.0000 mg | ORAL_TABLET | Freq: Four times a day (QID) | ORAL | Status: AC | PRN

## 2011-10-03 NOTE — ED Notes (Signed)
Pt c/o generalized body aches x1 month. Pt states she was rx pain meds and muscle relaxes to help with her muscle spasms and bulging discs in lower back and neck. Pt states she ran out of her meds a month ago.

## 2011-10-03 NOTE — ED Notes (Signed)
Pt reports having generalized body aches, out of her meds (muscle relaxers/pain meds), "disqualified by my md"  Out of meds for 1 month

## 2011-10-05 NOTE — ED Provider Notes (Signed)
History     CSN: [id number]  Arrival date & time 10/03/11  1404   First MD Initiated Contact with Patient 10/03/11 1504      Chief Complaint  Patient presents with  . Generalized Body Aches  . Medication Refill    (Consider location/radiation/quality/duration/timing/severity/associated sxs/prior treatment) HPI Comments: Jessica Stevens presents with acute on chronic generalized body aches,  But most focused in her bilateral knees and lower back secondary to osteoarthitis.  She was recently discharged from her pcp's practice as she missed too many scheduled appointments.  She is in the process of trying to find a new physician.  In the interim, she is requesting a steroid pain shot as she states this has helped her chronic pain in the past along with muscle relaxers and pain medicine.  She denies fevers, chills,  Rash or joint swelling.   Patient denies any new injury specifically.  There is not radiation into her lower extremities.  There has been no weakness or numbness in the lower extremities and no urinary or bowel retention or incontinence.  Patient does not have a history of cancer or IVDU.   The history is provided by the patient.    Past Medical History  Diagnosis Date  . HNP (herniated nucleus pulposus), cervical   . Complete rupture of rotator cuff   . Cervical radiculopathy   . Ankle pain, left   . Arthritis of knee, degenerative   . Knee pain   . Carpal tunnel syndrome   . Urinary incontinence   . Gynecomastia   . Asthma   . Depression   . OA (osteoarthritis) of knee   . Chronic back pain   . Obesity   . Hyperlipidemia   . HNP (herniated nucleus pulposus), cervical   . Complete rupture of rotator cuff     Past Surgical History  Procedure Date  . Partial hysterectomy   . Tube insert in right ear 2003  . Arthroscopy  left knee 08/14/2008    Dr. Romeo Apple     Family History  Problem Relation Age of Onset  . Diabetes Mother   . Hypertension Mother   .  Cancer Father     left nephrectomy   . Hypertension Father   . Cancer Sister     breast cancer  . Hypertension Sister   . Hypertension Brother   . Diabetes Maternal Grandmother   . Hypertension Maternal Grandmother   . Hypertension Maternal Grandfather     History  Substance Use Topics  . Smoking status: Never Smoker   . Smokeless tobacco: Not on file  . Alcohol Use: No    OB History    Grav Para Term Preterm Abortions TAB SAB Ect Mult Living                  Review of Systems  Constitutional: Negative for fever.  Respiratory: Negative for shortness of breath.   Cardiovascular: Negative for chest pain and leg swelling.  Gastrointestinal: Negative for abdominal pain, constipation and abdominal distention.  Genitourinary: Negative for dysuria, urgency, frequency, flank pain and difficulty urinating.  Musculoskeletal: Positive for back pain and arthralgias. Negative for joint swelling and gait problem.  Skin: Negative for rash.  Neurological: Negative for weakness and numbness.    Allergies  Penicillins  Home Medications   Current Outpatient Rx  Name Route Sig Dispense Refill  . BIOFLEX PO Oral Take 1 tablet by mouth daily.    . GOODY PM PO Oral Take  1 packet by mouth every 4 (four) hours as needed. Pain    . IBUPROFEN 200 MG PO TABS Oral Take 400 mg by mouth every 6 (six) hours as needed. Pain    . CYCLOBENZAPRINE HCL 5 MG PO TABS Oral Take 1 tablet (5 mg total) by mouth 3 (three) times daily as needed for muscle spasms. 15 tablet 0  . TRAMADOL HCL 50 MG PO TABS Oral Take 1 tablet (50 mg total) by mouth every 6 (six) hours as needed for pain. 15 tablet 0    BP 120/76  Pulse 67  Temp 97.4 F (36.3 C) (Oral)  Resp 20  Ht 5\' 6"  (1.676 m)  Wt 292 lb (132.45 kg)  BMI 47.13 kg/m2  SpO2 100%  Physical Exam  Nursing note and vitals reviewed. Constitutional: She appears well-developed and well-nourished.  HENT:  Head: Normocephalic.  Eyes: Conjunctivae are  normal.  Neck: Normal range of motion. Neck supple.  Cardiovascular: Normal rate and intact distal pulses.        Pedal pulses normal.  Pulmonary/Chest: Effort normal.  Abdominal: Soft. Bowel sounds are normal. She exhibits no distension and no mass.  Musculoskeletal: Normal range of motion. She exhibits no edema.       Lumbar back: She exhibits tenderness. She exhibits no swelling, no edema and no spasm.       Paralumbar ttp.  Neurological: She is alert. She has normal strength. She displays no atrophy and no tremor. No sensory deficit. Gait normal.  Reflex Scores:      Patellar reflexes are 2+ on the right side and 2+ on the left side.      Achilles reflexes are 2+ on the right side and 2+ on the left side.      No strength deficit noted in hip and knee flexor and extensor muscle groups.  Ankle flexion and extension intact. Equal grip strength.  Skin: Skin is warm and dry.  Psychiatric: She has a normal mood and affect.    ED Course  Procedures (including critical care time)  Labs Reviewed - No data to display No results found.   1. Chronic low back pain     Pt given decadron injection,  Flexeril tablet.  MDM  EPIC visits with pcp reviewed.  [place] narcotic database also reviewed.  Pt prescribed flexeril, tramadol for chronic pain syndrome.  Encouraged to obtain pcp,  Referrals given.  No neuro deficit on exam or by history to suggest emergent or surgical presentation.  Also discussed worsened sx that should prompt immediate re-evaluation including distal weakness, bowel/bladder retention/incontinence.           Burgess Amor, PA 10/05/11 1350

## 2011-10-05 NOTE — ED Provider Notes (Signed)
Medical screening examination/treatment/procedure(s) were performed by non-physician practitioner and as supervising physician I was immediately available for consultation/collaboration.   Glynn Octave, MD 10/05/11 1436

## 2012-08-18 ENCOUNTER — Emergency Department (HOSPITAL_COMMUNITY)
Admission: EM | Admit: 2012-08-18 | Discharge: 2012-08-18 | Disposition: A | Discharge status: Home / Self Care | Payer: Medicare Other | Attending: Emergency Medicine | Admitting: Emergency Medicine

## 2012-08-18 ENCOUNTER — Encounter (HOSPITAL_COMMUNITY): Payer: Self-pay | Admitting: *Deleted

## 2012-08-18 DIAGNOSIS — Z8659 Personal history of other mental and behavioral disorders: Secondary | ICD-10-CM | POA: Insufficient documentation

## 2012-08-18 DIAGNOSIS — Z79899 Other long term (current) drug therapy: Secondary | ICD-10-CM | POA: Insufficient documentation

## 2012-08-18 DIAGNOSIS — M545 Low back pain, unspecified: Secondary | ICD-10-CM | POA: Insufficient documentation

## 2012-08-18 DIAGNOSIS — M25519 Pain in unspecified shoulder: Secondary | ICD-10-CM | POA: Insufficient documentation

## 2012-08-18 DIAGNOSIS — M5432 Sciatica, left side: Secondary | ICD-10-CM

## 2012-08-18 DIAGNOSIS — E669 Obesity, unspecified: Secondary | ICD-10-CM | POA: Insufficient documentation

## 2012-08-18 DIAGNOSIS — IMO0002 Reserved for concepts with insufficient information to code with codable children: Secondary | ICD-10-CM | POA: Insufficient documentation

## 2012-08-18 DIAGNOSIS — Z8669 Personal history of other diseases of the nervous system and sense organs: Secondary | ICD-10-CM | POA: Insufficient documentation

## 2012-08-18 DIAGNOSIS — Z8639 Personal history of other endocrine, nutritional and metabolic disease: Secondary | ICD-10-CM | POA: Insufficient documentation

## 2012-08-18 DIAGNOSIS — M5430 Sciatica, unspecified side: Secondary | ICD-10-CM | POA: Insufficient documentation

## 2012-08-18 DIAGNOSIS — Z862 Personal history of diseases of the blood and blood-forming organs and certain disorders involving the immune mechanism: Secondary | ICD-10-CM | POA: Insufficient documentation

## 2012-08-18 DIAGNOSIS — Z8742 Personal history of other diseases of the female genital tract: Secondary | ICD-10-CM | POA: Insufficient documentation

## 2012-08-18 DIAGNOSIS — J45909 Unspecified asthma, uncomplicated: Secondary | ICD-10-CM | POA: Insufficient documentation

## 2012-08-18 DIAGNOSIS — Z880 Allergy status to penicillin: Secondary | ICD-10-CM | POA: Insufficient documentation

## 2012-08-18 DIAGNOSIS — G8929 Other chronic pain: Secondary | ICD-10-CM | POA: Insufficient documentation

## 2012-08-18 DIAGNOSIS — M1710 Unilateral primary osteoarthritis, unspecified knee: Secondary | ICD-10-CM | POA: Insufficient documentation

## 2012-08-18 MED ORDER — OXYCODONE-ACETAMINOPHEN 5-325 MG PO TABS: 1.0000 | ORAL_TABLET | ORAL | Status: DC | PRN

## 2012-08-18 MED ORDER — IBUPROFEN 400 MG PO TABS
600.0000 mg | ORAL_TABLET | Freq: Once | ORAL | Status: AC
  Administered 2012-08-18: 600 mg via ORAL
  Filled 2012-08-18: qty 2

## 2012-08-18 MED ORDER — IBUPROFEN 600 MG PO TABS: 600.0000 mg | ORAL_TABLET | Freq: Three times a day (TID) | ORAL | Status: DC | PRN

## 2012-08-18 MED ORDER — OXYCODONE-ACETAMINOPHEN 5-325 MG PO TABS
1.0000 | ORAL_TABLET | Freq: Once | ORAL | Status: AC
  Administered 2012-08-18: 1 via ORAL
  Filled 2012-08-18: qty 1

## 2012-08-18 NOTE — ED Provider Notes (Signed)
History    This chart was scribed for Lyanne Co, MD by Leone Payor, ED Scribe. This patient was seen in room APA19/[facility]19 and the patient's care was started 10:19 AM.   CSN: [id number]  Arrival date & time 08/18/12  1003   First MD Initiated Contact with Patient 08/18/12 1016      Chief Complaint  Patient presents with  . Leg Pain     The history is provided by the patient. No language interpreter was used.    HPI Comments: Jessica Stevens is a 47 y.o. female who presents to the Emergency Department complaining of chronic, constant, increased pain to L leg, bilateral shoulders and back that has worsened in the last few days. Reports having similar symptoms in the past. She denies any recent injury or trauma. She denies any prescribed pain medications. She takes 4 ibuprofen occasionally but states it doesn't help much. She has h/o arthritis in hip and knees.     Past Medical History  Diagnosis Date  . HNP (herniated nucleus pulposus), cervical   . Complete rupture of rotator cuff   . Cervical radiculopathy   . Ankle pain, left   . Arthritis of knee, degenerative   . Knee pain   . Carpal tunnel syndrome   . Urinary incontinence   . Gynecomastia   . Asthma   . Depression   . OA (osteoarthritis) of knee   . Chronic back pain   . Obesity   . Hyperlipidemia   . HNP (herniated nucleus pulposus), cervical   . Complete rupture of rotator cuff     Past Surgical History  Procedure Laterality Date  . Partial hysterectomy    . Tube insert in right ear  2003  . Arthroscopy  left knee  08/14/2008    Dr. Romeo Apple     Family History  Problem Relation Age of Onset  . Diabetes Mother   . Hypertension Mother   . Cancer Father     left nephrectomy   . Hypertension Father   . Cancer Sister     breast cancer  . Hypertension Sister   . Hypertension Brother   . Diabetes Maternal Grandmother   . Hypertension Maternal Grandmother   . Hypertension Maternal Grandfather      History  Substance Use Topics  . Smoking status: Never Smoker   . Smokeless tobacco: Not on file  . Alcohol Use: No    OB History   Grav Para Term Preterm Abortions TAB SAB Ect Mult Living                  Review of Systems A complete 10 system review of systems was obtained and all systems are negative except as noted in the HPI and PMH.   Allergies  Penicillins  Home Medications   Current Outpatient Rx  Name  Route  Sig  Dispense  Refill  . Bioflavonoid Products (BIOFLEX PO)   Oral   Take 1 tablet by mouth daily.         . Diphenhydramine-APAP, sleep, (GOODY PM PO)   Oral   Take 1 packet by mouth every 4 (four) hours as needed. Pain         . ibuprofen (ADVIL) 200 MG tablet   Oral   Take 400 mg by mouth every 6 (six) hours as needed. Pain           BP 150/94  Pulse 53  Temp(Src) 97.8 F (36.6 C)  Resp  16  Ht 5' (1.524 m)  Wt 292 lb (132.45 kg)  BMI 57.03 kg/m2  SpO2 100%  Physical Exam  Nursing note and vitals reviewed. Constitutional: She is oriented to person, place, and time. She appears well-developed and well-nourished.  HENT:  Head: Normocephalic.  Eyes: EOM are normal.  Neck: Normal range of motion.  Cardiovascular: Normal rate, regular rhythm and normal heart sounds.   No murmur heard. Pulmonary/Chest: Effort normal and breath sounds normal. No respiratory distress. She has no wheezes. She has no rales. She exhibits no tenderness.  Abdominal: She exhibits no distension.  Musculoskeletal: Normal range of motion.  Tenderness over para lumbar spinal muscles. Tenderness in L sciatic notch.   Neurological: She is alert and oriented to person, place, and time.  Psychiatric: She has a normal mood and affect.    ED Course  Procedures (including critical care time)  DIAGNOSTIC STUDIES: Oxygen Saturation is 100% on RA, normal by my interpretation.    COORDINATION OF CARE: 10:32 AM Discussed treatment plan with pt at bedside and pt  agreed to plan.   Labs Reviewed - No data to display No results found.   1. Sciatica neuralgia, left       MDM  11:39 AM Patient feels much better at this time.  This is likely left-sided sciatica.  No indication or imaging.  No weakness of the left lower extremity.  Discharge home in good condition.  PCP followup.  Pain medicines.  Understands to return to the ER for new or worsening symptoms  I personally performed the services described in this documentation, which was scribed in my presence. The recorded information has been reviewed and is accurate.      Lyanne Co, MD 08/18/12 1139

## 2012-08-18 NOTE — ED Notes (Signed)
Pt c/o left hip, left knee, right hip pain that pt has had problems with but pain became worse this weekend, pt is also concerned because she has been bruising easily that pt noticed Tuesday, pt has hx of arthritis in hip and knees, has been seeing Dr. Romeo Apple for the pain, denies any injury.

## 2012-10-23 ENCOUNTER — Encounter (HOSPITAL_COMMUNITY): Payer: Self-pay | Admitting: Emergency Medicine

## 2012-10-23 ENCOUNTER — Emergency Department (HOSPITAL_COMMUNITY)
Admission: EM | Admit: 2012-10-23 | Discharge: 2012-10-23 | Disposition: A | Discharge status: Home / Self Care | Payer: Medicare Other | Attending: Emergency Medicine | Admitting: Emergency Medicine

## 2012-10-23 ENCOUNTER — Emergency Department (HOSPITAL_COMMUNITY): Payer: Medicare Other

## 2012-10-23 DIAGNOSIS — M1710 Unilateral primary osteoarthritis, unspecified knee: Secondary | ICD-10-CM | POA: Insufficient documentation

## 2012-10-23 DIAGNOSIS — M1712 Unilateral primary osteoarthritis, left knee: Secondary | ICD-10-CM

## 2012-10-23 DIAGNOSIS — Z8639 Personal history of other endocrine, nutritional and metabolic disease: Secondary | ICD-10-CM | POA: Insufficient documentation

## 2012-10-23 DIAGNOSIS — Y939 Activity, unspecified: Secondary | ICD-10-CM | POA: Insufficient documentation

## 2012-10-23 DIAGNOSIS — IMO0002 Reserved for concepts with insufficient information to code with codable children: Secondary | ICD-10-CM | POA: Insufficient documentation

## 2012-10-23 DIAGNOSIS — Z79899 Other long term (current) drug therapy: Secondary | ICD-10-CM | POA: Insufficient documentation

## 2012-10-23 DIAGNOSIS — E669 Obesity, unspecified: Secondary | ICD-10-CM | POA: Insufficient documentation

## 2012-10-23 DIAGNOSIS — J45909 Unspecified asthma, uncomplicated: Secondary | ICD-10-CM | POA: Insufficient documentation

## 2012-10-23 DIAGNOSIS — Y929 Unspecified place or not applicable: Secondary | ICD-10-CM | POA: Insufficient documentation

## 2012-10-23 DIAGNOSIS — Z8669 Personal history of other diseases of the nervous system and sense organs: Secondary | ICD-10-CM | POA: Insufficient documentation

## 2012-10-23 DIAGNOSIS — W108XXA Fall (on) (from) other stairs and steps, initial encounter: Secondary | ICD-10-CM | POA: Insufficient documentation

## 2012-10-23 DIAGNOSIS — Z8742 Personal history of other diseases of the female genital tract: Secondary | ICD-10-CM | POA: Insufficient documentation

## 2012-10-23 DIAGNOSIS — Z880 Allergy status to penicillin: Secondary | ICD-10-CM | POA: Insufficient documentation

## 2012-10-23 DIAGNOSIS — Z862 Personal history of diseases of the blood and blood-forming organs and certain disorders involving the immune mechanism: Secondary | ICD-10-CM | POA: Insufficient documentation

## 2012-10-23 DIAGNOSIS — Z8659 Personal history of other mental and behavioral disorders: Secondary | ICD-10-CM | POA: Insufficient documentation

## 2012-10-23 DIAGNOSIS — S8000XA Contusion of unspecified knee, initial encounter: Secondary | ICD-10-CM | POA: Insufficient documentation

## 2012-10-23 DIAGNOSIS — G8929 Other chronic pain: Secondary | ICD-10-CM | POA: Insufficient documentation

## 2012-10-23 DIAGNOSIS — S8002XA Contusion of left knee, initial encounter: Secondary | ICD-10-CM

## 2012-10-23 DIAGNOSIS — Z8739 Personal history of other diseases of the musculoskeletal system and connective tissue: Secondary | ICD-10-CM | POA: Insufficient documentation

## 2012-10-23 MED ORDER — KETOROLAC TROMETHAMINE 10 MG PO TABS
10.0000 mg | ORAL_TABLET | Freq: Once | ORAL | Status: AC
  Administered 2012-10-23: 10 mg via ORAL
  Filled 2012-10-23: qty 1

## 2012-10-23 MED ORDER — ONDANSETRON HCL 4 MG PO TABS
4.0000 mg | ORAL_TABLET | Freq: Once | ORAL | Status: AC
  Administered 2012-10-23: 4 mg via ORAL
  Filled 2012-10-23: qty 1

## 2012-10-23 MED ORDER — DICLOFENAC SODIUM 75 MG PO TBEC: 75.0000 mg | DELAYED_RELEASE_TABLET | Freq: Two times a day (BID) | ORAL | Status: DC

## 2012-10-23 MED ORDER — HYDROCODONE-ACETAMINOPHEN 5-325 MG PO TABS
2.0000 | ORAL_TABLET | Freq: Once | ORAL | Status: AC
  Administered 2012-10-23: 2 via ORAL
  Filled 2012-10-23: qty 2

## 2012-10-23 MED ORDER — HYDROCODONE-ACETAMINOPHEN 7.5-325 MG PO TABS: 1.0000 | ORAL_TABLET | ORAL | Status: DC | PRN

## 2012-10-23 NOTE — ED Notes (Signed)
Going up steps and fell onto lt knee , Ice pack applied,

## 2012-10-23 NOTE — ED Notes (Signed)
Pt states she fell going up steps and c/o left knee pain.

## 2012-10-23 NOTE — ED Provider Notes (Signed)
Medical screening examination/treatment/procedure(s) were performed by non-physician practitioner and as supervising physician I was immediately available for consultation/collaboration.  Shawntelle Ungar R. Eveleigh Crumpler, MD 10/23/12 2355 

## 2012-10-23 NOTE — ED Provider Notes (Signed)
CSN: [id number]     Arrival date & time 10/23/12  1955 History   First MD Initiated Contact with Patient 10/23/12 2134     Chief Complaint  Patient presents with  . Fall  . Knee Pain   (Consider location/radiation/quality/duration/timing/severity/associated sxs/prior Treatment) Patient is a 47 y.o. female presenting with fall and knee pain. The history is provided by the patient.  Fall This is a new problem. The current episode started today. The problem occurs constantly. The problem has been gradually worsening. Associated symptoms include arthralgias and neck pain. Pertinent negatives include no abdominal pain, chest pain, coughing, numbness or weakness. The symptoms are aggravated by standing, walking and bending. She has tried nothing for the symptoms. The treatment provided no relief.  Knee Pain Associated symptoms: neck pain   Associated symptoms: no back pain     Past Medical History  Diagnosis Date  . HNP (herniated nucleus pulposus), cervical   . Complete rupture of rotator cuff   . Cervical radiculopathy   . Ankle pain, left   . Arthritis of knee, degenerative   . Knee pain   . Carpal tunnel syndrome   . Urinary incontinence   . Gynecomastia   . Asthma   . Depression   . OA (osteoarthritis) of knee   . Chronic back pain   . Obesity   . Hyperlipidemia   . HNP (herniated nucleus pulposus), cervical   . Complete rupture of rotator cuff    Past Surgical History  Procedure Laterality Date  . Partial hysterectomy    . Tube insert in right ear  2003  . Arthroscopy  left knee  08/14/2008    Dr. Romeo Apple    Family History  Problem Relation Age of Onset  . Diabetes Mother   . Hypertension Mother   . Cancer Father     left nephrectomy   . Hypertension Father   . Cancer Sister     breast cancer  . Hypertension Sister   . Hypertension Brother   . Diabetes Maternal Grandmother   . Hypertension Maternal Grandmother   . Hypertension Maternal Grandfather    History   Substance Use Topics  . Smoking status: Never Smoker   . Smokeless tobacco: Not on file  . Alcohol Use: No   OB History   Grav Para Term Preterm Abortions TAB SAB Ect Mult Living                 Review of Systems  Constitutional: Negative for activity change.       All ROS Neg except as noted in HPI  HENT: Positive for neck pain and neck stiffness. Negative for nosebleeds.   Eyes: Negative for photophobia and discharge.  Respiratory: Negative for cough, shortness of breath and wheezing.   Cardiovascular: Negative for chest pain and palpitations.  Gastrointestinal: Negative for abdominal pain and blood in stool.  Genitourinary: Negative for dysuria, frequency and hematuria.  Musculoskeletal: Positive for arthralgias. Negative for back pain.  Skin: Negative.   Neurological: Negative for dizziness, seizures, speech difficulty, weakness and numbness.  Psychiatric/Behavioral: Negative for hallucinations and confusion.       Depression    Allergies  Penicillins  Home Medications   Current Outpatient Rx  Name  Route  Sig  Dispense  Refill  . Bioflavonoid Products (BIOFLEX PO)   Oral   Take 1 tablet by mouth daily.         . diclofenac (VOLTAREN) 75 MG EC tablet   Oral  Take 1 tablet (75 mg total) by mouth 2 (two) times daily.   14 tablet   0   . Diphenhydramine-APAP, sleep, (GOODY PM PO)   Oral   Take 1 packet by mouth every 4 (four) hours as needed. Pain         . HYDROcodone-acetaminophen (NORCO) 7.5-325 MG per tablet   Oral   Take 1 tablet by mouth every 4 (four) hours as needed for pain.   20 tablet   0   . ibuprofen (ADVIL) 200 MG tablet   Oral   Take 400 mg by mouth every 6 (six) hours as needed. Pain         . ibuprofen (ADVIL,MOTRIN) 600 MG tablet   Oral   Take 1 tablet (600 mg total) by mouth every 8 (eight) hours as needed for pain.   15 tablet   0   . oxyCODONE-acetaminophen (PERCOCET/ROXICET) 5-325 MG per tablet   Oral   Take 1 tablet by  mouth every 4 (four) hours as needed for pain.   20 tablet   0    BP 153/87  Pulse 70  Temp(Src) 98.2 F (36.8 C) (Oral)  Resp 20  Ht 5\' 2"  (1.575 m)  Wt 272 lb (123.378 kg)  BMI 49.74 kg/m2  SpO2 100% Physical Exam  Nursing note and vitals reviewed. Constitutional: She is oriented to person, place, and time. She appears well-developed and well-nourished.  Non-toxic appearance.  HENT:  Head: Normocephalic.  Right Ear: Tympanic membrane and external ear normal.  Left Ear: Tympanic membrane and external ear normal.  Eyes: EOM and lids are normal. Pupils are equal, round, and reactive to light.  Neck: Neck supple. Carotid bruit is not present.  Cardiovascular: Normal rate, regular rhythm, normal heart sounds, intact distal pulses and normal pulses.   Pulmonary/Chest: Breath sounds normal. No respiratory distress.  Abdominal: Soft. Bowel sounds are normal. There is no tenderness. There is no guarding.  Musculoskeletal: Normal range of motion.  Pain to palpation of the left knee. No effusion. Mod crepitus noted on exam. No posterior mass. Distal pulse wnl.  Lymphadenopathy:       Head (right side): No submandibular adenopathy present.       Head (left side): No submandibular adenopathy present.    She has no cervical adenopathy.  Neurological: She is alert and oriented to person, place, and time. She has normal strength. No cranial nerve deficit or sensory deficit.  Skin: Skin is warm and dry.  Psychiatric: She has a normal mood and affect. Her speech is normal.    ED Course  Procedures (including critical care time) Labs Review Labs Reviewed - No data to display Imaging Review Dg Knee Complete 4 Views Left  10/23/2012   CLINICAL DATA:  Left knee pain following a fall yesterday.  EXAM: LEFT KNEE - COMPLETE 4+ VIEW  COMPARISON:  03/10/2008.  FINDINGS: Moderately large posterior patellar spurs with progression. Progressive medial joint space narrowing and spur formation with  interval lateral joint space narrowing and spur formation. No fracture, dislocation or effusion.  IMPRESSION: 1. No fracture or knee effusion. 2. Progressive degenerative changes.   Electronically Signed   By: Gordan Payment   On: 10/23/2012 20:42    MDM   1. Contusion, knee, left, initial encounter   2. Left knee DJD    **I have reviewed nursing notes, vital signs, and all appropriate lab and imaging results for this patient.*  Xray of the left knee is negative for  fracture or dislocation. Advanced DJD noted of the knee. Ice applied. Pt fitted with crutches. Rx for diclofenac and norco given to the patient. Pt to see Dr Romeo Apple for evaluation of the knee.  Kathie Dike, PA-C 10/23/12 2220

## 2012-12-26 ENCOUNTER — Encounter (HOSPITAL_COMMUNITY): Payer: Self-pay | Admitting: Emergency Medicine

## 2012-12-26 ENCOUNTER — Emergency Department (HOSPITAL_COMMUNITY): Payer: Medicare Other

## 2012-12-26 ENCOUNTER — Emergency Department (HOSPITAL_COMMUNITY)
Admission: EM | Admit: 2012-12-26 | Discharge: 2012-12-26 | Disposition: A | Discharge status: Home / Self Care | Payer: Medicare Other | Attending: Emergency Medicine | Admitting: Emergency Medicine

## 2012-12-26 DIAGNOSIS — Z8659 Personal history of other mental and behavioral disorders: Secondary | ICD-10-CM | POA: Insufficient documentation

## 2012-12-26 DIAGNOSIS — G8929 Other chronic pain: Secondary | ICD-10-CM | POA: Insufficient documentation

## 2012-12-26 DIAGNOSIS — Z8742 Personal history of other diseases of the female genital tract: Secondary | ICD-10-CM | POA: Insufficient documentation

## 2012-12-26 DIAGNOSIS — E669 Obesity, unspecified: Secondary | ICD-10-CM | POA: Insufficient documentation

## 2012-12-26 DIAGNOSIS — IMO0002 Reserved for concepts with insufficient information to code with codable children: Secondary | ICD-10-CM | POA: Insufficient documentation

## 2012-12-26 DIAGNOSIS — Z880 Allergy status to penicillin: Secondary | ICD-10-CM | POA: Insufficient documentation

## 2012-12-26 DIAGNOSIS — J45909 Unspecified asthma, uncomplicated: Secondary | ICD-10-CM

## 2012-12-26 DIAGNOSIS — M1710 Unilateral primary osteoarthritis, unspecified knee: Secondary | ICD-10-CM | POA: Insufficient documentation

## 2012-12-26 DIAGNOSIS — E785 Hyperlipidemia, unspecified: Secondary | ICD-10-CM | POA: Insufficient documentation

## 2012-12-26 DIAGNOSIS — Z79899 Other long term (current) drug therapy: Secondary | ICD-10-CM | POA: Insufficient documentation

## 2012-12-26 DIAGNOSIS — J45901 Unspecified asthma with (acute) exacerbation: Secondary | ICD-10-CM | POA: Insufficient documentation

## 2012-12-26 DIAGNOSIS — Z8669 Personal history of other diseases of the nervous system and sense organs: Secondary | ICD-10-CM | POA: Insufficient documentation

## 2012-12-26 MED ORDER — PREDNISONE 50 MG PO TABS
60.0000 mg | ORAL_TABLET | Freq: Once | ORAL | Status: AC
  Administered 2012-12-26: 60 mg via ORAL
  Filled 2012-12-26 (×2): qty 1

## 2012-12-26 MED ORDER — ALBUTEROL SULFATE (5 MG/ML) 0.5% IN NEBU
5.0000 mg | INHALATION_SOLUTION | Freq: Once | RESPIRATORY_TRACT | Status: AC
  Administered 2012-12-26: 5 mg via RESPIRATORY_TRACT
  Filled 2012-12-26: qty 1

## 2012-12-26 MED ORDER — PREDNISONE 20 MG PO TABS: ORAL_TABLET | ORAL | Status: DC

## 2012-12-26 MED ORDER — ACETAMINOPHEN 500 MG PO TABS
1000.0000 mg | ORAL_TABLET | Freq: Once | ORAL | Status: AC
  Administered 2012-12-26: 1000 mg via ORAL
  Filled 2012-12-26: qty 2

## 2012-12-26 MED ORDER — IPRATROPIUM BROMIDE 0.02 % IN SOLN
0.5000 mg | Freq: Once | RESPIRATORY_TRACT | Status: AC
  Administered 2012-12-26: 0.5 mg via RESPIRATORY_TRACT
  Filled 2012-12-26: qty 2.5

## 2012-12-26 MED ORDER — IBUPROFEN 800 MG PO TABS
800.0000 mg | ORAL_TABLET | Freq: Once | ORAL | Status: AC
  Administered 2012-12-26: 800 mg via ORAL
  Filled 2012-12-26: qty 1

## 2012-12-26 NOTE — ED Notes (Signed)
Pt is tearful, anxious, sob, non productive cough, complaining on chest pain with cough.

## 2012-12-26 NOTE — ED Notes (Signed)
Respiratory paged

## 2012-12-26 NOTE — ED Notes (Signed)
Patient given discharge instruction, verbalized understand. Patient ambulatory out of the department.  

## 2012-12-26 NOTE — ED Notes (Signed)
Off and coughing and congestion since last week.  Has asthma and states she can't breathe.  Inhalers  aren't working.

## 2012-12-26 NOTE — ED Provider Notes (Signed)
CSN: [id number]     Arrival date & time 12/26/12  1528 History  This chart was scribed for Donnetta Hutching, MD by Dorothey Baseman, ED Scribe. This patient was seen in room [facility]15[facility] and the patient's care was started at 3:56 PM.    Chief Complaint  Patient presents with  . Shortness of Breath   The history is provided by the patient. No language interpreter was used.   HPI Comments: Jessica Stevens is a 47 y.o. Female with a history of asthma who presents to the Emergency Department complaining of shortness of breath with associated wheezes, intermittent cough, congestion, and headache onset 1 week ago. Patient reports that she has been using her nebulizer machine and inhaler at home without relief. She states that she has taken steroids in the past with relief. She denies fever.   Past Medical History  Diagnosis Date  . HNP (herniated nucleus pulposus), cervical   . Complete rupture of rotator cuff   . Cervical radiculopathy   . Ankle pain, left   . Arthritis of knee, degenerative   . Knee pain   . Carpal tunnel syndrome   . Urinary incontinence   . Gynecomastia   . Asthma   . Depression   . OA (osteoarthritis) of knee   . Chronic back pain   . Obesity   . Hyperlipidemia   . HNP (herniated nucleus pulposus), cervical   . Complete rupture of rotator cuff    Past Surgical History  Procedure Laterality Date  . Partial hysterectomy    . Tube insert in right ear  2003  . Arthroscopy  left knee  08/14/2008    Dr. Romeo Apple    Family History  Problem Relation Age of Onset  . Diabetes Mother   . Hypertension Mother   . Cancer Father     left nephrectomy   . Hypertension Father   . Cancer Sister     breast cancer  . Hypertension Sister   . Hypertension Brother   . Diabetes Maternal Grandmother   . Hypertension Maternal Grandmother   . Hypertension Maternal Grandfather    History  Substance Use Topics  . Smoking status: Never Smoker   . Smokeless tobacco: Not on file  .  Alcohol Use: No   OB History   Grav Para Term Preterm Abortions TAB SAB Ect Mult Living                 Review of Systems  A complete 10 system review of systems was obtained and all systems are negative except as noted in the HPI and PMH.   Allergies  Penicillins  Home Medications   Current Outpatient Rx  Name  Route  Sig  Dispense  Refill  . Bioflavonoid Products (BIOFLEX PO)   Oral   Take 1 tablet by mouth daily.         . diclofenac (VOLTAREN) 75 MG EC tablet   Oral   Take 1 tablet (75 mg total) by mouth 2 (two) times daily.   14 tablet   0   . Diphenhydramine-APAP, sleep, (GOODY PM PO)   Oral   Take 1 packet by mouth every 4 (four) hours as needed. Pain         . HYDROcodone-acetaminophen (NORCO) 7.5-325 MG per tablet   Oral   Take 1 tablet by mouth every 4 (four) hours as needed for pain.   20 tablet   0   . ibuprofen (ADVIL) 200 MG tablet  Oral   Take 400 mg by mouth every 6 (six) hours as needed. Pain         . ibuprofen (ADVIL,MOTRIN) 600 MG tablet   Oral   Take 1 tablet (600 mg total) by mouth every 8 (eight) hours as needed for pain.   15 tablet   0   . oxyCODONE-acetaminophen (PERCOCET/ROXICET) 5-325 MG per tablet   Oral   Take 1 tablet by mouth every 4 (four) hours as needed for pain.   20 tablet   0    Triage Vitals: SpO2 96%  Physical Exam  Nursing note and vitals reviewed. Constitutional: She is oriented to person, place, and time. She appears well-developed and well-nourished.  Obese.   HENT:  Head: Normocephalic and atraumatic.  Eyes: Conjunctivae and EOM are normal. Pupils are equal, round, and reactive to light.  Neck: Normal range of motion. Neck supple.  Cardiovascular: Normal rate, regular rhythm and normal heart sounds.   Pulmonary/Chest: Effort normal. She has wheezes.  Inspiratory and expiratory wheezes. Slightly tachypneic.   Abdominal: Soft. Bowel sounds are normal.  Musculoskeletal: Normal range of motion.   Neurological: She is alert and oriented to person, place, and time.  Skin: Skin is warm and dry.  Psychiatric: She has a normal mood and affect.    ED Course  Procedures (including critical care time)  Medications  albuterol (PROVENTIL) (5 MG/ML) 0.5% nebulizer solution 5 mg (5 mg Nebulization Given 12/26/12 1617)  ipratropium (ATROVENT) nebulizer solution 0.5 mg (0.5 mg Nebulization Given 12/26/12 1617)  predniSONE (DELTASONE) tablet 60 mg (60 mg Oral Given 12/26/12 1627)  ibuprofen (ADVIL,MOTRIN) tablet 800 mg (800 mg Oral Given 12/26/12 1628)  acetaminophen (TYLENOL) tablet 1,000 mg (1,000 mg Oral Given 12/26/12 1628)   DIAGNOSTIC STUDIES: Oxygen Saturation is 96% on room air, normal by my interpretation.    COORDINATION OF CARE: 3:57 PM- Will order a chest x-ray and an Atrovent and albuterol breathing treatment. Discussed treatment plan with patient at bedside and patient verbalized agreement.     Labs Review Labs Reviewed - No data to display  Imaging Review Dg Chest 2 View  12/26/2012   CLINICAL DATA:  Shortness of breath. History of asthma.  EXAM: CHEST  2 VIEW  COMPARISON:  CHEST x-ray 03/06/2011.  FINDINGS: Lung volumes are normal. No consolidative airspace disease. No pleural effusions. No pneumothorax. No pulmonary nodule or mass noted. Pulmonary vasculature and the cardiomediastinal silhouette are within normal limits.  IMPRESSION: 1.  No radiographic evidence of acute cardiopulmonary disease.   Electronically Signed   By: Trudie Reed M.D.   On: 12/26/2012 16:57    EKG Interpretation   None       MDM  No diagnosis found. Patient feels much better after albuterol and Atrovent breathing treatment. Chest x-ray shows no pneumonia. Discharge medications prednisone. She has a nebulizer and inhaler at home  I personally performed the services described in this documentation, which was scribed in my presence. The recorded information has been reviewed and is  accurate.     Donnetta Hutching, MD 12/26/12 [phone]

## 2013-05-06 ENCOUNTER — Encounter (HOSPITAL_COMMUNITY): Payer: Self-pay | Admitting: Emergency Medicine

## 2013-05-06 ENCOUNTER — Emergency Department (HOSPITAL_COMMUNITY)
Admission: EM | Admit: 2013-05-06 | Discharge: 2013-05-06 | Disposition: A | Discharge status: Home / Self Care | Payer: Medicare Other | Attending: Emergency Medicine | Admitting: Emergency Medicine

## 2013-05-06 DIAGNOSIS — Z8639 Personal history of other endocrine, nutritional and metabolic disease: Secondary | ICD-10-CM | POA: Insufficient documentation

## 2013-05-06 DIAGNOSIS — Z8739 Personal history of other diseases of the musculoskeletal system and connective tissue: Secondary | ICD-10-CM | POA: Insufficient documentation

## 2013-05-06 DIAGNOSIS — Z8742 Personal history of other diseases of the female genital tract: Secondary | ICD-10-CM | POA: Insufficient documentation

## 2013-05-06 DIAGNOSIS — IMO0002 Reserved for concepts with insufficient information to code with codable children: Secondary | ICD-10-CM | POA: Insufficient documentation

## 2013-05-06 DIAGNOSIS — J45909 Unspecified asthma, uncomplicated: Secondary | ICD-10-CM | POA: Insufficient documentation

## 2013-05-06 DIAGNOSIS — K047 Periapical abscess without sinus: Secondary | ICD-10-CM

## 2013-05-06 DIAGNOSIS — Z8659 Personal history of other mental and behavioral disorders: Secondary | ICD-10-CM | POA: Insufficient documentation

## 2013-05-06 DIAGNOSIS — Z792 Long term (current) use of antibiotics: Secondary | ICD-10-CM | POA: Insufficient documentation

## 2013-05-06 DIAGNOSIS — Z880 Allergy status to penicillin: Secondary | ICD-10-CM | POA: Insufficient documentation

## 2013-05-06 DIAGNOSIS — Z862 Personal history of diseases of the blood and blood-forming organs and certain disorders involving the immune mechanism: Secondary | ICD-10-CM | POA: Insufficient documentation

## 2013-05-06 DIAGNOSIS — K044 Acute apical periodontitis of pulpal origin: Secondary | ICD-10-CM | POA: Insufficient documentation

## 2013-05-06 DIAGNOSIS — Z8669 Personal history of other diseases of the nervous system and sense organs: Secondary | ICD-10-CM | POA: Insufficient documentation

## 2013-05-06 DIAGNOSIS — E669 Obesity, unspecified: Secondary | ICD-10-CM | POA: Insufficient documentation

## 2013-05-06 DIAGNOSIS — G8929 Other chronic pain: Secondary | ICD-10-CM | POA: Insufficient documentation

## 2013-05-06 DIAGNOSIS — K029 Dental caries, unspecified: Secondary | ICD-10-CM | POA: Insufficient documentation

## 2013-05-06 MED ORDER — CLINDAMYCIN HCL 300 MG PO CAPS: 300.0000 mg | ORAL_CAPSULE | Freq: Four times a day (QID) | ORAL | Status: DC

## 2013-05-06 MED ORDER — TRAMADOL HCL 50 MG PO TABS
50.0000 mg | ORAL_TABLET | Freq: Once | ORAL | Status: AC
  Administered 2013-05-06: 50 mg via ORAL
  Filled 2013-05-06: qty 1

## 2013-05-06 MED ORDER — TRAMADOL HCL 50 MG PO TABS: 50.0000 mg | ORAL_TABLET | Freq: Four times a day (QID) | ORAL | Status: DC | PRN

## 2013-05-06 MED ORDER — CLINDAMYCIN HCL 150 MG PO CAPS
300.0000 mg | ORAL_CAPSULE | Freq: Once | ORAL | Status: AC
Start: 2013-05-06 — End: 2013-05-06
  Administered 2013-05-06: 300 mg via ORAL
  Filled 2013-05-06: qty 2

## 2013-05-06 NOTE — ED Notes (Signed)
Pt seen and evaluated by EDPa for initial assessment. 

## 2013-05-06 NOTE — Discharge Instructions (Signed)
Abscessed Tooth An abscessed tooth is an infection around your tooth. It may be caused by holes or damage to the tooth (cavity) or a dental disease. An abscessed tooth causes mild to very bad pain in and around the tooth. See your dentist right away if you have tooth or gum pain. HOME CARE  Take your medicine as told. Finish it even if you start to feel better.  Do not drive after taking pain medicine.  Rinse your mouth (gargle) often with salt water ( teaspoon salt in 8 ounces of warm water).  Do not apply heat to the outside of your face. GET HELP RIGHT AWAY IF:   You have a temperature by mouth above 102 F (38.9 C), not controlled by medicine.  You have chills and a very bad headache.  You have problems breathing or swallowing.  Your mouth will not open.  You develop puffiness (swelling) on the neck or around the eye.  Your pain is not helped by medicine.  Your pain is getting worse instead of better. MAKE SURE YOU:   Understand these instructions.  Will watch your condition.  Will get help right away if you are not doing well or get worse. Document Released: 08/02/2007 Document Revised: 05/08/2011 Document Reviewed: 05/24/2010 [facility] Patient Information 2014 [place].   Complete your entire course of antibiotics as prescribed.  You  may use the tramadol for pain relief but do not drive within 4 hours of taking as this will make you drowsy.  Avoid applying heat or ice to this abscess area which can worsen your symptoms.  You may use warm salt water swish and spit treatment or half peroxide and water swish and spit after meals to keep this area clean as discussed.  Call the dentist listed above for further management of your symptoms.

## 2013-05-06 NOTE — ED Notes (Signed)
Pt c/o bilateral jaw pain, states "I think it is my wisdom teeth" that started Friday,

## 2013-05-08 NOTE — ED Provider Notes (Signed)
CSN: [id number]     Arrival date & time 05/06/13  1613 History   First MD Initiated Contact with Patient 05/06/13 1713     Chief Complaint  Patient presents with  . Dental Pain     (Consider location/radiation/quality/duration/timing/severity/associated sxs/prior Treatment) HPI Comments: Jessica Stevens is a 48 y.o. Female presenting with a 4 day history of worsening right lower wisdom tooth pain with swelling of the gumline around this tooth. The patient denies a history of injury and/or decay in the tooth involved.  She has has had no fevers, chills, nausea or vomiting, also no complaint of difficulty swallowing, although chewing makes pain worse.  The patient has tried ibuprofen without relief of symptoms.         The history is provided by the patient.    Past Medical History  Diagnosis Date  . HNP (herniated nucleus pulposus), cervical   . Complete rupture of rotator cuff   . Cervical radiculopathy   . Ankle pain, left   . Arthritis of knee, degenerative   . Knee pain   . Carpal tunnel syndrome   . Urinary incontinence   . Gynecomastia   . Asthma   . Depression   . OA (osteoarthritis) of knee   . Chronic back pain   . Obesity   . Hyperlipidemia   . HNP (herniated nucleus pulposus), cervical   . Complete rupture of rotator cuff    Past Surgical History  Procedure Laterality Date  . Partial hysterectomy    . Tube insert in right ear  2003  . Arthroscopy  left knee  08/14/2008    Dr. Aline Brochure    Family History  Problem Relation Age of Onset  . Diabetes Mother   . Hypertension Mother   . Cancer Father     left nephrectomy   . Hypertension Father   . Cancer Sister     breast cancer  . Hypertension Sister   . Hypertension Brother   . Diabetes Maternal Grandmother   . Hypertension Maternal Grandmother   . Hypertension Maternal Grandfather    History  Substance Use Topics  . Smoking status: Never Smoker   . Smokeless tobacco: Not on file  . Alcohol  Use: No   OB History   Grav Para Term Preterm Abortions TAB SAB Ect Mult Living                 Review of Systems  Constitutional: Negative for fever.  HENT: Positive for dental problem. Negative for facial swelling and sore throat.   Respiratory: Negative for shortness of breath.   Musculoskeletal: Negative for neck pain and neck stiffness.      Allergies  Penicillins  Home Medications   Current Outpatient Rx  Name  Route  Sig  Dispense  Refill  . clindamycin (CLEOCIN) 300 MG capsule   Oral   Take 1 capsule (300 mg total) by mouth 4 (four) times daily.   28 capsule   0   . ibuprofen (ADVIL) 200 MG tablet   Oral   Take 400 mg by mouth every 6 (six) hours as needed. Pain         . predniSONE (DELTASONE) 20 MG tablet      3 tabs po day one, then 2 po daily x 4 days   11 tablet   0   . traMADol (ULTRAM) 50 MG tablet   Oral   Take 1 tablet (50 mg total) by mouth every 6 (six) hours  as needed for moderate pain.   20 tablet   0    BP 159/103  Pulse 78  Temp(Src) 98.1 F (36.7 C) (Oral)  Resp 25  Ht 5' 4.5" (1.638 m)  Wt 289 lb (131.09 kg)  BMI 48.86 kg/m2  SpO2 95% Physical Exam  Constitutional: She is oriented to person, place, and time. She appears well-developed and well-nourished. No distress.  HENT:  Head: Normocephalic and atraumatic.  Right Ear: Tympanic membrane and external ear normal.  Left Ear: Tympanic membrane and external ear normal.  Mouth/Throat: Oropharynx is clear and moist and mucous membranes are normal. No oral lesions. No trismus in the jaw.    Several areas of dental decay,  Several extracted teeth.  Eyes: Conjunctivae are normal.  Neck: Normal range of motion. Neck supple.  Cardiovascular: Normal rate and normal heart sounds.   Pulmonary/Chest: Effort normal.  Abdominal: She exhibits no distension.  Musculoskeletal: Normal range of motion.  Lymphadenopathy:    She has no cervical adenopathy.  Neurological: She is alert and  oriented to person, place, and time.  Skin: Skin is warm and dry. No erythema.  Psychiatric: She has a normal mood and affect.    ED Course  Procedures (including critical care time) Labs Review Labs Reviewed - No data to display Imaging Review No results found.   EKG Interpretation None      MDM   Final diagnoses:  Dental infection    Pt placed on clindamycin as she is pcn allergic.  Tramadol prescribed.  Dental referrals given for definitive tx of this tooth.  The patient appears reasonably screened and/or stabilized for discharge and I doubt any other medical condition or other [facility] requiring further screening, evaluation, or treatment in the ED at this time prior to discharge.     Evalee Jefferson, PA-C 05/08/13 0131

## 2013-05-11 NOTE — ED Provider Notes (Signed)
Medical screening examination/treatment/procedure(s) were performed by non-physician practitioner and as supervising physician I was immediately available for consultation/collaboration.   EKG Interpretation None       Virgel Manifold, MD 05/11/13 [phone]

## 2013-12-02 ENCOUNTER — Encounter (INDEPENDENT_AMBULATORY_CARE_PROVIDER_SITE_OTHER): Payer: Self-pay | Admitting: *Deleted

## 2013-12-30 ENCOUNTER — Other Ambulatory Visit (INDEPENDENT_AMBULATORY_CARE_PROVIDER_SITE_OTHER): Payer: Self-pay | Admitting: *Deleted

## 2013-12-30 ENCOUNTER — Ambulatory Visit (INDEPENDENT_AMBULATORY_CARE_PROVIDER_SITE_OTHER): Payer: Medicare Other | Admitting: Internal Medicine

## 2013-12-30 ENCOUNTER — Telehealth (INDEPENDENT_AMBULATORY_CARE_PROVIDER_SITE_OTHER): Payer: Self-pay | Admitting: *Deleted

## 2013-12-30 ENCOUNTER — Encounter (INDEPENDENT_AMBULATORY_CARE_PROVIDER_SITE_OTHER): Payer: Self-pay | Admitting: Internal Medicine

## 2013-12-30 VITALS — BP 158/86 | HR 68 | Temp 97.5°F | Ht 65.0 in | Wt 266.4 lb

## 2013-12-30 DIAGNOSIS — Z1211 Encounter for screening for malignant neoplasm of colon: Secondary | ICD-10-CM

## 2013-12-30 DIAGNOSIS — R195 Other fecal abnormalities: Secondary | ICD-10-CM

## 2013-12-30 MED ORDER — PEG-KCL-NACL-NASULF-NA ASC-C 100 G PO SOLR: 1.0000 | Freq: Once | ORAL | Status: DC

## 2013-12-30 NOTE — Patient Instructions (Signed)
Colonoscopy.  The risks and benefits such as perforation, bleeding, and infection were reviewed with the patient and is agreeable. 

## 2013-12-30 NOTE — Telephone Encounter (Signed)
Patient needs moiv prep 

## 2013-12-30 NOTE — Progress Notes (Signed)
   Subjective:    Patient ID: Jessica Stevens, female    DOB: 01-23-1966, 48 y.o.   MRN: [id number]  HPI Referred to our office by Dr. Wende Neighbors for positive stool card. She denies seeing any [facility] or melena. No family hx of colon cancer. Sister deceased with breast cancer at age 7.   No weight loss. Appetite good. BM x 1 every 2-3 days. She will eat cheese at time to make her have a BM. Has been on Diclofenac x 2 week back in September for pain.   Single, one child in good health. Disabled.  Review of Systems Past Medical History  Diagnosis Date  . HNP (herniated nucleus pulposus), cervical   . Complete rupture of rotator cuff   . Cervical radiculopathy   . Ankle pain, left   . Arthritis of knee, degenerative   . Knee pain   . Carpal tunnel syndrome   . Urinary incontinence   . Gynecomastia   . Asthma   . Depression   . OA (osteoarthritis) of knee   . Chronic back pain   . Obesity   . Hyperlipidemia   . HNP (herniated nucleus pulposus), cervical   . Complete rupture of rotator cuff     Past Surgical History  Procedure Laterality Date  . Partial hysterectomy    . Tube insert in right ear  2003  . Arthroscopy  left knee  08/14/2008    Dr. Aline Brochure     Allergies  Allergen Reactions  . Penicillins Other (See Comments)    According to Allergy Test.  Patient states she is not allergic to this medication    Current Outpatient Prescriptions on File Prior to Visit  Medication Sig Dispense Refill  . clindamycin (CLEOCIN) 300 MG capsule Take 1 capsule (300 mg total) by mouth 4 (four) times daily. 28 capsule 0  . ibuprofen (ADVIL) 200 MG tablet Take 400 mg by mouth every 6 (six) hours as needed. Pain    . predniSONE (DELTASONE) 20 MG tablet 3 tabs po day one, then 2 po daily x 4 days 11 tablet 0  . traMADol (ULTRAM) 50 MG tablet Take 1 tablet (50 mg total) by mouth every 6 (six) hours as needed for moderate pain. 20 tablet 0   No current facility-administered medications  on file prior to visit.        Objective:   Physical Exam Filed Vitals:   12/30/13 0953  Height: 5\' 5"  (1.651 m)  Weight: 266 lb 6.4 oz (120.838 kg)   Alert and oriented. Skin warm and dry. Oral mucosa is moist.   . Sclera anicteric, conjunctivae is pink. Thyroid not enlarged. No cervical lymphadenopathy. Lungs clear. Heart regular rate and rhythm.  Abdomen is soft. Bowel sounds are positive. No hepatomegaly. No abdominal masses felt. No tenderness.  No edema to lower extremities. Stool brown and guaiac negative.       Assessment & Plan:  Guaiac positive stool. Colonic neoplasm, polyp, AVM, hemorrhoid needs to be ruled out.

## 2014-01-06 ENCOUNTER — Encounter (INDEPENDENT_AMBULATORY_CARE_PROVIDER_SITE_OTHER): Payer: Self-pay | Admitting: *Deleted

## 2014-01-27 ENCOUNTER — Encounter (INDEPENDENT_AMBULATORY_CARE_PROVIDER_SITE_OTHER): Payer: Self-pay | Admitting: *Deleted

## 2014-03-11 ENCOUNTER — Ambulatory Visit (HOSPITAL_COMMUNITY)
Admission: RE | Admit: 2014-03-11 | Discharge: 2014-03-11 | Disposition: A | Discharge status: Home / Self Care | Payer: Medicare Other | Source: Ambulatory Visit | Attending: Internal Medicine | Admitting: Internal Medicine

## 2014-03-11 ENCOUNTER — Encounter (HOSPITAL_COMMUNITY): Payer: Self-pay | Admitting: *Deleted

## 2014-03-11 ENCOUNTER — Encounter (HOSPITAL_COMMUNITY)
Admission: RE | Disposition: A | Discharge status: Home / Self Care | Payer: Self-pay | Source: Ambulatory Visit | Attending: Internal Medicine

## 2014-03-11 DIAGNOSIS — K649 Unspecified hemorrhoids: Secondary | ICD-10-CM

## 2014-03-11 DIAGNOSIS — J45909 Unspecified asthma, uncomplicated: Secondary | ICD-10-CM | POA: Insufficient documentation

## 2014-03-11 DIAGNOSIS — G8929 Other chronic pain: Secondary | ICD-10-CM | POA: Diagnosis not present

## 2014-03-11 DIAGNOSIS — F329 Major depressive disorder, single episode, unspecified: Secondary | ICD-10-CM | POA: Insufficient documentation

## 2014-03-11 DIAGNOSIS — Z1211 Encounter for screening for malignant neoplasm of colon: Secondary | ICD-10-CM | POA: Diagnosis present

## 2014-03-11 DIAGNOSIS — R195 Other fecal abnormalities: Secondary | ICD-10-CM

## 2014-03-11 DIAGNOSIS — M549 Dorsalgia, unspecified: Secondary | ICD-10-CM | POA: Insufficient documentation

## 2014-03-11 DIAGNOSIS — D179 Benign lipomatous neoplasm, unspecified: Secondary | ICD-10-CM | POA: Insufficient documentation

## 2014-03-11 DIAGNOSIS — D649 Anemia, unspecified: Secondary | ICD-10-CM | POA: Insufficient documentation

## 2014-03-11 DIAGNOSIS — K5730 Diverticulosis of large intestine without perforation or abscess without bleeding: Secondary | ICD-10-CM | POA: Insufficient documentation

## 2014-03-11 DIAGNOSIS — E785 Hyperlipidemia, unspecified: Secondary | ICD-10-CM | POA: Diagnosis not present

## 2014-03-11 DIAGNOSIS — K644 Residual hemorrhoidal skin tags: Secondary | ICD-10-CM | POA: Diagnosis not present

## 2014-03-11 DIAGNOSIS — E669 Obesity, unspecified: Secondary | ICD-10-CM | POA: Diagnosis not present

## 2014-03-11 DIAGNOSIS — Z6841 Body Mass Index (BMI) 40.0 and over, adult: Secondary | ICD-10-CM | POA: Insufficient documentation

## 2014-03-11 DIAGNOSIS — M179 Osteoarthritis of knee, unspecified: Secondary | ICD-10-CM | POA: Diagnosis not present

## 2014-03-11 DIAGNOSIS — Z79899 Other long term (current) drug therapy: Secondary | ICD-10-CM | POA: Diagnosis not present

## 2014-03-11 HISTORY — PX: COLONOSCOPY: SHX5424

## 2014-03-11 SURGERY — COLONOSCOPY
Anesthesia: Moderate Sedation

## 2014-03-11 MED ORDER — SODIUM CHLORIDE 0.9 % IV SOLN
INTRAVENOUS | Status: DC
Start: 2014-03-11 — End: 2014-03-11
  Administered 2014-03-11: 1000 mL via INTRAVENOUS

## 2014-03-11 MED ORDER — MEPERIDINE HCL 50 MG/ML IJ SOLN
INTRAMUSCULAR | Status: DC
Start: 2014-03-11 — End: 2014-03-11
  Filled 2014-03-11: qty 1

## 2014-03-11 MED ORDER — MEPERIDINE HCL 50 MG/ML IJ SOLN
INTRAMUSCULAR | Status: DC | PRN
  Administered 2014-03-11 (×2): 25 mg via INTRAVENOUS

## 2014-03-11 MED ORDER — MIDAZOLAM HCL 5 MG/5ML IJ SOLN
INTRAMUSCULAR | Status: AC
  Filled 2014-03-11: qty 10

## 2014-03-11 MED ORDER — MIDAZOLAM HCL 5 MG/5ML IJ SOLN
INTRAMUSCULAR | Status: DC | PRN
  Administered 2014-03-11 (×5): 2 mg via INTRAVENOUS

## 2014-03-11 MED ORDER — STERILE WATER FOR IRRIGATION IR SOLN
Status: DC | PRN
  Administered 2014-03-11: 11:00:00

## 2014-03-11 NOTE — Op Note (Signed)
COLONOSCOPY PROCEDURE REPORT  PATIENT:  Jessica Stevens  MR#:  [id number] Birthdate:  Jun 13, 1965, 49 y.o., female Endoscopist:  Dr. Rogene Houston, MD Referred By:  Dr. Delphina Cahill, MD Procedure Date: 03/11/2014  Procedure:   Colonoscopy  Indications: Patient is 49 year old African-American female who was recently found to have heme-positive stool. She denies abdominal pain change in bowel habits rectal bleeding or melena. She has chronic back pain and arthritis and takes Advil on when necessary basis. She does not take it every day. She states heartburns well controlled with PPI. We checked her H&H was 10.4 and 31.9. Family history is negative for CRC.  Informed Consent:  The procedure and risks were reviewed with the patient and informed consent was obtained.  Medications:  Demerol 50 mg IV Versed 10 mg IV  Description of procedure:  After a digital rectal exam was performed, that colonoscope was advanced from the anus through the rectum and colon to the area of the cecum, ileocecal valve and appendiceal orifice. The cecum was deeply intubated. These structures were well-seen and photographed for the record. From the level of the cecum and ileocecal valve, the scope was slowly and cautiously withdrawn. The mucosal surfaces were carefully surveyed utilizing scope tip to flexion to facilitate fold flattening as needed. The scope was pulled down into the rectum where a thorough exam including retroflexion was performed. Terminal ileum was also examined.  Findings:   Prep excellent. Mucosa of terminal ileum was normal. Single diverticulum at hepatic flexure along with one more at sigmoid colon. 6 mm submucosal lipoma noted at splenic flexure. Normal rectal mucosa. Small hemorrhoids below the dentate line.    Therapeutic/Diagnostic Maneuvers Performed:   None  Complications:  None  Cecal Withdrawal Time:  10 minutes  Impression:  Normal mucosa of terminal ileum. Single  diverticulum at hepatic flexure along with one at sigmoid colon. Small lipoma at splenic flexure. External hemorrhoids.  Recommendations:  Standard instructions given. Patient advised to keep NSAID used a minimum. She will call if she has melena or rectal bleeding. Will check H&H and Hemoccult in one month.  Tuere Nwosu U  03/11/2014 12:06 PM  CC: Dr. Delphina Cahill, MD & Dr. Rayne Du ref. provider found

## 2014-03-11 NOTE — H&P (Addendum)
Jessica Stevens is an 49 y.o. female.   Chief Complaint: Patient is here for colonoscopy. HPI: Patient is 49 year old African-American female was noted to have heme-positive stool on routine exam. She was therefore referred for diagnostic colonoscopy. She denies abdominal pain change in bowel habits rectal bleeding or melena. Her bowels generally move every third day which is her usual pattern. She also denies nausea vomiting or dysphagia. She says heartburn is well controlled with therapy. She has back problems as well as arthritis and uses Advil on as-needed basis. On some days she may take 2 tablets twice daily and on most days she may take none or 2. Family history is negative for CRC. She had CBC on 03/06/2011. H&H was 10.4 and 31.9 and MCV 84.8.  Past Medical History  Diagnosis Date  . HNP (herniated nucleus pulposus), cervical   . Complete rupture of rotator cuff   . Cervical radiculopathy   . Ankle pain, left   . Arthritis of knee, degenerative   . Knee pain   . Carpal tunnel syndrome   . Urinary incontinence   . Gynecomastia   . Asthma   . Depression   . OA (osteoarthritis) of knee   . Chronic back pain   . Obesity   . Hyperlipidemia   . HNP (herniated nucleus pulposus), cervical   . Complete rupture of rotator cuff     Past Surgical History  Procedure Laterality Date  . Partial hysterectomy    . Tube insert in right ear  2003  . Arthroscopy  left knee  08/14/2008    Dr. Aline Brochure     Family History  Problem Relation Age of Onset  . Diabetes Mother   . Hypertension Mother   . Cancer Father     left nephrectomy   . Hypertension Father   . Hypertension Sister   . Diabetes Maternal Grandmother   . Hypertension Maternal Grandmother   . Hypertension Maternal Grandfather    Social History:  reports that she has never smoked. She does not have any smokeless tobacco history on file. She reports that she does not drink alcohol or use illicit drugs.  Allergies:   Allergies  Allergen Reactions  . Penicillins Other (See Comments)    According to Allergy Test.  Patient states she is not allergic to this medication    Medications Prior to Admission  Medication Sig Dispense Refill  . albuterol (PROVENTIL HFA;VENTOLIN HFA) 108 (90 BASE) MCG/ACT inhaler Inhale into the lungs every 6 (six) hours as needed for wheezing or shortness of breath.    Marland Kitchen ibuprofen (ADVIL) 200 MG tablet Take 400 mg by mouth every 6 (six) hours as needed. Pain    . methocarbamol (ROBAXIN) 500 MG tablet Take 500 mg by mouth 4 (four) times daily.    . montelukast (SINGULAIR) 10 MG tablet Take 10 mg by mouth at bedtime.    . pantoprazole (PROTONIX) 40 MG tablet Take 40 mg by mouth daily.    . peg 3350 powder (MOVIPREP) 100 G SOLR Take 1 kit (200 g total) by mouth once. 1 kit 0  . acetaminophen (TYLENOL) 500 MG tablet Take 500 mg by mouth every 6 (six) hours as needed.    . predniSONE (DELTASONE) 20 MG tablet 3 tabs po day one, then 2 po daily x 4 days 11 tablet 0  . traMADol (ULTRAM) 50 MG tablet Take 1 tablet (50 mg total) by mouth every 6 (six) hours as needed for moderate pain. 20 tablet  0    No results found for this or any previous visit (from the past 48 hour(s)). No results found.  ROS  Blood pressure 127/84, pulse 95, temperature 98.2 F (36.8 C), temperature source Oral, resp. rate 22, height 5' 2"  (1.575 m), weight 260 lb (117.935 kg), SpO2 95 %. Physical Exam  Constitutional:  Well-developed obese African-American female in NAD  HENT:  Mouth/Throat: Oropharynx is clear and moist.  Eyes: Conjunctivae are normal. No scleral icterus.  Neck: No tracheal deviation present. No thyromegaly present.  Cardiovascular: Normal rate, regular rhythm and normal heart sounds.   No murmur heard. Respiratory: Effort normal and breath sounds normal.  GI:  Abdomen is protuberant. Is soft and nontender without organomegaly or masses.  Musculoskeletal: She exhibits no edema.   Lymphadenopathy:    She has no cervical adenopathy.  Neurological: She is alert.  Skin: Skin is warm and dry.     Assessment/Plan Heme positive stool. Anemia with normal MCV. Diagnostic colonoscopy  REHMAN,NAJEEB U 03/11/2014, 11:27 AM

## 2014-03-11 NOTE — Discharge Instructions (Signed)
Resume usual medications but keep Advil used a minimum.  Resume usual diet. Notify if you have rectal bleeding or tarry stools. H&H and Hemoccult in one month. Office will call. No driving for 24 hours.   Colonoscopy, Care After These instructions give you information on caring for yourself after your procedure. Your doctor may also give you more specific instructions. Call your doctor if you have any problems or questions after your procedure. HOME CARE  Do not drive for 24 hours.  Do not sign important papers or use machinery for 24 hours.  You may shower.  You may go back to your usual activities, but go slower for the first 24 hours.  Take rest breaks often during the first 24 hours.  Walk around or use warm packs on your belly (abdomen) if you have belly cramping or gas.  Drink enough fluids to keep your pee (urine) clear or pale yellow.  Resume your normal diet. Avoid heavy or fried foods.  Avoid drinking alcohol for 24 hours or as told by your doctor.  Only take medicines as told by your doctor. If a tissue sample (biopsy) was taken during the procedure:   Do not take aspirin or blood thinners for 7 days, or as told by your doctor.  Do not drink alcohol for 7 days, or as told by your doctor.  Eat soft foods for the first 24 hours. GET HELP IF: You still have a small amount of blood in your poop (stool) 2-3 days after the procedure. GET HELP RIGHT AWAY IF:  You have more than a small amount of blood in your poop.  You see clumps of tissue (blood clots) in your poop.  Your belly is puffy (swollen).  You feel sick to your stomach (nauseous) or throw up (vomit).  You have a fever.  You have belly pain that gets worse and medicine does not help. MAKE SURE YOU:  Understand these instructions.  Will watch your condition.  Will get help right away if you are not doing well or get worse. Document Released: 03/18/2010 Document Revised: 02/18/2013 Document  Reviewed: 10/21/2012 [facility] Patient Information 2015 [place], [place]. This information is not intended to replace advice given to you by your health care provider. Make sure you discuss any questions you have with your health care provider.

## 2014-03-12 ENCOUNTER — Encounter (HOSPITAL_COMMUNITY): Payer: Self-pay | Admitting: Internal Medicine

## 2014-04-30 DIAGNOSIS — J069 Acute upper respiratory infection, unspecified: Secondary | ICD-10-CM | POA: Diagnosis not present

## 2014-04-30 DIAGNOSIS — J45909 Unspecified asthma, uncomplicated: Secondary | ICD-10-CM | POA: Diagnosis not present

## 2014-04-30 DIAGNOSIS — M542 Cervicalgia: Secondary | ICD-10-CM | POA: Diagnosis not present

## 2014-06-02 DIAGNOSIS — R7301 Impaired fasting glucose: Secondary | ICD-10-CM | POA: Diagnosis not present

## 2014-06-02 DIAGNOSIS — I10 Essential (primary) hypertension: Secondary | ICD-10-CM | POA: Diagnosis not present

## 2014-06-03 DIAGNOSIS — G629 Polyneuropathy, unspecified: Secondary | ICD-10-CM | POA: Diagnosis not present

## 2014-06-03 DIAGNOSIS — R7301 Impaired fasting glucose: Secondary | ICD-10-CM | POA: Diagnosis not present

## 2014-06-03 DIAGNOSIS — K219 Gastro-esophageal reflux disease without esophagitis: Secondary | ICD-10-CM | POA: Diagnosis not present

## 2014-06-03 DIAGNOSIS — F419 Anxiety disorder, unspecified: Secondary | ICD-10-CM | POA: Diagnosis not present

## 2014-08-20 ENCOUNTER — Emergency Department (HOSPITAL_COMMUNITY)
Admission: EM | Admit: 2014-08-20 | Discharge: 2014-08-20 | Disposition: A | Discharge status: Home / Self Care | Payer: Medicare Other | Attending: Emergency Medicine | Admitting: Emergency Medicine

## 2014-08-20 ENCOUNTER — Encounter (HOSPITAL_COMMUNITY): Payer: Self-pay | Admitting: Emergency Medicine

## 2014-08-20 DIAGNOSIS — G8929 Other chronic pain: Secondary | ICD-10-CM | POA: Insufficient documentation

## 2014-08-20 DIAGNOSIS — Z79899 Other long term (current) drug therapy: Secondary | ICD-10-CM | POA: Diagnosis not present

## 2014-08-20 DIAGNOSIS — M79629 Pain in unspecified upper arm: Secondary | ICD-10-CM | POA: Diagnosis present

## 2014-08-20 DIAGNOSIS — E669 Obesity, unspecified: Secondary | ICD-10-CM | POA: Insufficient documentation

## 2014-08-20 DIAGNOSIS — Z8742 Personal history of other diseases of the female genital tract: Secondary | ICD-10-CM | POA: Diagnosis not present

## 2014-08-20 DIAGNOSIS — Z880 Allergy status to penicillin: Secondary | ICD-10-CM | POA: Insufficient documentation

## 2014-08-20 DIAGNOSIS — M542 Cervicalgia: Secondary | ICD-10-CM | POA: Diagnosis not present

## 2014-08-20 DIAGNOSIS — J45909 Unspecified asthma, uncomplicated: Secondary | ICD-10-CM | POA: Diagnosis not present

## 2014-08-20 DIAGNOSIS — Z8659 Personal history of other mental and behavioral disorders: Secondary | ICD-10-CM | POA: Insufficient documentation

## 2014-08-20 MED ORDER — HYDROCODONE-ACETAMINOPHEN 5-325 MG PO TABS: 1.0000 | ORAL_TABLET | ORAL | Status: DC | PRN

## 2014-08-20 NOTE — Discharge Instructions (Signed)
Continue your robaxin as discussed Cervical Sprain A cervical sprain is an injury in the neck in which the strong, fibrous tissues (ligaments) that connect your neck bones stretch or tear. Cervical sprains can range from mild to severe. Severe cervical sprains can cause the neck vertebrae to be unstable. This can lead to damage of the spinal cord and can result in serious nervous system problems. The amount of time it takes for a cervical sprain to get better depends on the cause and extent of the injury. Most cervical sprains heal in 1 to 3 weeks. CAUSES  Severe cervical sprains may be caused by:   Contact sport injuries (such as from football, rugby, wrestling, hockey, auto racing, gymnastics, diving, martial arts, or boxing).   Motor vehicle collisions.   Whiplash injuries. This is an injury from a sudden forward and backward whipping movement of the head and neck.  Falls.  Mild cervical sprains may be caused by:   Being in an awkward position, such as while cradling a telephone between your ear and shoulder.   Sitting in a chair that does not offer proper support.   Working at a poorly Landscape [employer] station.   Looking up or down for long periods of time.  SYMPTOMS   Pain, soreness, stiffness, or a burning sensation in the front, back, or sides of the neck. This discomfort may develop immediately after the injury or slowly, 24 hours or more after the injury.   Pain or tenderness directly in the middle of the back of the neck.   Shoulder or upper back pain.   Limited ability to move the neck.   Headache.   Dizziness.   Weakness, numbness, or tingling in the hands or arms.   Muscle spasms.   Difficulty swallowing or chewing.   Tenderness and swelling of the neck.  DIAGNOSIS  Most of the time your health care provider can diagnose a cervical sprain by taking your history and doing a physical exam. Your health care provider will ask about previous neck  injuries and any known neck problems, such as arthritis in the neck. X-rays may be taken to find out if there are any other problems, such as with the bones of the neck. Other tests, such as a CT scan or MRI, may also be needed.  TREATMENT  Treatment depends on the severity of the cervical sprain. Mild sprains can be treated with rest, keeping the neck in place (immobilization), and pain medicines. Severe cervical sprains are immediately immobilized. Further treatment is done to help with pain, muscle spasms, and other symptoms and may include:  Medicines, such as pain relievers, numbing medicines, or muscle relaxants.   Physical therapy. This may involve stretching exercises, strengthening exercises, and posture training. Exercises and improved posture can help stabilize the neck, strengthen muscles, and help stop symptoms from returning.  HOME CARE INSTRUCTIONS   Put ice on the injured area.   Put ice in a plastic bag.   Place a towel between your skin and the bag.   Leave the ice on for 15-20 minutes, 3-4 times a day.   If your injury was severe, you may have been given a cervical collar to wear. A cervical collar is a two-piece collar designed to keep your neck from moving while it heals.  Do not remove the collar unless instructed by your health care provider.  If you have long hair, keep it outside of the collar.  Ask your health care provider before making any  adjustments to your collar. Minor adjustments may be required over time to improve comfort and reduce pressure on your chin or on the back of your head.  Ifyou are allowed to remove the collar for cleaning or bathing, follow your health care provider's instructions on how to do so safely.  Keep your collar clean by wiping it with mild soap and water and drying it completely. If the collar you have been given includes removable pads, remove them every 1-2 days and hand wash them with soap and water. Allow them to air  dry. They should be completely dry before you wear them in the collar.  If you are allowed to remove the collar for cleaning and bathing, wash and dry the skin of your neck. Check your skin for irritation or sores. If you see any, tell your health care provider.  Do not drive while wearing the collar.   Only take over-the-counter or prescription medicines for pain, discomfort, or fever as directed by your health care provider.   Keep all follow-up appointments as directed by your health care provider.   Keep all physical therapy appointments as directed by your health care provider.   Make any needed adjustments to your workstation to promote good posture.   Avoid positions and activities that make your symptoms worse.   Warm up and stretch before being active to help prevent problems.  SEEK MEDICAL CARE IF:   Your pain is not controlled with medicine.   You are unable to decrease your pain medicine over time as planned.   Your activity level is not improving as expected.  SEEK IMMEDIATE MEDICAL CARE IF:   You develop any bleeding.  You develop stomach upset.  You have signs of an allergic reaction to your medicine.   Your symptoms get worse.   You develop new, unexplained symptoms.   You have numbness, tingling, weakness, or paralysis in any part of your body.  MAKE SURE YOU:   Understand these instructions.  Will watch your condition.  Will get help right away if you are not doing well or get worse. Document Released: 12/11/2006 Document Revised: 02/18/2013 Document Reviewed: 08/21/2012 [facility] Patient Information 2015 [place], [place]. This information is not intended to replace advice given to you by your health care provider. Make sure you discuss any questions you have with your health care provider.

## 2014-08-20 NOTE — ED Provider Notes (Signed)
CSN: [id number]     Arrival date & time 08/20/14  1157 History   First MD Initiated Contact with Patient 08/20/14 1203     Chief Complaint  Patient presents with  . Arm Pain     (Consider location/radiation/quality/duration/timing/severity/associated sxs/prior Treatment) HPI Comments: Pt comes in with c/o pain on the right neck that radiates down her arm. She states that she thinks she may have been bit by something. She has pain with movement and she states that her robaxin isn't helping. Denies numbness or weakness. No recent injury. She does state that she had chronic neck pain. No chest pain  Or sob.  The history is provided by the patient. No language interpreter was used.    Past Medical History  Diagnosis Date  . HNP (herniated nucleus pulposus), cervical   . Complete rupture of rotator cuff   . Cervical radiculopathy   . Ankle pain, left   . Arthritis of knee, degenerative   . Knee pain   . Carpal tunnel syndrome   . Urinary incontinence   . Gynecomastia   . Asthma   . Depression   . OA (osteoarthritis) of knee   . Chronic back pain   . Obesity   . Hyperlipidemia   . HNP (herniated nucleus pulposus), cervical   . Complete rupture of rotator cuff    Past Surgical History  Procedure Laterality Date  . Partial hysterectomy    . Tube insert in right ear  2003  . Arthroscopy  left knee  08/14/2008    Dr. Aline Brochure   . Colonoscopy N/A 03/11/2014    Procedure: COLONOSCOPY;  Surgeon: Rogene Houston, MD;  Location: [facility];  Service: Endoscopy;  Laterality: N/A;  730 - moved to 1/13 @ 12:00   Family History  Problem Relation Age of Onset  . Diabetes Mother   . Hypertension Mother   . Cancer Father     left nephrectomy   . Hypertension Father   . Hypertension Sister   . Diabetes Maternal Grandmother   . Hypertension Maternal Grandmother   . Hypertension Maternal Grandfather    History  Substance Use Topics  . Smoking status: Never Smoker   . Smokeless  tobacco: Not on file  . Alcohol Use: No   OB History    No data available     Review of Systems  All other systems reviewed and are negative.     Allergies  Penicillins  Home Medications   Prior to Admission medications   Medication Sig Start Date End Date Taking? Authorizing Provider  acetaminophen (TYLENOL) 500 MG tablet Take 500 mg by mouth every 6 (six) hours as needed.    Historical Provider, MD  albuterol (PROVENTIL HFA;VENTOLIN HFA) 108 (90 BASE) MCG/ACT inhaler Inhale into the lungs every 6 (six) hours as needed for wheezing or shortness of breath.    Historical Provider, MD  HYDROcodone-acetaminophen (NORCO/VICODIN) 5-325 MG per tablet Take 1-2 tablets by mouth every 4 (four) hours as needed. 08/20/14   Glendell Docker, NP  ibuprofen (ADVIL) 200 MG tablet Take 400 mg by mouth every 6 (six) hours as needed. Pain    Historical Provider, MD  methocarbamol (ROBAXIN) 500 MG tablet Take 500 mg by mouth 4 (four) times daily.    Historical Provider, MD  montelukast (SINGULAIR) 10 MG tablet Take 10 mg by mouth at bedtime.    Historical Provider, MD  pantoprazole (PROTONIX) 40 MG tablet Take 40 mg by mouth daily.  Historical Provider, MD   BP 144/90 mmHg  Pulse 65  Temp(Src) 98 F (36.7 C) (Oral)  Resp 18  Ht 5\' 4"  (1.626 m)  Wt 240 lb (108.863 kg)  BMI 41.18 kg/m2  SpO2 95% Physical Exam  Constitutional: She is oriented to person, place, and time. She appears well-developed and well-nourished.  Cardiovascular: Normal rate and regular rhythm.   Pulmonary/Chest: Effort normal and breath sounds normal.  Musculoskeletal:  Right cervical paraspinal tenderness. Grip strength equal  Neurological: She is alert and oriented to person, place, and time. She exhibits normal muscle tone. Coordination normal.  Skin: Skin is warm and dry.  Nursing note and vitals reviewed.   ED Course  Procedures (including critical care time) Labs Review Labs Reviewed - No data to  display  Imaging Review No results found.   EKG Interpretation None      MDM   Final diagnoses:  Neck pain    Likely inflammation of chronic pain. Pt is neurologically intact. Discussed follow up with pcp for continued symptoms    Glendell Docker, NP 08/20/14 1246  Virgel Manifold, MD 08/20/14 1344

## 2014-08-20 NOTE — ED Notes (Signed)
Pt c/o insect bit to right shoulder and pain right neck and arm x 3 days.

## 2014-08-20 NOTE — ED Notes (Signed)
Pt made aware to return if symptoms worsen or if any life threatening symptoms occur.   

## 2014-09-30 DIAGNOSIS — M25511 Pain in right shoulder: Secondary | ICD-10-CM | POA: Diagnosis not present

## 2014-10-30 DIAGNOSIS — M25511 Pain in right shoulder: Secondary | ICD-10-CM | POA: Diagnosis not present

## 2014-11-28 DIAGNOSIS — M19011 Primary osteoarthritis, right shoulder: Secondary | ICD-10-CM | POA: Diagnosis not present

## 2014-11-28 DIAGNOSIS — F339 Major depressive disorder, recurrent, unspecified: Secondary | ICD-10-CM | POA: Diagnosis not present

## 2014-11-28 DIAGNOSIS — M5410 Radiculopathy, site unspecified: Secondary | ICD-10-CM | POA: Diagnosis not present

## 2014-12-29 DIAGNOSIS — I10 Essential (primary) hypertension: Secondary | ICD-10-CM | POA: Diagnosis not present

## 2014-12-29 DIAGNOSIS — R7301 Impaired fasting glucose: Secondary | ICD-10-CM | POA: Diagnosis not present

## 2015-01-29 DIAGNOSIS — J00 Acute nasopharyngitis [common cold]: Secondary | ICD-10-CM | POA: Diagnosis not present

## 2015-01-29 DIAGNOSIS — J209 Acute bronchitis, unspecified: Secondary | ICD-10-CM | POA: Diagnosis not present

## 2015-01-29 DIAGNOSIS — F339 Major depressive disorder, recurrent, unspecified: Secondary | ICD-10-CM | POA: Diagnosis not present

## 2015-01-29 DIAGNOSIS — E875 Hyperkalemia: Secondary | ICD-10-CM | POA: Diagnosis not present

## 2015-02-05 DIAGNOSIS — J45998 Other asthma: Secondary | ICD-10-CM | POA: Diagnosis not present

## 2015-03-08 DIAGNOSIS — J45998 Other asthma: Secondary | ICD-10-CM | POA: Diagnosis not present

## 2015-04-08 DIAGNOSIS — J45998 Other asthma: Secondary | ICD-10-CM | POA: Diagnosis not present

## 2015-04-30 DIAGNOSIS — M25511 Pain in right shoulder: Secondary | ICD-10-CM | POA: Diagnosis not present

## 2015-04-30 DIAGNOSIS — J00 Acute nasopharyngitis [common cold]: Secondary | ICD-10-CM | POA: Diagnosis not present

## 2015-04-30 DIAGNOSIS — H6501 Acute serous otitis media, right ear: Secondary | ICD-10-CM | POA: Diagnosis not present

## 2015-04-30 DIAGNOSIS — J45902 Unspecified asthma with status asthmaticus: Secondary | ICD-10-CM | POA: Diagnosis not present

## 2015-05-06 DIAGNOSIS — J45998 Other asthma: Secondary | ICD-10-CM | POA: Diagnosis not present

## 2015-06-06 DIAGNOSIS — J45998 Other asthma: Secondary | ICD-10-CM | POA: Diagnosis not present

## 2015-07-06 DIAGNOSIS — J45998 Other asthma: Secondary | ICD-10-CM | POA: Diagnosis not present

## 2015-07-29 DIAGNOSIS — R7301 Impaired fasting glucose: Secondary | ICD-10-CM | POA: Diagnosis not present

## 2015-07-29 DIAGNOSIS — E039 Hypothyroidism, unspecified: Secondary | ICD-10-CM | POA: Diagnosis not present

## 2015-07-29 DIAGNOSIS — E782 Mixed hyperlipidemia: Secondary | ICD-10-CM | POA: Diagnosis not present

## 2015-07-30 DIAGNOSIS — F5101 Primary insomnia: Secondary | ICD-10-CM | POA: Diagnosis not present

## 2015-07-30 DIAGNOSIS — J45909 Unspecified asthma, uncomplicated: Secondary | ICD-10-CM | POA: Diagnosis not present

## 2015-07-30 DIAGNOSIS — R7301 Impaired fasting glucose: Secondary | ICD-10-CM | POA: Diagnosis not present

## 2015-07-30 DIAGNOSIS — G894 Chronic pain syndrome: Secondary | ICD-10-CM | POA: Diagnosis not present

## 2015-08-06 DIAGNOSIS — J45998 Other asthma: Secondary | ICD-10-CM | POA: Diagnosis not present

## 2015-09-05 DIAGNOSIS — J45998 Other asthma: Secondary | ICD-10-CM | POA: Diagnosis not present

## 2015-10-01 DIAGNOSIS — G894 Chronic pain syndrome: Secondary | ICD-10-CM | POA: Diagnosis not present

## 2015-10-06 DIAGNOSIS — J45998 Other asthma: Secondary | ICD-10-CM | POA: Diagnosis not present

## 2015-10-13 ENCOUNTER — Encounter: Payer: Self-pay | Admitting: *Deleted

## 2015-10-21 ENCOUNTER — Encounter: Payer: Self-pay | Admitting: Obstetrics & Gynecology

## 2015-11-03 DIAGNOSIS — N6459 Other signs and symptoms in breast: Secondary | ICD-10-CM | POA: Diagnosis not present

## 2015-11-04 ENCOUNTER — Other Ambulatory Visit (HOSPITAL_COMMUNITY): Payer: Self-pay | Admitting: Internal Medicine

## 2015-11-04 DIAGNOSIS — N644 Mastodynia: Secondary | ICD-10-CM

## 2015-11-05 ENCOUNTER — Encounter: Payer: Self-pay | Admitting: Obstetrics & Gynecology

## 2015-11-06 DIAGNOSIS — J45998 Other asthma: Secondary | ICD-10-CM | POA: Diagnosis not present

## 2015-11-08 ENCOUNTER — Encounter: Payer: Self-pay | Admitting: Obstetrics & Gynecology

## 2015-11-08 ENCOUNTER — Other Ambulatory Visit (HOSPITAL_COMMUNITY): Payer: Self-pay | Admitting: Internal Medicine

## 2015-11-08 DIAGNOSIS — Z1231 Encounter for screening mammogram for malignant neoplasm of breast: Secondary | ICD-10-CM

## 2015-11-09 ENCOUNTER — Encounter (HOSPITAL_COMMUNITY): Payer: Medicare Other

## 2015-11-10 ENCOUNTER — Ambulatory Visit (HOSPITAL_COMMUNITY)
Admission: RE | Admit: 2015-11-10 | Discharge: 2015-11-10 | Disposition: A | Discharge status: Home / Self Care | Payer: Medicare Other | Source: Ambulatory Visit | Attending: Internal Medicine | Admitting: Internal Medicine

## 2015-11-10 DIAGNOSIS — N644 Mastodynia: Secondary | ICD-10-CM | POA: Diagnosis not present

## 2015-11-10 DIAGNOSIS — Z1231 Encounter for screening mammogram for malignant neoplasm of breast: Secondary | ICD-10-CM

## 2015-11-30 DIAGNOSIS — M25552 Pain in left hip: Secondary | ICD-10-CM | POA: Diagnosis not present

## 2015-11-30 DIAGNOSIS — M5432 Sciatica, left side: Secondary | ICD-10-CM | POA: Diagnosis not present

## 2015-11-30 DIAGNOSIS — M7072 Other bursitis of hip, left hip: Secondary | ICD-10-CM | POA: Diagnosis not present

## 2015-12-06 DIAGNOSIS — J45998 Other asthma: Secondary | ICD-10-CM | POA: Diagnosis not present

## 2016-02-04 DIAGNOSIS — J45998 Other asthma: Secondary | ICD-10-CM | POA: Diagnosis not present

## 2016-02-04 DIAGNOSIS — M25511 Pain in right shoulder: Secondary | ICD-10-CM | POA: Diagnosis not present

## 2016-02-04 DIAGNOSIS — G2581 Restless legs syndrome: Secondary | ICD-10-CM | POA: Diagnosis not present

## 2016-02-04 DIAGNOSIS — G8929 Other chronic pain: Secondary | ICD-10-CM | POA: Diagnosis not present

## 2016-02-04 DIAGNOSIS — M25552 Pain in left hip: Secondary | ICD-10-CM | POA: Diagnosis not present

## 2016-02-10 DIAGNOSIS — Z0000 Encounter for general adult medical examination without abnormal findings: Secondary | ICD-10-CM | POA: Diagnosis not present

## 2016-03-06 DIAGNOSIS — J060 Acute laryngopharyngitis: Secondary | ICD-10-CM | POA: Diagnosis not present

## 2016-03-06 DIAGNOSIS — G894 Chronic pain syndrome: Secondary | ICD-10-CM | POA: Diagnosis not present

## 2016-03-22 DIAGNOSIS — D509 Iron deficiency anemia, unspecified: Secondary | ICD-10-CM | POA: Diagnosis not present

## 2016-03-22 DIAGNOSIS — R7301 Impaired fasting glucose: Secondary | ICD-10-CM | POA: Diagnosis not present

## 2016-03-22 DIAGNOSIS — E782 Mixed hyperlipidemia: Secondary | ICD-10-CM | POA: Diagnosis not present

## 2016-03-24 DIAGNOSIS — E782 Mixed hyperlipidemia: Secondary | ICD-10-CM | POA: Diagnosis not present

## 2016-03-24 DIAGNOSIS — F5101 Primary insomnia: Secondary | ICD-10-CM | POA: Diagnosis not present

## 2016-03-24 DIAGNOSIS — G894 Chronic pain syndrome: Secondary | ICD-10-CM | POA: Diagnosis not present

## 2016-03-24 DIAGNOSIS — R7301 Impaired fasting glucose: Secondary | ICD-10-CM | POA: Diagnosis not present

## 2016-03-31 DIAGNOSIS — M19111 Post-traumatic osteoarthritis, right shoulder: Secondary | ICD-10-CM | POA: Diagnosis not present

## 2016-03-31 DIAGNOSIS — M25511 Pain in right shoulder: Secondary | ICD-10-CM | POA: Diagnosis not present

## 2016-05-01 DIAGNOSIS — M75100 Unspecified rotator cuff tear or rupture of unspecified shoulder, not specified as traumatic: Secondary | ICD-10-CM | POA: Diagnosis not present

## 2016-05-01 DIAGNOSIS — M5412 Radiculopathy, cervical region: Secondary | ICD-10-CM | POA: Diagnosis not present

## 2016-05-01 DIAGNOSIS — M19019 Primary osteoarthritis, unspecified shoulder: Secondary | ICD-10-CM | POA: Diagnosis not present

## 2016-05-01 DIAGNOSIS — M7541 Impingement syndrome of right shoulder: Secondary | ICD-10-CM | POA: Diagnosis not present

## 2016-05-04 DIAGNOSIS — M25511 Pain in right shoulder: Secondary | ICD-10-CM | POA: Diagnosis not present

## 2016-05-04 DIAGNOSIS — M75101 Unspecified rotator cuff tear or rupture of right shoulder, not specified as traumatic: Secondary | ICD-10-CM | POA: Diagnosis not present

## 2016-05-04 DIAGNOSIS — M659 Synovitis and tenosynovitis, unspecified: Secondary | ICD-10-CM | POA: Diagnosis not present

## 2016-05-04 DIAGNOSIS — S46111A Strain of muscle, fascia and tendon of long head of biceps, right arm, initial encounter: Secondary | ICD-10-CM | POA: Diagnosis not present

## 2016-05-11 DIAGNOSIS — M75100 Unspecified rotator cuff tear or rupture of unspecified shoulder, not specified as traumatic: Secondary | ICD-10-CM | POA: Diagnosis not present

## 2016-05-11 DIAGNOSIS — M19019 Primary osteoarthritis, unspecified shoulder: Secondary | ICD-10-CM | POA: Diagnosis not present

## 2016-05-11 DIAGNOSIS — M7541 Impingement syndrome of right shoulder: Secondary | ICD-10-CM | POA: Diagnosis not present

## 2016-05-11 DIAGNOSIS — M19111 Post-traumatic osteoarthritis, right shoulder: Secondary | ICD-10-CM | POA: Diagnosis not present

## 2016-05-11 DIAGNOSIS — M5412 Radiculopathy, cervical region: Secondary | ICD-10-CM | POA: Diagnosis not present

## 2016-05-24 DIAGNOSIS — H6501 Acute serous otitis media, right ear: Secondary | ICD-10-CM | POA: Diagnosis not present

## 2016-06-07 DIAGNOSIS — S42031P Displaced fracture of lateral end of right clavicle, subsequent encounter for fracture with malunion: Secondary | ICD-10-CM | POA: Diagnosis not present

## 2016-06-07 DIAGNOSIS — E7800 Pure hypercholesterolemia, unspecified: Secondary | ICD-10-CM | POA: Diagnosis not present

## 2016-06-07 DIAGNOSIS — J45909 Unspecified asthma, uncomplicated: Secondary | ICD-10-CM | POA: Diagnosis not present

## 2016-06-07 DIAGNOSIS — K219 Gastro-esophageal reflux disease without esophagitis: Secondary | ICD-10-CM | POA: Diagnosis not present

## 2016-06-07 DIAGNOSIS — Z8249 Family history of ischemic heart disease and other diseases of the circulatory system: Secondary | ICD-10-CM | POA: Diagnosis not present

## 2016-06-07 DIAGNOSIS — M19111 Post-traumatic osteoarthritis, right shoulder: Secondary | ICD-10-CM | POA: Diagnosis not present

## 2016-06-07 DIAGNOSIS — M5412 Radiculopathy, cervical region: Secondary | ICD-10-CM | POA: Diagnosis not present

## 2016-06-07 DIAGNOSIS — M75121 Complete rotator cuff tear or rupture of right shoulder, not specified as traumatic: Secondary | ICD-10-CM | POA: Diagnosis not present

## 2016-06-07 DIAGNOSIS — Z79899 Other long term (current) drug therapy: Secondary | ICD-10-CM | POA: Diagnosis not present

## 2016-06-07 DIAGNOSIS — M7541 Impingement syndrome of right shoulder: Secondary | ICD-10-CM | POA: Diagnosis not present

## 2016-06-07 DIAGNOSIS — M19011 Primary osteoarthritis, right shoulder: Secondary | ICD-10-CM | POA: Diagnosis not present

## 2016-06-07 DIAGNOSIS — Z833 Family history of diabetes mellitus: Secondary | ICD-10-CM | POA: Diagnosis not present

## 2016-06-09 DIAGNOSIS — S42031P Displaced fracture of lateral end of right clavicle, subsequent encounter for fracture with malunion: Secondary | ICD-10-CM | POA: Diagnosis not present

## 2016-06-09 DIAGNOSIS — G8918 Other acute postprocedural pain: Secondary | ICD-10-CM | POA: Diagnosis not present

## 2016-06-09 DIAGNOSIS — Z79899 Other long term (current) drug therapy: Secondary | ICD-10-CM | POA: Diagnosis not present

## 2016-06-09 DIAGNOSIS — Z8249 Family history of ischemic heart disease and other diseases of the circulatory system: Secondary | ICD-10-CM | POA: Diagnosis not present

## 2016-06-09 DIAGNOSIS — M7521 Bicipital tendinitis, right shoulder: Secondary | ICD-10-CM | POA: Diagnosis not present

## 2016-06-09 DIAGNOSIS — M19011 Primary osteoarthritis, right shoulder: Secondary | ICD-10-CM | POA: Diagnosis not present

## 2016-06-09 DIAGNOSIS — M7541 Impingement syndrome of right shoulder: Secondary | ICD-10-CM | POA: Diagnosis not present

## 2016-06-09 DIAGNOSIS — Z833 Family history of diabetes mellitus: Secondary | ICD-10-CM | POA: Diagnosis not present

## 2016-06-09 DIAGNOSIS — E7800 Pure hypercholesterolemia, unspecified: Secondary | ICD-10-CM | POA: Diagnosis not present

## 2016-06-09 DIAGNOSIS — K219 Gastro-esophageal reflux disease without esophagitis: Secondary | ICD-10-CM | POA: Diagnosis not present

## 2016-06-09 DIAGNOSIS — M75121 Complete rotator cuff tear or rupture of right shoulder, not specified as traumatic: Secondary | ICD-10-CM | POA: Diagnosis not present

## 2016-06-09 DIAGNOSIS — M19111 Post-traumatic osteoarthritis, right shoulder: Secondary | ICD-10-CM | POA: Diagnosis not present

## 2016-06-09 DIAGNOSIS — J45909 Unspecified asthma, uncomplicated: Secondary | ICD-10-CM | POA: Diagnosis not present

## 2016-06-09 DIAGNOSIS — M5412 Radiculopathy, cervical region: Secondary | ICD-10-CM | POA: Diagnosis not present

## 2016-06-09 DIAGNOSIS — S42001P Fracture of unspecified part of right clavicle, subsequent encounter for fracture with malunion: Secondary | ICD-10-CM | POA: Diagnosis not present

## 2016-06-19 DIAGNOSIS — M19111 Post-traumatic osteoarthritis, right shoulder: Secondary | ICD-10-CM | POA: Diagnosis not present

## 2016-06-28 ENCOUNTER — Ambulatory Visit (HOSPITAL_COMMUNITY): Payer: Medicare Other | Attending: Orthopedic Surgery | Admitting: Occupational Therapy

## 2016-06-28 DIAGNOSIS — M25511 Pain in right shoulder: Secondary | ICD-10-CM | POA: Insufficient documentation

## 2016-06-28 DIAGNOSIS — R29898 Other symptoms and signs involving the musculoskeletal system: Secondary | ICD-10-CM | POA: Diagnosis not present

## 2016-06-28 NOTE — Patient Instructions (Signed)
SHOULDER: Flexion On Table   Place hands on table, elbows straight. Move hips away from body. Press hands down into table.  _10-15__ reps per set, _2-3__ sets per day  Abduction (Passive)   With arm out to side, resting on table, lower head toward arm, keeping trunk away from table.  Repeat __10-15__ times. Do _2-3___ sessions per day.  Copyright  VHI. All rights reserved.     Internal Rotation (Assistive)   Seated with elbow bent at right angle and held against side, slide arm on table surface in an inward arc. Repeat _10-15___ times. Do _2-3___ sessions per day. Activity: Use this motion to brush crumbs off the table.  Copyright  VHI. All rights reserved.    

## 2016-06-28 NOTE — Therapy (Addendum)
[place], [name], [address] Phone: [phone]   Fax:  [phone]  Occupational Therapy Evaluation  Patient Details  Name: Jessica Stevens MRN: [id number] Date of Birth: [birth year] Referring Provider: Dr. Remo Lipps Case  Encounter Date: 06/28/2016      OT End of Session - 06/28/16 1403    Visit Number 1   Number of Visits 16   Date for OT Re-Evaluation 08/27/16  mini reassessment 07/27/2016   Authorization Type [employer] Medicare   Authorization Time Period $25 copay; before 10th visit   Authorization - Visit Number 1   Authorization - Number of Visits 10   OT Start Time 1036   OT Stop Time 1110   OT Time Calculation (min) 34 min   Activity Tolerance Patient tolerated treatment well   Behavior During Therapy [facility] for tasks assessed/performed      Past Medical History:  Diagnosis Date  . Ankle pain, left   . Arthritis of knee, degenerative   . Asthma   . Carpal tunnel syndrome   . Cervical radiculopathy   . Chronic back pain   . Complete rupture of rotator cuff   . Complete rupture of rotator cuff   . Depression   . Gynecomastia   . HNP (herniated nucleus pulposus), cervical   . HNP (herniated nucleus pulposus), cervical   . Hyperlipidemia   . Knee pain   . OA (osteoarthritis) of knee   . Obesity   . Urinary incontinence     Past Surgical History:  Procedure Laterality Date  . arthroscopy  left knee  08/14/2008   Dr. Aline Brochure   . COLONOSCOPY N/A 03/11/2014   Procedure: COLONOSCOPY;  Surgeon: Jessica Houston, MD;  [facility];  Service: Endoscopy;  Laterality: N/A;  730 - moved to 1/13 @ 12:00  . PARTIAL HYSTERECTOMY    . tube insert in right ear  2003    There were no vitals filed for this visit.      Subjective Assessment - 06/28/16 1356    Subjective  S: I took that pillow off my sling to drive.    Pertinent History Pt is a 51 y/o female s/p Right RCR on 06/09/16, presenting  with arm in sling minus abductor pillow. Pt reports she is using ice and pain medication for pain management. Pt was referred to occupational therapy for evaluation and treatment by Dr. Remo Lipps Case.    Patient Stated Goals To be able to use my right arm.    Currently in Pain? Yes   Pain Score 4    Pain Location Shoulder   Pain Orientation Right   Pain Descriptors / Indicators Aching;Constant   Pain Type Acute pain   Pain Radiating Towards elbow   Pain Onset 1 to 4 weeks ago   Pain Frequency Constant   Aggravating Factors  movement   Pain Relieving Factors ice, pain medications   Effect of Pain on Daily Activities unable to use RUE for ADL completion   Multiple Pain Sites No           [facility] OT Assessment - 06/28/16 1037      Assessment   Diagnosis Right RCR   Referring Provider Dr. Remo Lipps Case   Onset Date 06/09/16   Prior Therapy none     Precautions   Precautions Shoulder   Type of Shoulder Precautions Phase I weeks 0-6 (4/13-5/25): P/ROM as follows-140 flexion, 40 er, 60 abduction, pendulums; phase II  weeks 6-12 (5/26-7/6): AA/ROM progressing to A/ROM. Phase III after week 12: begin strengthening   Shoulder Interventions Shoulder sling/immobilizer;At all times     Balance Screen   Has the patient fallen in the past 6 months No   Has the patient had a decrease in activity level because of a fear of falling?  No   Is the patient reluctant to leave their home because of a fear of falling?  No     Prior Function   Level of Independence Independent   Vocation On disability   Leisure playing tamborine, decorating home, baking     ADL   ADL comments pt is unable to use RUE for any ADL tasks     Written Expression   Dominant Hand Right     Cognition   Overall Cognitive Status Within Functional Limits for tasks assessed     ROM / Strength   AROM / PROM / Strength AROM;PROM;Strength     Palpation   Palpation comment Moderate fascial restrictions in right upper arm,  trapezius, and scapularis regions     AROM   Overall AROM  Unable to assess;Due to precautions     PROM   Overall PROM Comments Assessed supine, er/IR adducted   PROM Assessment Site Shoulder   Right/Left Shoulder Right   Right Shoulder Flexion 132 Degrees   Right Shoulder ABduction 72 Degrees   Right Shoulder Internal Rotation 90 Degrees   Right Shoulder External Rotation 30 Degrees     Strength   Overall Strength Unable to assess;Due to precautions                         OT Education - 06/28/16 1403    Education provided Yes   Education Details table slides   Person(s) Educated Patient   Methods Explanation;Demonstration;Handout   Comprehension Verbalized understanding;Returned demonstration          OT Short Term Goals - 06/28/16 1409      OT SHORT TERM GOAL #1   Title Pt will be provided with HEP to improve RUE use as dominant during daily tasks.    Time 4   Period Weeks   Status New     OT SHORT TERM GOAL #2   Title Pt will decrease RUE pain to 4/10 to improve ability to use RUE as assist during dressing tasks.    Time 4   Period Weeks   Status New     OT SHORT TERM GOAL #3   Title Pt will decrease RUE fascial restrictions from mod to min amounts to improve mobility required for functional reaching.    Time 4   Period Weeks   Status New     OT SHORT TERM GOAL #4   Title Pt will improve RUE P/ROM to [facility] to improve ability to use RUE as assist during ADL completion.    Time 4   Period Weeks   Status New     OT SHORT TERM GOAL #5   Title Pt will improve RUE strength to 3/5 to improve ability to reach items at waist level.    Time 4   Period Weeks   Status New           OT Long Term Goals - 06/28/16 1411      OT LONG TERM GOAL #1   Title Pt will return to prior level of functioning and independence using RUE as dominant during daily tasks.  Time 8   Period Weeks   Status New     OT LONG TERM GOAL #2   Title Pt will  decrease pain in RUE to 3/10 or less to improve use of RUE during grooming tasks.    Time 8   Period Weeks   Status New     OT LONG TERM GOAL #3   Title Pt will decrease RUE fascial restrictions from min to trace amounts to improve mobility required for reaching overhead shelves.    Time 8   Period Weeks   Status New     OT LONG TERM GOAL #4   Title Pt will improve A/ROM to [facility] to improve ability to hang decorations in her house.    Time 8   Period Weeks   Status New     OT LONG TERM GOAL #5   Title Pt will improve RUE strength to 4/5 to improve ability to use RUE as dominant during cooking and baking tasks.    Time 8   Period Weeks   Status New               Plan - Jul 02, 2016 1404    Clinical Impression Statement A: Pt is a 51 y/o female s/p right RCR on 06/09/16 presenting with pain, ROM, and strength deficits limiting functional use of RUE. Pt with infraspinatus repair, supraspinatus not repairable per MD protocol notes. Pt provided with table side exercises for HEP this session. Pt is unsure of ability to pay insurance copay and is going to call insurance company to clarify visits before scheduling additional appointments.    Rehab Potential Good   OT Frequency 2x / week   OT Duration 8 weeks   OT Treatment/Interventions Self-care/ADL training;Therapeutic exercise;Patient/family education;Ultrasound;Manual Therapy;Therapeutic activities;Cryotherapy;Electrical Stimulation;Moist Heat;Passive range of motion   Plan P: Pt will benefit from skilled OT services to decrease pain and fascial restrictions, increase ROM and strength, and improve RUE functional use as dominant. Treatment plan: myofascial release, manual therapy, P/ROM, AA/ROM, A/ROM, scapular stability and strengthening, general RUE strengthening, modalities as needed   OT Home Exercise Plan 5/2: table slides   Consulted and Agree with Plan of Care Patient      Patient will benefit from skilled therapeutic  intervention in order to improve the following deficits and impairments:  Decreased activity tolerance, Decreased strength, Decreased range of motion, Pain, Impaired UE functional use, Increased fascial restricitons, Impaired flexibility  Visit Diagnosis: Acute pain of right shoulder  Other symptoms and signs involving the musculoskeletal system      G-Codes - 07-02-16 1414    Functional Assessment Tool Used (Outpatient only) clinical judgement   Functional Limitation Carrying, moving and handling objects   Carrying, Moving and Handling Objects Current Status (V7858) At least 80 percent but less than 100 percent impaired, limited or restricted   Carrying, Moving and Handling Objects Goal Status (I5027) At least 40 percent but less than 60 percent impaired, limited or restricted      Problem List Patient Active Problem List   Diagnosis Date Noted  . Guaiac positive stools 12/30/2013  . GERD 03/25/2010  . GUAIAC POSITIVE STOOL 03/25/2010  . NECK PAIN, CHRONIC 03/25/2010  . HEADACHE 12/30/2009  . IMPAIRED FASTING GLUCOSE 12/30/2009  . ROM 11/26/2009  . TINEA CRURIS 06/29/2009  . CANDIDIASIS 06/29/2009  . ECZEMA 03/17/2009  . CARPAL TUNNEL SYNDROME 12/15/2008  . INSOMNIA 09/13/2008  . BACK PAIN, LUMBAR, WITH RADICULOPATHY 09/09/2008  . HYPERLIPIDEMIA 08/09/2008  . FOOT PAIN,  BILATERAL 08/04/2008  . KNEE, ARTHRITIS, DEGEN./OSTEO 07/29/2008  . FATIGUE 05/18/2008  . ALLERGIC RHINITIS 10/17/2007  . SHOULDER PAIN, BILATERAL 10/17/2007  . CERVICAL RADICULOPATHY 10/07/2007  . ANKLE PAIN, LEFT 08/19/2007  . OBESITY, UNSPECIFIED 06/25/2007  . DEPRESSION 06/25/2007  . ASTHMA, UNSPECIFIED, UNSPECIFIED STATUS 06/25/2007  . GYNECOMASTIA 06/25/2007  . URINARY INCONTINENCE 06/25/2007   Guadelupe Sabin, OTR/L  [phone] 06/28/2016, 2:16 PM  [place] [address] [place], [name], [address] Phone: [phone]   Fax:  [phone]  Name:  SERAH NICOLETTI MRN: [id number] Date of Birth: [birth year]

## 2016-07-05 ENCOUNTER — Ambulatory Visit (HOSPITAL_COMMUNITY): Payer: Medicare Other | Admitting: Specialist

## 2016-07-05 DIAGNOSIS — R29898 Other symptoms and signs involving the musculoskeletal system: Secondary | ICD-10-CM

## 2016-07-05 DIAGNOSIS — M25511 Pain in right shoulder: Secondary | ICD-10-CM

## 2016-07-05 NOTE — Patient Instructions (Signed)
         COMPLETE PENDULUM EXERCISES FOR 30 SECONDS TO A MINUTE EACH, 3-5 TIMES PER DAY. ROM: Pendulum (Side-to-Side)   Deje que el brazo derecho se balancee suavemente de lado a lado mientras oscila el cuerpo de [place]. Repita ____ veces por rutina. Realice ____ rutinas por sesin. Realice ____ sesiones por da.  http://orth.exer.us/792   Copyright  VHI. All rights reserved.  Pendulum Forward/Back   Bend forward 90 at waist, using table for support. Rock body forward and back to swing arm. Repeat ____ times. Do ____ sessions per day.  Copyright  VHI. All rights reserved.  Pendulum Circular   Bend forward 90 at waist, leaning on table for support. Rock body in a circular pattern to move arm clockwise ____ times then counterclockwise ____ times. Do ____ sessions per day.  Copyright  VHI. All rights reserved.

## 2016-07-05 NOTE — Therapy (Signed)
[place], [name], [address] Phone: [phone]   Fax:  [phone]  Occupational Therapy Treatment  Patient Details  Name: Jessica Stevens MRN: [id number] Date of Birth: 22-Jan-1966 Referring Provider: Dr. Remo Lipps Case  Encounter Date: 07/05/2016      OT End of Session - 07/05/16 1203    Visit Number 2   Number of Visits 16   Date for OT Re-Evaluation 08/27/16  mini reassess on 5/31, md on 5/23   Authorization Type [employer] Medicare   Authorization Time Period $25 copay; before 10th visit   Authorization - Visit Number 2   Authorization - Number of Visits 10   OT Start Time 1033   OT Stop Time 1130   OT Time Calculation (min) 57 min   Activity Tolerance Patient tolerated treatment well   Behavior During Therapy [facility] for tasks assessed/performed      Past Medical History:  Diagnosis Date  . Ankle pain, left   . Arthritis of knee, degenerative   . Asthma   . Carpal tunnel syndrome   . Cervical radiculopathy   . Chronic back pain   . Complete rupture of rotator cuff   . Complete rupture of rotator cuff   . Depression   . Gynecomastia   . HNP (herniated nucleus pulposus), cervical   . HNP (herniated nucleus pulposus), cervical   . Hyperlipidemia   . Knee pain   . OA (osteoarthritis) of knee   . Obesity   . Urinary incontinence     Past Surgical History:  Procedure Laterality Date  . arthroscopy  left knee  08/14/2008   Dr. Aline Brochure   . COLONOSCOPY N/A 03/11/2014   Procedure: COLONOSCOPY;  Surgeon: Rogene Houston, MD;  [facility];  Service: Endoscopy;  Laterality: N/A;  730 - moved to 1/13 @ 12:00  . PARTIAL HYSTERECTOMY    . tube insert in right ear  2003    There were no vitals filed for this visit.      Subjective Assessment - 07/05/16 1202    Subjective  S:  It has bothered me the last two nights   Currently in Pain? Yes   Pain Score 8    Pain Location Shoulder   Pain  Orientation Right   Pain Descriptors / Indicators Aching;Constant   Pain Type Acute pain   Pain Onset 1 to 4 weeks ago   Pain Frequency Constant   Aggravating Factors  use   Pain Relieving Factors ice, medication   Effect of Pain on Daily Activities decreased use of right arm with functional activities            [facility] OT Assessment - 07/05/16 0001      Assessment   Diagnosis Right RCR     Precautions   Precautions Shoulder   Type of Shoulder Precautions Phase I weeks 0-6 (4/13-5/25): P/ROM as follows-140 flexion, 40 er, 60 abduction, pendulums; phase II weeks 6-12 (5/26-7/6): AA/ROM progressing to A/ROM. Phase III after week 12: begin strengthening     Observation/Other Assessments   Focus on Therapeutic Outcomes (FOTO)  FOTO 18/100                  OT Treatments/Exercises (OP) - 07/05/16 0001      Exercises   Exercises Shoulder     Shoulder Exercises: Supine   Protraction PROM;5 reps   External Rotation PROM;5 reps   Internal Rotation PROM;5 reps  Flexion PROM;5 reps   ABduction PROM;5 reps   Other Supine Exercises serratus anterior punches with shoulder unweighted by therapist 10 times     Shoulder Exercises: Seated   Elevation AROM;10 reps   Extension AROM;10 reps   Row AROM;10 reps   Other Seated Exercises elbow flexion, extension, supination, pronation, wrist flexion, extension 10 times each     Shoulder Exercises: Therapy Ball   Flexion 10 reps   ABduction 10 reps     Shoulder Exercises: Isometric Strengthening   Flexion Supine;3X3"   Extension Supine;3X3"   External Rotation Supine;3X3"   Internal Rotation Supine;3X3"   ABduction Supine;3X3"   ADduction Supine;3X3"     Modalities   Modalities Electrical Stimulation;Moist Heat     Moist Heat Therapy   Number Minutes Moist Heat 15 Minutes   Moist Heat Location Shoulder  right with IFES     Electrical Stimulation   Electrical Stimulation Location right shoulder   Electrical  Stimulation Action continued sweeping   Electrical Stimulation Parameters 5.5 cv   Electrical Stimulation Goals Pain     Manual Therapy   Manual Therapy Myofascial release   Manual therapy comments manual therapy completed seperately from all other interventions this date of service   Myofascial Release myofascial release and manual stretching  to right upper arm, scapular, trapezius and shoulder region to decrease pain and improve pain freem mobility in right shoulder region                 OT Education - 07/05/16 1203    Education provided Yes   Education Details pendulum exercises, reviewed plan of care   Person(s) Educated Patient   Methods Explanation;Demonstration;Handout   Comprehension Verbalized understanding;Returned demonstration          OT Short Term Goals - 07/05/16 1205      OT SHORT TERM GOAL #1   Title Pt will be provided with HEP to improve RUE use as dominant during daily tasks.    Time 4   Period Weeks   Status On-going     OT SHORT TERM GOAL #2   Title Pt will decrease RUE pain to 4/10 to improve ability to use RUE as assist during dressing tasks.    Time 4   Period Weeks   Status On-going     OT SHORT TERM GOAL #3   Title Pt will decrease RUE fascial restrictions from mod to min amounts to improve mobility required for functional reaching.    Time 4   Period Weeks   Status On-going     OT SHORT TERM GOAL #4   Title Pt will improve RUE P/ROM to [facility] to improve ability to use RUE as assist during ADL completion.    Time 4   Period Weeks   Status On-going     OT SHORT TERM GOAL #5   Title Pt will improve RUE strength to 3/5 to improve ability to reach items at waist level.    Time 4   Period Weeks   Status On-going           OT Long Term Goals - 07/05/16 1205      OT LONG TERM GOAL #1   Title Pt will return to prior level of functioning and independence using RUE as dominant during daily tasks.    Time 8   Period Weeks    Status On-going     OT LONG TERM GOAL #2   Title Pt will decrease pain in  RUE to 3/10 or less to improve use of RUE during grooming tasks.    Time 8   Period Weeks   Status On-going     OT LONG TERM GOAL #3   Title Pt will decrease RUE fascial restrictions from min to trace amounts to improve mobility required for reaching overhead shelves.    Time 8   Period Weeks   Status On-going     OT LONG TERM GOAL #4   Title Pt will improve A/ROM to [facility] to improve ability to hang decorations in her house.    Time 8   Period Weeks   Status On-going     OT LONG TERM GOAL #5   Title Pt will improve RUE strength to 4/5 to improve ability to use RUE as dominant during cooking and baking tasks.    Time 8   Period Weeks   Status On-going               Plan - 07/05/16 1203    Clinical Impression Statement A:  Patient able to tolerate P/ROM within limitations of protocol this date.  added IFES for pain control with heat.  After application, patient's pain decreased to 1/10.    Plan P:  Continue use of IFES with heat for pain control.  Increase contraction time with isometrics.    OT Home Exercise Plan 5/2: table slides; 07/05/16 pendulums      Patient will benefit from skilled therapeutic intervention in order to improve the following deficits and impairments:  Decreased activity tolerance, Decreased strength, Decreased range of motion, Pain, Impaired UE functional use, Increased fascial restricitons, Impaired flexibility  Visit Diagnosis: Acute pain of right shoulder  Other symptoms and signs involving the musculoskeletal system    Problem List Patient Active Problem List   Diagnosis Date Noted  . Guaiac positive stools 12/30/2013  . GERD 03/25/2010  . GUAIAC POSITIVE STOOL 03/25/2010  . NECK PAIN, CHRONIC 03/25/2010  . HEADACHE 12/30/2009  . IMPAIRED FASTING GLUCOSE 12/30/2009  . ROM 11/26/2009  . TINEA CRURIS 06/29/2009  . CANDIDIASIS 06/29/2009  . ECZEMA 03/17/2009  .  CARPAL TUNNEL SYNDROME 12/15/2008  . INSOMNIA 09/13/2008  . BACK PAIN, LUMBAR, WITH RADICULOPATHY 09/09/2008  . HYPERLIPIDEMIA 08/09/2008  . FOOT PAIN, BILATERAL 08/04/2008  . KNEE, ARTHRITIS, DEGEN./OSTEO 07/29/2008  . FATIGUE 05/18/2008  . ALLERGIC RHINITIS 10/17/2007  . SHOULDER PAIN, BILATERAL 10/17/2007  . CERVICAL RADICULOPATHY 10/07/2007  . ANKLE PAIN, LEFT 08/19/2007  . OBESITY, UNSPECIFIED 06/25/2007  . DEPRESSION 06/25/2007  . ASTHMA, UNSPECIFIED, UNSPECIFIED STATUS 06/25/2007  . GYNECOMASTIA 06/25/2007  . URINARY INCONTINENCE 06/25/2007    Vangie Bicker, [place], OTR/L [phone]  07/05/2016, 12:11 PM  [facility] [address] [place], [name], [address] Phone: [phone]   Fax:  [phone]  Name: Jessica Stevens MRN: [id number] Date of Birth: November 04, 1965

## 2016-07-12 ENCOUNTER — Ambulatory Visit (HOSPITAL_COMMUNITY): Payer: Medicare Other

## 2016-07-12 ENCOUNTER — Encounter (HOSPITAL_COMMUNITY): Payer: Self-pay

## 2016-07-12 DIAGNOSIS — R29898 Other symptoms and signs involving the musculoskeletal system: Secondary | ICD-10-CM | POA: Diagnosis not present

## 2016-07-12 DIAGNOSIS — M25511 Pain in right shoulder: Secondary | ICD-10-CM | POA: Diagnosis not present

## 2016-07-12 NOTE — Therapy (Signed)
[place], [name], [address] Phone: [phone]   Fax:  [phone]  Occupational Therapy Treatment  Patient Details  Name: Jessica Stevens MRN: [id number] Date of Birth: 1965/10/17 Referring Provider: Dr. Remo Lipps Case  Encounter Date: 07/12/2016      OT End of Session - 07/12/16 0927    Visit Number 3   Number of Visits 16   Date for OT Re-Evaluation 08/27/16  mini reassess on 5/31, md on 5/23   Authorization Type [employer] Medicare   Authorization Time Period $25 copay; before 10th visit   Authorization - Visit Number 3   Authorization - Number of Visits 10   OT Start Time 0820   OT Stop Time 0912   OT Time Calculation (min) 52 min   Activity Tolerance Patient tolerated treatment well   Behavior During Therapy [facility] for tasks assessed/performed      Past Medical History:  Diagnosis Date  . Ankle pain, left   . Arthritis of knee, degenerative   . Asthma   . Carpal tunnel syndrome   . Cervical radiculopathy   . Chronic back pain   . Complete rupture of rotator cuff   . Complete rupture of rotator cuff   . Depression   . Gynecomastia   . HNP (herniated nucleus pulposus), cervical   . HNP (herniated nucleus pulposus), cervical   . Hyperlipidemia   . Knee pain   . OA (osteoarthritis) of knee   . Obesity   . Urinary incontinence     Past Surgical History:  Procedure Laterality Date  . arthroscopy  left knee  08/14/2008   Dr. Aline Brochure   . COLONOSCOPY N/A 03/11/2014   Procedure: COLONOSCOPY;  Surgeon: Rogene Houston, MD;  [facility];  Service: Endoscopy;  Laterality: N/A;  730 - moved to 1/13 @ 12:00  . PARTIAL HYSTERECTOMY    . tube insert in right ear  2003    There were no vitals filed for this visit.      Subjective Assessment - 07/12/16 0850    Subjective  S: It's hard to get comfortable at night to sleep.   Currently in Pain? Yes   Pain Score 7    Pain Location Shoulder   Pain Orientation Right   Pain Descriptors / Indicators Aching   Pain Type Acute pain   Pain Radiating Towards elbow   Pain Onset 1 to 4 weeks ago   Pain Frequency Occasional   Aggravating Factors  Wrong positions   Pain Relieving Factors ice, medication   Effect of Pain on Daily Activities Unable to use RUE for any daily tasks   Multiple Pain Sites No            [facility] OT Assessment - 07/12/16 0853      Assessment   Diagnosis Right RCR     Precautions   Precautions Shoulder   Type of Shoulder Precautions Phase I weeks 0-6 (4/13-5/25): P/ROM as follows-140 flexion, 40 er, 60 abduction, pendulums; phase II weeks 6-12 (5/26-7/6): AA/ROM progressing to A/ROM. Phase III after week 12: begin strengthening   Shoulder Interventions Shoulder sling/immobilizer;At all times                  OT Treatments/Exercises (OP) - 07/12/16 1324      Exercises   Exercises Shoulder     Shoulder Exercises: Supine   Protraction PROM;5 reps   Horizontal ABduction PROM;5 reps  following protocol  External Rotation PROM;5 reps   Internal Rotation PROM;5 reps   Flexion PROM;5 reps   ABduction PROM;5 reps     Shoulder Exercises: Seated   Elevation AROM;10 reps   Extension AROM;10 reps   Row AROM;10 reps   Other Seated Exercises Elbow flexion/extension A/ROM 10X     Shoulder Exercises: Therapy Ball   Flexion 10 reps   ABduction 10 reps     Modalities   Modalities Electrical Stimulation;Cryotherapy     Cryotherapy   Number Minutes Cryotherapy 10 Minutes   Cryotherapy Location Shoulder   Type of Cryotherapy Ice pack     Electrical Stimulation   Electrical Stimulation Location right shoulder   Electrical Stimulation Action interferential   Electrical Stimulation Parameters 12.0 CV   Electrical Stimulation Goals Pain     Manual Therapy   Manual Therapy Myofascial release   Manual therapy comments manual therapy completed seperately from all other interventions this date of  service   Myofascial Release myofascial release and manual stretching  to right upper arm, scapular, trapezius and shoulder region to decrease pain and improve pain freem mobility in right shoulder region                 OT Education - 07/12/16 0925    Education provided Yes   Education Details Educated patient to not use Right arm for any daily tasks at this point as patient is only allowed to due passive ROM.   Person(s) Educated Patient   Methods Explanation   Comprehension Verbalized understanding          OT Short Term Goals - 07/05/16 1205      OT SHORT TERM GOAL #1   Title Pt will be provided with HEP to improve RUE use as dominant during daily tasks.    Time 4   Period Weeks   Status On-going     OT SHORT TERM GOAL #2   Title Pt will decrease RUE pain to 4/10 to improve ability to use RUE as assist during dressing tasks.    Time 4   Period Weeks   Status On-going     OT SHORT TERM GOAL #3   Title Pt will decrease RUE fascial restrictions from mod to min amounts to improve mobility required for functional reaching.    Time 4   Period Weeks   Status On-going     OT SHORT TERM GOAL #4   Title Pt will improve RUE P/ROM to [facility] to improve ability to use RUE as assist during ADL completion.    Time 4   Period Weeks   Status On-going     OT SHORT TERM GOAL #5   Title Pt will improve RUE strength to 3/5 to improve ability to reach items at waist level.    Time 4   Period Weeks   Status On-going           OT Long Term Goals - 07/05/16 1205      OT LONG TERM GOAL #1   Title Pt will return to prior level of functioning and independence using RUE as dominant during daily tasks.    Time 8   Period Weeks   Status On-going     OT LONG TERM GOAL #2   Title Pt will decrease pain in RUE to 3/10 or less to improve use of RUE during grooming tasks.    Time 8   Period Weeks   Status On-going     OT LONG  TERM GOAL #3   Title Pt will decrease RUE fascial  restrictions from min to trace amounts to improve mobility required for reaching overhead shelves.    Time 8   Period Weeks   Status On-going     OT LONG TERM GOAL #4   Title Pt will improve A/ROM to [facility] to improve ability to hang decorations in her house.    Time 8   Period Weeks   Status On-going     OT LONG TERM GOAL #5   Title Pt will improve RUE strength to 4/5 to improve ability to use RUE as dominant during cooking and baking tasks.    Time 8   Period Weeks   Status On-going               Plan - 07/12/16 6837    Clinical Impression Statement A: Pt utilized ES at end of session for pain control and requested ice pack versus moist heat. Isometric strengthening not completed per protocol. Patient unable to tolerate shoulder flexion to 140 degrees during passive stretching. Pt reports pain level at 5/10 at end of session.   Plan P: Resume missed exercises. Add low level thumb tacks.      Patient will benefit from skilled therapeutic intervention in order to improve the following deficits and impairments:  Decreased activity tolerance, Decreased strength, Decreased range of motion, Pain, Impaired UE functional use, Increased fascial restricitons, Impaired flexibility  Visit Diagnosis: Other symptoms and signs involving the musculoskeletal system  Acute pain of right shoulder    Problem List Patient Active Problem List   Diagnosis Date Noted  . Guaiac positive stools 12/30/2013  . GERD 03/25/2010  . GUAIAC POSITIVE STOOL 03/25/2010  . NECK PAIN, CHRONIC 03/25/2010  . HEADACHE 12/30/2009  . IMPAIRED FASTING GLUCOSE 12/30/2009  . ROM 11/26/2009  . TINEA CRURIS 06/29/2009  . CANDIDIASIS 06/29/2009  . ECZEMA 03/17/2009  . CARPAL TUNNEL SYNDROME 12/15/2008  . INSOMNIA 09/13/2008  . BACK PAIN, LUMBAR, WITH RADICULOPATHY 09/09/2008  . HYPERLIPIDEMIA 08/09/2008  . FOOT PAIN, BILATERAL 08/04/2008  . KNEE, ARTHRITIS, DEGEN./OSTEO 07/29/2008  . FATIGUE 05/18/2008  .  ALLERGIC RHINITIS 10/17/2007  . SHOULDER PAIN, BILATERAL 10/17/2007  . CERVICAL RADICULOPATHY 10/07/2007  . ANKLE PAIN, LEFT 08/19/2007  . OBESITY, UNSPECIFIED 06/25/2007  . DEPRESSION 06/25/2007  . ASTHMA, UNSPECIFIED, UNSPECIFIED STATUS 06/25/2007  . GYNECOMASTIA 06/25/2007  . URINARY INCONTINENCE 06/25/2007   Ailene Ravel, OTR/L,CBIS  [phone]  07/12/2016, 9:30 AM  [place] [address] [place], [name], [address] Phone: [phone]   Fax:  [phone]  Name: Jessica Stevens MRN: [id number] Date of Birth: Dec 06, 1965

## 2016-07-14 ENCOUNTER — Ambulatory Visit (HOSPITAL_COMMUNITY): Payer: Medicare Other

## 2016-07-14 ENCOUNTER — Encounter (HOSPITAL_COMMUNITY): Payer: Self-pay

## 2016-07-14 DIAGNOSIS — R29898 Other symptoms and signs involving the musculoskeletal system: Secondary | ICD-10-CM

## 2016-07-14 DIAGNOSIS — M25511 Pain in right shoulder: Secondary | ICD-10-CM

## 2016-07-14 NOTE — Therapy (Signed)
Jessica [place], [name], [address] Phone: [phone]   Fax:  [phone]  Occupational Therapy Treatment  Patient Details  Name: Jessica Stevens MRN: [id number] Date of Birth: October 09, 1965 Referring Provider: Dr. Remo Lipps Case  Encounter Date: 07/14/2016      OT End of Session - 07/14/16 0930    Visit Number 4   Number of Visits 16   Date for OT Re-Evaluation 08/27/16  mini reassess on 5/31, md on 5/23   Authorization Type [employer] Medicare   Authorization Time Period $25 copay; before 10th visit   Authorization - Visit Number 4   Authorization - Number of Visits 10   OT Start Time [phone]   OT Stop Time 0908   OT Time Calculation (min) 52 min   Activity Tolerance Patient tolerated treatment well   Behavior During Therapy [facility] for tasks assessed/performed      Past Medical History:  Diagnosis Date  . Ankle pain, left   . Arthritis of knee, degenerative   . Asthma   . Carpal tunnel syndrome   . Cervical radiculopathy   . Chronic back pain   . Complete rupture of rotator cuff   . Complete rupture of rotator cuff   . Depression   . Gynecomastia   . HNP (herniated nucleus pulposus), cervical   . HNP (herniated nucleus pulposus), cervical   . Hyperlipidemia   . Knee pain   . OA (osteoarthritis) of knee   . Obesity   . Urinary incontinence     Past Surgical History:  Procedure Laterality Date  . arthroscopy  left knee  08/14/2008   Dr. Aline Brochure   . COLONOSCOPY N/A 03/11/2014   Procedure: COLONOSCOPY;  Surgeon: Rogene Houston, MD;  [facility];  Service: Endoscopy;  Laterality: N/A;  730 - moved to 1/13 @ 12:00  . PARTIAL HYSTERECTOMY    . tube insert in right ear  2003    There were no vitals filed for this visit.      Subjective Assessment - 07/14/16 0846    Subjective  S: Last night was a bad night. I didn't sleep much.   Currently in Pain? Yes   Pain Score 9    Pain Location Shoulder    Pain Orientation Right   Pain Descriptors / Indicators Aching   Pain Type Acute pain            [facility] OT Assessment - 07/14/16 0847      Assessment   Diagnosis Right RCR     Precautions   Precautions Shoulder   Type of Shoulder Precautions Phase I weeks 0-6 (4/13-5/25): P/ROM as follows-140 flexion, 40 er, 60 abduction, pendulums; phase II weeks 6-12 (5/26-7/6): AA/ROM progressing to A/ROM. Phase III after week 12: begin strengthening   Shoulder Interventions Shoulder sling/immobilizer;At all times                  OT Treatments/Exercises (OP) - 07/14/16 0847      Exercises   Exercises Shoulder     Shoulder Exercises: Supine   Protraction PROM;5 reps   Horizontal ABduction PROM;5 reps   External Rotation PROM;5 reps   Internal Rotation PROM;5 reps   Flexion PROM;5 reps   ABduction PROM;5 reps     Shoulder Exercises: Seated   Elevation AROM;10 reps   Extension AROM;10 reps   Row AROM;10 reps     Shoulder Exercises: Therapy Ball   Flexion 10 reps  ABduction 10 reps     Modalities   Modalities Electrical Stimulation;Cryotherapy     Cryotherapy   Number Minutes Cryotherapy 10 Minutes   Cryotherapy Location Shoulder   Type of Cryotherapy Ice pack     Electrical Stimulation   Electrical Stimulation Location right shoulder   Electrical Stimulation Action interferential   Electrical Stimulation Parameters 12.o CV   Electrical Stimulation Goals Pain     Manual Therapy   Manual Therapy Myofascial release   Manual therapy comments manual therapy completed seperately from all other interventions this date of service   Myofascial Release myofascial release and manual stretching  to right upper arm, scapular, trapezius and shoulder and right side cervical region to decrease pain and improve pain freem mobility in right shoulder region                   OT Short Term Goals - 07/05/16 1205      OT SHORT TERM GOAL #1   Title Pt will be provided  with HEP to improve RUE use as dominant during daily tasks.    Time 4   Period Weeks   Status On-going     OT SHORT TERM GOAL #2   Title Pt will decrease RUE pain to 4/10 to improve ability to use RUE as assist during dressing tasks.    Time 4   Period Weeks   Status On-going     OT SHORT TERM GOAL #3   Title Pt will decrease RUE fascial restrictions from mod to min amounts to improve mobility required for functional reaching.    Time 4   Period Weeks   Status On-going     OT SHORT TERM GOAL #4   Title Pt will improve RUE P/ROM to [facility] to improve ability to use RUE as assist during ADL completion.    Time 4   Period Weeks   Status On-going     OT SHORT TERM GOAL #5   Title Pt will improve RUE strength to 3/5 to improve ability to reach items at waist level.    Time 4   Period Weeks   Status On-going           OT Long Term Goals - 07/05/16 1205      OT LONG TERM GOAL #1   Title Pt will return to prior level of functioning and independence using RUE as dominant during daily tasks.    Time 8   Period Weeks   Status On-going     OT LONG TERM GOAL #2   Title Pt will decrease pain in RUE to 3/10 or less to improve use of RUE during grooming tasks.    Time 8   Period Weeks   Status On-going     OT LONG TERM GOAL #3   Title Pt will decrease RUE fascial restrictions from min to trace amounts to improve mobility required for reaching overhead shelves.    Time 8   Period Weeks   Status On-going     OT LONG TERM GOAL #4   Title Pt will improve A/ROM to [facility] to improve ability to hang decorations in her house.    Time 8   Period Weeks   Status On-going     OT LONG TERM GOAL #5   Title Pt will improve RUE strength to 4/5 to improve ability to use RUE as dominant during cooking and baking tasks.    Time 8   Period Weeks  Status On-going               Plan - 07/14/16 0930    Clinical Impression Statement A: Pt arrived to session with increased pain. ES was  utilized for pain management in addition to ice. Pt reports a decreased pain score of 5/10. Did not complete low level thumb tasks due to pain level and time constraint. VC for form and technique as needed.   Plan P: Add low level thumb tacks.      Patient will benefit from skilled therapeutic intervention in order to improve the following deficits and impairments:  Decreased activity tolerance, Decreased strength, Decreased range of motion, Pain, Impaired UE functional use, Increased fascial restricitons, Impaired flexibility  Visit Diagnosis: Other symptoms and signs involving the musculoskeletal system  Acute pain of right shoulder    Problem List Patient Active Problem List   Diagnosis Date Noted  . Guaiac positive stools 12/30/2013  . GERD 03/25/2010  . GUAIAC POSITIVE STOOL 03/25/2010  . NECK PAIN, CHRONIC 03/25/2010  . HEADACHE 12/30/2009  . IMPAIRED FASTING GLUCOSE 12/30/2009  . ROM 11/26/2009  . TINEA CRURIS 06/29/2009  . CANDIDIASIS 06/29/2009  . ECZEMA 03/17/2009  . CARPAL TUNNEL SYNDROME 12/15/2008  . INSOMNIA 09/13/2008  . BACK PAIN, LUMBAR, WITH RADICULOPATHY 09/09/2008  . HYPERLIPIDEMIA 08/09/2008  . FOOT PAIN, BILATERAL 08/04/2008  . KNEE, ARTHRITIS, DEGEN./OSTEO 07/29/2008  . FATIGUE 05/18/2008  . ALLERGIC RHINITIS 10/17/2007  . SHOULDER PAIN, BILATERAL 10/17/2007  . CERVICAL RADICULOPATHY 10/07/2007  . ANKLE PAIN, LEFT 08/19/2007  . OBESITY, UNSPECIFIED 06/25/2007  . DEPRESSION 06/25/2007  . ASTHMA, UNSPECIFIED, UNSPECIFIED STATUS 06/25/2007  . GYNECOMASTIA 06/25/2007  . URINARY INCONTINENCE 06/25/2007    Ailene Ravel, OTR/L,CBIS  [phone]   07/14/2016, 9:35 AM  [place], [name], [address] Phone: [phone]   Fax:  [phone]  Name: COURTLYN AKI MRN: [id number] Date of Birth: 29-Dec-1965

## 2016-07-19 ENCOUNTER — Encounter (HOSPITAL_COMMUNITY): Payer: Medicare Other

## 2016-07-19 ENCOUNTER — Encounter (HOSPITAL_COMMUNITY): Payer: Self-pay

## 2016-07-19 ENCOUNTER — Ambulatory Visit (HOSPITAL_COMMUNITY): Payer: Medicare Other

## 2016-07-19 DIAGNOSIS — M25511 Pain in right shoulder: Secondary | ICD-10-CM | POA: Diagnosis not present

## 2016-07-19 DIAGNOSIS — R29898 Other symptoms and signs involving the musculoskeletal system: Secondary | ICD-10-CM

## 2016-07-19 NOTE — Therapy (Signed)
[place], [name], [address] Phone: [phone]   Fax:  [phone]  Occupational Therapy Treatment  Patient Details  Name: Jessica Stevens MRN: [id number] Date of Birth: November 15, 1965 Referring Provider: Dr. Remo Lipps Case  Encounter Date: 07/19/2016      OT End of Session - 07/19/16 1503    Visit Number 5   Number of Visits 16   Date for OT Re-Evaluation 08/27/16  mini reassess on 5/31   Authorization Type [employer] Medicare   Authorization Time Period $25 copay; before 10th visit   Authorization - Visit Number 5   Authorization - Number of Visits 10   OT Start Time 9450   OT Stop Time 1430   OT Time Calculation (min) 38 min   Activity Tolerance Patient tolerated treatment well   Behavior During Therapy [facility] for tasks assessed/performed      Past Medical History:  Diagnosis Date  . Ankle pain, left   . Arthritis of knee, degenerative   . Asthma   . Carpal tunnel syndrome   . Cervical radiculopathy   . Chronic back pain   . Complete rupture of rotator cuff   . Complete rupture of rotator cuff   . Depression   . Gynecomastia   . HNP (herniated nucleus pulposus), cervical   . HNP (herniated nucleus pulposus), cervical   . Hyperlipidemia   . Knee pain   . OA (osteoarthritis) of knee   . Obesity   . Urinary incontinence     Past Surgical History:  Procedure Laterality Date  . arthroscopy  left knee  08/14/2008   Dr. Aline Brochure   . COLONOSCOPY N/A 03/11/2014   Procedure: COLONOSCOPY;  Surgeon: Rogene Houston, MD;  [facility];  Service: Endoscopy;  Laterality: N/A;  730 - moved to 1/13 @ 12:00  . PARTIAL HYSTERECTOMY    . tube insert in right ear  2003    There were no vitals filed for this visit.      Subjective Assessment - 07/19/16 1459    Subjective  S: I don't have to wear the sling any more unless I'm in public.   Currently in Pain? Yes   Pain Score 6    Pain Location Shoulder    Pain Orientation Right   Pain Descriptors / Indicators Aching   Pain Type Acute pain   Pain Radiating Towards elbow   Pain Onset 1 to 4 weeks ago   Pain Frequency Occasional   Aggravating Factors  wrong positions   Pain Relieving Factors ice, medication   Effect of Pain on Daily Activities unable to use RUE for daily tasks            [facility] OT Assessment - 07/19/16 1501      Assessment   Diagnosis Right RCR     Precautions   Precautions Shoulder   Type of Shoulder Precautions Phase II weeks 6-12 (5/26-7/6): AA/ROM progressing to A/ROM. Phase III after week 12: begin strengthening                  OT Treatments/Exercises (OP) - 07/19/16 1413      Exercises   Exercises Shoulder     Shoulder Exercises: Supine   Protraction PROM;5 reps;AAROM;10 reps   Horizontal ABduction PROM;5 reps;AAROM;10 reps   External Rotation PROM;5 reps;AAROM;10 reps   Internal Rotation PROM;5 reps;AAROM;10 reps   Flexion PROM;5 reps;AAROM;10 reps   ABduction PROM;5 reps;AAROM;10 reps  Manual Therapy   Manual Therapy Myofascial release   Manual therapy comments manual therapy completed seperately from all other interventions this date of service   Myofascial Release myofascial release and manual stretching  to right upper arm, scapular, trapezius and shoulder and right side cervical region to decrease pain and improve pain freem mobility in right shoulder region                   OT Short Term Goals - 07/05/16 1205      OT SHORT TERM GOAL #1   Title Pt will be provided with HEP to improve RUE use as dominant during daily tasks.    Time 4   Period Weeks   Status On-going     OT SHORT TERM GOAL #2   Title Pt will decrease RUE pain to 4/10 to improve ability to use RUE as assist during dressing tasks.    Time 4   Period Weeks   Status On-going     OT SHORT TERM GOAL #3   Title Pt will decrease RUE fascial restrictions from mod to min amounts to improve mobility  required for functional reaching.    Time 4   Period Weeks   Status On-going     OT SHORT TERM GOAL #4   Title Pt will improve RUE P/ROM to [facility] to improve ability to use RUE as assist during ADL completion.    Time 4   Period Weeks   Status On-going     OT SHORT TERM GOAL #5   Title Pt will improve RUE strength to 3/5 to improve ability to reach items at waist level.    Time 4   Period Weeks   Status On-going           OT Long Term Goals - 07/05/16 1205      OT LONG TERM GOAL #1   Title Pt will return to prior level of functioning and independence using RUE as dominant during daily tasks.    Time 8   Period Weeks   Status On-going     OT LONG TERM GOAL #2   Title Pt will decrease pain in RUE to 3/10 or less to improve use of RUE during grooming tasks.    Time 8   Period Weeks   Status On-going     OT LONG TERM GOAL #3   Title Pt will decrease RUE fascial restrictions from min to trace amounts to improve mobility required for reaching overhead shelves.    Time 8   Period Weeks   Status On-going     OT LONG TERM GOAL #4   Title Pt will improve A/ROM to [facility] to improve ability to hang decorations in her house.    Time 8   Period Weeks   Status On-going     OT LONG TERM GOAL #5   Title Pt will improve RUE strength to 4/5 to improve ability to use RUE as dominant during cooking and baking tasks.    Time 8   Period Weeks   Status On-going               Plan - 07/19/16 1503    Clinical Impression Statement A: Patient arrives to session after MD appointment this date. Patient is no longer needing to wear sling unless she is in public. Dr. order states that she is able to progress to AA/ROM for the next 5 weeks. Pt was able to tolerate a greater ROM  this date during passive stretching.    Plan P: Add seated AA/ROM and add to HEP. Add pulleys and thumb tacks      Patient will benefit from skilled therapeutic intervention in order to improve the following  deficits and impairments:  Decreased activity tolerance, Decreased strength, Decreased range of motion, Pain, Impaired UE functional use, Increased fascial restricitons, Impaired flexibility  Visit Diagnosis: Other symptoms and signs involving the musculoskeletal system  Acute pain of right shoulder    Problem List Patient Active Problem List   Diagnosis Date Noted  . Guaiac positive stools 12/30/2013  . GERD 03/25/2010  . GUAIAC POSITIVE STOOL 03/25/2010  . NECK PAIN, CHRONIC 03/25/2010  . HEADACHE 12/30/2009  . IMPAIRED FASTING GLUCOSE 12/30/2009  . ROM 11/26/2009  . TINEA CRURIS 06/29/2009  . CANDIDIASIS 06/29/2009  . ECZEMA 03/17/2009  . CARPAL TUNNEL SYNDROME 12/15/2008  . INSOMNIA 09/13/2008  . BACK PAIN, LUMBAR, WITH RADICULOPATHY 09/09/2008  . HYPERLIPIDEMIA 08/09/2008  . FOOT PAIN, BILATERAL 08/04/2008  . KNEE, ARTHRITIS, DEGEN./OSTEO 07/29/2008  . FATIGUE 05/18/2008  . ALLERGIC RHINITIS 10/17/2007  . SHOULDER PAIN, BILATERAL 10/17/2007  . CERVICAL RADICULOPATHY 10/07/2007  . ANKLE PAIN, LEFT 08/19/2007  . OBESITY, UNSPECIFIED 06/25/2007  . DEPRESSION 06/25/2007  . ASTHMA, UNSPECIFIED, UNSPECIFIED STATUS 06/25/2007  . GYNECOMASTIA 06/25/2007  . URINARY INCONTINENCE 06/25/2007   Ailene Ravel, OTR/L,CBIS  [phone]  07/19/2016, 3:09 PM  [place] [address] [place], [name], [address] Phone: [phone]   Fax:  [phone]  Name: Jessica Stevens MRN: [id number] Date of Birth: 1965-05-10

## 2016-07-21 ENCOUNTER — Ambulatory Visit (HOSPITAL_COMMUNITY): Payer: Medicare Other

## 2016-07-25 ENCOUNTER — Telehealth (HOSPITAL_COMMUNITY): Payer: Self-pay

## 2016-07-25 NOTE — Telephone Encounter (Signed)
Pt l/m she could not come to this apptment, she stated something has come up

## 2016-07-26 ENCOUNTER — Ambulatory Visit (HOSPITAL_COMMUNITY): Payer: Medicare Other

## 2016-07-28 ENCOUNTER — Ambulatory Visit (HOSPITAL_COMMUNITY): Payer: Medicare Other | Attending: Orthopedic Surgery

## 2016-07-28 ENCOUNTER — Encounter (HOSPITAL_COMMUNITY): Payer: Self-pay

## 2016-07-28 DIAGNOSIS — R29898 Other symptoms and signs involving the musculoskeletal system: Secondary | ICD-10-CM | POA: Insufficient documentation

## 2016-07-28 DIAGNOSIS — M25511 Pain in right shoulder: Secondary | ICD-10-CM

## 2016-07-28 NOTE — Patient Instructions (Signed)
Perform each exercise ____10-15____ reps. 2-3x days.   Protraction - STANDING  Start by holding a wand or cane at chest height.  Next, slowly push the wand outwards in front of your body so that your elbows become fully straightened. Then, return to the original position.     Shoulder FLEXION - STANDING - PALMS DOWn  In the standing position, hold a wand/cane with both arms, palms down on both sides. Raise up the wand/cane allowing your unaffected arm to perform most of the effort. Your affected arm should be partially relaxed.      Internal/External ROTATION - STANDING  In the standing position, hold a wand/cane with both hands keeping your elbows bent. Move your arms and wand/cane to one side.  Your affected arm should be partially relaxed while your unaffected arm performs most of the effort.       Shoulder ABDUCTION - STANDING  While holding a wand/cane palm face up on the injured side and palm face down on the uninjured side, slowly raise up your injured arm to the side.           Horizontal Abduction/Adduction      Straight arms holding cane at shoulder height, bring cane to right, center, left. Repeat starting to left.     Wall Flexion  Slide your arm up the wall until a stretch is felt in your shoulder . Hold for 15-30 seconds. Complete 2 times.

## 2016-07-28 NOTE — Therapy (Addendum)
[facility] [place], [name], [address] Phone: [phone]   Fax:  [phone]  Occupational Therapy Treatment And reassessment Patient Details  Name: Jessica Stevens MRN: [id number] Date of Birth: April 01, 1965 Referring Provider: Dr. Remo Lipps Case  Encounter Date: 07/28/2016      OT End of Session - 07/28/16 0843    Visit Number 6   Number of Visits 16   Date for OT Re-Evaluation 08/27/16  mini reassess on 5/31   Authorization Type [employer] Medicare   Authorization Time Period $25 copay; before 10th visit   Authorization - Visit Number 6   Authorization - Number of Visits 10   OT Start Time 0820   OT Stop Time 0900   OT Time Calculation (min) 40 min   Activity Tolerance Patient tolerated treatment well   Behavior During Therapy [facility] for tasks assessed/performed      Past Medical History:  Diagnosis Date  . Ankle pain, left   . Arthritis of knee, degenerative   . Asthma   . Carpal tunnel syndrome   . Cervical radiculopathy   . Chronic back pain   . Complete rupture of rotator cuff   . Complete rupture of rotator cuff   . Depression   . Gynecomastia   . HNP (herniated nucleus pulposus), cervical   . HNP (herniated nucleus pulposus), cervical   . Hyperlipidemia   . Knee pain   . OA (osteoarthritis) of knee   . Obesity   . Urinary incontinence     Past Surgical History:  Procedure Laterality Date  . arthroscopy  left knee  08/14/2008   Dr. Aline Brochure   . COLONOSCOPY N/A 03/11/2014   Procedure: COLONOSCOPY;  Surgeon: Rogene Houston, MD;  [facility];  Service: Endoscopy;  Laterality: N/A;  730 - moved to 1/13 @ 12:00  . PARTIAL HYSTERECTOMY    . tube insert in right ear  2003    There were no vitals filed for this visit.      Subjective Assessment - 07/28/16 0823    Subjective  S: It only hurts when I move it.   Currently in Pain? No/denies            [facility] OT Assessment - 07/28/16 0826       Assessment   Diagnosis Right RCR   Onset Date 06/09/16     Precautions   Precautions Shoulder   Type of Shoulder Precautions Phase II weeks 6-12 (5/26-7/6): AA/ROM progressing to A/ROM. Phase III after week 12: begin strengthening     Prior Function   Level of Independence Independent     AROM   Overall AROM Comments Assessed seated. A/ROM not assessed prior to this date. IR/er adducted.   AROM Assessment Site Shoulder   Right/Left Shoulder Right   Right Shoulder Flexion 90 Degrees   Right Shoulder ABduction 100 Degrees   Right Shoulder Internal Rotation 90 Degrees   Right Shoulder External Rotation 13 Degrees     PROM   Overall PROM Comments Assessed supine, er/IR adducted   PROM Assessment Site Shoulder   Right/Left Shoulder Right   Right Shoulder Flexion 132 Degrees  previous: same   Right Shoulder ABduction 140 Degrees  previous: 72   Right Shoulder Internal Rotation 90 Degrees  previous: same   Right Shoulder External Rotation 40 Degrees  previous: 30     Strength   Overall Strength Comments Assessed seated. IR/er adducted. Strength not assessed prior to  this date.    Strength Assessment Site Shoulder   Right/Left Shoulder Right   Right Shoulder Flexion 3-/5   Right Shoulder ABduction 3-/5   Right Shoulder Internal Rotation 3/5   Right Shoulder External Rotation 3-/5                  OT Treatments/Exercises (OP) - 07/28/16 0835      Exercises   Exercises Shoulder     Shoulder Exercises: Supine   Protraction PROM;5 reps   Horizontal ABduction PROM;5 reps   External Rotation PROM;5 reps   Internal Rotation PROM;5 reps   Flexion PROM;5 reps   ABduction PROM;5 reps     Shoulder Exercises: Seated   Protraction AAROM;10 reps   Horizontal ABduction AAROM;10 reps   External Rotation AAROM;10 reps   Internal Rotation AAROM;10 reps   Flexion AAROM;10 reps   Abduction AAROM;10 reps     Shoulder Exercises: Pulleys   Flexion 1 minute    ABduction 1 minute     Shoulder Exercises: ROM/Strengthening   Wall Wash 1'     Shoulder Exercises: Stretch   Wall Stretch - Flexion 2 reps  15 seconds     Manual Therapy   Manual Therapy Myofascial release;Muscle Energy Technique   Manual therapy comments manual therapy completed seperately from all other interventions this date of service   Myofascial Release myofascial release and manual stretching  to right upper arm, scapular, trapezius and shoulder and right side cervical region to decrease pain and improve pain freem mobility in right shoulder region    Muscle Energy Technique Muscle energy therapy technique used with right anterior deltoid to relax tone and muscle spasm and improve range of motion.                OT Education - 07/28/16 0840    Education provided Yes   Education Details AA/ROM shoulder exercises and shoulder flexion stretch   Person(s) Educated Patient   Methods Explanation;Demonstration;Handout;Verbal cues   Comprehension Returned demonstration;Verbalized understanding          OT Short Term Goals - 07/28/16 0854      OT SHORT TERM GOAL #1   Title Pt will be provided with HEP to improve RUE use as dominant during daily tasks.    Time 4   Period Weeks   Status Achieved     OT SHORT TERM GOAL #2   Title Pt will decrease RUE pain to 4/10 to improve ability to use RUE as assist during dressing tasks.    Time 4   Period Weeks   Status On-going     OT SHORT TERM GOAL #3   Title Pt will decrease RUE fascial restrictions from mod to min amounts to improve mobility required for functional reaching.    Time 4   Period Weeks   Status Achieved     OT SHORT TERM GOAL #4   Title Pt will improve RUE P/ROM to [facility] to improve ability to use RUE as assist during ADL completion.    Time 4   Period Weeks   Status Achieved     OT SHORT TERM GOAL #5   Title Pt will improve RUE strength to 3/5 to improve ability to reach items at waist level.    Time  4   Period Weeks   Status Partially Met           OT Long Term Goals - 07/05/16 1205      OT LONG TERM  GOAL #1   Title Pt will return to prior level of functioning and independence using RUE as dominant during daily tasks.    Time 8   Period Weeks   Status On-going     OT LONG TERM GOAL #2   Title Pt will decrease pain in RUE to 3/10 or less to improve use of RUE during grooming tasks.    Time 8   Period Weeks   Status On-going     OT LONG TERM GOAL #3   Title Pt will decrease RUE fascial restrictions from min to trace amounts to improve mobility required for reaching overhead shelves.    Time 8   Period Weeks   Status On-going     OT LONG TERM GOAL #4   Title Pt will improve A/ROM to [facility] to improve ability to hang decorations in her house.    Time 8   Period Weeks   Status On-going     OT LONG TERM GOAL #5   Title Pt will improve RUE strength to 4/5 to improve ability to use RUE as dominant during cooking and baking tasks.    Time 8   Period Weeks   Status On-going               Plan - 07/28/16 4497    Clinical Impression Statement A: Reassessment completed today. Patient has met 3/5 STGs with another goal partially met.  Patient reports that she typically doesn't have pain unless she is trying to reach and use her arm. Pt has progressed to AA/ROM seated and HEP was updated.    Plan P: Follow up on HEP. Continue with AA/ROM progressing to A/ROM when able. Add proximal shoulder strenthening and pvc pipe slide.      Patient will benefit from skilled therapeutic intervention in order to improve the following deficits and impairments:  Decreased activity tolerance, Decreased strength, Decreased range of motion, Pain, Impaired UE functional use, Increased fascial restricitons, Impaired flexibility  Visit Diagnosis: Other symptoms and signs involving the musculoskeletal system  Acute pain of right shoulder    Problem List Patient Active Problem List    Diagnosis Date Noted  . Guaiac positive stools 12/30/2013  . GERD 03/25/2010  . GUAIAC POSITIVE STOOL 03/25/2010  . NECK PAIN, CHRONIC 03/25/2010  . HEADACHE 12/30/2009  . IMPAIRED FASTING GLUCOSE 12/30/2009  . ROM 11/26/2009  . TINEA CRURIS 06/29/2009  . CANDIDIASIS 06/29/2009  . ECZEMA 03/17/2009  . CARPAL TUNNEL SYNDROME 12/15/2008  . INSOMNIA 09/13/2008  . BACK PAIN, LUMBAR, WITH RADICULOPATHY 09/09/2008  . HYPERLIPIDEMIA 08/09/2008  . FOOT PAIN, BILATERAL 08/04/2008  . KNEE, ARTHRITIS, DEGEN./OSTEO 07/29/2008  . FATIGUE 05/18/2008  . ALLERGIC RHINITIS 10/17/2007  . SHOULDER PAIN, BILATERAL 10/17/2007  . CERVICAL RADICULOPATHY 10/07/2007  . ANKLE PAIN, LEFT 08/19/2007  . OBESITY, UNSPECIFIED 06/25/2007  . DEPRESSION 06/25/2007  . ASTHMA, UNSPECIFIED, UNSPECIFIED STATUS 06/25/2007  . GYNECOMASTIA 06/25/2007  . URINARY INCONTINENCE 06/25/2007   Ailene Ravel, OTR/L,CBIS  [phone]  07/28/2016, 9:02 AM  [facility] [address] [place] [facility] [place], [name], [address] Phone: [phone]   Fax:  [phone]  Name: CLOTILDE LOTH MRN: [id number] Date of Birth: [birth year]

## 2016-08-02 ENCOUNTER — Ambulatory Visit (HOSPITAL_COMMUNITY): Payer: Medicare Other

## 2016-08-02 ENCOUNTER — Encounter (HOSPITAL_COMMUNITY): Payer: Medicare Other

## 2016-08-02 ENCOUNTER — Encounter (HOSPITAL_COMMUNITY): Payer: Self-pay

## 2016-08-02 DIAGNOSIS — R7301 Impaired fasting glucose: Secondary | ICD-10-CM | POA: Diagnosis not present

## 2016-08-02 DIAGNOSIS — E039 Hypothyroidism, unspecified: Secondary | ICD-10-CM | POA: Diagnosis not present

## 2016-08-02 DIAGNOSIS — R29898 Other symptoms and signs involving the musculoskeletal system: Secondary | ICD-10-CM | POA: Diagnosis not present

## 2016-08-02 DIAGNOSIS — M25511 Pain in right shoulder: Secondary | ICD-10-CM | POA: Diagnosis not present

## 2016-08-02 DIAGNOSIS — I10 Essential (primary) hypertension: Secondary | ICD-10-CM | POA: Diagnosis not present

## 2016-08-02 DIAGNOSIS — D509 Iron deficiency anemia, unspecified: Secondary | ICD-10-CM | POA: Diagnosis not present

## 2016-08-02 NOTE — Therapy (Signed)
[facility] [place], [name], [address] Phone: [phone]   Fax:  [phone]  Occupational Therapy Treatment  Patient Details  Name: Jessica Stevens MRN: [id number] Date of Birth: 25-Jun-1965 Referring Provider: Dr. Remo Lipps Case  Encounter Date: 08/02/2016      OT End of Session - 08/02/16 1007    Visit Number 7   Number of Visits 16   Date for OT Re-Evaluation 08/27/16   Authorization Type [employer] Medicare   Authorization Time Period $25 copay; before 10th visit   Authorization - Visit Number 7   Authorization - Number of Visits 10   OT Start Time 0820   OT Stop Time 0901   OT Time Calculation (min) 41 min   Activity Tolerance Patient limited by pain   Behavior During Therapy [facility] for tasks assessed/performed      Past Medical History:  Diagnosis Date  . Ankle pain, left   . Arthritis of knee, degenerative   . Asthma   . Carpal tunnel syndrome   . Cervical radiculopathy   . Chronic back pain   . Complete rupture of rotator cuff   . Complete rupture of rotator cuff   . Depression   . Gynecomastia   . HNP (herniated nucleus pulposus), cervical   . HNP (herniated nucleus pulposus), cervical   . Hyperlipidemia   . Knee pain   . OA (osteoarthritis) of knee   . Obesity   . Urinary incontinence     Past Surgical History:  Procedure Laterality Date  . arthroscopy  left knee  08/14/2008   Dr. Aline Brochure   . COLONOSCOPY N/A 03/11/2014   Procedure: COLONOSCOPY;  Surgeon: Rogene Houston, MD;  [facility];  Service: Endoscopy;  Laterality: N/A;  730 - moved to 1/13 @ 12:00  . PARTIAL HYSTERECTOMY    . tube insert in right ear  2003    There were no vitals filed for this visit.      Subjective Assessment - 08/02/16 0822    Subjective  S: My arm hurts bad today. It gave me a fit after my exercises last night.   Currently in Pain? Yes   Pain Score 8    Pain Location Shoulder   Pain Orientation Right    Pain Descriptors / Indicators Constant;Throbbing;Sore   Pain Type Acute pain   Pain Radiating Towards N/A   Pain Onset More than a month ago   Pain Frequency Constant   Aggravating Factors  abduction exercise   Pain Relieving Factors ice   Effect of Pain on Daily Activities minimum   Multiple Pain Sites No            [facility] OT Assessment - 08/02/16 1014      Assessment   Diagnosis Right RCR     Precautions   Precautions Shoulder   Type of Shoulder Precautions Phase II weeks 6-12 (5/26-7/6): AA/ROM progressing to A/ROM. Phase III after week 12: begin strengthening                  OT Treatments/Exercises (OP) - 08/02/16 0825      Exercises   Exercises Shoulder     Shoulder Exercises: Supine   Protraction PROM;5 reps;AAROM;10 reps   External Rotation PROM;5 reps;AAROM;10 reps   Internal Rotation PROM;5 reps;AAROM;10 reps   Flexion PROM;5 reps;AAROM;10 reps     Shoulder Exercises: ROM/Strengthening   Other ROM/Strengthening Exercises PVC slide 10X     Modalities  Modalities Ultrasound     Ultrasound   Ultrasound Location Right shoulder   Ultrasound Parameters 1.5W/cm2 for 5'   Ultrasound Goals Pain     Manual Therapy   Manual Therapy Myofascial release;Muscle Energy Technique   Manual therapy comments manual therapy completed seperately from all other interventions this date of service   Myofascial Release myofascial release and manual stretching  to right upper arm, scapular, trapezius and shoulder and right side cervical region to decrease pain and improve pain freem mobility in right shoulder region                 OT Education - 08/02/16 1006    Education provided Yes   Education Details Educated pt to take a break today from HEP due to increased pain   Person(s) Educated Patient   Methods Explanation   Comprehension Verbalized understanding          OT Short Term Goals - 08/02/16 1101      OT SHORT TERM GOAL #1   Title Pt  will be provided with HEP to improve RUE use as dominant during daily tasks.    Time 4   Period Weeks     OT SHORT TERM GOAL #2   Title Pt will decrease RUE pain to 4/10 to improve ability to use RUE as assist during dressing tasks.    Time 4   Period Weeks   Status On-going     OT SHORT TERM GOAL #3   Title Pt will decrease RUE fascial restrictions from mod to min amounts to improve mobility required for functional reaching.    Time 4   Period Weeks     OT SHORT TERM GOAL #4   Title Pt will improve RUE P/ROM to [facility] to improve ability to use RUE as assist during ADL completion.    Time 4   Period Weeks     OT SHORT TERM GOAL #5   Title Pt will improve RUE strength to 3/5 to improve ability to reach items at waist level.    Time 4   Period Weeks   Status Partially Met           OT Long Term Goals - 07/05/16 1205      OT LONG TERM GOAL #1   Title Pt will return to prior level of functioning and independence using RUE as dominant during daily tasks.    Time 8   Period Weeks   Status On-going     OT LONG TERM GOAL #2   Title Pt will decrease pain in RUE to 3/10 or less to improve use of RUE during grooming tasks.    Time 8   Period Weeks   Status On-going     OT LONG TERM GOAL #3   Title Pt will decrease RUE fascial restrictions from min to trace amounts to improve mobility required for reaching overhead shelves.    Time 8   Period Weeks   Status On-going     OT LONG TERM GOAL #4   Title Pt will improve A/ROM to [facility] to improve ability to hang decorations in her house.    Time 8   Period Weeks   Status On-going     OT LONG TERM GOAL #5   Title Pt will improve RUE strength to 4/5 to improve ability to use RUE as dominant during cooking and baking tasks.    Time 8   Period Weeks   Status On-going  Plan - 08/02/16 1007    Clinical Impression Statement A: Pt came in with increased pain due to abduction exercises with HEP. Completed P/ROM  with needed rest breaks due to pain. Ultrasound was used before AA/ROM to attempt a decrease of pain to facilitate better ROM. Completed PVC pipe slides with VC needed for form and technique. Pt finished session with decreased pain to 5/10.   Plan P: Follow up on HEP to see if it is still causing pain. Continue with AA/ROM and progress to seated. Discuss with pt when she sees the doctor next to address pain.      Patient will benefit from skilled therapeutic intervention in order to improve the following deficits and impairments:  Decreased activity tolerance, Decreased strength, Decreased range of motion, Pain, Impaired UE functional use, Increased fascial restricitons, Impaired flexibility  Visit Diagnosis: Other symptoms and signs involving the musculoskeletal system  Acute pain of right shoulder    Problem List Patient Active Problem List   Diagnosis Date Noted  . Guaiac positive stools 12/30/2013  . GERD 03/25/2010  . GUAIAC POSITIVE STOOL 03/25/2010  . NECK PAIN, CHRONIC 03/25/2010  . HEADACHE 12/30/2009  . IMPAIRED FASTING GLUCOSE 12/30/2009  . ROM 11/26/2009  . TINEA CRURIS 06/29/2009  . CANDIDIASIS 06/29/2009  . ECZEMA 03/17/2009  . CARPAL TUNNEL SYNDROME 12/15/2008  . INSOMNIA 09/13/2008  . BACK PAIN, LUMBAR, WITH RADICULOPATHY 09/09/2008  . HYPERLIPIDEMIA 08/09/2008  . FOOT PAIN, BILATERAL 08/04/2008  . KNEE, ARTHRITIS, DEGEN./OSTEO 07/29/2008  . FATIGUE 05/18/2008  . ALLERGIC RHINITIS 10/17/2007  . SHOULDER PAIN, BILATERAL 10/17/2007  . CERVICAL RADICULOPATHY 10/07/2007  . ANKLE PAIN, LEFT 08/19/2007  . OBESITY, UNSPECIFIED 06/25/2007  . DEPRESSION 06/25/2007  . ASTHMA, UNSPECIFIED, UNSPECIFIED STATUS 06/25/2007  . GYNECOMASTIA 06/25/2007  . URINARY INCONTINENCE 06/25/2007   Ailene Ravel, OTR/L,CBIS  [phone]  08/02/2016, 11:02 AM  [facility] [address] [place], [name], [address] Phone: [phone]    Fax:  [phone]  Name: NITIKA JACKOWSKI MRN: [id number] Date of Birth: 1965/09/17

## 2016-08-04 ENCOUNTER — Ambulatory Visit (HOSPITAL_COMMUNITY): Payer: Medicare Other

## 2016-08-04 ENCOUNTER — Encounter (HOSPITAL_COMMUNITY): Payer: Self-pay

## 2016-08-04 DIAGNOSIS — E039 Hypothyroidism, unspecified: Secondary | ICD-10-CM | POA: Diagnosis not present

## 2016-08-04 DIAGNOSIS — M25511 Pain in right shoulder: Secondary | ICD-10-CM

## 2016-08-04 DIAGNOSIS — K219 Gastro-esophageal reflux disease without esophagitis: Secondary | ICD-10-CM | POA: Diagnosis not present

## 2016-08-04 DIAGNOSIS — G2581 Restless legs syndrome: Secondary | ICD-10-CM | POA: Diagnosis not present

## 2016-08-04 DIAGNOSIS — E782 Mixed hyperlipidemia: Secondary | ICD-10-CM | POA: Diagnosis not present

## 2016-08-04 DIAGNOSIS — R29898 Other symptoms and signs involving the musculoskeletal system: Secondary | ICD-10-CM

## 2016-08-04 NOTE — Therapy (Signed)
[place], [name], [address] Phone: [phone]   Fax:  [phone]  Occupational Therapy Treatment  Patient Details  Name: Jessica Stevens MRN: [id number] Date of Birth: 1966/01/30 Referring Provider: Dr. Remo Lipps Case  Encounter Date: 08/04/2016      OT End of Session - 08/04/16 0926    Visit Number 8   Number of Visits 16   Date for OT Re-Evaluation 08/27/16   Authorization Type [employer] Medicare   Authorization Time Period $25 copay; before 10th visit   Authorization - Visit Number 8   Authorization - Number of Visits 10   OT Start Time 0820   OT Stop Time 0902   OT Time Calculation (min) 42 min   Activity Tolerance Patient limited by pain   Behavior During Therapy [facility] for tasks assessed/performed      Past Medical History:  Diagnosis Date  . Ankle pain, left   . Arthritis of knee, degenerative   . Asthma   . Carpal tunnel syndrome   . Cervical radiculopathy   . Chronic back pain   . Complete rupture of rotator cuff   . Complete rupture of rotator cuff   . Depression   . Gynecomastia   . HNP (herniated nucleus pulposus), cervical   . HNP (herniated nucleus pulposus), cervical   . Hyperlipidemia   . Knee pain   . OA (osteoarthritis) of knee   . Obesity   . Urinary incontinence     Past Surgical History:  Procedure Laterality Date  . arthroscopy  left knee  08/14/2008   Dr. Aline Brochure   . COLONOSCOPY N/A 03/11/2014   Procedure: COLONOSCOPY;  Surgeon: Rogene Houston, MD;  [facility];  Service: Endoscopy;  Laterality: N/A;  730 - moved to 1/13 @ 12:00  . PARTIAL HYSTERECTOMY    . tube insert in right ear  2003    There were no vitals filed for this visit.      Subjective Assessment - 08/04/16 0823    Subjective  S: I'm feeling a lot better, just some pain towards my neck.   Currently in Pain? Yes   Pain Score 4    Pain Location Shoulder   Pain Orientation Right   Pain  Descriptors / Indicators Constant;Aching   Pain Type Acute pain   Pain Radiating Towards N/   Pain Onset More than a month ago   Pain Frequency Constant   Aggravating Factors  sleeping position   Pain Relieving Factors ice   Effect of Pain on Daily Activities minimum   Multiple Pain Sites No            [facility] OT Assessment - 08/04/16 0825      Assessment   Diagnosis Right RCR     Precautions   Precautions Shoulder   Type of Shoulder Precautions Phase II weeks 6-12 (5/26-7/6): AA/ROM progressing to A/ROM. Phase III after week 12: begin strengthening                  OT Treatments/Exercises (OP) - 08/04/16 0825      Exercises   Exercises Shoulder     Shoulder Exercises: Seated   Protraction PROM;5 reps;AAROM;12 reps   External Rotation PROM;5 reps;AAROM;12 reps   Internal Rotation PROM;5 reps;AAROM;12 reps   Flexion PROM;5 reps;AAROM;12 reps   Abduction PROM;5 reps;AAROM;12 reps   Other Seated Exercises Horizontal adduction P/ROM 5X     Shoulder Exercises: ROM/Strengthening  UBE (Upper Arm Bike) Level 1 1' forward, 1' backward   Wall Wash 1'   Thumb Tacks 1'   Other ROM/Strengthening Exercises PVC slide 12X     Shoulder Exercises: Stretch   Wall Stretch - Flexion 2 reps;10 seconds     Manual Therapy   Manual Therapy Myofascial release   Manual therapy comments manual therapy completed seperately from all other interventions this date of service   Myofascial Release myofascial release and manual stretching  to right upper arm, scapular, trapezius and shoulder and right side cervical region to decrease pain and improve pain freem mobility in right shoulder region                 OT Education - 08/04/16 0914    Education provided Yes   Education Details Educated pt that it was okay to use arm bike on low level for less than 5' if no pain   Person(s) Educated Patient   Methods Explanation;Verbal cues   Comprehension Verbalized understanding           OT Short Term Goals - 08/02/16 1101      OT SHORT TERM GOAL #1   Title Pt will be provided with HEP to improve RUE use as dominant during daily tasks.    Time 4   Period Weeks     OT SHORT TERM GOAL #2   Title Pt will decrease RUE pain to 4/10 to improve ability to use RUE as assist during dressing tasks.    Time 4   Period Weeks   Status On-going     OT SHORT TERM GOAL #3   Title Pt will decrease RUE fascial restrictions from mod to min amounts to improve mobility required for functional reaching.    Time 4   Period Weeks     OT SHORT TERM GOAL #4   Title Pt will improve RUE P/ROM to [facility] to improve ability to use RUE as assist during ADL completion.    Time 4   Period Weeks     OT SHORT TERM GOAL #5   Title Pt will improve RUE strength to 3/5 to improve ability to reach items at waist level.    Time 4   Period Weeks   Status Partially Met           OT Long Term Goals - 07/05/16 1205      OT LONG TERM GOAL #1   Title Pt will return to prior level of functioning and independence using RUE as dominant during daily tasks.    Time 8   Period Weeks   Status On-going     OT LONG TERM GOAL #2   Title Pt will decrease pain in RUE to 3/10 or less to improve use of RUE during grooming tasks.    Time 8   Period Weeks   Status On-going     OT LONG TERM GOAL #3   Title Pt will decrease RUE fascial restrictions from min to trace amounts to improve mobility required for reaching overhead shelves.    Time 8   Period Weeks   Status On-going     OT LONG TERM GOAL #4   Title Pt will improve A/ROM to [facility] to improve ability to hang decorations in her house.    Time 8   Period Weeks   Status On-going     OT LONG TERM GOAL #5   Title Pt will improve RUE strength to 4/5 to improve ability  to use RUE as dominant during cooking and baking tasks.    Time 8   Period Weeks   Status On-going               Plan - 08/04/16 3200    Clinical Impression Statement  A: Pt reports decreased pain and wants to be seated for today's session instead of supine. Manual therapy completed to decrease fascial restrictions. Pt presents with pain during P/ROM due to popping and could not complete horizontal abduction. Completed thumb tacks and wall flexion stretch with VC needed for formand technique. Added arm bike per pt request and educated pt on use at home.   Plan P: Continue AA/ROM seated. Increase arm bike minutes. Progress to A/ROM. Update G code.      Patient will benefit from skilled therapeutic intervention in order to improve the following deficits and impairments:  Decreased activity tolerance, Decreased strength, Decreased range of motion, Pain, Impaired UE functional use, Increased fascial restricitons, Impaired flexibility  Visit Diagnosis: Other symptoms and signs involving the musculoskeletal system  Acute pain of right shoulder    Problem List Patient Active Problem List   Diagnosis Date Noted  . Guaiac positive stools 12/30/2013  . GERD 03/25/2010  . GUAIAC POSITIVE STOOL 03/25/2010  . NECK PAIN, CHRONIC 03/25/2010  . HEADACHE 12/30/2009  . IMPAIRED FASTING GLUCOSE 12/30/2009  . ROM 11/26/2009  . TINEA CRURIS 06/29/2009  . CANDIDIASIS 06/29/2009  . ECZEMA 03/17/2009  . CARPAL TUNNEL SYNDROME 12/15/2008  . INSOMNIA 09/13/2008  . BACK PAIN, LUMBAR, WITH RADICULOPATHY 09/09/2008  . HYPERLIPIDEMIA 08/09/2008  . FOOT PAIN, BILATERAL 08/04/2008  . KNEE, ARTHRITIS, DEGEN./OSTEO 07/29/2008  . FATIGUE 05/18/2008  . ALLERGIC RHINITIS 10/17/2007  . SHOULDER PAIN, BILATERAL 10/17/2007  . CERVICAL RADICULOPATHY 10/07/2007  . ANKLE PAIN, LEFT 08/19/2007  . OBESITY, UNSPECIFIED 06/25/2007  . DEPRESSION 06/25/2007  . ASTHMA, UNSPECIFIED, UNSPECIFIED STATUS 06/25/2007  . GYNECOMASTIA 06/25/2007  . URINARY INCONTINENCE 06/25/2007   Ailene Ravel, OTR/L,CBIS  [phone]  08/04/2016, 9:44 AM  [place] [address] [place], [name], [address] Phone: [phone]   Fax:  [phone]  Name: Jessica Stevens MRN: [id number] Date of Birth: December 12, 1965

## 2016-08-08 DIAGNOSIS — J45909 Unspecified asthma, uncomplicated: Secondary | ICD-10-CM | POA: Diagnosis not present

## 2016-08-08 DIAGNOSIS — W57XXXA Bitten or stung by nonvenomous insect and other nonvenomous arthropods, initial encounter: Secondary | ICD-10-CM | POA: Diagnosis not present

## 2016-08-08 DIAGNOSIS — K219 Gastro-esophageal reflux disease without esophagitis: Secondary | ICD-10-CM | POA: Diagnosis not present

## 2016-08-08 DIAGNOSIS — S40861A Insect bite (nonvenomous) of right upper arm, initial encounter: Secondary | ICD-10-CM | POA: Diagnosis not present

## 2016-08-08 DIAGNOSIS — Z79899 Other long term (current) drug therapy: Secondary | ICD-10-CM | POA: Diagnosis not present

## 2016-08-08 DIAGNOSIS — L258 Unspecified contact dermatitis due to other agents: Secondary | ICD-10-CM | POA: Diagnosis not present

## 2016-08-09 ENCOUNTER — Ambulatory Visit (HOSPITAL_COMMUNITY): Payer: Medicare Other

## 2016-08-09 ENCOUNTER — Encounter (HOSPITAL_COMMUNITY): Payer: Self-pay

## 2016-08-09 DIAGNOSIS — M25511 Pain in right shoulder: Secondary | ICD-10-CM

## 2016-08-09 DIAGNOSIS — R29898 Other symptoms and signs involving the musculoskeletal system: Secondary | ICD-10-CM

## 2016-08-09 NOTE — Therapy (Deleted)
[place], [name], [address] Phone: [phone]   Fax:  [phone]  Occupational Therapy Treatment  Patient Details  Name: Jessica Stevens MRN: [id number] Date of Birth: [birth year] Referring Provider: Dr. Remo Lipps Case  Encounter Date: 08/09/2016      OT End of Session - 08/09/16 0908    Visit Number 9   Number of Visits 16   Date for OT Re-Evaluation 08/27/16   Authorization Type [employer] Medicare   Authorization Time Period $25 copay; before 10th visit   Authorization - Visit Number 9   Authorization - Number of Visits 10   OT Start Time [phone]   OT Stop Time 0901   OT Time Calculation (min) 42 min   Activity Tolerance Patient tolerated treatment well   Behavior During Therapy [facility] for tasks assessed/performed      Past Medical History:  Diagnosis Date  . Ankle pain, left   . Arthritis of knee, degenerative   . Asthma   . Carpal tunnel syndrome   . Cervical radiculopathy   . Chronic back pain   . Complete rupture of rotator cuff   . Complete rupture of rotator cuff   . Depression   . Gynecomastia   . HNP (herniated nucleus pulposus), cervical   . HNP (herniated nucleus pulposus), cervical   . Hyperlipidemia   . Knee pain   . OA (osteoarthritis) of knee   . Obesity   . Urinary incontinence     Past Surgical History:  Procedure Laterality Date  . arthroscopy  left knee  08/14/2008   Dr. Aline Brochure   . COLONOSCOPY N/A 03/11/2014   Procedure: COLONOSCOPY;  Surgeon: Rogene Houston, MD;  [facility];  Service: Endoscopy;  Laterality: N/A;  730 - moved to 1/13 @ 12:00  . PARTIAL HYSTERECTOMY    . tube insert in right ear  2003    There were no vitals filed for this visit.      Subjective Assessment - 08/09/16 0829    Subjective  S: I'm feeling really good today, not having any pain at all.   Special Tests FOTO: 59.44   Currently in Pain? No/denies            [facility] OT  Assessment - 08/09/16 0830      Assessment   Diagnosis Right RCR     Precautions   Precautions Shoulder   Type of Shoulder Precautions Phase II weeks 6-12 (5/26-7/6): AA/ROM progressing to A/ROM. Phase III after week 12: begin strengthening                  OT Treatments/Exercises (OP) - 08/09/16 0830      Exercises   Exercises Shoulder     Shoulder Exercises: Supine   Protraction PROM;AAROM;AROM;5 reps   Horizontal ABduction PROM;AAROM;AROM;5 reps   External Rotation PROM;AAROM;AROM;5 reps   Internal Rotation PROM;AAROM;AROM;5 reps   Flexion PROM;AAROM;AROM;5 reps   ABduction PROM;AAROM;AROM;5 reps     Shoulder Exercises: ROM/Strengthening   Wall Wash 1'   Thumb Tacks 1'   Ball on Wall 1' green weighted ball   Other ROM/Strengthening Exercises PVC slide 15X     Manual Therapy   Manual Therapy Myofascial release   Manual therapy comments manual therapy completed seperately from all other interventions this date of service   Myofascial Release myofascial release and manual stretching  to right upper arm, scapular, trapezius and shoulder and right side cervical region to decrease  pain and improve pain freem mobility in right shoulder region                 OT Education - 08/09/16 0908    Education provided No          OT Short Term Goals - 08/02/16 1101      OT SHORT TERM GOAL #1   Title Pt will be provided with HEP to improve RUE use as dominant during daily tasks.    Time 4   Period Weeks     OT SHORT TERM GOAL #2   Title Pt will decrease RUE pain to 4/10 to improve ability to use RUE as assist during dressing tasks.    Time 4   Period Weeks   Status On-going     OT SHORT TERM GOAL #3   Title Pt will decrease RUE fascial restrictions from mod to min amounts to improve mobility required for functional reaching.    Time 4   Period Weeks     OT SHORT TERM GOAL #4   Title Pt will improve RUE P/ROM to [facility] to improve ability to use RUE as  assist during ADL completion.    Time 4   Period Weeks     OT SHORT TERM GOAL #5   Title Pt will improve RUE strength to 3/5 to improve ability to reach items at waist level.    Time 4   Period Weeks   Status Partially Met           OT Long Term Goals - 07/05/16 1205      OT LONG TERM GOAL #1   Title Pt will return to prior level of functioning and independence using RUE as dominant during daily tasks.    Time 8   Period Weeks   Status On-going     OT LONG TERM GOAL #2   Title Pt will decrease pain in RUE to 3/10 or less to improve use of RUE during grooming tasks.    Time 8   Period Weeks   Status On-going     OT LONG TERM GOAL #3   Title Pt will decrease RUE fascial restrictions from min to trace amounts to improve mobility required for reaching overhead shelves.    Time 8   Period Weeks   Status On-going     OT LONG TERM GOAL #4   Title Pt will improve A/ROM to [facility] to improve ability to hang decorations in her house.    Time 8   Period Weeks   Status On-going     OT LONG TERM GOAL #5   Title Pt will improve RUE strength to 4/5 to improve ability to use RUE as dominant during cooking and baking tasks.    Time 8   Period Weeks   Status On-going               Plan - 08/09/16 3212    Clinical Impression Statement A: Pt reports there is a catch with P/ROM movements causing discomfort in shoulder. Pt has doctors appointment on 08/17/16 and is going to ask the doctor about the catch. In horizontal abduction of the shoulder supine there is a hard end feel similiar to joint limitation during passive stretching. Completed pvc pipe slides, wall wash, thumb tacks and ball on the wal. VC needed for form and technique. Pt reports bathing using RUE is improving and she is now able to pick up objects at low level. However, she  still cannot reach above head to handle obbjects. G-code updated.   Plan P: Remove AA/ROM. Continue with supine A/ROM. Attempt increase of bike  minutes.      Patient will benefit from skilled therapeutic intervention in order to improve the following deficits and impairments:  Decreased activity tolerance, Decreased strength, Decreased range of motion, Pain, Impaired UE functional use, Increased fascial restricitons, Impaired flexibility  Visit Diagnosis: Other symptoms and signs involving the musculoskeletal system  Acute pain of right shoulder    Problem List Patient Active Problem List   Diagnosis Date Noted  . Guaiac positive stools 12/30/2013  . GERD 03/25/2010  . GUAIAC POSITIVE STOOL 03/25/2010  . NECK PAIN, CHRONIC 03/25/2010  . HEADACHE 12/30/2009  . IMPAIRED FASTING GLUCOSE 12/30/2009  . ROM 11/26/2009  . TINEA CRURIS 06/29/2009  . CANDIDIASIS 06/29/2009  . ECZEMA 03/17/2009  . CARPAL TUNNEL SYNDROME 12/15/2008  . INSOMNIA 09/13/2008  . BACK PAIN, LUMBAR, WITH RADICULOPATHY 09/09/2008  . HYPERLIPIDEMIA 08/09/2008  . FOOT PAIN, BILATERAL 08/04/2008  . KNEE, ARTHRITIS, DEGEN./OSTEO 07/29/2008  . FATIGUE 05/18/2008  . ALLERGIC RHINITIS 10/17/2007  . SHOULDER PAIN, BILATERAL 10/17/2007  . CERVICAL RADICULOPATHY 10/07/2007  . ANKLE PAIN, LEFT 08/19/2007  . OBESITY, UNSPECIFIED 06/25/2007  . DEPRESSION 06/25/2007  . ASTHMA, UNSPECIFIED, UNSPECIFIED STATUS 06/25/2007  . GYNECOMASTIA 06/25/2007  . URINARY INCONTINENCE 06/25/2007    Luther Hearing, OT Student [phone]  08/09/2016, 9:43 AM  [place] [address] [place], [name], [address] Phone: [phone]   Fax:  [phone]  Name: Jessica Stevens MRN: [id number] Date of Birth: December 29, 1965

## 2016-08-09 NOTE — Therapy (Addendum)
[place], [name], [address] Phone: [phone]   Fax:  [phone]  Occupational Therapy Treatment  Patient Details  Name: Jessica Stevens MRN: [id number] Date of Birth: 1966/02/18 Referring Provider: Dr. Remo Lipps Case  Encounter Date: 08/09/2016      OT End of Session - 08/09/16 0908    Visit Number 9   Number of Visits 16   Date for OT Re-Evaluation 08/27/16   Authorization Type [employer] Medicare   Authorization Time Period $25 copay; before 19th visit   Authorization - Visit Number 9   Authorization - Number of Visits 19   OT Start Time [phone]   OT Stop Time 0901   OT Time Calculation (min) 42 min   Activity Tolerance Patient tolerated treatment well   Behavior During Therapy [facility] for tasks assessed/performed      Past Medical History:  Diagnosis Date  . Ankle pain, left   . Arthritis of knee, degenerative   . Asthma   . Carpal tunnel syndrome   . Cervical radiculopathy   . Chronic back pain   . Complete rupture of rotator cuff   . Complete rupture of rotator cuff   . Depression   . Gynecomastia   . HNP (herniated nucleus pulposus), cervical   . HNP (herniated nucleus pulposus), cervical   . Hyperlipidemia   . Knee pain   . OA (osteoarthritis) of knee   . Obesity   . Urinary incontinence     Past Surgical History:  Procedure Laterality Date  . arthroscopy  left knee  08/14/2008   Dr. Aline Brochure   . COLONOSCOPY N/A 03/11/2014   Procedure: COLONOSCOPY;  Surgeon: Rogene Houston, MD;  [facility];  Service: Endoscopy;  Laterality: N/A;  730 - moved to 1/13 @ 12:00  . PARTIAL HYSTERECTOMY    . tube insert in right ear  2003    There were no vitals filed for this visit.      Subjective Assessment - 08/09/16 0829    Subjective  S: I'm feeling really good today, not having any pain at all.   Special Tests FOTO: 59.44   Currently in Pain? No/denies            [facility] OT  Assessment - 08/09/16 0830      Assessment   Diagnosis Right RCR     Precautions   Precautions Shoulder   Type of Shoulder Precautions Phase II weeks 6-12 (5/26-7/6): AA/ROM progressing to A/ROM. Phase III after week 12: begin strengthening                  OT Treatments/Exercises (OP) - 08/09/16 0830      Exercises   Exercises Shoulder     Shoulder Exercises: Supine   Protraction PROM;AAROM;AROM;5 reps   Horizontal ABduction PROM;AAROM;AROM;5 reps   External Rotation PROM;AAROM;AROM;5 reps   Internal Rotation PROM;AAROM;AROM;5 reps   Flexion PROM;AAROM;AROM;5 reps   ABduction PROM;AAROM;AROM;5 reps     Shoulder Exercises: ROM/Strengthening   Wall Wash 1'   Thumb Tacks 1'   Ball on Wall 1' green weighted ball   Other ROM/Strengthening Exercises PVC slide 15X     Manual Therapy   Manual Therapy Myofascial release   Manual therapy comments manual therapy completed seperately from all other interventions this date of service   Myofascial Release myofascial release and manual stretching  to right upper arm, scapular, trapezius and shoulder and right side cervical region to decrease  pain and improve pain freem mobility in right shoulder region                 OT Education - 08/09/16 0908    Education provided No          OT Short Term Goals - 08/02/16 1101      OT SHORT TERM GOAL #1   Title Pt will be provided with HEP to improve RUE use as dominant during daily tasks.    Time 4   Period Weeks     OT SHORT TERM GOAL #2   Title Pt will decrease RUE pain to 4/10 to improve ability to use RUE as assist during dressing tasks.    Time 4   Period Weeks   Status On-going     OT SHORT TERM GOAL #3   Title Pt will decrease RUE fascial restrictions from mod to min amounts to improve mobility required for functional reaching.    Time 4   Period Weeks     OT SHORT TERM GOAL #4   Title Pt will improve RUE P/ROM to [facility] to improve ability to use RUE as  assist during ADL completion.    Time 4   Period Weeks     OT SHORT TERM GOAL #5   Title Pt will improve RUE strength to 3/5 to improve ability to reach items at waist level.    Time 4   Period Weeks   Status Partially Met           OT Long Term Goals - 07/05/16 1205      OT LONG TERM GOAL #1   Title Pt will return to prior level of functioning and independence using RUE as dominant during daily tasks.    Time 8   Period Weeks   Status On-going     OT LONG TERM GOAL #2   Title Pt will decrease pain in RUE to 3/10 or less to improve use of RUE during grooming tasks.    Time 8   Period Weeks   Status On-going     OT LONG TERM GOAL #3   Title Pt will decrease RUE fascial restrictions from min to trace amounts to improve mobility required for reaching overhead shelves.    Time 8   Period Weeks   Status On-going     OT LONG TERM GOAL #4   Title Pt will improve A/ROM to [facility] to improve ability to hang decorations in her house.    Time 8   Period Weeks   Status On-going     OT LONG TERM GOAL #5   Title Pt will improve RUE strength to 4/5 to improve ability to use RUE as dominant during cooking and baking tasks.    Time 8   Period Weeks   Status On-going               Plan - 08/09/16 3212    Clinical Impression Statement A: Pt reports there is a catch with P/ROM movements causing discomfort in shoulder. Pt has doctors appointment on 08/17/16 and is going to ask the doctor about the catch. In horizontal abduction of the shoulder supine there is a hard end feel similiar to joint limitation during passive stretching. Completed pvc pipe slides, wall wash, thumb tacks and ball on the wal. VC needed for form and technique. Pt reports bathing using RUE is improving and she is now able to pick up objects at low level. However, she  still cannot reach above head to handle obbjects. G-code updated.   Plan P: Continue supine A/ROM. Complete seated AA/ROM progress to seated A/ROM.  Attempt increase of bike minutes.      Patient will benefit from skilled therapeutic intervention in order to improve the following deficits and impairments:  Decreased activity tolerance, Decreased strength, Decreased range of motion, Pain, Impaired UE functional use, Increased fascial restricitons, Impaired flexibility  Visit Diagnosis: Other symptoms and signs involving the musculoskeletal system  Acute pain of right shoulder     2016/09/06 0902  OT G-codes  Functional Assessment Tool Used (Outpatient only) FOTO 59/100 (41% impaired)  Functional Limitation Carrying, moving and handling objects  Carrying, Moving and Handling Objects Current Status (W0981) CK  Carrying, Moving and Handling Objects Goal Status (X9147) CK    Problem List Patient Active Problem List   Diagnosis Date Noted  . Guaiac positive stools 12/30/2013  . GERD 03/25/2010  . GUAIAC POSITIVE STOOL 03/25/2010  . NECK PAIN, CHRONIC 03/25/2010  . HEADACHE 12/30/2009  . IMPAIRED FASTING GLUCOSE 12/30/2009  . ROM 11/26/2009  . TINEA CRURIS 06/29/2009  . CANDIDIASIS 06/29/2009  . ECZEMA 03/17/2009  . CARPAL TUNNEL SYNDROME 12/15/2008  . INSOMNIA 09/13/2008  . BACK PAIN, LUMBAR, WITH RADICULOPATHY 09/09/2008  . HYPERLIPIDEMIA 09/06/08  . FOOT PAIN, BILATERAL 08/04/2008  . KNEE, ARTHRITIS, DEGEN./OSTEO 07/29/2008  . FATIGUE 05/18/2008  . ALLERGIC RHINITIS 10/17/2007  . SHOULDER PAIN, BILATERAL 10/17/2007  . CERVICAL RADICULOPATHY 10/07/2007  . ANKLE PAIN, LEFT 08/19/2007  . OBESITY, UNSPECIFIED 06/25/2007  . DEPRESSION 06/25/2007  . ASTHMA, UNSPECIFIED, UNSPECIFIED STATUS 06/25/2007  . GYNECOMASTIA 06/25/2007  . URINARY INCONTINENCE 06/25/2007    Luther Hearing, OT Student [phone] 2016/09/06, 11:29 AM  [place], [name], [address] Phone: [phone]   Fax:  [phone]  Name: Jessica Stevens MRN: [id number] Date of  Birth: February 02, 1966   Note reviewed by clinical instructor and accurately reflects treatment session.   Ailene Ravel, OTR/L,CBIS  [phone]

## 2016-08-11 ENCOUNTER — Ambulatory Visit (HOSPITAL_COMMUNITY): Payer: Medicare Other

## 2016-08-11 ENCOUNTER — Encounter (HOSPITAL_COMMUNITY): Payer: Self-pay

## 2016-08-11 DIAGNOSIS — R29898 Other symptoms and signs involving the musculoskeletal system: Secondary | ICD-10-CM

## 2016-08-11 DIAGNOSIS — M25511 Pain in right shoulder: Secondary | ICD-10-CM | POA: Diagnosis not present

## 2016-08-11 NOTE — Therapy (Signed)
[place], [name], [address] Phone: [phone]   Fax:  [phone]  Occupational Therapy Treatment  Patient Details  Name: Jessica Stevens MRN: [id number] Date of Birth: Nov 18, 1965 Referring Provider: Dr. Remo Lipps Case  Encounter Date: 08/11/2016      OT End of Session - 08/11/16 0859    Visit Number 10   Number of Visits 16   Date for OT Re-Evaluation 08/27/16   Authorization Type [employer] Medicare   Authorization Time Period $25 copay; before 19th visit   Authorization - Visit Number 10   Authorization - Number of Visits 19   OT Start Time 0820   OT Stop Time 0900   OT Time Calculation (min) 40 min   Activity Tolerance Patient tolerated treatment well   Behavior During Therapy [facility] for tasks assessed/performed      Past Medical History:  Diagnosis Date  . Ankle pain, left   . Arthritis of knee, degenerative   . Asthma   . Carpal tunnel syndrome   . Cervical radiculopathy   . Chronic back pain   . Complete rupture of rotator cuff   . Complete rupture of rotator cuff   . Depression   . Gynecomastia   . HNP (herniated nucleus pulposus), cervical   . HNP (herniated nucleus pulposus), cervical   . Hyperlipidemia   . Knee pain   . OA (osteoarthritis) of knee   . Obesity   . Urinary incontinence     Past Surgical History:  Procedure Laterality Date  . arthroscopy  left knee  08/14/2008   Dr. Aline Brochure   . COLONOSCOPY N/A 03/11/2014   Procedure: COLONOSCOPY;  Surgeon: Rogene Houston, MD;  [facility];  Service: Endoscopy;  Laterality: N/A;  730 - moved to 1/13 @ 12:00  . PARTIAL HYSTERECTOMY    . tube insert in right ear  2003    There were no vitals filed for this visit.      Subjective Assessment - 08/11/16 0828    Subjective  S: My arm is definitely feeling better. The pain is decreasing.   Currently in Pain? Yes   Pain Score 4    Pain Location Shoulder   Pain Orientation  Right   Pain Descriptors / Indicators Aching;Sore   Pain Type Acute pain   Pain Radiating Towards N/A   Pain Onset More than a month ago   Pain Frequency Intermittent   Aggravating Factors  sleeping position   Pain Relieving Factors ice   Effect of Pain on Daily Activities minimum   Multiple Pain Sites No            [facility] OT Assessment - 08/11/16 0830      Assessment   Diagnosis Right RCR     Precautions   Precautions Shoulder   Type of Shoulder Precautions Phase II weeks 6-12 (5/26-7/6): AA/ROM progressing to A/ROM. Phase III after week 12: begin strengthening                  OT Treatments/Exercises (OP) - 08/11/16 0830      Exercises   Exercises Shoulder     Shoulder Exercises: Supine   Protraction AROM;10 reps   External Rotation AROM;10 reps   Internal Rotation AROM;10 reps   Flexion AROM;10 reps   ABduction AROM;10 reps     Shoulder Exercises: Standing   Protraction AAROM;15 reps   Horizontal ABduction AAROM;15 reps   External Rotation AAROM;15 reps  Internal Rotation AAROM;15 reps   Flexion AAROM;15 reps   ABduction AAROM;15 reps     Shoulder Exercises: ROM/Strengthening   UBE (Upper Arm Bike) Level 1 2' forward, 2' backward   Wall Wash 1'   Thumb Tacks 1'   Proximal Shoulder Strengthening, Supine 10X each   Proximal Shoulder Strengthening, Seated 10X each standing   Ball on Wall 30 seconds twice with rest break needed with small blue weighted ball     Manual Therapy   Manual Therapy Myofascial release   Manual therapy comments manual therapy completed seperately from all other interventions this date of service   Myofascial Release myofascial release and manual stretching  to right upper arm, scapular, trapezius and shoulder and right side cervical region to decrease pain and improve pain freem mobility in right shoulder region                 OT Education - 08/11/16 0859    Education provided No          OT Short Term  Goals - 08/02/16 1101      OT SHORT TERM GOAL #1   Title Pt will be provided with HEP to improve RUE use as dominant during daily tasks.    Time 4   Period Weeks     OT SHORT TERM GOAL #2   Title Pt will decrease RUE pain to 4/10 to improve ability to use RUE as assist during dressing tasks.    Time 4   Period Weeks   Status On-going     OT SHORT TERM GOAL #3   Title Pt will decrease RUE fascial restrictions from mod to min amounts to improve mobility required for functional reaching.    Time 4   Period Weeks     OT SHORT TERM GOAL #4   Title Pt will improve RUE P/ROM to [facility] to improve ability to use RUE as assist during ADL completion.    Time 4   Period Weeks     OT SHORT TERM GOAL #5   Title Pt will improve RUE strength to 3/5 to improve ability to reach items at waist level.    Time 4   Period Weeks   Status Partially Met           OT Long Term Goals - 07/05/16 1205      OT LONG TERM GOAL #1   Title Pt will return to prior level of functioning and independence using RUE as dominant during daily tasks.    Time 8   Period Weeks   Status On-going     OT LONG TERM GOAL #2   Title Pt will decrease pain in RUE to 3/10 or less to improve use of RUE during grooming tasks.    Time 8   Period Weeks   Status On-going     OT LONG TERM GOAL #3   Title Pt will decrease RUE fascial restrictions from min to trace amounts to improve mobility required for reaching overhead shelves.    Time 8   Period Weeks   Status On-going     OT LONG TERM GOAL #4   Title Pt will improve A/ROM to [facility] to improve ability to hang decorations in her house.    Time 8   Period Weeks   Status On-going     OT LONG TERM GOAL #5   Title Pt will improve RUE strength to 4/5 to improve ability to use RUE as dominant during  cooking and baking tasks.    Time 8   Period Weeks   Status On-going               Plan - 08/11/16 0901    Clinical Impression Statement A: Pt reports decreased  pain and is able to progress to all A/ROM supine as well supine and seated proximal shoulder strengthening. Did not complete horizontal abduction A/ROM due to pain. During ball on the wall pt requested break after 30 seconds due to fatigue of shoulder and was able to return finish task.  Pt needed VC for form and technique during wall washa nd thumb tacks. Finsihed session on bike and increased by 2 minutes.   Plan P: Progress to seated A/ROM.       Patient will benefit from skilled therapeutic intervention in order to improve the following deficits and impairments:  Decreased activity tolerance, Decreased strength, Decreased range of motion, Pain, Impaired UE functional use, Increased fascial restricitons, Impaired flexibility  Visit Diagnosis: Other symptoms and signs involving the musculoskeletal system  Acute pain of right shoulder    Problem List Patient Active Problem List   Diagnosis Date Noted  . Guaiac positive stools 12/30/2013  . GERD 03/25/2010  . GUAIAC POSITIVE STOOL 03/25/2010  . NECK PAIN, CHRONIC 03/25/2010  . HEADACHE 12/30/2009  . IMPAIRED FASTING GLUCOSE 12/30/2009  . ROM 11/26/2009  . TINEA CRURIS 06/29/2009  . CANDIDIASIS 06/29/2009  . ECZEMA 03/17/2009  . CARPAL TUNNEL SYNDROME 12/15/2008  . INSOMNIA 09/13/2008  . BACK PAIN, LUMBAR, WITH RADICULOPATHY 09/09/2008  . HYPERLIPIDEMIA 08/09/2008  . FOOT PAIN, BILATERAL 08/04/2008  . KNEE, ARTHRITIS, DEGEN./OSTEO 07/29/2008  . FATIGUE 05/18/2008  . ALLERGIC RHINITIS 10/17/2007  . SHOULDER PAIN, BILATERAL 10/17/2007  . CERVICAL RADICULOPATHY 10/07/2007  . ANKLE PAIN, LEFT 08/19/2007  . OBESITY, UNSPECIFIED 06/25/2007  . DEPRESSION 06/25/2007  . ASTHMA, UNSPECIFIED, UNSPECIFIED STATUS 06/25/2007  . GYNECOMASTIA 06/25/2007  . URINARY INCONTINENCE 06/25/2007    Luther Hearing, OT Student [phone] 08/11/2016, 10:49 AM  [place], [name], [address] Phone: [phone]   Fax:  [phone]  Name: Jessica Stevens MRN: [id number] Date of Birth: 03-21-1965     This qualified practitioner was present in the room guiding the student in service delivery. Therapy student was participating in the provision of services, and the practitioner was not engaged in treating another patient or doing other tasks at the same time.  Ailene Ravel, OTR/L,CBIS  [phone]

## 2016-08-16 ENCOUNTER — Ambulatory Visit (HOSPITAL_COMMUNITY): Payer: Medicare Other

## 2016-08-16 ENCOUNTER — Encounter (HOSPITAL_COMMUNITY): Payer: Self-pay

## 2016-08-16 DIAGNOSIS — M25511 Pain in right shoulder: Secondary | ICD-10-CM

## 2016-08-16 DIAGNOSIS — R29898 Other symptoms and signs involving the musculoskeletal system: Secondary | ICD-10-CM | POA: Diagnosis not present

## 2016-08-16 NOTE — Therapy (Signed)
[place], [name], [address] Phone: [phone]   Fax:  [phone]  Occupational Therapy Treatment  Patient Details  Name: Jessica Stevens MRN: [id number] Date of Birth: [birth year] Referring Provider: Dr. Remo Lipps Case  Encounter Date: 08/16/2016      OT End of Session - 08/16/16 0857    Visit Number 11   Number of Visits 16   Date for OT Re-Evaluation 08/27/16   Authorization Type [employer] Medicare   Authorization Time Period $25 copay; before 19th visit   Authorization - Visit Number 11   Authorization - Number of Visits 19   OT Start Time 0818   OT Stop Time 0900   OT Time Calculation (min) 42 min   Activity Tolerance Patient tolerated treatment well   Behavior During Therapy [facility] for tasks assessed/performed      Past Medical History:  Diagnosis Date  . Ankle pain, left   . Arthritis of knee, degenerative   . Asthma   . Carpal tunnel syndrome   . Cervical radiculopathy   . Chronic back pain   . Complete rupture of rotator cuff   . Complete rupture of rotator cuff   . Depression   . Gynecomastia   . HNP (herniated nucleus pulposus), cervical   . HNP (herniated nucleus pulposus), cervical   . Hyperlipidemia   . Knee pain   . OA (osteoarthritis) of knee   . Obesity   . Urinary incontinence     Past Surgical History:  Procedure Laterality Date  . arthroscopy  left knee  08/14/2008   Dr. Aline Brochure   . COLONOSCOPY N/A 03/11/2014   Procedure: COLONOSCOPY;  Surgeon: Rogene Houston, MD;  [facility];  Service: Endoscopy;  Laterality: N/A;  730 - moved to 1/13 @ 12:00  . PARTIAL HYSTERECTOMY    . tube insert in right ear  2003    There were no vitals filed for this visit.      Subjective Assessment - 08/16/16 0823    Subjective  S: On saturday I just started having so much pain in my arm around the muscles.   Currently in Pain? Yes   Pain Score 9    Pain Location Shoulder   Pain  Orientation Right   Pain Descriptors / Indicators Aching;Sore;Constant   Pain Type Acute pain   Pain Radiating Towards bicep muscle area   Pain Onset More than a month ago   Pain Frequency Constant   Aggravating Factors  sleeping position   Pain Relieving Factors ice   Effect of Pain on Daily Activities minimum            [facility] OT Assessment - 08/16/16 0826      Assessment   Diagnosis Right RCR     Precautions   Precautions Shoulder   Type of Shoulder Precautions Phase II weeks 6-12 (5/26-7/6): AA/ROM progressing to A/ROM. Phase III after week 12: begin strengthening     ROM / Strength   AROM / PROM / Strength AROM;PROM;Strength     AROM   Overall AROM Comments Assessed supine then seated for A/ROM   AROM Assessment Site Shoulder   Right/Left Shoulder Right   Right Shoulder Flexion 139 Degrees: supine Seated: 105 previous: 90 (seated)   Right Shoulder ABduction 92 Degrees: supine seated: 103 previous: 110 (seated)   Right Shoulder Internal Rotation 90 Degrees: supine previous: 90 both supine and seated   Right Shoulder External Rotation 35  Degrees: supine seated: 35 previous: 13 (seated)     Strength   Overall Strength Comments Assessed seated. IR/er adducted.    Strength Assessment Site Shoulder   Right/Left Shoulder Right   Right Shoulder Flexion 3-/5  previous: 3-/5   Right Shoulder ABduction 3-/5  previous: 3-/5   Right Shoulder Internal Rotation 5/5  previous: 3/5   Right Shoulder External Rotation 3-/5  previous: 3-/5                  OT Treatments/Exercises (OP) - 08/16/16 0826      Exercises   Exercises Shoulder     Shoulder Exercises: Supine   Protraction PROM;5 reps   Horizontal ABduction PROM;5 reps   External Rotation PROM;5 reps   Internal Rotation PROM;5 reps   Flexion PROM;5 reps   ABduction PROM;5 reps     Shoulder Exercises: Seated   Protraction AROM;10 reps   Horizontal ABduction AROM;10 reps   External Rotation  AROM;10 reps   Internal Rotation AROM;10 reps   Flexion AROM;10 reps   Abduction AROM;10 reps     Shoulder Exercises: ROM/Strengthening   UBE (Upper Arm Bike) Level 2 2' forward, 2' backward   Proximal Shoulder Strengthening, Seated 10X each    Other ROM/Strengthening Exercises PVC slide 15X     Manual Therapy   Manual Therapy Myofascial release   Manual therapy comments manual therapy completed seperately from all other interventions this date of service   Myofascial Release myofascial release and manual stretching  to right upper arm, scapular, trapezius and shoulder and right side cervical region to decrease pain and improve pain freem mobility in right shoulder region                 OT Education - 08/16/16 0856    Education provided Yes   Education Details Provided pt with measurements of shoulder ROM, explained to pt that she was not ready for strengthening yet due to her protocol   Person(s) Educated Patient   Methods Explanation;Handout   Comprehension Verbalized understanding          OT Short Term Goals - 08/02/16 1101      OT SHORT TERM GOAL #1   Title Pt will be provided with HEP to improve RUE use as dominant during daily tasks.    Time 4   Period Weeks     OT SHORT TERM GOAL #2   Title Pt will decrease RUE pain to 4/10 to improve ability to use RUE as assist during dressing tasks.    Time 4   Period Weeks   Status On-going     OT SHORT TERM GOAL #3   Title Pt will decrease RUE fascial restrictions from mod to min amounts to improve mobility required for functional reaching.    Time 4   Period Weeks     OT SHORT TERM GOAL #4   Title Pt will improve RUE P/ROM to [facility] to improve ability to use RUE as assist during ADL completion.    Time 4   Period Weeks     OT SHORT TERM GOAL #5   Title Pt will improve RUE strength to 3/5 to improve ability to reach items at waist level.    Time 4   Period Weeks   Status Partially Met           OT Long  Term Goals - 07/05/16 1205      OT LONG TERM GOAL #1   Title Pt  will return to prior level of functioning and independence using RUE as dominant during daily tasks.    Time 8   Period Weeks   Status On-going     OT LONG TERM GOAL #2   Title Pt will decrease pain in RUE to 3/10 or less to improve use of RUE during grooming tasks.    Time 8   Period Weeks   Status On-going     OT LONG TERM GOAL #3   Title Pt will decrease RUE fascial restrictions from min to trace amounts to improve mobility required for reaching overhead shelves.    Time 8   Period Weeks   Status On-going     OT LONG TERM GOAL #4   Title Pt will improve A/ROM to [facility] to improve ability to hang decorations in her house.    Time 8   Period Weeks   Status On-going     OT LONG TERM GOAL #5   Title Pt will improve RUE strength to 4/5 to improve ability to use RUE as dominant during cooking and baking tasks.    Time 8   Period Weeks   Status On-going               Plan - 08/16/16 0867    Clinical Impression Statement A: Pt reports she had a pain increase over the weeekend. Measurements were taken for doctors appointment. Progressed to seated A/ROM shoulder exercises with VC needed for form and technique. Pt was unable to achieve full range of motion and could only get approximately 50% range. Pt reported PVC pipe slides fatigued her today doing them after the A/ROM. At end of session, pt reported pain had decreased to 6/10. Pt has doctors appointment on 08/17/16.   Plan P: Work towards completing A/ROM with less pain. Follow up on doctor's appointment from 08/17/16.      Patient will benefit from skilled therapeutic intervention in order to improve the following deficits and impairments:  Decreased activity tolerance, Decreased strength, Decreased range of motion, Pain, Impaired UE functional use, Increased fascial restricitons, Impaired flexibility  Visit Diagnosis: Other symptoms and signs involving the  musculoskeletal system  Acute pain of right shoulder    Problem List Patient Active Problem List   Diagnosis Date Noted  . Guaiac positive stools 12/30/2013  . GERD 03/25/2010  . GUAIAC POSITIVE STOOL 03/25/2010  . NECK PAIN, CHRONIC 03/25/2010  . HEADACHE 12/30/2009  . IMPAIRED FASTING GLUCOSE 12/30/2009  . ROM 11/26/2009  . TINEA CRURIS 06/29/2009  . CANDIDIASIS 06/29/2009  . ECZEMA 03/17/2009  . CARPAL TUNNEL SYNDROME 12/15/2008  . INSOMNIA 09/13/2008  . BACK PAIN, LUMBAR, WITH RADICULOPATHY 09/09/2008  . HYPERLIPIDEMIA 08/09/2008  . FOOT PAIN, BILATERAL 08/04/2008  . KNEE, ARTHRITIS, DEGEN./OSTEO 07/29/2008  . FATIGUE 05/18/2008  . ALLERGIC RHINITIS 10/17/2007  . SHOULDER PAIN, BILATERAL 10/17/2007  . CERVICAL RADICULOPATHY 10/07/2007  . ANKLE PAIN, LEFT 08/19/2007  . OBESITY, UNSPECIFIED 06/25/2007  . DEPRESSION 06/25/2007  . ASTHMA, UNSPECIFIED, UNSPECIFIED STATUS 06/25/2007  . GYNECOMASTIA 06/25/2007  . URINARY INCONTINENCE 06/25/2007    Luther Hearing, OT Student [phone] 08/16/2016, 9:25 AM  [place], [name], [address] Phone: [phone]   Fax:  [phone]  Name: Jessica Stevens MRN: [id number] Date of Birth: [birth year]     This qualified practitioner was present in the room guiding the student in service delivery. Therapy student was participating in the provision of services, and the practitioner  was not engaged in treating another patient or doing other tasks at the same time.  Ailene Ravel, OTR/L,CBIS  [phone]

## 2016-08-18 ENCOUNTER — Ambulatory Visit (HOSPITAL_COMMUNITY): Payer: Medicare Other

## 2016-08-18 ENCOUNTER — Telehealth (HOSPITAL_COMMUNITY): Payer: Self-pay | Admitting: Internal Medicine

## 2016-08-18 ENCOUNTER — Telehealth (HOSPITAL_COMMUNITY): Payer: Self-pay | Admitting: Student

## 2016-08-18 NOTE — Telephone Encounter (Signed)
Error

## 2016-08-18 NOTE — Telephone Encounter (Signed)
Called pt regarding no show. No answer. Left message on her answering machine letting her know she missed her appointment this morning and let her know when the next appointment is. Provided her with [facility] phone number to call back if needed.

## 2016-08-23 ENCOUNTER — Ambulatory Visit (HOSPITAL_COMMUNITY): Payer: Medicare Other

## 2016-08-23 ENCOUNTER — Encounter (HOSPITAL_COMMUNITY): Payer: Self-pay

## 2016-08-23 DIAGNOSIS — R29898 Other symptoms and signs involving the musculoskeletal system: Secondary | ICD-10-CM

## 2016-08-23 DIAGNOSIS — M25511 Pain in right shoulder: Secondary | ICD-10-CM

## 2016-08-23 NOTE — Addendum Note (Signed)
Addended by: Ailene Ravel D on: 08/23/2016 03:31 PM   Modules accepted: Orders

## 2016-08-23 NOTE — Therapy (Signed)
[place], [name], [address] Phone: [phone]   Fax:  [phone]  Occupational Therapy Treatment  Patient Details  Name: Jessica Stevens MRN: [id number] Date of Birth: [birth year] Referring Provider: Dr. Remo Lipps Case  Encounter Date: 08/23/2016      OT End of Session - 08/23/16 1008    Visit Number 12   Number of Visits 16   Date for OT Re-Evaluation 09/22/16   Authorization Type [employer] Medicare   Authorization Time Period $25 copay; before 19th visit   Authorization - Visit Number 12   Authorization - Number of Visits 19   OT Start Time [id number]  mini-reassessment   OT Stop Time 0900   OT Time Calculation (min) 38 min   Activity Tolerance Patient tolerated treatment well   Behavior During Therapy [facility] for tasks assessed/performed      Past Medical History:  Diagnosis Date  . Ankle pain, left   . Arthritis of knee, degenerative   . Asthma   . Carpal tunnel syndrome   . Cervical radiculopathy   . Chronic back pain   . Complete rupture of rotator cuff   . Complete rupture of rotator cuff   . Depression   . Gynecomastia   . HNP (herniated nucleus pulposus), cervical   . HNP (herniated nucleus pulposus), cervical   . Hyperlipidemia   . Knee pain   . OA (osteoarthritis) of knee   . Obesity   . Urinary incontinence     Past Surgical History:  Procedure Laterality Date  . arthroscopy  left knee  08/14/2008   Dr. Aline Brochure   . COLONOSCOPY N/A 03/11/2014   Procedure: COLONOSCOPY;  Surgeon: Rogene Houston, MD;  [facility];  Service: Endoscopy;  Laterality: N/A;  730 - moved to 1/13 @ 12:00  . PARTIAL HYSTERECTOMY    . tube insert in right ear  2003    There were no vitals filed for this visit.      Subjective Assessment - 08/23/16 0825    Subjective  S: I rested over the weekend and my arm is feeling pretty good.   Currently in Pain? No/denies            [facility] OT Assessment -  08/23/16 0826      Assessment   Diagnosis Right RCR     Precautions   Precautions Shoulder   Type of Shoulder Precautions Phase II weeks 6-12 (5/26-7/6): AA/ROM progressing to A/ROM. Phase III after week 12: begin strengthening     AROM   Overall AROM Comments Assessed seated. IR/er adducted.   AROM Assessment Site Shoulder   Right/Left Shoulder Right   Right Shoulder Flexion 127 Degrees  Previous seated: 105   Right Shoulder ABduction 119 Degrees  previous seated: 103   Right Shoulder Internal Rotation 90 Degrees  previous 90   Right Shoulder External Rotation 22 Degrees  previous seated: 35     Strength   Overall Strength Comments Assessed seated. IR/er adducted.    Strength Assessment Site Shoulder   Right/Left Shoulder Right   Right Shoulder Flexion 3-/5   Right Shoulder ABduction 3-/5   Right Shoulder Internal Rotation 5/5   Right Shoulder External Rotation 3-/5                  OT Treatments/Exercises (OP) - 08/23/16 0826      Exercises   Exercises Shoulder     Shoulder Exercises: Supine  Protraction PROM;5 reps;AROM;10 reps   Horizontal ABduction PROM;5 reps;AROM;10 reps   External Rotation PROM;5 reps;AROM;10 reps   Internal Rotation PROM;5 reps;AROM;10 reps   Flexion PROM;5 reps;AROM;10 reps   ABduction PROM;5 reps;AROM;10 reps     Shoulder Exercises: Seated   Protraction AROM;10 reps   Horizontal ABduction AROM;10 reps   External Rotation AROM;10 reps   Internal Rotation AROM;10 reps   Flexion AROM;10 reps   Abduction AROM;10 reps     Shoulder Exercises: ROM/Strengthening   UBE (Upper Arm Bike) Level 2 2' forward, 2' backward   Over Head Lace 2'   Proximal Shoulder Strengthening, Seated 10X each   standing     Manual Therapy   Manual Therapy Myofascial release   Manual therapy comments manual therapy completed seperately from all other interventions this date of service   Myofascial Release myofascial release and manual stretching   to right upper arm, scapular, trapezius and shoulder and right side cervical region to decrease pain and improve pain freem mobility in right shoulder region                 OT Education - 08/23/16 1007    Education provided Yes   Education Details educated pt on improvements in A/ROM shoulder movements   Person(s) Educated Patient   Methods Explanation   Comprehension Verbalized understanding          OT Short Term Goals - 08/23/16 1015      OT SHORT TERM GOAL #1   Title Pt will be provided with HEP to improve RUE use as dominant during daily tasks.    Time 4   Period Weeks     OT SHORT TERM GOAL #2   Title Pt will decrease RUE pain to 4/10 to improve ability to use RUE as assist during dressing tasks.    Time 4   Period Weeks   Status Achieved     OT SHORT TERM GOAL #3   Title Pt will decrease RUE fascial restrictions from mod to min amounts to improve mobility required for functional reaching.    Time 4   Period Weeks     OT SHORT TERM GOAL #4   Title Pt will improve RUE P/ROM to [facility] to improve ability to use RUE as assist during ADL completion.    Time 4   Period Weeks     OT SHORT TERM GOAL #5   Title Pt will improve RUE strength to 3/5 to improve ability to reach items at waist level.    Time 4   Period Weeks   Status Partially Met           OT Long Term Goals - 08/23/16 1016      OT LONG TERM GOAL #1   Title Pt will return to prior level of functioning and independence using RUE as dominant during daily tasks.    Time 8   Period Weeks   Status On-going     OT LONG TERM GOAL #2   Title Pt will decrease pain in RUE to 3/10 or less to improve use of RUE during grooming tasks.    Time 8   Period Weeks   Status Achieved     OT LONG TERM GOAL #3   Title Pt will decrease RUE fascial restrictions from min to trace amounts to improve mobility required for reaching overhead shelves.    Time 8   Period Weeks   Status Achieved     OT LONG  TERM  GOAL #4   Title Pt will improve A/ROM to [facility] to improve ability to hang decorations in her house.    Time 8   Period Weeks   Status On-going     OT LONG TERM GOAL #5   Title Pt will improve RUE strength to 4/5 to improve ability to use RUE as dominant during cooking and baking tasks.    Time 8   Period Weeks   Status On-going               Plan - 08/23/16 1013    Clinical Impression Statement A: Today's session focused on doing a reassessment. Pt achieved 1 short term goal and partially met 1 as well as achieved 2 long term goals with 3 still on-going. Pt has overall decreased fascial restrictions and decreased pain. Pt still needs to improve on RUE strength and A/ROM for shoulder. Completed A/ROM seated and overhead lacing with VC needed for form and technique.    Plan P: Complete standing A/ROM for shoulder. Complete myofascial release PRN. Add functional reaching task.      Patient will benefit from skilled therapeutic intervention in order to improve the following deficits and impairments:  Decreased activity tolerance, Decreased strength, Decreased range of motion, Pain, Impaired UE functional use, Increased fascial restricitons, Impaired flexibility  Visit Diagnosis: Other symptoms and signs involving the musculoskeletal system  Acute pain of right shoulder    Problem List Patient Active Problem List   Diagnosis Date Noted  . Guaiac positive stools 12/30/2013  . GERD 03/25/2010  . GUAIAC POSITIVE STOOL 03/25/2010  . NECK PAIN, CHRONIC 03/25/2010  . HEADACHE 12/30/2009  . IMPAIRED FASTING GLUCOSE 12/30/2009  . ROM 11/26/2009  . TINEA CRURIS 06/29/2009  . CANDIDIASIS 06/29/2009  . ECZEMA 03/17/2009  . CARPAL TUNNEL SYNDROME 12/15/2008  . INSOMNIA 09/13/2008  . BACK PAIN, LUMBAR, WITH RADICULOPATHY 09/09/2008  . HYPERLIPIDEMIA 08/09/2008  . FOOT PAIN, BILATERAL 08/04/2008  . KNEE, ARTHRITIS, DEGEN./OSTEO 07/29/2008  . FATIGUE 05/18/2008  . ALLERGIC RHINITIS  10/17/2007  . SHOULDER PAIN, BILATERAL 10/17/2007  . CERVICAL RADICULOPATHY 10/07/2007  . ANKLE PAIN, LEFT 08/19/2007  . OBESITY, UNSPECIFIED 06/25/2007  . DEPRESSION 06/25/2007  . ASTHMA, UNSPECIFIED, UNSPECIFIED STATUS 06/25/2007  . GYNECOMASTIA 06/25/2007  . URINARY INCONTINENCE 06/25/2007    Luther Hearing, OT Student [phone] 08/23/2016, 10:23 AM  [place], [name], [address] Phone: [phone]   Fax:  [phone]  Name: Jessica Stevens MRN: [id number] Date of Birth: [birth year]     This qualified practitioner was present in the room guiding the student in service delivery. Therapy student was participating in the provision of services, and the practitioner was not engaged in treating another patient or doing other tasks at the same time.  Ailene Ravel, OTR/L,CBIS  [phone]

## 2016-08-25 ENCOUNTER — Ambulatory Visit (HOSPITAL_COMMUNITY): Payer: Medicare Other

## 2016-08-25 ENCOUNTER — Encounter (HOSPITAL_COMMUNITY): Payer: Self-pay

## 2016-08-25 DIAGNOSIS — R29898 Other symptoms and signs involving the musculoskeletal system: Secondary | ICD-10-CM | POA: Diagnosis not present

## 2016-08-25 DIAGNOSIS — M25511 Pain in right shoulder: Secondary | ICD-10-CM | POA: Diagnosis not present

## 2016-08-25 NOTE — Therapy (Signed)
[place], [name], [address] Phone: [phone]   Fax:  [phone]  Occupational Therapy Treatment  Patient Details  Name: Jessica Stevens MRN: [id number] Date of Birth: 1965/09/26 Referring Provider: Dr. Remo Lipps Case  Encounter Date: 08/25/2016      OT End of Session - 08/25/16 0912    Visit Number 13   Number of Visits 16   Date for OT Re-Evaluation 09/22/16   Authorization Type [employer] Medicare   Authorization Time Period $25 copay; before 19th visit   Authorization - Visit Number 13   Authorization - Number of Visits 19   OT Start Time 0820   OT Stop Time 0900   OT Time Calculation (min) 40 min   Activity Tolerance Patient tolerated treatment well   Behavior During Therapy [facility] for tasks assessed/performed      Past Medical History:  Diagnosis Date  . Ankle pain, left   . Arthritis of knee, degenerative   . Asthma   . Carpal tunnel syndrome   . Cervical radiculopathy   . Chronic back pain   . Complete rupture of rotator cuff   . Complete rupture of rotator cuff   . Depression   . Gynecomastia   . HNP (herniated nucleus pulposus), cervical   . HNP (herniated nucleus pulposus), cervical   . Hyperlipidemia   . Knee pain   . OA (osteoarthritis) of knee   . Obesity   . Urinary incontinence     Past Surgical History:  Procedure Laterality Date  . arthroscopy  left knee  08/14/2008   Dr. Aline Brochure   . COLONOSCOPY N/A 03/11/2014   Procedure: COLONOSCOPY;  Surgeon: Rogene Houston, MD;  [facility];  Service: Endoscopy;  Laterality: N/A;  730 - moved to 1/13 @ 12:00  . PARTIAL HYSTERECTOMY    . tube insert in right ear  2003    There were no vitals filed for this visit.      Subjective Assessment - 08/25/16 0833    Subjective  S: Sometimes there's a shooting feeling going down into my wrist.   Currently in Pain? No/denies            [facility] OT Assessment - 08/25/16 0001       Assessment   Diagnosis Right RCR     Precautions   Precautions Shoulder   Type of Shoulder Precautions Phase II weeks 6-12 (5/26-7/6): AA/ROM progressing to A/ROM. Phase III after week 12: begin strengthening                  OT Treatments/Exercises (OP) - 08/25/16 0834      Exercises   Exercises Shoulder     Shoulder Exercises: Supine   Protraction PROM;5 reps   Horizontal ABduction PROM;5 reps   External Rotation PROM;5 reps   Internal Rotation PROM;5 reps   Flexion PROM;5 reps   ABduction PROM;5 reps     Shoulder Exercises: Standing   Protraction AROM;10 reps   Horizontal ABduction AROM;10 reps   External Rotation AROM;10 reps   Internal Rotation AROM;10 reps   Flexion AROM;10 reps   ABduction AROM;10 reps     Shoulder Exercises: ROM/Strengthening   UBE (Upper Arm Bike) Level 2 2' forward, 2' backward   Over Head Lace 2'   Proximal Shoulder Strengthening, Seated 12X each  standing   Ball on Wall 1' flexion, 30" abduction     Shoulder Exercises: Stretch   Wall Stretch -  Flexion 30 seconds;1 rep   Wall Stretch - ABduction 30 seconds;1 rep     Functional Reaching Activities   High Level Pt completed activity putting 10 cones from counter onto first shelf, then moving them to second shelf, then placing them back on the counter.                OT Education - 08/25/16 6295    Education provided No          OT Short Term Goals - 08/25/16 1001      OT SHORT TERM GOAL #1   Title Pt will be provided with HEP to improve RUE use as dominant during daily tasks.    Time 4   Period Weeks     OT SHORT TERM GOAL #2   Title Pt will decrease RUE pain to 4/10 to improve ability to use RUE as assist during dressing tasks.    Time 4   Period Weeks     OT SHORT TERM GOAL #3   Title Pt will decrease RUE fascial restrictions from mod to min amounts to improve mobility required for functional reaching.    Time 4   Period Weeks     OT SHORT TERM GOAL  #4   Title Pt will improve RUE P/ROM to [facility] to improve ability to use RUE as assist during ADL completion.    Time 4   Period Weeks     OT SHORT TERM GOAL #5   Title Pt will improve RUE strength to 3/5 to improve ability to reach items at waist level.    Time 4   Period Weeks   Status Partially Met           OT Long Term Goals - 08/25/16 1001      OT LONG TERM GOAL #1   Title Pt will return to prior level of functioning and independence using RUE as dominant during daily tasks.    Time 8   Period Weeks   Status On-going     OT LONG TERM GOAL #2   Title Pt will decrease pain in RUE to 3/10 or less to improve use of RUE during grooming tasks.    Time 8   Period Weeks     OT LONG TERM GOAL #3   Title Pt will decrease RUE fascial restrictions from min to trace amounts to improve mobility required for reaching overhead shelves.    Time 8   Period Weeks     OT LONG TERM GOAL #4   Title Pt will improve A/ROM to [facility] to improve ability to hang decorations in her house.    Time 8   Period Weeks   Status On-going     OT LONG TERM GOAL #5   Title Pt will improve RUE strength to 4/5 to improve ability to use RUE as dominant during cooking and baking tasks.    Time 8   Period Weeks   Status On-going               Plan - 08/25/16 0913    Clinical Impression Statement A: Pt progressed to standing A/ROM and needed cueing to stay standing upright with good posture. Added functional reaching task with cones and ball on the wall and pt needed rest breaks during each activity. Overhead lacing completed with VC needed for form and technique. Pt reported increased fatigue at end of session.   Plan P: Continue with standing A/ROM exercises within patient's pain  tolerance. Complete functional reaching task.      Patient will benefit from skilled therapeutic intervention in order to improve the following deficits and impairments:  Decreased activity tolerance, Decreased strength,  Decreased range of motion, Pain, Impaired UE functional use, Increased fascial restricitons, Impaired flexibility  Visit Diagnosis: Acute pain of right shoulder  Other symptoms and signs involving the musculoskeletal system    Problem List Patient Active Problem List   Diagnosis Date Noted  . Guaiac positive stools 12/30/2013  . GERD 03/25/2010  . GUAIAC POSITIVE STOOL 03/25/2010  . NECK PAIN, CHRONIC 03/25/2010  . HEADACHE 12/30/2009  . IMPAIRED FASTING GLUCOSE 12/30/2009  . ROM 11/26/2009  . TINEA CRURIS 06/29/2009  . CANDIDIASIS 06/29/2009  . ECZEMA 03/17/2009  . CARPAL TUNNEL SYNDROME 12/15/2008  . INSOMNIA 09/13/2008  . BACK PAIN, LUMBAR, WITH RADICULOPATHY 09/09/2008  . HYPERLIPIDEMIA 08/09/2008  . FOOT PAIN, BILATERAL 08/04/2008  . KNEE, ARTHRITIS, DEGEN./OSTEO 07/29/2008  . FATIGUE 05/18/2008  . ALLERGIC RHINITIS 10/17/2007  . SHOULDER PAIN, BILATERAL 10/17/2007  . CERVICAL RADICULOPATHY 10/07/2007  . ANKLE PAIN, LEFT 08/19/2007  . OBESITY, UNSPECIFIED 06/25/2007  . DEPRESSION 06/25/2007  . ASTHMA, UNSPECIFIED, UNSPECIFIED STATUS 06/25/2007  . GYNECOMASTIA 06/25/2007  . URINARY INCONTINENCE 06/25/2007    Luther Hearing, OT Student [phone] 08/25/2016, 10:01 AM  [place], [name], [address] Phone: [phone]   Fax:  [phone]  Name: ELAIN WIXON MRN: [id number] Date of Birth: 02/26/1966     This qualified practitioner was present in the room guiding the student in service delivery. Therapy student was participating in the provision of services, and the practitioner was not engaged in treating another patient or doing other tasks at the same time.  Ailene Ravel, OTR/L,CBIS  [phone]

## 2016-09-01 ENCOUNTER — Encounter (HOSPITAL_COMMUNITY): Payer: Self-pay | Admitting: Occupational Therapy

## 2016-09-01 ENCOUNTER — Ambulatory Visit (HOSPITAL_COMMUNITY): Payer: Medicare Other | Attending: Orthopedic Surgery | Admitting: Occupational Therapy

## 2016-09-01 DIAGNOSIS — R29898 Other symptoms and signs involving the musculoskeletal system: Secondary | ICD-10-CM | POA: Diagnosis not present

## 2016-09-01 DIAGNOSIS — M25511 Pain in right shoulder: Secondary | ICD-10-CM | POA: Diagnosis not present

## 2016-09-01 NOTE — Therapy (Addendum)
[place], [name], [address] Phone: [phone]   Fax:  [phone]  Occupational Therapy Treatment  Patient Details  Name: Jessica Stevens MRN: [id number] Date of Birth: August 29, 1965 Referring Provider: Dr. Remo Lipps Case  Encounter Date: 09/01/2016      OT End of Session - 09/01/16 1026    Visit Number 14   Number of Visits 16   Date for OT Re-Evaluation 09/22/16   Authorization Type [employer] Medicare   Authorization Time Period $25 copay; before 19th visit   Authorization - Visit Number 14   Authorization - Number of Visits 19   OT Start Time [phone]   OT Stop Time 1026   OT Time Calculation (min) 40 min   Activity Tolerance Patient tolerated treatment well   Behavior During Therapy [facility] for tasks assessed/performed      Past Medical History:  Diagnosis Date  . Ankle pain, left   . Arthritis of knee, degenerative   . Asthma   . Carpal tunnel syndrome   . Cervical radiculopathy   . Chronic back pain   . Complete rupture of rotator cuff   . Complete rupture of rotator cuff   . Depression   . Gynecomastia   . HNP (herniated nucleus pulposus), cervical   . HNP (herniated nucleus pulposus), cervical   . Hyperlipidemia   . Knee pain   . OA (osteoarthritis) of knee   . Obesity   . Urinary incontinence     Past Surgical History:  Procedure Laterality Date  . arthroscopy  left knee  08/14/2008   Dr. Aline Brochure   . COLONOSCOPY N/A 03/11/2014   Procedure: COLONOSCOPY;  Surgeon: Rogene Houston, MD;  Location: [facility];  Service: Endoscopy;  Laterality: N/A;  730 - moved to 1/13 @ 12:00  . PARTIAL HYSTERECTOMY    . tube insert in right ear  2003    There were no vitals filed for this visit.      Subjective Assessment - 09/01/16 0947    Subjective  S: The doctor said I might need a total shoulder replacement at some point.    Currently in Pain? No/denies            [facility] OT Assessment - 09/01/16  0947      Assessment   Diagnosis Right RCR     Precautions   Precautions Shoulder   Type of Shoulder Precautions Phase II weeks 6-12 (5/26-7/6): AA/ROM progressing to A/ROM. Phase III after week 12: begin strengthening                  OT Treatments/Exercises (OP) - 09/01/16 0947      Exercises   Exercises Shoulder     Shoulder Exercises: Supine   Protraction PROM;5 reps;AROM;12 reps   Horizontal ABduction PROM;5 reps;AROM;12 reps   External Rotation PROM;5 reps;AROM;12 reps   Internal Rotation PROM;5 reps;AROM;12 reps   Flexion PROM;5 reps;AROM;12 reps   ABduction PROM;5 reps     Shoulder Exercises: Standing   Protraction AROM;10 reps   Horizontal ABduction AROM;10 reps   External Rotation AROM;10 reps   Internal Rotation AROM;10 reps   Flexion AROM;10 reps   Flexion Limitations 50% range   ABduction AROM;10 reps   ABduction Limitations <50% range   Extension Theraband;10 reps   Theraband Level (Shoulder Extension) Level 2 (Red)   Row Theraband;10 reps   Theraband Level (Shoulder Row) Level 2 (Red)   Retraction Theraband;10 reps  Theraband Level (Shoulder Retraction) Level 2 (Red)     Shoulder Exercises: ROM/Strengthening   UBE (Upper Arm Bike) Level 2 2' forward, 2' backward   Proximal Shoulder Strengthening, Seated 12X each   Ball on Wall 1' flexion, 30" abduction     Functional Reaching Activities   Mid Level Pt completed pinch tree task, placing 13 clothespins reaching approximately 50% range.      Manual Therapy   Manual Therapy Myofascial release   Manual therapy comments manual therapy completed seperately from all other interventions this date of service   Myofascial Release myofascial release and manual stretching  to right upper arm, scapular, trapezius and shoulder and right side cervical region to decrease pain and improve pain freem mobility in right shoulder region                   OT Short Term Goals - 08/25/16 1001       OT SHORT TERM GOAL #1   Title Pt will be provided with HEP to improve RUE use as dominant during daily tasks.    Time 4   Period Weeks     OT SHORT TERM GOAL #2   Title Pt will decrease RUE pain to 4/10 to improve ability to use RUE as assist during dressing tasks.    Time 4   Period Weeks     OT SHORT TERM GOAL #3   Title Pt will decrease RUE fascial restrictions from mod to min amounts to improve mobility required for functional reaching.    Time 4   Period Weeks     OT SHORT TERM GOAL #4   Title Pt will improve RUE P/ROM to [facility] to improve ability to use RUE as assist during ADL completion.    Time 4   Period Weeks     OT SHORT TERM GOAL #5   Title Pt will improve RUE strength to 3/5 to improve ability to reach items at waist level.    Time 4   Period Weeks   Status Partially Met           OT Long Term Goals - 08/25/16 1001      OT LONG TERM GOAL #1   Title Pt will return to prior level of functioning and independence using RUE as dominant during daily tasks.    Time 8   Period Weeks   Status On-going     OT LONG TERM GOAL #2   Title Pt will decrease pain in RUE to 3/10 or less to improve use of RUE during grooming tasks.    Time 8   Period Weeks     OT LONG TERM GOAL #3   Title Pt will decrease RUE fascial restrictions from min to trace amounts to improve mobility required for reaching overhead shelves.    Time 8   Period Weeks     OT LONG TERM GOAL #4   Title Pt will improve A/ROM to [facility] to improve ability to hang decorations in her house.    Time 8   Period Weeks   Status On-going     OT LONG TERM GOAL #5   Title Pt will improve RUE strength to 4/5 to improve ability to use RUE as dominant during cooking and baking tasks.    Time 8   Period Weeks   Status On-going               Plan - 09/01/16 1027    Clinical Impression  Statement A: Continued with A/ROM supine and standing this session, range limited to 50% with correct form and posture,  painful above 50% range in P/ROM and A/ROM. Completed functional reaching task with pinch tree, able to reach 50% range with this activity as well. Pt reports MD instructed her to add theraband tasks-scapular theraband added today. Verbal cuing intermittently for correct form, rest breaks occasionally during session.    Plan P: Continue with A/ROM and scapular theraband within pt's pain tolerance.       Patient will benefit from skilled therapeutic intervention in order to improve the following deficits and impairments:  Decreased activity tolerance, Decreased strength, Decreased range of motion, Pain, Impaired UE functional use, Increased fascial restricitons, Impaired flexibility  Visit Diagnosis: Acute pain of right shoulder  Other symptoms and signs involving the musculoskeletal system    Problem List Patient Active Problem List   Diagnosis Date Noted  . Guaiac positive stools 12/30/2013  . GERD 03/25/2010  . GUAIAC POSITIVE STOOL 03/25/2010  . NECK PAIN, CHRONIC 03/25/2010  . HEADACHE 12/30/2009  . IMPAIRED FASTING GLUCOSE 12/30/2009  . ROM 11/26/2009  . TINEA CRURIS 06/29/2009  . CANDIDIASIS 06/29/2009  . ECZEMA 03/17/2009  . CARPAL TUNNEL SYNDROME 12/15/2008  . INSOMNIA 09/13/2008  . BACK PAIN, LUMBAR, WITH RADICULOPATHY 09/09/2008  . HYPERLIPIDEMIA 08/09/2008  . FOOT PAIN, BILATERAL 08/04/2008  . KNEE, ARTHRITIS, DEGEN./OSTEO 07/29/2008  . FATIGUE 05/18/2008  . ALLERGIC RHINITIS 10/17/2007  . SHOULDER PAIN, BILATERAL 10/17/2007  . CERVICAL RADICULOPATHY 10/07/2007  . ANKLE PAIN, LEFT 08/19/2007  . OBESITY, UNSPECIFIED 06/25/2007  . DEPRESSION 06/25/2007  . ASTHMA, UNSPECIFIED, UNSPECIFIED STATUS 06/25/2007  . GYNECOMASTIA 06/25/2007  . URINARY INCONTINENCE 06/25/2007   Guadelupe Sabin, OTR/L  [phone] 09/01/2016, 10:31 AM  [place], [name], [address] Phone: [phone]   Fax:   [phone]  Name: Jessica Stevens MRN: [id number] Date of Birth: [birth year]     OCCUPATIONAL THERAPY DISCHARGE SUMMARY  Visits from Start of Care:   Current functional level related to goals / functional outcomes: Unknown. Pt discharging due to not returning since last visit and not returning phone calls regarding scheduling or discharging.    Remaining deficits: At last visit pt continuing to have pain above 50% range of motion.    Education / Equipment: Pt educated on A/ROM exercises Plan: Patient agrees to discharge.  Patient goals were partially met. Patient is being discharged due to not returning since the last visit.  ?????

## 2016-09-14 ENCOUNTER — Telehealth (HOSPITAL_COMMUNITY): Payer: Self-pay

## 2016-09-14 NOTE — Telephone Encounter (Signed)
Called l/m requesting pt to let us know if she wants to R/S or be D/c.

## 2016-09-29 DIAGNOSIS — M25562 Pain in left knee: Secondary | ICD-10-CM | POA: Diagnosis not present

## 2016-09-29 DIAGNOSIS — M1712 Unilateral primary osteoarthritis, left knee: Secondary | ICD-10-CM | POA: Diagnosis not present

## 2016-10-19 ENCOUNTER — Other Ambulatory Visit (HOSPITAL_COMMUNITY): Payer: Self-pay | Admitting: Internal Medicine

## 2016-10-19 DIAGNOSIS — Z1231 Encounter for screening mammogram for malignant neoplasm of breast: Secondary | ICD-10-CM

## 2016-10-21 DIAGNOSIS — Z713 Dietary counseling and surveillance: Secondary | ICD-10-CM | POA: Diagnosis not present

## 2016-11-15 ENCOUNTER — Ambulatory Visit (HOSPITAL_COMMUNITY): Payer: Medicare Other

## 2016-11-22 DIAGNOSIS — Z9889 Other specified postprocedural states: Secondary | ICD-10-CM | POA: Insufficient documentation

## 2016-11-22 DIAGNOSIS — G8929 Other chronic pain: Secondary | ICD-10-CM | POA: Diagnosis not present

## 2016-11-22 DIAGNOSIS — M19011 Primary osteoarthritis, right shoulder: Secondary | ICD-10-CM | POA: Diagnosis not present

## 2016-11-22 DIAGNOSIS — M25511 Pain in right shoulder: Secondary | ICD-10-CM | POA: Diagnosis not present

## 2016-11-27 ENCOUNTER — Emergency Department (HOSPITAL_COMMUNITY): Payer: Medicare Other

## 2016-11-27 ENCOUNTER — Emergency Department (HOSPITAL_COMMUNITY)
Admission: EM | Admit: 2016-11-27 | Discharge: 2016-11-27 | Disposition: A | Discharge status: Home / Self Care | Payer: Medicare Other | Attending: Emergency Medicine | Admitting: Emergency Medicine

## 2016-11-27 ENCOUNTER — Encounter (HOSPITAL_COMMUNITY): Payer: Self-pay | Admitting: Emergency Medicine

## 2016-11-27 DIAGNOSIS — J45909 Unspecified asthma, uncomplicated: Secondary | ICD-10-CM | POA: Insufficient documentation

## 2016-11-27 DIAGNOSIS — Z79899 Other long term (current) drug therapy: Secondary | ICD-10-CM | POA: Insufficient documentation

## 2016-11-27 DIAGNOSIS — M7731 Calcaneal spur, right foot: Secondary | ICD-10-CM

## 2016-11-27 DIAGNOSIS — M79671 Pain in right foot: Secondary | ICD-10-CM | POA: Diagnosis not present

## 2016-11-27 MED ORDER — IBUPROFEN 800 MG PO TABS: 800.0000 mg | ORAL_TABLET | Freq: Three times a day (TID) | ORAL | 0 refills | Status: DC

## 2016-11-27 MED ORDER — IBUPROFEN 800 MG PO TABS
800.0000 mg | ORAL_TABLET | Freq: Once | ORAL | Status: AC
  Administered 2016-11-27: 800 mg via ORAL
  Filled 2016-11-27: qty 1

## 2016-11-27 NOTE — Discharge Instructions (Signed)
Your symptoms are from a heel spur as you suspected. The best initial treatment is placing a shoe insert in your shoes to take the pressure off this site and gentle stretching exercises as demonstrated.  Call your orthopedist if you continue to have problems.

## 2016-11-27 NOTE — ED Triage Notes (Signed)
Pt c/o right heel pain x 3 weeks intermittent. Has not tried anything for pain. Denies injury. Nad.

## 2016-11-28 NOTE — ED Provider Notes (Signed)
[facility] DEPT Provider Note   CSN: [id number] Arrival date & time: 11/27/16  1028     History   Chief Complaint Chief Complaint  Patient presents with  . Foot Pain    HPI Jessica Stevens is a 51 y.o. female with a history of significant osteoarthritis, currently under the care of an orthopedist in [place], presenting with right heel pain which has been presenting for the past 3+ weeks, worsened with weight bearing but also endorses pain with ankle flexion.  She denies injury.  She describes a sensation of stepping on a pebble at times, but there is no pebble present when checked. She has found no alleviators for this complaint, no treatment prior to arrival.  HPI  Past Medical History:  Diagnosis Date  . Ankle pain, left   . Arthritis of knee, degenerative   . Asthma   . Carpal tunnel syndrome   . Cervical radiculopathy   . Chronic back pain   . Complete rupture of rotator cuff   . Complete rupture of rotator cuff   . Depression   . Gynecomastia   . HNP (herniated nucleus pulposus), cervical   . HNP (herniated nucleus pulposus), cervical   . Hyperlipidemia   . Knee pain   . OA (osteoarthritis) of knee   . Obesity   . Urinary incontinence     Patient Active Problem List   Diagnosis Date Noted  . Guaiac positive stools 12/30/2013  . GERD 03/25/2010  . GUAIAC POSITIVE STOOL 03/25/2010  . NECK PAIN, CHRONIC 03/25/2010  . HEADACHE 12/30/2009  . IMPAIRED FASTING GLUCOSE 12/30/2009  . ROM 11/26/2009  . TINEA CRURIS 06/29/2009  . CANDIDIASIS 06/29/2009  . ECZEMA 03/17/2009  . CARPAL TUNNEL SYNDROME 12/15/2008  . INSOMNIA 09/13/2008  . BACK PAIN, LUMBAR, WITH RADICULOPATHY 09/09/2008  . HYPERLIPIDEMIA 08/09/2008  . FOOT PAIN, BILATERAL 08/04/2008  . KNEE, ARTHRITIS, DEGEN./OSTEO 07/29/2008  . FATIGUE 05/18/2008  . ALLERGIC RHINITIS 10/17/2007  . SHOULDER PAIN, BILATERAL 10/17/2007  . CERVICAL RADICULOPATHY 10/07/2007  . ANKLE PAIN, LEFT 08/19/2007  .  OBESITY, UNSPECIFIED 06/25/2007  . DEPRESSION 06/25/2007  . ASTHMA, UNSPECIFIED, UNSPECIFIED STATUS 06/25/2007  . GYNECOMASTIA 06/25/2007  . URINARY INCONTINENCE 06/25/2007    Past Surgical History:  Procedure Laterality Date  . arthroscopy  left knee  08/14/2008   Dr. Aline Brochure   . COLONOSCOPY N/A 03/11/2014   Procedure: COLONOSCOPY;  Surgeon: Rogene Houston, MD;  Location: [facility];  Service: Endoscopy;  Laterality: N/A;  730 - moved to 1/13 @ 12:00  . PARTIAL HYSTERECTOMY    . tube insert in right ear  2003    OB History    No data available       Home Medications    Prior to Admission medications   Medication Sig Start Date End Date Taking? Authorizing Provider  acetaminophen (TYLENOL) 500 MG tablet Take 500 mg by mouth every 6 (six) hours as needed.    [provider]  albuterol (PROVENTIL HFA;VENTOLIN HFA) 108 (90 BASE) MCG/ACT inhaler Inhale into the lungs every 6 (six) hours as needed for wheezing or shortness of breath.    [provider]  cyclobenzaprine (FLEXERIL) 10 MG tablet Take 10 mg by mouth 3 (three) times daily as needed for muscle spasms.    [provider]  DULoxetine (CYMBALTA) 30 MG capsule Take 30 mg by mouth daily.    [provider]  HYDROcodone-acetaminophen (NORCO/VICODIN) 5-325 MG per tablet Take 1-2 tablets by mouth every 4 (  four) hours as needed. 08/20/14   Glendell Docker, NP  ibuprofen (ADVIL,MOTRIN) 800 MG tablet Take 1 tablet (800 mg total) by mouth 3 (three) times daily. 11/27/16   Evalee Jefferson, PA-C  levocetirizine (XYZAL) 5 MG tablet Take 5 mg by mouth every evening.    [provider]  meloxicam (MOBIC) 15 MG tablet Take 15 mg by mouth daily.    [provider]  methocarbamol (ROBAXIN) 500 MG tablet Take 500 mg by mouth 4 (four) times daily.    [provider]  montelukast (SINGULAIR) 10 MG tablet Take 10 mg by mouth at bedtime.    [provider]  pantoprazole  (PROTONIX) 40 MG tablet Take 40 mg by mouth daily.    [provider]    Family History Family History  Problem Relation Age of Onset  . Diabetes Mother   . Hypertension Mother   . Cancer Father        left nephrectomy   . Hypertension Father   . Hypertension Sister   . Diabetes Maternal Grandmother   . Hypertension Maternal Grandmother   . Hypertension Maternal Grandfather     Social History Social History  Substance Use Topics  . Smoking status: Never Smoker  . Smokeless tobacco: Never Used  . Alcohol use No     Allergies   Penicillins   Review of Systems Review of Systems  Constitutional: Negative for fever.  Musculoskeletal: Positive for arthralgias. Negative for joint swelling and myalgias.  Neurological: Negative for weakness and numbness.     Physical Exam Updated Vital Signs BP (!) 145/88   Pulse (P) 68   Temp (P) 98 F (36.7 C) (Oral)   Resp (P) 18   Ht 5\' 3"  (1.6 m)   Wt 110.7 kg (244 lb)   SpO2 (P) 99%   BMI 43.22 kg/m   Physical Exam  Constitutional: She appears well-developed and well-nourished.  HENT:  Head: Atraumatic.  Neck: Normal range of motion.  Cardiovascular:  Pulses equal bilaterally  Musculoskeletal: She exhibits tenderness.       Right foot: There is tenderness.       Feet:  Neurological: She is alert. She has normal strength. She displays normal reflexes. No sensory deficit.  Skin: Skin is warm and dry.  Psychiatric: She has a normal mood and affect.     ED Treatments / Results  Labs (all labs ordered are listed, but only abnormal results are displayed) Labs Reviewed - No data to display  EKG  EKG Interpretation None       Radiology Dg Foot Complete Right  Result Date: 11/27/2016 CLINICAL DATA:  Right heel pain for several weeks without known injury. EXAM: RIGHT FOOT COMPLETE - 3+ VIEW COMPARISON:  None. FINDINGS: There is no evidence of fracture or dislocation. There is no evidence of arthropathy.  Mild posterior calcaneal spurring. Soft tissues are unremarkable. IMPRESSION: Mild posterior calcaneal spurring. No other abnormality seen in the right foot. Electronically Signed   By: Marijo Conception, M.D.   On: 11/27/2016 11:57    Procedures Procedures (including critical care time)  Medications Ordered in ED Medications  ibuprofen (ADVIL,MOTRIN) tablet 800 mg (800 mg Oral Given 11/27/16 1238)     Initial Impression / Assessment and Plan / ED Course  I have reviewed the triage vital signs and the nursing notes.  Pertinent labs & imaging results that were available during my care of the patient were reviewed by me and considered in my medical decision  making (see chart for details).     Heel spur with probable plantar fasciitis.  Discussed ice, heat, stretching exercises, ibuprofen, heel spur shoe insert.  Advised f/u with her orthopedist for further management.  Final Clinical Impressions(s) / ED Diagnoses   Final diagnoses:  Heel spur, right    New Prescriptions Discharge Medication List as of 11/27/2016 12:24 PM       Evalee Jefferson, PA-C 11/28/16 1049    Mesner, Corene Cornea, MD 11/28/16 1645

## 2016-12-01 DIAGNOSIS — M25774 Osteophyte, right foot: Secondary | ICD-10-CM | POA: Diagnosis not present

## 2016-12-19 DIAGNOSIS — M7731 Calcaneal spur, right foot: Secondary | ICD-10-CM | POA: Diagnosis not present

## 2017-01-25 DIAGNOSIS — G2581 Restless legs syndrome: Secondary | ICD-10-CM | POA: Diagnosis not present

## 2017-01-25 DIAGNOSIS — E039 Hypothyroidism, unspecified: Secondary | ICD-10-CM | POA: Diagnosis not present

## 2017-01-25 DIAGNOSIS — R7301 Impaired fasting glucose: Secondary | ICD-10-CM | POA: Diagnosis not present

## 2017-02-22 DIAGNOSIS — J45909 Unspecified asthma, uncomplicated: Secondary | ICD-10-CM | POA: Diagnosis not present

## 2017-02-22 DIAGNOSIS — E782 Mixed hyperlipidemia: Secondary | ICD-10-CM | POA: Diagnosis not present

## 2017-03-01 DIAGNOSIS — M79671 Pain in right foot: Secondary | ICD-10-CM | POA: Diagnosis not present

## 2017-03-01 DIAGNOSIS — M722 Plantar fascial fibromatosis: Secondary | ICD-10-CM | POA: Diagnosis not present

## 2017-03-08 DIAGNOSIS — J309 Allergic rhinitis, unspecified: Secondary | ICD-10-CM | POA: Diagnosis not present

## 2017-03-08 DIAGNOSIS — M19019 Primary osteoarthritis, unspecified shoulder: Secondary | ICD-10-CM | POA: Diagnosis not present

## 2017-04-04 DIAGNOSIS — M545 Low back pain: Secondary | ICD-10-CM | POA: Diagnosis not present

## 2017-04-05 DIAGNOSIS — M79671 Pain in right foot: Secondary | ICD-10-CM | POA: Diagnosis not present

## 2017-04-05 DIAGNOSIS — M722 Plantar fascial fibromatosis: Secondary | ICD-10-CM | POA: Diagnosis not present

## 2017-04-18 DIAGNOSIS — M545 Low back pain: Secondary | ICD-10-CM | POA: Diagnosis not present

## 2017-04-26 DIAGNOSIS — M7661 Achilles tendinitis, right leg: Secondary | ICD-10-CM | POA: Diagnosis not present

## 2017-04-26 DIAGNOSIS — M79671 Pain in right foot: Secondary | ICD-10-CM | POA: Diagnosis not present

## 2017-05-07 DIAGNOSIS — E039 Hypothyroidism, unspecified: Secondary | ICD-10-CM | POA: Diagnosis not present

## 2017-05-07 DIAGNOSIS — E782 Mixed hyperlipidemia: Secondary | ICD-10-CM | POA: Diagnosis not present

## 2017-05-07 DIAGNOSIS — R7301 Impaired fasting glucose: Secondary | ICD-10-CM | POA: Diagnosis not present

## 2017-05-07 DIAGNOSIS — I10 Essential (primary) hypertension: Secondary | ICD-10-CM | POA: Diagnosis not present

## 2017-05-10 DIAGNOSIS — M545 Low back pain: Secondary | ICD-10-CM | POA: Diagnosis not present

## 2017-05-10 DIAGNOSIS — E039 Hypothyroidism, unspecified: Secondary | ICD-10-CM | POA: Diagnosis not present

## 2017-05-10 DIAGNOSIS — J45902 Unspecified asthma with status asthmaticus: Secondary | ICD-10-CM | POA: Diagnosis not present

## 2017-05-10 DIAGNOSIS — E782 Mixed hyperlipidemia: Secondary | ICD-10-CM | POA: Diagnosis not present

## 2017-05-16 DIAGNOSIS — M79671 Pain in right foot: Secondary | ICD-10-CM | POA: Diagnosis not present

## 2017-05-16 DIAGNOSIS — G5750 Tarsal tunnel syndrome, unspecified lower limb: Secondary | ICD-10-CM | POA: Diagnosis not present

## 2017-06-06 DIAGNOSIS — G5750 Tarsal tunnel syndrome, unspecified lower limb: Secondary | ICD-10-CM | POA: Diagnosis not present

## 2017-06-06 DIAGNOSIS — M79671 Pain in right foot: Secondary | ICD-10-CM | POA: Diagnosis not present

## 2017-06-18 DIAGNOSIS — E782 Mixed hyperlipidemia: Secondary | ICD-10-CM | POA: Diagnosis not present

## 2017-06-18 DIAGNOSIS — E039 Hypothyroidism, unspecified: Secondary | ICD-10-CM | POA: Diagnosis not present

## 2017-06-18 DIAGNOSIS — D509 Iron deficiency anemia, unspecified: Secondary | ICD-10-CM | POA: Diagnosis not present

## 2017-06-21 DIAGNOSIS — J45902 Unspecified asthma with status asthmaticus: Secondary | ICD-10-CM | POA: Diagnosis not present

## 2017-06-21 DIAGNOSIS — M19019 Primary osteoarthritis, unspecified shoulder: Secondary | ICD-10-CM | POA: Diagnosis not present

## 2017-06-21 DIAGNOSIS — M545 Low back pain: Secondary | ICD-10-CM | POA: Diagnosis not present

## 2017-06-21 DIAGNOSIS — G894 Chronic pain syndrome: Secondary | ICD-10-CM | POA: Diagnosis not present

## 2017-06-21 DIAGNOSIS — E039 Hypothyroidism, unspecified: Secondary | ICD-10-CM | POA: Diagnosis not present

## 2017-06-29 DIAGNOSIS — H52223 Regular astigmatism, bilateral: Secondary | ICD-10-CM | POA: Diagnosis not present

## 2017-06-29 DIAGNOSIS — H524 Presbyopia: Secondary | ICD-10-CM | POA: Diagnosis not present

## 2017-07-30 DIAGNOSIS — E782 Mixed hyperlipidemia: Secondary | ICD-10-CM | POA: Diagnosis not present

## 2017-07-30 DIAGNOSIS — E119 Type 2 diabetes mellitus without complications: Secondary | ICD-10-CM | POA: Diagnosis not present

## 2017-08-02 DIAGNOSIS — E782 Mixed hyperlipidemia: Secondary | ICD-10-CM | POA: Diagnosis not present

## 2017-08-02 DIAGNOSIS — J45901 Unspecified asthma with (acute) exacerbation: Secondary | ICD-10-CM | POA: Diagnosis not present

## 2017-08-02 DIAGNOSIS — M1990 Unspecified osteoarthritis, unspecified site: Secondary | ICD-10-CM | POA: Diagnosis not present

## 2017-08-29 DIAGNOSIS — N62 Hypertrophy of breast: Secondary | ICD-10-CM | POA: Diagnosis not present

## 2017-09-06 DIAGNOSIS — E039 Hypothyroidism, unspecified: Secondary | ICD-10-CM | POA: Diagnosis not present

## 2017-09-06 DIAGNOSIS — G894 Chronic pain syndrome: Secondary | ICD-10-CM | POA: Diagnosis not present

## 2017-09-06 DIAGNOSIS — D509 Iron deficiency anemia, unspecified: Secondary | ICD-10-CM | POA: Diagnosis not present

## 2017-09-06 DIAGNOSIS — E782 Mixed hyperlipidemia: Secondary | ICD-10-CM | POA: Diagnosis not present

## 2017-09-06 DIAGNOSIS — Z713 Dietary counseling and surveillance: Secondary | ICD-10-CM | POA: Diagnosis not present

## 2017-09-10 ENCOUNTER — Ambulatory Visit (HOSPITAL_COMMUNITY)
Admission: RE | Admit: 2017-09-10 | Discharge: 2017-09-10 | Disposition: A | Discharge status: Home / Self Care | Payer: Medicare Other | Source: Ambulatory Visit | Attending: Internal Medicine | Admitting: Internal Medicine

## 2017-09-10 ENCOUNTER — Encounter (HOSPITAL_COMMUNITY): Payer: Self-pay

## 2017-09-10 DIAGNOSIS — Z1231 Encounter for screening mammogram for malignant neoplasm of breast: Secondary | ICD-10-CM | POA: Diagnosis not present

## 2017-09-21 ENCOUNTER — Institutional Professional Consult (permissible substitution): Payer: Medicare Other | Admitting: Internal Medicine

## 2017-10-04 ENCOUNTER — Institutional Professional Consult (permissible substitution): Payer: Medicare Other | Admitting: Pulmonary Disease

## 2017-10-11 DIAGNOSIS — E039 Hypothyroidism, unspecified: Secondary | ICD-10-CM | POA: Diagnosis not present

## 2017-10-18 DIAGNOSIS — M170 Bilateral primary osteoarthritis of knee: Secondary | ICD-10-CM | POA: Diagnosis not present

## 2017-10-18 DIAGNOSIS — D1721 Benign lipomatous neoplasm of skin and subcutaneous tissue of right arm: Secondary | ICD-10-CM | POA: Diagnosis not present

## 2017-10-18 DIAGNOSIS — M25569 Pain in unspecified knee: Secondary | ICD-10-CM | POA: Diagnosis not present

## 2017-10-18 DIAGNOSIS — E039 Hypothyroidism, unspecified: Secondary | ICD-10-CM | POA: Diagnosis not present

## 2017-10-18 DIAGNOSIS — M25511 Pain in right shoulder: Secondary | ICD-10-CM | POA: Diagnosis not present

## 2017-10-24 DIAGNOSIS — N62 Hypertrophy of breast: Secondary | ICD-10-CM | POA: Diagnosis not present

## 2017-10-30 ENCOUNTER — Institutional Professional Consult (permissible substitution): Payer: Medicare Other | Admitting: Pulmonary Disease

## 2017-11-12 DIAGNOSIS — N62 Hypertrophy of breast: Secondary | ICD-10-CM | POA: Diagnosis not present

## 2017-11-22 ENCOUNTER — Other Ambulatory Visit: Payer: Self-pay | Admitting: Plastic Surgery

## 2017-11-22 DIAGNOSIS — N6011 Diffuse cystic mastopathy of right breast: Secondary | ICD-10-CM | POA: Diagnosis not present

## 2017-11-22 DIAGNOSIS — N62 Hypertrophy of breast: Secondary | ICD-10-CM | POA: Diagnosis not present

## 2017-11-22 DIAGNOSIS — M546 Pain in thoracic spine: Secondary | ICD-10-CM | POA: Diagnosis not present

## 2017-11-22 DIAGNOSIS — D242 Benign neoplasm of left breast: Secondary | ICD-10-CM | POA: Diagnosis not present

## 2017-11-29 DIAGNOSIS — Z0000 Encounter for general adult medical examination without abnormal findings: Secondary | ICD-10-CM | POA: Diagnosis not present

## 2017-12-24 DIAGNOSIS — R7301 Impaired fasting glucose: Secondary | ICD-10-CM | POA: Diagnosis not present

## 2017-12-24 DIAGNOSIS — E039 Hypothyroidism, unspecified: Secondary | ICD-10-CM | POA: Diagnosis not present

## 2017-12-24 DIAGNOSIS — M542 Cervicalgia: Secondary | ICD-10-CM | POA: Diagnosis not present

## 2017-12-24 DIAGNOSIS — D1721 Benign lipomatous neoplasm of skin and subcutaneous tissue of right arm: Secondary | ICD-10-CM | POA: Diagnosis not present

## 2017-12-24 DIAGNOSIS — D509 Iron deficiency anemia, unspecified: Secondary | ICD-10-CM | POA: Diagnosis not present

## 2017-12-24 DIAGNOSIS — M25512 Pain in left shoulder: Secondary | ICD-10-CM | POA: Diagnosis not present

## 2017-12-24 DIAGNOSIS — G4730 Sleep apnea, unspecified: Secondary | ICD-10-CM | POA: Diagnosis not present

## 2018-01-08 ENCOUNTER — Ambulatory Visit: Payer: Medicare Other | Attending: Internal Medicine | Admitting: Neurology

## 2018-01-08 DIAGNOSIS — G4733 Obstructive sleep apnea (adult) (pediatric): Secondary | ICD-10-CM | POA: Diagnosis not present

## 2018-01-11 DIAGNOSIS — J45909 Unspecified asthma, uncomplicated: Secondary | ICD-10-CM | POA: Diagnosis not present

## 2018-01-11 DIAGNOSIS — M545 Low back pain: Secondary | ICD-10-CM | POA: Diagnosis not present

## 2018-01-11 DIAGNOSIS — J019 Acute sinusitis, unspecified: Secondary | ICD-10-CM | POA: Diagnosis not present

## 2018-01-11 DIAGNOSIS — J309 Allergic rhinitis, unspecified: Secondary | ICD-10-CM | POA: Diagnosis not present

## 2018-01-11 DIAGNOSIS — M19019 Primary osteoarthritis, unspecified shoulder: Secondary | ICD-10-CM | POA: Diagnosis not present

## 2018-01-11 NOTE — Procedures (Signed)
[place] A. Merlene Laughter, MD     [email or web address]             NOCTURNAL POLYSOMNOGRAPHY   LOCATION: [facility]  Patient Name: Jessica Stevens, Jessica Stevens Date: 01/08/2018 Gender: Female D.O.B: 10-20-1965 Age (years): 51 Referring Provider: Delphina Cahill Height (inches): 66 Interpreting Physician: Phillips Odor MD, ABSM Weight (lbs): 262 RPSGT: Rosebud Poles BMI: 42 MRN: [id number] Neck Size: 15.00 CLINICAL INFORMATION Sleep Study Type: NPSG     Indication for sleep study: OSA     Epworth Sleepiness Score: 4     SLEEP STUDY TECHNIQUE As per the AASM Manual for the Scoring of Sleep and Associated Events v2.3 (April 2016) with a hypopnea requiring 4% desaturations.  The channels recorded and monitored were frontal, central and occipital EEG, electrooculogram (EOG), submentalis EMG (chin), nasal and oral airflow, thoracic and abdominal wall motion, anterior tibialis EMG, snore microphone, electrocardiogram, and pulse oximetry.  MEDICATIONS Medications self-administered by patient taken the night of the study : N/A  Current Outpatient Medications:  .  acetaminophen (TYLENOL) 500 MG tablet, Take 500 mg by mouth every 6 (six) hours as needed., Disp: , Rfl:  .  albuterol (PROVENTIL HFA;VENTOLIN HFA) 108 (90 BASE) MCG/ACT inhaler, Inhale into the lungs every 6 (six) hours as needed for wheezing or shortness of breath., Disp: , Rfl:  .  cyclobenzaprine (FLEXERIL) 10 MG tablet, Take 10 mg by mouth 3 (three) times daily as needed for muscle spasms., Disp: , Rfl:  .  DULoxetine (CYMBALTA) 30 MG capsule, Take 30 mg by mouth daily., Disp: , Rfl:  .  HYDROcodone-acetaminophen (NORCO/VICODIN) 5-325 MG per tablet, Take 1-2 tablets by mouth every 4 (four) hours as needed., Disp: 10 tablet, Rfl: 0 .  ibuprofen (ADVIL,MOTRIN) 800 MG tablet, Take 1 tablet (800 mg total) by mouth 3 (three) times daily., Disp: 21 tablet, Rfl: 0 .  levocetirizine (XYZAL) 5 MG tablet, Take 5 mg  by mouth every evening., Disp: , Rfl:  .  meloxicam (MOBIC) 15 MG tablet, Take 15 mg by mouth daily., Disp: , Rfl:  .  methocarbamol (ROBAXIN) 500 MG tablet, Take 500 mg by mouth 4 (four) times daily., Disp: , Rfl:  .  montelukast (SINGULAIR) 10 MG tablet, Take 10 mg by mouth at bedtime., Disp: , Rfl:  .  pantoprazole (PROTONIX) 40 MG tablet, Take 40 mg by mouth daily., Disp: , Rfl:      SLEEP ARCHITECTURE The study was initiated at 10:24:02 PM and ended at 5:02:30 AM.  Sleep onset time was 82.2 minutes and the sleep efficiency was 60.4%%. The total sleep time was 240.5 minutes.  Stage REM latency was N/A minutes.  The patient spent 7.5%% of the night in stage N1 sleep, 78.0%% in stage N2 sleep, 14.6%% in stage N3 and 0% in REM.  Alpha intrusion was absent.  Supine sleep was 0.00%.  RESPIRATORY PARAMETERS The overall apnea/hypopnea index (AHI) was 0.0 per hour. There were 0 total apneas, including 0 obstructive, 0 central and 0 mixed apneas. There were 0 hypopneas and 0 RERAs.  The AHI during Stage REM sleep was N/A per hour.  AHI while supine was N/A per hour.  The mean oxygen saturation was 94.3%. The minimum SpO2 during sleep was 92.0%.  moderate snoring was noted during this study.  CARDIAC DATA The 2 lead EKG demonstrated sinus rhythm. The mean heart rate was 71.2 beats per minute. Other EKG findings include: None.  LEG MOVEMENT DATA Mild-to-moderate periodic limb movement is observed.  IMPRESSIONS  1.  Mild to moderate periodic limb movement disorder is observed. 2.  Absent REM sleep is also observed. 3.  There is no evidence of sleep disordered breathing.   Delano Metz, MD Diplomate, American Board of Sleep Medicine.  ELECTRONICALLY SIGNED ON:  01/11/2018, 5:54 PM [place] PH: [phone]   FX: [phone] [place]

## 2018-01-16 DIAGNOSIS — M792 Neuralgia and neuritis, unspecified: Secondary | ICD-10-CM | POA: Diagnosis not present

## 2018-01-16 DIAGNOSIS — M71311 Other bursal cyst, right shoulder: Secondary | ICD-10-CM | POA: Diagnosis not present

## 2018-01-16 DIAGNOSIS — M12811 Other specific arthropathies, not elsewhere classified, right shoulder: Secondary | ICD-10-CM | POA: Insufficient documentation

## 2018-01-16 DIAGNOSIS — M1712 Unilateral primary osteoarthritis, left knee: Secondary | ICD-10-CM | POA: Insufficient documentation

## 2018-02-12 DIAGNOSIS — B360 Pityriasis versicolor: Secondary | ICD-10-CM | POA: Diagnosis not present

## 2018-04-03 DIAGNOSIS — M12811 Other specific arthropathies, not elsewhere classified, right shoulder: Secondary | ICD-10-CM | POA: Diagnosis not present

## 2018-04-03 DIAGNOSIS — M792 Neuralgia and neuritis, unspecified: Secondary | ICD-10-CM | POA: Diagnosis not present

## 2018-04-03 DIAGNOSIS — M71311 Other bursal cyst, right shoulder: Secondary | ICD-10-CM | POA: Diagnosis not present

## 2018-04-10 DIAGNOSIS — E039 Hypothyroidism, unspecified: Secondary | ICD-10-CM | POA: Diagnosis not present

## 2018-04-10 DIAGNOSIS — D509 Iron deficiency anemia, unspecified: Secondary | ICD-10-CM | POA: Diagnosis not present

## 2018-04-10 DIAGNOSIS — I10 Essential (primary) hypertension: Secondary | ICD-10-CM | POA: Diagnosis not present

## 2018-04-10 DIAGNOSIS — R7301 Impaired fasting glucose: Secondary | ICD-10-CM | POA: Diagnosis not present

## 2018-04-15 DIAGNOSIS — M542 Cervicalgia: Secondary | ICD-10-CM | POA: Diagnosis not present

## 2018-04-15 DIAGNOSIS — M47812 Spondylosis without myelopathy or radiculopathy, cervical region: Secondary | ICD-10-CM | POA: Diagnosis not present

## 2018-04-15 DIAGNOSIS — Q761 Klippel-Feil syndrome: Secondary | ICD-10-CM | POA: Diagnosis not present

## 2018-04-15 DIAGNOSIS — M5033 Other cervical disc degeneration, cervicothoracic region: Secondary | ICD-10-CM | POA: Diagnosis not present

## 2018-04-15 DIAGNOSIS — M4802 Spinal stenosis, cervical region: Secondary | ICD-10-CM | POA: Diagnosis not present

## 2018-04-17 DIAGNOSIS — E039 Hypothyroidism, unspecified: Secondary | ICD-10-CM | POA: Diagnosis not present

## 2018-04-17 DIAGNOSIS — R7301 Impaired fasting glucose: Secondary | ICD-10-CM | POA: Diagnosis not present

## 2018-04-17 DIAGNOSIS — K219 Gastro-esophageal reflux disease without esophagitis: Secondary | ICD-10-CM | POA: Diagnosis not present

## 2018-04-17 DIAGNOSIS — E782 Mixed hyperlipidemia: Secondary | ICD-10-CM | POA: Diagnosis not present

## 2018-04-17 DIAGNOSIS — Z23 Encounter for immunization: Secondary | ICD-10-CM | POA: Diagnosis not present

## 2018-04-19 DIAGNOSIS — M19011 Primary osteoarthritis, right shoulder: Secondary | ICD-10-CM | POA: Diagnosis not present

## 2018-04-22 DIAGNOSIS — M792 Neuralgia and neuritis, unspecified: Secondary | ICD-10-CM | POA: Diagnosis not present

## 2018-04-24 DIAGNOSIS — N62 Hypertrophy of breast: Secondary | ICD-10-CM | POA: Diagnosis not present

## 2018-05-21 DIAGNOSIS — E782 Mixed hyperlipidemia: Secondary | ICD-10-CM | POA: Diagnosis not present

## 2018-05-21 DIAGNOSIS — E039 Hypothyroidism, unspecified: Secondary | ICD-10-CM | POA: Diagnosis not present

## 2018-05-21 DIAGNOSIS — J45909 Unspecified asthma, uncomplicated: Secondary | ICD-10-CM | POA: Diagnosis not present

## 2018-05-21 DIAGNOSIS — R7301 Impaired fasting glucose: Secondary | ICD-10-CM | POA: Diagnosis not present

## 2018-05-21 DIAGNOSIS — K219 Gastro-esophageal reflux disease without esophagitis: Secondary | ICD-10-CM | POA: Diagnosis not present

## 2018-05-29 DIAGNOSIS — R7301 Impaired fasting glucose: Secondary | ICD-10-CM | POA: Diagnosis not present

## 2018-05-29 DIAGNOSIS — E782 Mixed hyperlipidemia: Secondary | ICD-10-CM | POA: Diagnosis not present

## 2018-05-29 DIAGNOSIS — E039 Hypothyroidism, unspecified: Secondary | ICD-10-CM | POA: Diagnosis not present

## 2018-05-29 DIAGNOSIS — J45909 Unspecified asthma, uncomplicated: Secondary | ICD-10-CM | POA: Diagnosis not present

## 2018-05-29 DIAGNOSIS — K219 Gastro-esophageal reflux disease without esophagitis: Secondary | ICD-10-CM | POA: Diagnosis not present

## 2018-06-03 DIAGNOSIS — G4700 Insomnia, unspecified: Secondary | ICD-10-CM | POA: Diagnosis not present

## 2018-06-03 DIAGNOSIS — E039 Hypothyroidism, unspecified: Secondary | ICD-10-CM | POA: Diagnosis not present

## 2018-06-04 DIAGNOSIS — E782 Mixed hyperlipidemia: Secondary | ICD-10-CM | POA: Diagnosis not present

## 2018-06-04 DIAGNOSIS — E039 Hypothyroidism, unspecified: Secondary | ICD-10-CM | POA: Diagnosis not present

## 2018-07-02 DIAGNOSIS — R7301 Impaired fasting glucose: Secondary | ICD-10-CM | POA: Diagnosis not present

## 2018-07-02 DIAGNOSIS — E039 Hypothyroidism, unspecified: Secondary | ICD-10-CM | POA: Diagnosis not present

## 2018-07-02 DIAGNOSIS — E782 Mixed hyperlipidemia: Secondary | ICD-10-CM | POA: Diagnosis not present

## 2018-07-02 DIAGNOSIS — J45909 Unspecified asthma, uncomplicated: Secondary | ICD-10-CM | POA: Diagnosis not present

## 2018-07-02 DIAGNOSIS — K219 Gastro-esophageal reflux disease without esophagitis: Secondary | ICD-10-CM | POA: Diagnosis not present

## 2018-07-06 DIAGNOSIS — Z0000 Encounter for general adult medical examination without abnormal findings: Secondary | ICD-10-CM | POA: Diagnosis not present

## 2018-08-02 DIAGNOSIS — M1712 Unilateral primary osteoarthritis, left knee: Secondary | ICD-10-CM | POA: Diagnosis not present

## 2018-08-02 DIAGNOSIS — M19011 Primary osteoarthritis, right shoulder: Secondary | ICD-10-CM | POA: Diagnosis not present

## 2018-08-06 DIAGNOSIS — J45909 Unspecified asthma, uncomplicated: Secondary | ICD-10-CM | POA: Diagnosis not present

## 2018-08-06 DIAGNOSIS — E039 Hypothyroidism, unspecified: Secondary | ICD-10-CM | POA: Diagnosis not present

## 2018-08-06 DIAGNOSIS — R7301 Impaired fasting glucose: Secondary | ICD-10-CM | POA: Diagnosis not present

## 2018-08-06 DIAGNOSIS — K219 Gastro-esophageal reflux disease without esophagitis: Secondary | ICD-10-CM | POA: Diagnosis not present

## 2018-08-06 DIAGNOSIS — E782 Mixed hyperlipidemia: Secondary | ICD-10-CM | POA: Diagnosis not present

## 2018-08-28 DIAGNOSIS — M1712 Unilateral primary osteoarthritis, left knee: Secondary | ICD-10-CM | POA: Diagnosis not present

## 2018-08-30 DIAGNOSIS — E039 Hypothyroidism, unspecified: Secondary | ICD-10-CM | POA: Diagnosis not present

## 2018-08-30 DIAGNOSIS — J45909 Unspecified asthma, uncomplicated: Secondary | ICD-10-CM | POA: Diagnosis not present

## 2018-08-30 DIAGNOSIS — K219 Gastro-esophageal reflux disease without esophagitis: Secondary | ICD-10-CM | POA: Diagnosis not present

## 2018-08-30 DIAGNOSIS — E782 Mixed hyperlipidemia: Secondary | ICD-10-CM | POA: Diagnosis not present

## 2018-08-30 DIAGNOSIS — R7301 Impaired fasting glucose: Secondary | ICD-10-CM | POA: Diagnosis not present

## 2018-09-30 DIAGNOSIS — J45909 Unspecified asthma, uncomplicated: Secondary | ICD-10-CM | POA: Diagnosis not present

## 2018-09-30 DIAGNOSIS — E039 Hypothyroidism, unspecified: Secondary | ICD-10-CM | POA: Diagnosis not present

## 2018-09-30 DIAGNOSIS — R7301 Impaired fasting glucose: Secondary | ICD-10-CM | POA: Diagnosis not present

## 2018-09-30 DIAGNOSIS — K219 Gastro-esophageal reflux disease without esophagitis: Secondary | ICD-10-CM | POA: Diagnosis not present

## 2018-09-30 DIAGNOSIS — E782 Mixed hyperlipidemia: Secondary | ICD-10-CM | POA: Diagnosis not present

## 2018-10-25 DIAGNOSIS — M79642 Pain in left hand: Secondary | ICD-10-CM | POA: Diagnosis not present

## 2018-10-29 DIAGNOSIS — K219 Gastro-esophageal reflux disease without esophagitis: Secondary | ICD-10-CM | POA: Diagnosis not present

## 2018-10-29 DIAGNOSIS — M79642 Pain in left hand: Secondary | ICD-10-CM | POA: Diagnosis not present

## 2018-10-29 DIAGNOSIS — E039 Hypothyroidism, unspecified: Secondary | ICD-10-CM | POA: Diagnosis not present

## 2018-10-29 DIAGNOSIS — W230XXA Caught, crushed, jammed, or pinched between moving objects, initial encounter: Secondary | ICD-10-CM | POA: Diagnosis not present

## 2018-10-29 DIAGNOSIS — E782 Mixed hyperlipidemia: Secondary | ICD-10-CM | POA: Diagnosis not present

## 2018-10-29 DIAGNOSIS — S60052A Contusion of left little finger without damage to nail, initial encounter: Secondary | ICD-10-CM | POA: Diagnosis not present

## 2018-10-29 DIAGNOSIS — S60042A Contusion of left ring finger without damage to nail, initial encounter: Secondary | ICD-10-CM | POA: Diagnosis not present

## 2018-10-29 DIAGNOSIS — R7301 Impaired fasting glucose: Secondary | ICD-10-CM | POA: Diagnosis not present

## 2018-10-29 DIAGNOSIS — Z79899 Other long term (current) drug therapy: Secondary | ICD-10-CM | POA: Diagnosis not present

## 2018-10-29 DIAGNOSIS — J45909 Unspecified asthma, uncomplicated: Secondary | ICD-10-CM | POA: Diagnosis not present

## 2018-10-29 DIAGNOSIS — S60032A Contusion of left middle finger without damage to nail, initial encounter: Secondary | ICD-10-CM | POA: Diagnosis not present

## 2018-11-08 ENCOUNTER — Other Ambulatory Visit: Payer: Self-pay

## 2018-11-08 ENCOUNTER — Ambulatory Visit (INDEPENDENT_AMBULATORY_CARE_PROVIDER_SITE_OTHER): Payer: Medicare Other | Admitting: Podiatry

## 2018-11-08 VITALS — BP 138/81 | HR 70

## 2018-11-08 DIAGNOSIS — B351 Tinea unguium: Secondary | ICD-10-CM | POA: Diagnosis not present

## 2018-11-08 DIAGNOSIS — L603 Nail dystrophy: Secondary | ICD-10-CM | POA: Diagnosis not present

## 2018-11-08 DIAGNOSIS — M79676 Pain in unspecified toe(s): Secondary | ICD-10-CM | POA: Diagnosis not present

## 2018-11-20 DIAGNOSIS — M792 Neuralgia and neuritis, unspecified: Secondary | ICD-10-CM | POA: Diagnosis not present

## 2018-11-20 DIAGNOSIS — M1712 Unilateral primary osteoarthritis, left knee: Secondary | ICD-10-CM | POA: Diagnosis not present

## 2018-11-20 DIAGNOSIS — I10 Essential (primary) hypertension: Secondary | ICD-10-CM | POA: Diagnosis not present

## 2018-11-20 DIAGNOSIS — E7800 Pure hypercholesterolemia, unspecified: Secondary | ICD-10-CM | POA: Diagnosis not present

## 2018-11-20 DIAGNOSIS — M19011 Primary osteoarthritis, right shoulder: Secondary | ICD-10-CM | POA: Diagnosis not present

## 2018-11-20 DIAGNOSIS — G4489 Other headache syndrome: Secondary | ICD-10-CM | POA: Diagnosis not present

## 2018-11-20 DIAGNOSIS — M542 Cervicalgia: Secondary | ICD-10-CM | POA: Diagnosis not present

## 2018-11-20 DIAGNOSIS — J45909 Unspecified asthma, uncomplicated: Secondary | ICD-10-CM | POA: Diagnosis not present

## 2018-11-20 DIAGNOSIS — Z79899 Other long term (current) drug therapy: Secondary | ICD-10-CM | POA: Diagnosis not present

## 2018-11-20 DIAGNOSIS — M4802 Spinal stenosis, cervical region: Secondary | ICD-10-CM | POA: Diagnosis not present

## 2018-11-20 DIAGNOSIS — R51 Headache: Secondary | ICD-10-CM | POA: Diagnosis not present

## 2018-11-21 ENCOUNTER — Other Ambulatory Visit (HOSPITAL_COMMUNITY): Payer: Self-pay | Admitting: Nurse Practitioner

## 2018-11-21 ENCOUNTER — Other Ambulatory Visit: Payer: Self-pay

## 2018-11-21 ENCOUNTER — Ambulatory Visit (HOSPITAL_COMMUNITY)
Admission: RE | Admit: 2018-11-21 | Discharge: 2018-11-21 | Disposition: A | Discharge status: Home / Self Care | Payer: Medicare Other | Source: Ambulatory Visit | Attending: Nurse Practitioner | Admitting: Nurse Practitioner

## 2018-11-21 DIAGNOSIS — M79645 Pain in left finger(s): Secondary | ICD-10-CM

## 2018-11-21 DIAGNOSIS — Z2821 Immunization not carried out because of patient refusal: Secondary | ICD-10-CM | POA: Diagnosis not present

## 2018-11-21 DIAGNOSIS — R600 Localized edema: Secondary | ICD-10-CM | POA: Diagnosis not present

## 2018-11-21 DIAGNOSIS — G8929 Other chronic pain: Secondary | ICD-10-CM | POA: Diagnosis not present

## 2018-11-21 DIAGNOSIS — S6992XA Unspecified injury of left wrist, hand and finger(s), initial encounter: Secondary | ICD-10-CM | POA: Diagnosis not present

## 2018-11-21 DIAGNOSIS — M545 Low back pain: Secondary | ICD-10-CM | POA: Diagnosis not present

## 2018-11-27 NOTE — Progress Notes (Signed)
Subjective:  Patient ID: Jessica Stevens, female    DOB: [birth year],  MRN: [id number]  Chief Complaint  Patient presents with  . Nail Problem     left foot 2nd toe ingrown toenail :     53 y.o. female presents with the above complaint.  History above confirmed with patient.  States that the nails thickened and the others feels like it is growing into the end of her toe.  Causing her pain.   Review of Systems: Negative except as noted in the HPI. Denies N/V/F/Ch.  Past Medical History:  Diagnosis Date  . Ankle pain, left   . Arthritis of knee, degenerative   . Asthma   . Carpal tunnel syndrome   . Cervical radiculopathy   . Chronic back pain   . Complete rupture of rotator cuff   . Complete rupture of rotator cuff   . Depression   . Gynecomastia   . HNP (herniated nucleus pulposus), cervical   . HNP (herniated nucleus pulposus), cervical   . Hyperlipidemia   . Knee pain   . OA (osteoarthritis) of knee   . Obesity   . Urinary incontinence     Current Outpatient Medications:  .  diazepam (VALIUM) 5 MG tablet, Take by mouth., Disp: , Rfl:  .  ketoconazole (NIZORAL) 2 % cream, APPLY CREAM TOPICALLY TO AFFECTED AREA TWICE DAILY, Disp: , Rfl:  .  acetaminophen (TYLENOL) 500 MG tablet, Take 500 mg by mouth every 6 (six) hours as needed., Disp: , Rfl:  .  acetaminophen-codeine (TYLENOL #3) 300-30 MG tablet, , Disp: , Rfl:  .  albuterol (PROVENTIL HFA;VENTOLIN HFA) 108 (90 BASE) MCG/ACT inhaler, Inhale into the lungs every 6 (six) hours as needed for wheezing or shortness of breath., Disp: , Rfl:  .  Biotin 800 MCG TABS, Take by mouth., Disp: , Rfl:  .  buPROPion (WELLBUTRIN XL) 150 MG 24 hr tablet, , Disp: , Rfl:  .  clonazePAM (KLONOPIN) 0.5 MG tablet, Take by mouth., Disp: , Rfl:  .  Crisaborole 2 % OINT, Apply topically., Disp: , Rfl:  .  cyclobenzaprine (FLEXERIL) 10 MG tablet, Take 10 mg by mouth 3 (three) times daily as needed for muscle spasms., Disp: , Rfl:  .   DULoxetine (CYMBALTA) 30 MG capsule, Take 30 mg by mouth daily., Disp: , Rfl:  .  DULoxetine (CYMBALTA) 60 MG capsule, , Disp: , Rfl:  .  gabapentin (NEURONTIN) 300 MG capsule, , Disp: , Rfl:  .  HYDROcodone-acetaminophen (NORCO/VICODIN) 5-325 MG per tablet, Take 1-2 tablets by mouth every 4 (four) hours as needed., Disp: 10 tablet, Rfl: 0 .  ibuprofen (ADVIL,MOTRIN) 800 MG tablet, Take 1 tablet (800 mg total) by mouth 3 (three) times daily., Disp: 21 tablet, Rfl: 0 .  levocetirizine (XYZAL) 5 MG tablet, Take 5 mg by mouth every evening., Disp: , Rfl:  .  levothyroxine (SYNTHROID) 75 MCG tablet, , Disp: , Rfl:  .  Melatonin 10 MG CAPS, Take by mouth., Disp: , Rfl:  .  meloxicam (MOBIC) 15 MG tablet, Take 15 mg by mouth daily., Disp: , Rfl:  .  methocarbamol (ROBAXIN) 500 MG tablet, Take 500 mg by mouth 4 (four) times daily., Disp: , Rfl:  .  mometasone-formoterol (DULERA) 100-5 MCG/ACT AERO, Inhale into the lungs., Disp: , Rfl:  .  montelukast (SINGULAIR) 10 MG tablet, Take 10 mg by mouth at bedtime., Disp: , Rfl:  .  pantoprazole (PROTONIX) 40 MG tablet, Take 40 mg by mouth  daily., Disp: , Rfl:  .  phentermine (ADIPEX-P) 37.5 MG tablet, TAKE ONE TABLET BY MOUTH DAILY FOR WEIGHT LOSS., Disp: , Rfl:  .  pravastatin (PRAVACHOL) 20 MG tablet, , Disp: , Rfl:  .  predniSONE (DELTASONE) 10 MG tablet, TAKE 6 TABLETS ON DAY 1 BY MOUTH THEN TAKE 5 TABLETS ON DAY 2 THEN TAKE 4 TABLETS ON DAY 3 THEN TAKE 3 TABLETS ON DAY 4 THEN TAKE 2 TABLETS, Disp: , Rfl:  .  tiZANidine (ZANAFLEX) 2 MG tablet, , Disp: , Rfl:   Social History   Tobacco Use  Smoking Status Never Smoker  Smokeless Tobacco Never Used    Allergies  Allergen Reactions  . Oxycodone-Acetaminophen   . Penicillins Other (See Comments)    According to Allergy Test.  Patient states she is not allergic to this medication   Objective:   Vitals:   11/08/18 1047  BP: 138/81  Pulse: 70   There is no height or weight on file to calculate  BMI. Constitutional Well developed. Well nourished.  Vascular Dorsalis pedis pulses palpable bilaterally. Posterior tibial pulses palpable bilaterally. Capillary refill normal to all digits.  No cyanosis or clubbing noted. Pedal hair growth normal.  Neurologic Normal speech. Oriented to person, place, and time. Epicritic sensation to light touch grossly present bilaterally.  Dermatologic Nails left second toe thickened dystrophic No open wounds. No skin lesions.  Orthopedic: Normal joint ROM without pain or crepitus bilaterally. No visible deformities. No bony tenderness.   Radiographs: None Assessment:   1. Pain due to onychomycosis of nail   2. Nail dystrophy    Plan:  Patient was evaluated and treated and all questions answered.  Onychomycosis, onychodystrophy -Educated on etiology -Nail palliative debrided to patient relief.  Burred smooth.  Educated on self-care follow-up should pain persist  No follow-ups on file.

## 2018-11-29 DIAGNOSIS — E782 Mixed hyperlipidemia: Secondary | ICD-10-CM | POA: Diagnosis not present

## 2018-11-29 DIAGNOSIS — E039 Hypothyroidism, unspecified: Secondary | ICD-10-CM | POA: Diagnosis not present

## 2018-11-29 DIAGNOSIS — R7301 Impaired fasting glucose: Secondary | ICD-10-CM | POA: Diagnosis not present

## 2018-11-29 DIAGNOSIS — K219 Gastro-esophageal reflux disease without esophagitis: Secondary | ICD-10-CM | POA: Diagnosis not present

## 2018-11-29 DIAGNOSIS — J45909 Unspecified asthma, uncomplicated: Secondary | ICD-10-CM | POA: Diagnosis not present

## 2018-12-17 ENCOUNTER — Other Ambulatory Visit: Payer: Self-pay

## 2018-12-17 DIAGNOSIS — Z20822 Contact with and (suspected) exposure to covid-19: Secondary | ICD-10-CM

## 2018-12-19 LAB — NOVEL CORONAVIRUS, NAA: SARS-CoV-2, NAA: NOT DETECTED

## 2018-12-20 DIAGNOSIS — E782 Mixed hyperlipidemia: Secondary | ICD-10-CM | POA: Diagnosis not present

## 2018-12-20 DIAGNOSIS — D509 Iron deficiency anemia, unspecified: Secondary | ICD-10-CM | POA: Diagnosis not present

## 2018-12-20 DIAGNOSIS — R21 Rash and other nonspecific skin eruption: Secondary | ICD-10-CM | POA: Diagnosis not present

## 2018-12-20 DIAGNOSIS — E039 Hypothyroidism, unspecified: Secondary | ICD-10-CM | POA: Diagnosis not present

## 2018-12-20 DIAGNOSIS — R7301 Impaired fasting glucose: Secondary | ICD-10-CM | POA: Diagnosis not present

## 2018-12-24 ENCOUNTER — Telehealth: Payer: Self-pay | Admitting: General Practice

## 2018-12-24 NOTE — Telephone Encounter (Signed)
Negative COVID results given. Patient results "NOT Detected." Caller expressed understanding. ° °

## 2018-12-26 ENCOUNTER — Other Ambulatory Visit: Payer: Self-pay

## 2018-12-26 DIAGNOSIS — M19019 Primary osteoarthritis, unspecified shoulder: Secondary | ICD-10-CM | POA: Diagnosis not present

## 2018-12-26 DIAGNOSIS — J45909 Unspecified asthma, uncomplicated: Secondary | ICD-10-CM | POA: Diagnosis not present

## 2018-12-26 DIAGNOSIS — M545 Low back pain: Secondary | ICD-10-CM | POA: Diagnosis not present

## 2018-12-26 DIAGNOSIS — J309 Allergic rhinitis, unspecified: Secondary | ICD-10-CM | POA: Diagnosis not present

## 2018-12-26 NOTE — Patient Outreach (Signed)
[facility] [place] [facility]) Care Management  12/26/2018  Jessica Stevens 07-08-1965 [id number]   Medication Adherence call to [name] [facility]iant Voice message left with a call back number. [name] is showing past due on Lisinopril 10 mg under [place].   [place] Management Direct Dial [phone]  Fax [phone] [name]

## 2019-01-07 DIAGNOSIS — M79621 Pain in right upper arm: Secondary | ICD-10-CM | POA: Diagnosis not present

## 2019-01-07 DIAGNOSIS — M25511 Pain in right shoulder: Secondary | ICD-10-CM | POA: Diagnosis not present

## 2019-01-07 DIAGNOSIS — M25562 Pain in left knee: Secondary | ICD-10-CM | POA: Diagnosis not present

## 2019-01-07 DIAGNOSIS — G894 Chronic pain syndrome: Secondary | ICD-10-CM | POA: Diagnosis not present

## 2019-01-07 DIAGNOSIS — M545 Low back pain: Secondary | ICD-10-CM | POA: Diagnosis not present

## 2019-01-16 DIAGNOSIS — R7301 Impaired fasting glucose: Secondary | ICD-10-CM | POA: Diagnosis not present

## 2019-01-16 DIAGNOSIS — E039 Hypothyroidism, unspecified: Secondary | ICD-10-CM | POA: Diagnosis not present

## 2019-01-16 DIAGNOSIS — J45909 Unspecified asthma, uncomplicated: Secondary | ICD-10-CM | POA: Diagnosis not present

## 2019-01-16 DIAGNOSIS — K219 Gastro-esophageal reflux disease without esophagitis: Secondary | ICD-10-CM | POA: Diagnosis not present

## 2019-01-16 DIAGNOSIS — E782 Mixed hyperlipidemia: Secondary | ICD-10-CM | POA: Diagnosis not present

## 2019-01-31 DIAGNOSIS — E782 Mixed hyperlipidemia: Secondary | ICD-10-CM | POA: Diagnosis not present

## 2019-01-31 DIAGNOSIS — J45909 Unspecified asthma, uncomplicated: Secondary | ICD-10-CM | POA: Diagnosis not present

## 2019-02-07 DIAGNOSIS — M79621 Pain in right upper arm: Secondary | ICD-10-CM | POA: Diagnosis not present

## 2019-02-07 DIAGNOSIS — M545 Low back pain: Secondary | ICD-10-CM | POA: Diagnosis not present

## 2019-02-07 DIAGNOSIS — M25562 Pain in left knee: Secondary | ICD-10-CM | POA: Diagnosis not present

## 2019-02-07 DIAGNOSIS — M25511 Pain in right shoulder: Secondary | ICD-10-CM | POA: Diagnosis not present

## 2019-02-17 ENCOUNTER — Ambulatory Visit (HOSPITAL_COMMUNITY): Payer: Medicare Other | Admitting: Clinical

## 2019-02-17 ENCOUNTER — Telehealth (HOSPITAL_COMMUNITY): Payer: Self-pay | Admitting: Clinical

## 2019-02-17 ENCOUNTER — Encounter

## 2019-02-17 ENCOUNTER — Other Ambulatory Visit: Payer: Self-pay

## 2019-02-17 NOTE — Telephone Encounter (Signed)
The OPT therapist attempted text to session x2 at 11:00AM and 11:10AM. The patient did not respond to either attempt

## 2019-02-18 DIAGNOSIS — J4521 Mild intermittent asthma with (acute) exacerbation: Secondary | ICD-10-CM | POA: Diagnosis not present

## 2019-02-18 DIAGNOSIS — M25562 Pain in left knee: Secondary | ICD-10-CM | POA: Diagnosis not present

## 2019-02-18 DIAGNOSIS — H60391 Other infective otitis externa, right ear: Secondary | ICD-10-CM | POA: Diagnosis not present

## 2019-02-18 DIAGNOSIS — R062 Wheezing: Secondary | ICD-10-CM | POA: Diagnosis not present

## 2019-02-19 DIAGNOSIS — M792 Neuralgia and neuritis, unspecified: Secondary | ICD-10-CM | POA: Diagnosis not present

## 2019-02-19 DIAGNOSIS — M19011 Primary osteoarthritis, right shoulder: Secondary | ICD-10-CM | POA: Diagnosis not present

## 2019-02-19 DIAGNOSIS — M1712 Unilateral primary osteoarthritis, left knee: Secondary | ICD-10-CM | POA: Diagnosis not present

## 2019-03-03 DIAGNOSIS — E7849 Other hyperlipidemia: Secondary | ICD-10-CM | POA: Diagnosis not present

## 2019-03-03 DIAGNOSIS — E782 Mixed hyperlipidemia: Secondary | ICD-10-CM | POA: Diagnosis not present

## 2019-03-04 DIAGNOSIS — M25562 Pain in left knee: Secondary | ICD-10-CM | POA: Diagnosis not present

## 2019-03-04 DIAGNOSIS — H60391 Other infective otitis externa, right ear: Secondary | ICD-10-CM | POA: Diagnosis not present

## 2019-03-04 DIAGNOSIS — M25512 Pain in left shoulder: Secondary | ICD-10-CM | POA: Diagnosis not present

## 2019-03-19 DIAGNOSIS — M545 Low back pain: Secondary | ICD-10-CM | POA: Diagnosis not present

## 2019-03-19 DIAGNOSIS — M25562 Pain in left knee: Secondary | ICD-10-CM | POA: Diagnosis not present

## 2019-03-19 DIAGNOSIS — M25511 Pain in right shoulder: Secondary | ICD-10-CM | POA: Diagnosis not present

## 2019-03-19 DIAGNOSIS — M79621 Pain in right upper arm: Secondary | ICD-10-CM | POA: Diagnosis not present

## 2019-04-02 DIAGNOSIS — D519 Vitamin B12 deficiency anemia, unspecified: Secondary | ICD-10-CM | POA: Diagnosis not present

## 2019-04-02 DIAGNOSIS — G4730 Sleep apnea, unspecified: Secondary | ICD-10-CM | POA: Diagnosis not present

## 2019-04-02 DIAGNOSIS — M25512 Pain in left shoulder: Secondary | ICD-10-CM | POA: Diagnosis not present

## 2019-04-02 DIAGNOSIS — E039 Hypothyroidism, unspecified: Secondary | ICD-10-CM | POA: Diagnosis not present

## 2019-04-02 DIAGNOSIS — M542 Cervicalgia: Secondary | ICD-10-CM | POA: Diagnosis not present

## 2019-04-02 DIAGNOSIS — Z2821 Immunization not carried out because of patient refusal: Secondary | ICD-10-CM | POA: Diagnosis not present

## 2019-04-16 DIAGNOSIS — M25511 Pain in right shoulder: Secondary | ICD-10-CM | POA: Diagnosis not present

## 2019-04-16 DIAGNOSIS — M79621 Pain in right upper arm: Secondary | ICD-10-CM | POA: Diagnosis not present

## 2019-04-16 DIAGNOSIS — M25562 Pain in left knee: Secondary | ICD-10-CM | POA: Diagnosis not present

## 2019-04-16 DIAGNOSIS — M545 Low back pain: Secondary | ICD-10-CM | POA: Diagnosis not present

## 2019-04-24 DIAGNOSIS — E039 Hypothyroidism, unspecified: Secondary | ICD-10-CM | POA: Diagnosis not present

## 2019-04-24 DIAGNOSIS — J45909 Unspecified asthma, uncomplicated: Secondary | ICD-10-CM | POA: Diagnosis not present

## 2019-04-24 DIAGNOSIS — R7301 Impaired fasting glucose: Secondary | ICD-10-CM | POA: Diagnosis not present

## 2019-04-24 DIAGNOSIS — E782 Mixed hyperlipidemia: Secondary | ICD-10-CM | POA: Diagnosis not present

## 2019-04-24 DIAGNOSIS — K219 Gastro-esophageal reflux disease without esophagitis: Secondary | ICD-10-CM | POA: Diagnosis not present

## 2019-05-02 DIAGNOSIS — E782 Mixed hyperlipidemia: Secondary | ICD-10-CM | POA: Diagnosis not present

## 2019-05-02 DIAGNOSIS — J45909 Unspecified asthma, uncomplicated: Secondary | ICD-10-CM | POA: Diagnosis not present

## 2019-05-02 DIAGNOSIS — K219 Gastro-esophageal reflux disease without esophagitis: Secondary | ICD-10-CM | POA: Diagnosis not present

## 2019-05-02 DIAGNOSIS — E039 Hypothyroidism, unspecified: Secondary | ICD-10-CM | POA: Diagnosis not present

## 2019-05-14 DIAGNOSIS — Z79891 Long term (current) use of opiate analgesic: Secondary | ICD-10-CM | POA: Diagnosis not present

## 2019-05-14 DIAGNOSIS — M79621 Pain in right upper arm: Secondary | ICD-10-CM | POA: Diagnosis not present

## 2019-05-14 DIAGNOSIS — M25562 Pain in left knee: Secondary | ICD-10-CM | POA: Diagnosis not present

## 2019-05-14 DIAGNOSIS — M25511 Pain in right shoulder: Secondary | ICD-10-CM | POA: Diagnosis not present

## 2019-05-14 DIAGNOSIS — Z2821 Immunization not carried out because of patient refusal: Secondary | ICD-10-CM | POA: Diagnosis not present

## 2019-05-14 DIAGNOSIS — M25512 Pain in left shoulder: Secondary | ICD-10-CM | POA: Diagnosis not present

## 2019-05-14 DIAGNOSIS — M545 Low back pain: Secondary | ICD-10-CM | POA: Diagnosis not present

## 2019-05-14 DIAGNOSIS — G4730 Sleep apnea, unspecified: Secondary | ICD-10-CM | POA: Diagnosis not present

## 2019-05-14 DIAGNOSIS — M79629 Pain in unspecified upper arm: Secondary | ICD-10-CM | POA: Diagnosis not present

## 2019-05-14 DIAGNOSIS — E7849 Other hyperlipidemia: Secondary | ICD-10-CM | POA: Diagnosis not present

## 2019-05-14 DIAGNOSIS — M542 Cervicalgia: Secondary | ICD-10-CM | POA: Diagnosis not present

## 2019-05-19 DIAGNOSIS — E039 Hypothyroidism, unspecified: Secondary | ICD-10-CM | POA: Diagnosis not present

## 2019-05-19 DIAGNOSIS — R490 Dysphonia: Secondary | ICD-10-CM | POA: Diagnosis not present

## 2019-05-21 DIAGNOSIS — M4802 Spinal stenosis, cervical region: Secondary | ICD-10-CM | POA: Diagnosis not present

## 2019-05-21 DIAGNOSIS — M19011 Primary osteoarthritis, right shoulder: Secondary | ICD-10-CM | POA: Diagnosis not present

## 2019-05-21 DIAGNOSIS — M1712 Unilateral primary osteoarthritis, left knee: Secondary | ICD-10-CM | POA: Diagnosis not present

## 2019-05-27 DIAGNOSIS — K219 Gastro-esophageal reflux disease without esophagitis: Secondary | ICD-10-CM | POA: Diagnosis not present

## 2019-05-27 DIAGNOSIS — E782 Mixed hyperlipidemia: Secondary | ICD-10-CM | POA: Diagnosis not present

## 2019-06-02 DIAGNOSIS — J45909 Unspecified asthma, uncomplicated: Secondary | ICD-10-CM | POA: Diagnosis not present

## 2019-06-02 DIAGNOSIS — K219 Gastro-esophageal reflux disease without esophagitis: Secondary | ICD-10-CM | POA: Diagnosis not present

## 2019-06-02 DIAGNOSIS — E039 Hypothyroidism, unspecified: Secondary | ICD-10-CM | POA: Diagnosis not present

## 2019-06-02 DIAGNOSIS — E782 Mixed hyperlipidemia: Secondary | ICD-10-CM | POA: Diagnosis not present

## 2019-06-03 DIAGNOSIS — R490 Dysphonia: Secondary | ICD-10-CM | POA: Diagnosis not present

## 2019-06-03 DIAGNOSIS — K219 Gastro-esophageal reflux disease without esophagitis: Secondary | ICD-10-CM | POA: Diagnosis not present

## 2019-06-11 DIAGNOSIS — M542 Cervicalgia: Secondary | ICD-10-CM | POA: Diagnosis not present

## 2019-06-11 DIAGNOSIS — M545 Low back pain: Secondary | ICD-10-CM | POA: Diagnosis not present

## 2019-06-11 DIAGNOSIS — M79629 Pain in unspecified upper arm: Secondary | ICD-10-CM | POA: Diagnosis not present

## 2019-06-11 DIAGNOSIS — Z79891 Long term (current) use of opiate analgesic: Secondary | ICD-10-CM | POA: Diagnosis not present

## 2019-06-11 DIAGNOSIS — M25511 Pain in right shoulder: Secondary | ICD-10-CM | POA: Diagnosis not present

## 2019-06-11 DIAGNOSIS — M25562 Pain in left knee: Secondary | ICD-10-CM | POA: Diagnosis not present

## 2019-06-13 DIAGNOSIS — G5602 Carpal tunnel syndrome, left upper limb: Secondary | ICD-10-CM | POA: Diagnosis not present

## 2019-06-16 ENCOUNTER — Other Ambulatory Visit (HOSPITAL_COMMUNITY): Payer: Self-pay | Admitting: Neurology

## 2019-06-16 DIAGNOSIS — M79642 Pain in left hand: Secondary | ICD-10-CM

## 2019-06-17 ENCOUNTER — Other Ambulatory Visit: Payer: Self-pay

## 2019-06-17 ENCOUNTER — Encounter (HOSPITAL_COMMUNITY): Payer: Self-pay

## 2019-06-17 ENCOUNTER — Ambulatory Visit (HOSPITAL_COMMUNITY)
Admission: RE | Admit: 2019-06-17 | Discharge: 2019-06-17 | Disposition: A | Discharge status: Home / Self Care | Payer: Medicare Other | Source: Ambulatory Visit | Attending: Neurology | Admitting: Neurology

## 2019-06-17 DIAGNOSIS — M79642 Pain in left hand: Secondary | ICD-10-CM | POA: Insufficient documentation

## 2019-07-03 DIAGNOSIS — K219 Gastro-esophageal reflux disease without esophagitis: Secondary | ICD-10-CM | POA: Diagnosis not present

## 2019-07-03 DIAGNOSIS — E782 Mixed hyperlipidemia: Secondary | ICD-10-CM | POA: Diagnosis not present

## 2019-07-03 DIAGNOSIS — J45909 Unspecified asthma, uncomplicated: Secondary | ICD-10-CM | POA: Diagnosis not present

## 2019-07-03 DIAGNOSIS — E039 Hypothyroidism, unspecified: Secondary | ICD-10-CM | POA: Diagnosis not present

## 2019-07-17 DIAGNOSIS — H7292 Unspecified perforation of tympanic membrane, left ear: Secondary | ICD-10-CM | POA: Diagnosis not present

## 2019-07-17 DIAGNOSIS — R490 Dysphonia: Secondary | ICD-10-CM | POA: Diagnosis not present

## 2019-07-17 DIAGNOSIS — J383 Other diseases of vocal cords: Secondary | ICD-10-CM | POA: Diagnosis not present

## 2019-07-17 DIAGNOSIS — H9193 Unspecified hearing loss, bilateral: Secondary | ICD-10-CM | POA: Diagnosis not present

## 2019-07-17 DIAGNOSIS — H6983 Other specified disorders of Eustachian tube, bilateral: Secondary | ICD-10-CM | POA: Diagnosis not present

## 2019-07-30 DIAGNOSIS — E039 Hypothyroidism, unspecified: Secondary | ICD-10-CM | POA: Diagnosis not present

## 2019-07-30 DIAGNOSIS — E782 Mixed hyperlipidemia: Secondary | ICD-10-CM | POA: Diagnosis not present

## 2019-07-30 DIAGNOSIS — K219 Gastro-esophageal reflux disease without esophagitis: Secondary | ICD-10-CM | POA: Diagnosis not present

## 2019-08-06 DIAGNOSIS — M79629 Pain in unspecified upper arm: Secondary | ICD-10-CM | POA: Diagnosis not present

## 2019-08-06 DIAGNOSIS — Z79891 Long term (current) use of opiate analgesic: Secondary | ICD-10-CM | POA: Diagnosis not present

## 2019-08-06 DIAGNOSIS — M545 Low back pain: Secondary | ICD-10-CM | POA: Diagnosis not present

## 2019-08-06 DIAGNOSIS — M542 Cervicalgia: Secondary | ICD-10-CM | POA: Diagnosis not present

## 2019-08-06 DIAGNOSIS — M25511 Pain in right shoulder: Secondary | ICD-10-CM | POA: Diagnosis not present

## 2019-08-06 DIAGNOSIS — M25562 Pain in left knee: Secondary | ICD-10-CM | POA: Diagnosis not present

## 2019-08-08 DIAGNOSIS — J381 Polyp of vocal cord and larynx: Secondary | ICD-10-CM | POA: Diagnosis not present

## 2019-08-08 DIAGNOSIS — R490 Dysphonia: Secondary | ICD-10-CM | POA: Diagnosis not present

## 2019-08-21 DIAGNOSIS — B360 Pityriasis versicolor: Secondary | ICD-10-CM | POA: Diagnosis not present

## 2019-08-21 DIAGNOSIS — E039 Hypothyroidism, unspecified: Secondary | ICD-10-CM | POA: Diagnosis not present

## 2019-08-21 DIAGNOSIS — J45909 Unspecified asthma, uncomplicated: Secondary | ICD-10-CM | POA: Diagnosis not present

## 2019-08-21 DIAGNOSIS — D1721 Benign lipomatous neoplasm of skin and subcutaneous tissue of right arm: Secondary | ICD-10-CM | POA: Diagnosis not present

## 2019-08-21 DIAGNOSIS — D509 Iron deficiency anemia, unspecified: Secondary | ICD-10-CM | POA: Diagnosis not present

## 2019-08-21 DIAGNOSIS — L604 Beau's lines: Secondary | ICD-10-CM | POA: Diagnosis not present

## 2019-08-21 DIAGNOSIS — J069 Acute upper respiratory infection, unspecified: Secondary | ICD-10-CM | POA: Diagnosis not present

## 2019-08-22 DIAGNOSIS — M25512 Pain in left shoulder: Secondary | ICD-10-CM | POA: Diagnosis not present

## 2019-08-22 DIAGNOSIS — M1712 Unilateral primary osteoarthritis, left knee: Secondary | ICD-10-CM | POA: Diagnosis not present

## 2019-08-29 DIAGNOSIS — K219 Gastro-esophageal reflux disease without esophagitis: Secondary | ICD-10-CM | POA: Diagnosis not present

## 2019-08-29 DIAGNOSIS — J45909 Unspecified asthma, uncomplicated: Secondary | ICD-10-CM | POA: Diagnosis not present

## 2019-08-29 DIAGNOSIS — E782 Mixed hyperlipidemia: Secondary | ICD-10-CM | POA: Diagnosis not present

## 2019-08-29 DIAGNOSIS — E039 Hypothyroidism, unspecified: Secondary | ICD-10-CM | POA: Diagnosis not present

## 2019-09-10 DIAGNOSIS — I10 Essential (primary) hypertension: Secondary | ICD-10-CM | POA: Diagnosis not present

## 2019-09-10 DIAGNOSIS — K219 Gastro-esophageal reflux disease without esophagitis: Secondary | ICD-10-CM | POA: Diagnosis not present

## 2019-09-10 DIAGNOSIS — H9193 Unspecified hearing loss, bilateral: Secondary | ICD-10-CM | POA: Diagnosis not present

## 2019-09-10 DIAGNOSIS — Z79899 Other long term (current) drug therapy: Secondary | ICD-10-CM | POA: Diagnosis not present

## 2019-09-10 DIAGNOSIS — H7292 Unspecified perforation of tympanic membrane, left ear: Secondary | ICD-10-CM | POA: Diagnosis not present

## 2019-09-10 DIAGNOSIS — Z886 Allergy status to analgesic agent status: Secondary | ICD-10-CM | POA: Diagnosis not present

## 2019-09-10 DIAGNOSIS — Z20822 Contact with and (suspected) exposure to covid-19: Secondary | ICD-10-CM | POA: Diagnosis not present

## 2019-09-10 DIAGNOSIS — E7800 Pure hypercholesterolemia, unspecified: Secondary | ICD-10-CM | POA: Diagnosis not present

## 2019-09-10 DIAGNOSIS — E039 Hypothyroidism, unspecified: Secondary | ICD-10-CM | POA: Diagnosis not present

## 2019-09-10 DIAGNOSIS — J45909 Unspecified asthma, uncomplicated: Secondary | ICD-10-CM | POA: Diagnosis not present

## 2019-09-10 DIAGNOSIS — H6983 Other specified disorders of Eustachian tube, bilateral: Secondary | ICD-10-CM | POA: Diagnosis not present

## 2019-09-10 DIAGNOSIS — R490 Dysphonia: Secondary | ICD-10-CM | POA: Diagnosis not present

## 2019-09-10 DIAGNOSIS — J381 Polyp of vocal cord and larynx: Secondary | ICD-10-CM | POA: Diagnosis not present

## 2019-09-11 DIAGNOSIS — M545 Low back pain: Secondary | ICD-10-CM | POA: Diagnosis not present

## 2019-09-11 DIAGNOSIS — J4541 Moderate persistent asthma with (acute) exacerbation: Secondary | ICD-10-CM | POA: Diagnosis not present

## 2019-09-11 DIAGNOSIS — J22 Unspecified acute lower respiratory infection: Secondary | ICD-10-CM | POA: Diagnosis not present

## 2019-09-14 ENCOUNTER — Emergency Department (HOSPITAL_COMMUNITY)
Admission: EM | Admit: 2019-09-14 | Discharge: 2019-09-14 | Disposition: A | Discharge status: Home / Self Care | Payer: Medicare Other | Attending: Emergency Medicine | Admitting: Emergency Medicine

## 2019-09-14 ENCOUNTER — Emergency Department (HOSPITAL_COMMUNITY): Payer: Medicare Other

## 2019-09-14 ENCOUNTER — Other Ambulatory Visit: Payer: Self-pay

## 2019-09-14 ENCOUNTER — Ambulatory Visit
Admission: EM | Admit: 2019-09-14 | Discharge: 2019-09-14 | Disposition: A | Discharge status: Home / Self Care | Payer: Medicare Other | Source: Home / Self Care

## 2019-09-14 ENCOUNTER — Encounter (HOSPITAL_COMMUNITY): Payer: Self-pay | Admitting: *Deleted

## 2019-09-14 DIAGNOSIS — J019 Acute sinusitis, unspecified: Secondary | ICD-10-CM | POA: Diagnosis not present

## 2019-09-14 DIAGNOSIS — R519 Headache, unspecified: Secondary | ICD-10-CM | POA: Diagnosis present

## 2019-09-14 DIAGNOSIS — J45909 Unspecified asthma, uncomplicated: Secondary | ICD-10-CM | POA: Diagnosis not present

## 2019-09-14 DIAGNOSIS — I517 Cardiomegaly: Secondary | ICD-10-CM | POA: Diagnosis not present

## 2019-09-14 DIAGNOSIS — J90 Pleural effusion, not elsewhere classified: Secondary | ICD-10-CM | POA: Diagnosis not present

## 2019-09-14 DIAGNOSIS — Z20822 Contact with and (suspected) exposure to covid-19: Secondary | ICD-10-CM | POA: Diagnosis not present

## 2019-09-14 DIAGNOSIS — R0602 Shortness of breath: Secondary | ICD-10-CM | POA: Diagnosis not present

## 2019-09-14 LAB — BASIC METABOLIC PANEL
Anion gap: 10 (ref 5–15)
BUN: 10 mg/dL (ref 6–20)
CO2: 29 mmol/L (ref 22–32)
Calcium: 9.7 mg/dL (ref 8.9–10.3)
Chloride: 100 mmol/L (ref 98–111)
Creatinine, Ser: 0.76 mg/dL (ref 0.44–1.00)
GFR calc Af Amer: >60 mL/min (ref >60)
GFR calc non Af Amer: >60 mL/min (ref >60)
Glucose, Bld: 101 mg/dL — ABNORMAL HIGH (ref 70–99)
Potassium: 4.2 mmol/L (ref 3.5–5.1)
Sodium: 139 mmol/L (ref 135–145)

## 2019-09-14 LAB — CBC WITH DIFFERENTIAL/PLATELET
Abs Immature Granulocytes: 0.08 10*3/uL — ABNORMAL HIGH (ref 0.00–0.07)
Basophils Absolute: 0.1 10*3/uL (ref 0.0–0.1)
Basophils Relative: 0 %
Eosinophils Absolute: 0.6 10*3/uL — ABNORMAL HIGH (ref 0.0–0.5)
Eosinophils Relative: 5 %
HCT: 33 % — ABNORMAL LOW (ref 36.0–46.0)
Hemoglobin: 10.3 g/dL — ABNORMAL LOW (ref 12.0–15.0)
Immature Granulocytes: 1 %
Lymphocytes Relative: 16 %
Lymphs Abs: 1.9 10*3/uL (ref 0.7–4.0)
MCH: 29.6 pg (ref 26.0–34.0)
MCHC: 31.2 g/dL (ref 30.0–36.0)
MCV: 94.8 fL (ref 80.0–100.0)
Monocytes Absolute: 1.3 10*3/uL — ABNORMAL HIGH (ref 0.1–1.0)
Monocytes Relative: 11 %
Neutro Abs: 7.7 10*3/uL (ref 1.7–7.7)
Neutrophils Relative %: 67 %
Platelets: 305 10*3/uL (ref 150–400)
RBC: 3.48 MIL/uL — ABNORMAL LOW (ref 3.87–5.11)
RDW: 14.3 % (ref 11.5–15.5)
WBC: 11.5 10*3/uL — ABNORMAL HIGH (ref 4.0–10.5)
nRBC: 0 % (ref 0.0–0.2)

## 2019-09-14 LAB — SARS CORONAVIRUS 2 BY RT PCR (HOSPITAL ORDER, PERFORMED IN ~~LOC~~ HOSPITAL LAB): SARS Coronavirus 2: NEGATIVE

## 2019-09-14 MED ORDER — AZITHROMYCIN 250 MG PO TABS
500.0000 mg | ORAL_TABLET | Freq: Once | ORAL | Status: AC
  Administered 2019-09-14: 500 mg via ORAL
  Filled 2019-09-14: qty 2

## 2019-09-14 MED ORDER — FLUTICASONE PROPIONATE 50 MCG/ACT NA SUSP: 1.0000 | Freq: Every day | NASAL | 0 refills | Status: DC

## 2019-09-14 MED ORDER — HYDROCOD POLST-CPM POLST ER 10-8 MG/5ML PO SUER
5.0000 mL | Freq: Once | ORAL | Status: AC
  Administered 2019-09-14: 5 mL via ORAL
  Filled 2019-09-14: qty 5

## 2019-09-14 MED ORDER — ACETAMINOPHEN 500 MG PO TABS
1000.0000 mg | ORAL_TABLET | Freq: Once | ORAL | Status: AC
  Administered 2019-09-14: 1000 mg via ORAL
  Filled 2019-09-14: qty 2

## 2019-09-14 MED ORDER — AZITHROMYCIN 250 MG PO TABS: ORAL_TABLET | ORAL | 0 refills | Status: DC

## 2019-09-14 NOTE — ED Triage Notes (Addendum)
Pt has had cough, congestion, nasal drainage and SOB x 1 week. Pt saw her PCP yesterday and given medications (Tessalon & Albuterol inhaler) but reports no relief in symptoms. Pt went to Urgent Care this morning and was sent here.

## 2019-09-14 NOTE — ED Notes (Signed)
Ambulated pt around nurse's station.  Pt's o2 sat briefly dipped to 91% but came right back up and remained around 98-99%.

## 2019-09-14 NOTE — ED Notes (Signed)
Patient is being discharged from the Urgent Care and sent to the Emergency Department via pov . Per K. Avegno, patient is in need of higher level of care due to Shortness of breath. Patient is aware and verbalizes understanding of plan of care.  Vitals:   09/14/19 0914  BP: (!) 167/108  Pulse: (!) 103  Resp: 16  Temp: 98.7 F (37.1 C)  SpO2: 98%

## 2019-09-14 NOTE — ED Provider Notes (Signed)
[facility] EMERGENCY DEPARTMENT Provider Note   CSN: [id number] Arrival date & time: 09/14/19  5697     History Chief Complaint  Patient presents with  . Shortness of Breath    Jessica Stevens is a 54 y.o. female with a history significant for asthma and has a known polyp on her tonsil anticipating surgery with an ENT specialist in [place] in 4 days presenting with a 1 week history of cough, nasal congestion with drainage of yellow, sometimes green-tinged malodorous discharge along with postnasal drip, cough which has been nonproductive and intermittent wheezing.  She was prescribed an albuterol inhaler and Tessalon by her PCP several days ago.  Her last use of albuterol was 1/2 hours ago which did relieve her wheezing.  She denies fevers or chills, chest pain, no nausea or vomiting no abdominal pain, also denies peripheral edema or extremity pain.  She has found no alleviators for her symptoms.  She also endorses a headache which she states is "behind her eyes".  Denies ear pain.  No neck pain or stiffness.  HPI     Past Medical History:  Diagnosis Date  . Ankle pain, left   . Arthritis of knee, degenerative   . Asthma   . Carpal tunnel syndrome   . Cervical radiculopathy   . Chronic back pain   . Complete rupture of rotator cuff   . Complete rupture of rotator cuff   . Depression   . Gynecomastia   . HNP (herniated nucleus pulposus), cervical   . HNP (herniated nucleus pulposus), cervical   . Hyperlipidemia   . Knee pain   . OA (osteoarthritis) of knee   . Obesity   . Urinary incontinence     Patient Active Problem List   Diagnosis Date Noted  . Other bursal cyst, right shoulder 01/16/2018  . Primary osteoarthritis of left knee 01/16/2018  . Rotator cuff arthropathy, right 01/16/2018  . Primary localized osteoarthrosis of right shoulder region 11/22/2016  . Status post arthroscopy of right shoulder 11/22/2016  . Guaiac positive stools 12/30/2013  . GERD  03/25/2010  . GUAIAC POSITIVE STOOL 03/25/2010  . NECK PAIN, CHRONIC 03/25/2010  . HEADACHE 12/30/2009  . IMPAIRED FASTING GLUCOSE 12/30/2009  . ROM 11/26/2009  . TINEA CRURIS 06/29/2009  . CANDIDIASIS 06/29/2009  . ECZEMA 03/17/2009  . CARPAL TUNNEL SYNDROME 12/15/2008  . INSOMNIA 09/13/2008  . BACK PAIN, LUMBAR, WITH RADICULOPATHY 09/09/2008  . HYPERLIPIDEMIA 08/09/2008  . FOOT PAIN, BILATERAL 08/04/2008  . KNEE, ARTHRITIS, DEGEN./OSTEO 07/29/2008  . FATIGUE 05/18/2008  . ALLERGIC RHINITIS 10/17/2007  . SHOULDER PAIN, BILATERAL 10/17/2007  . CERVICAL RADICULOPATHY 10/07/2007  . ANKLE PAIN, LEFT 08/19/2007  . OBESITY, UNSPECIFIED 06/25/2007  . DEPRESSION 06/25/2007  . ASTHMA, UNSPECIFIED, UNSPECIFIED STATUS 06/25/2007  . GYNECOMASTIA 06/25/2007  . URINARY INCONTINENCE 06/25/2007    Past Surgical History:  Procedure Laterality Date  . arthroscopy  left knee  08/14/2008   Dr. Aline Brochure   . COLONOSCOPY N/A 03/11/2014   Procedure: COLONOSCOPY;  Surgeon: Rogene Houston, MD;  Location: [facility] SUITE;  Service: Endoscopy;  Laterality: N/A;  730 - moved to 1/13 @ 12:00  . PARTIAL HYSTERECTOMY    . tube insert in right ear  2003     OB History   No obstetric history on file.     Family History  Problem Relation Age of Onset  . Diabetes Mother   . Hypertension Mother   . Cancer Father  left nephrectomy   . Hypertension Father   . Hypertension Sister   . Diabetes Maternal Grandmother   . Hypertension Maternal Grandmother   . Hypertension Maternal Grandfather     Social History   Tobacco Use  . Smoking status: Never Smoker  . Smokeless tobacco: Never Used  Vaping Use  . Vaping Use: Never used  Substance Use Topics  . Alcohol use: Yes    Comment: occasionally   . Drug use: Yes    Types: Marijuana    Comment: occasionally     Home Medications Prior to Admission medications   Medication Sig Start Date End Date Taking? Authorizing Provider  acetaminophen  (TYLENOL) 500 MG tablet Take 500 mg by mouth every 6 (six) hours as needed.    [provider]  acetaminophen-codeine (TYLENOL #3) 300-30 MG tablet  11/05/18   [provider]  albuterol (PROVENTIL HFA;VENTOLIN HFA) 108 (90 BASE) MCG/ACT inhaler Inhale into the lungs every 6 (six) hours as needed for wheezing or shortness of breath.    [provider]  azithromycin (ZITHROMAX Z-PAK) 250 MG tablet Take 1 tablet daily by mouth 09/15/19   Kailene Steinhart, Almyra Free, PA-C  Biotin 800 MCG TABS Take by mouth.    [provider]  buPROPion (WELLBUTRIN XL) 150 MG 24 hr tablet  11/05/18   [provider]  clonazePAM (KLONOPIN) 0.5 MG tablet Take by mouth.    [provider]  Crisaborole 2 % OINT Apply topically.    [provider]  cyclobenzaprine (FLEXERIL) 10 MG tablet Take 10 mg by mouth 3 (three) times daily as needed for muscle spasms.    [provider]  diazepam (VALIUM) 5 MG tablet Take by mouth. 04/10/18   [provider]  DULoxetine (CYMBALTA) 30 MG capsule Take 30 mg by mouth daily.    [provider]  DULoxetine (CYMBALTA) 60 MG capsule  11/05/18   [provider]  fluticasone (FLONASE) 50 MCG/ACT nasal spray Place 1 spray into both nostrils daily for 10 days. 09/14/19 09/24/19  Evalee Jefferson, PA-C  gabapentin (NEURONTIN) 300 MG capsule  11/05/18   [provider]  HYDROcodone-acetaminophen (NORCO/VICODIN) 5-325 MG per tablet Take 1-2 tablets by mouth every 4 (four) hours as needed. 08/20/14   Glendell Docker, NP  ibuprofen (ADVIL,MOTRIN) 800 MG tablet Take 1 tablet (800 mg total) by mouth 3 (three) times daily. 11/27/16   Evalee Jefferson, PA-C  ketoconazole (NIZORAL) 2 % cream APPLY CREAM TOPICALLY TO AFFECTED AREA TWICE DAILY 03/01/18   [provider]  levocetirizine (XYZAL) 5 MG tablet Take 5 mg by mouth every evening.    [provider]  levothyroxine (SYNTHROID) 75 MCG tablet  11/05/18   [provider]  Melatonin 10 MG CAPS Take by mouth.    [provider]  meloxicam (MOBIC) 15 MG tablet Take 15 mg by mouth daily.    [provider]  methocarbamol (ROBAXIN) 500 MG tablet Take 500 mg by mouth 4 (four) times daily.    [provider]  mometasone-formoterol (DULERA) 100-5 MCG/ACT AERO Inhale into the lungs.    [provider]  montelukast (SINGULAIR) 10 MG tablet Take 10 mg by mouth at bedtime.    [provider]  pantoprazole (PROTONIX) 40 MG tablet Take 40 mg by mouth daily.    [provider]  phentermine (ADIPEX-P) 37.5 MG tablet TAKE ONE TABLET BY MOUTH DAILY FOR WEIGHT LOSS. 09/06/18   [provider]  pravastatin (PRAVACHOL) 20 MG  tablet  11/05/18   [provider]  predniSONE (DELTASONE) 10 MG tablet TAKE 6 TABLETS ON DAY 1 BY MOUTH THEN TAKE 5 TABLETS ON DAY 2 THEN TAKE 4 TABLETS ON DAY 3 THEN TAKE 3 TABLETS ON DAY 4 THEN TAKE 2 TABLETS 10/25/18   [provider]  tiZANidine (ZANAFLEX) 2 MG tablet  11/05/18   [provider]    Allergies    Oxycodone-acetaminophen and Penicillins  Review of Systems   Review of Systems  Constitutional: Negative for chills and fever.  HENT: Positive for congestion. Negative for facial swelling and sore throat.   Eyes: Negative.   Respiratory: Positive for cough, shortness of breath and wheezing. Negative for chest tightness.   Cardiovascular: Negative for chest pain and leg swelling.  Gastrointestinal: Negative for abdominal pain, nausea and vomiting.  Genitourinary: Negative.   Musculoskeletal: Negative for arthralgias, joint swelling and neck pain.  Skin: Negative.  Negative for rash and wound.  Neurological: Positive for headaches. Negative for dizziness, weakness, light-headedness and numbness.  Psychiatric/Behavioral: Negative.     Physical Exam Updated Vital Signs BP (!) 165/89   Pulse 85   Temp 99.6 F (37.6 C) (Oral)   Resp 16   SpO2  93%   Physical Exam Constitutional:      Appearance: She is well-developed.  HENT:     Head: Normocephalic and atraumatic.     Right Ear: Tympanic membrane and ear canal normal.     Left Ear: Tympanic membrane and ear canal normal.     Nose: Mucosal edema and rhinorrhea present.     Right Turbinates: Swollen.     Left Turbinates: Swollen.     Right Sinus: Frontal sinus tenderness present.     Left Sinus: Frontal sinus tenderness present.     Mouth/Throat:     Pharynx: Uvula midline. No oropharyngeal exudate or posterior oropharyngeal erythema.     Tonsils: No tonsillar abscesses.  Eyes:     Conjunctiva/sclera: Conjunctivae normal.  Cardiovascular:     Rate and Rhythm: Normal rate.     Heart sounds: Normal heart sounds.  Pulmonary:     Effort: Pulmonary effort is normal. No respiratory distress.     Breath sounds: No wheezing, rhonchi or rales.  Abdominal:     Palpations: Abdomen is soft.     Tenderness: There is no abdominal tenderness.  Musculoskeletal:        General: Normal range of motion.  Skin:    General: Skin is warm and dry.     Findings: No rash.  Neurological:     Mental Status: She is alert and oriented to person, place, and time.     ED Results / Procedures / Treatments   Labs (all labs ordered are listed, but only abnormal results are displayed) Labs Reviewed  CBC WITH DIFFERENTIAL/PLATELET - Abnormal; Notable for the following components:      Result Value   WBC 11.5 (*)    RBC 3.48 (*)    Hemoglobin 10.3 (*)    HCT 33.0 (*)    Monocytes Absolute 1.3 (*)    Eosinophils Absolute 0.6 (*)    Abs Immature Granulocytes 0.08 (*)    All other components within normal limits  BASIC METABOLIC PANEL - Abnormal; Notable for the following components:   Glucose, Bld 101 (*)    All other components within normal limits  SARS CORONAVIRUS 2 BY RT PCR (HOSPITAL ORDER, [facility])    EKG EKG  Interpretation  Date/Time:  "Sunday September 14 2019 09:43:33 EDT Ventricular Rate:  91 PR Interval:    QRS Duration: 89 QT Interval:  339 QTC Calculation: 417 R Axis:   44 Text Interpretation: Sinus rhythm No STEMI Confirmed by Trifan, Matthew ([id number]) on 09/14/2019 10:10:18 AM   Radiology DG Chest Port 1 View  Result Date: 09/14/2019 CLINICAL DATA:  Cough, congestion, nasal drainage and shortness of breath for 1 week. EXAM: PORTABLE CHEST 1 VIEW COMPARISON:  PA and lateral chest 12/26/2012. FINDINGS: Lungs clear. Heart size is enlarged. No pneumothorax or pleural effusion. Severe degenerative change is present about both shoulders. IMPRESSION: No acute disease. Cardiomegaly. Severe bilateral glenohumeral osteoarthritis. Electronically Signed   By: Thomas  Dalessio M.D.   On: 09/14/2019 12:17    Procedures Procedures (including critical care time)  Medications Ordered in ED Medications  azithromycin (ZITHROMAX) tablet 500 mg (has no administration in time range)  acetaminophen (TYLENOL) tablet 1,000 mg (has no administration in time range)  chlorpheniramine-HYDROcodone (TUSSIONEX) 10-8 MG/5ML suspension 5 mL (5 mLs Oral Given 09/14/19 1049)    ED Course  I have reviewed the triage vital signs and the nursing notes.  Pertinent labs & imaging results that were available during my care of the patient were reviewed by me and considered in my medical decision making (see chart for details).    MDM Rules/Calculators/A&P                          Patient history and exam consistent with acute sinusitis.  She has no wheezing or shortness of breath, no hypoxia here.  She is on an albuterol inhaler and she was encouraged to continue using this as needed.  Her exam suggests an acute sinusitis, localizing pain suggesting probable ethmoid.  She was placed on Zithromax as she is penicillin allergic.  First dose given here.  Also prescribed Flonase.  She was encouraged follow-up with her PCP for any persistent or worsening symptoms.  She is  also scheduled for a outpatient surgery on a tonsil polyp per patient's report (not obvious on today's exam) in 4 days.  She was advised to contact the surgeon to to advise an event procedure needs rescheduling. Final Clinical Impression(s) / ED Diagnoses Final diagnoses:  Acute non-recurrent sinusitis, unspecified location    Rx / DC Orders ED Discharge Orders         Ordered    azithromycin (ZITHROMAX Z-PAK) 250 MG tablet     Discontinue  Reprint     09/14/19 1316    fluticasone (FLONASE) 50 MCG/ACT nasal spray  Daily     Discontinue  Reprint     07" /18/21 1316           Evalee Jefferson, Hershal Coria 09/14/19 1321    Wyvonnia Dusky, MD 09/14/19 [phone]

## 2019-09-14 NOTE — Discharge Instructions (Addendum)
Your history and exam suggest you have a sinus infection.  You have been given today's dose of antibiotic, take your next dose of Zithromax tomorrow evening.  Use the nasal spray to help with nasal congestion.  You will also need to contact your surgical specialist tomorrow in [place] to let this person know of your current symptoms.  They may or may not want to reschedule your surgical procedure this week.

## 2019-09-14 NOTE — ED Triage Notes (Signed)
Pt short of breath and chest congestion for a few days.  Was seen by her regular pcp and given medications with no help.

## 2019-09-18 DIAGNOSIS — Z20822 Contact with and (suspected) exposure to covid-19: Secondary | ICD-10-CM | POA: Diagnosis not present

## 2019-09-18 DIAGNOSIS — K219 Gastro-esophageal reflux disease without esophagitis: Secondary | ICD-10-CM | POA: Diagnosis not present

## 2019-09-18 DIAGNOSIS — J45909 Unspecified asthma, uncomplicated: Secondary | ICD-10-CM | POA: Diagnosis not present

## 2019-09-18 DIAGNOSIS — H9193 Unspecified hearing loss, bilateral: Secondary | ICD-10-CM | POA: Diagnosis not present

## 2019-09-18 DIAGNOSIS — R490 Dysphonia: Secondary | ICD-10-CM | POA: Diagnosis not present

## 2019-09-18 DIAGNOSIS — J383 Other diseases of vocal cords: Secondary | ICD-10-CM | POA: Diagnosis not present

## 2019-09-18 DIAGNOSIS — I10 Essential (primary) hypertension: Secondary | ICD-10-CM | POA: Diagnosis not present

## 2019-09-18 DIAGNOSIS — Z886 Allergy status to analgesic agent status: Secondary | ICD-10-CM | POA: Diagnosis not present

## 2019-09-18 DIAGNOSIS — H6983 Other specified disorders of Eustachian tube, bilateral: Secondary | ICD-10-CM | POA: Diagnosis not present

## 2019-09-18 DIAGNOSIS — Z79899 Other long term (current) drug therapy: Secondary | ICD-10-CM | POA: Diagnosis not present

## 2019-09-18 DIAGNOSIS — E7800 Pure hypercholesterolemia, unspecified: Secondary | ICD-10-CM | POA: Diagnosis not present

## 2019-09-18 DIAGNOSIS — H7292 Unspecified perforation of tympanic membrane, left ear: Secondary | ICD-10-CM | POA: Diagnosis not present

## 2019-09-18 DIAGNOSIS — E039 Hypothyroidism, unspecified: Secondary | ICD-10-CM | POA: Diagnosis not present

## 2019-09-18 DIAGNOSIS — J381 Polyp of vocal cord and larynx: Secondary | ICD-10-CM | POA: Diagnosis not present

## 2019-09-23 DIAGNOSIS — Z2821 Immunization not carried out because of patient refusal: Secondary | ICD-10-CM | POA: Diagnosis not present

## 2019-09-23 DIAGNOSIS — G4730 Sleep apnea, unspecified: Secondary | ICD-10-CM | POA: Diagnosis not present

## 2019-09-23 DIAGNOSIS — M25512 Pain in left shoulder: Secondary | ICD-10-CM | POA: Diagnosis not present

## 2019-09-23 DIAGNOSIS — M542 Cervicalgia: Secondary | ICD-10-CM | POA: Diagnosis not present

## 2019-09-23 DIAGNOSIS — E7849 Other hyperlipidemia: Secondary | ICD-10-CM | POA: Diagnosis not present

## 2019-09-23 DIAGNOSIS — R7301 Impaired fasting glucose: Secondary | ICD-10-CM | POA: Diagnosis not present

## 2019-09-24 DIAGNOSIS — D649 Anemia, unspecified: Secondary | ICD-10-CM | POA: Diagnosis not present

## 2019-09-24 DIAGNOSIS — M545 Low back pain: Secondary | ICD-10-CM | POA: Diagnosis not present

## 2019-09-24 DIAGNOSIS — J309 Allergic rhinitis, unspecified: Secondary | ICD-10-CM | POA: Diagnosis not present

## 2019-10-01 DIAGNOSIS — R0982 Postnasal drip: Secondary | ICD-10-CM | POA: Diagnosis not present

## 2019-10-01 DIAGNOSIS — J381 Polyp of vocal cord and larynx: Secondary | ICD-10-CM | POA: Diagnosis not present

## 2019-10-01 DIAGNOSIS — R490 Dysphonia: Secondary | ICD-10-CM | POA: Diagnosis not present

## 2019-10-01 DIAGNOSIS — J309 Allergic rhinitis, unspecified: Secondary | ICD-10-CM | POA: Diagnosis not present

## 2019-10-16 DIAGNOSIS — G4730 Sleep apnea, unspecified: Secondary | ICD-10-CM | POA: Diagnosis not present

## 2019-10-16 DIAGNOSIS — Z2821 Immunization not carried out because of patient refusal: Secondary | ICD-10-CM | POA: Diagnosis not present

## 2019-10-16 DIAGNOSIS — M542 Cervicalgia: Secondary | ICD-10-CM | POA: Diagnosis not present

## 2019-10-16 DIAGNOSIS — D473 Essential (hemorrhagic) thrombocythemia: Secondary | ICD-10-CM | POA: Diagnosis not present

## 2019-10-16 DIAGNOSIS — E7849 Other hyperlipidemia: Secondary | ICD-10-CM | POA: Diagnosis not present

## 2019-10-16 DIAGNOSIS — R600 Localized edema: Secondary | ICD-10-CM | POA: Diagnosis not present

## 2019-10-20 DIAGNOSIS — E039 Hypothyroidism, unspecified: Secondary | ICD-10-CM | POA: Diagnosis not present

## 2019-10-20 DIAGNOSIS — E782 Mixed hyperlipidemia: Secondary | ICD-10-CM | POA: Diagnosis not present

## 2019-10-20 DIAGNOSIS — K219 Gastro-esophageal reflux disease without esophagitis: Secondary | ICD-10-CM | POA: Diagnosis not present

## 2019-10-20 DIAGNOSIS — J45909 Unspecified asthma, uncomplicated: Secondary | ICD-10-CM | POA: Diagnosis not present

## 2019-10-20 DIAGNOSIS — R7301 Impaired fasting glucose: Secondary | ICD-10-CM | POA: Diagnosis not present

## 2019-10-23 DIAGNOSIS — E7849 Other hyperlipidemia: Secondary | ICD-10-CM | POA: Diagnosis not present

## 2019-10-23 DIAGNOSIS — M542 Cervicalgia: Secondary | ICD-10-CM | POA: Diagnosis not present

## 2019-10-23 DIAGNOSIS — E782 Mixed hyperlipidemia: Secondary | ICD-10-CM | POA: Diagnosis not present

## 2019-10-23 DIAGNOSIS — D649 Anemia, unspecified: Secondary | ICD-10-CM | POA: Diagnosis not present

## 2019-10-23 DIAGNOSIS — G4730 Sleep apnea, unspecified: Secondary | ICD-10-CM | POA: Diagnosis not present

## 2019-10-23 DIAGNOSIS — M545 Low back pain: Secondary | ICD-10-CM | POA: Diagnosis not present

## 2019-10-23 DIAGNOSIS — D473 Essential (hemorrhagic) thrombocythemia: Secondary | ICD-10-CM | POA: Diagnosis not present

## 2019-10-23 DIAGNOSIS — Z2821 Immunization not carried out because of patient refusal: Secondary | ICD-10-CM | POA: Diagnosis not present

## 2019-11-12 DIAGNOSIS — H6983 Other specified disorders of Eustachian tube, bilateral: Secondary | ICD-10-CM | POA: Diagnosis not present

## 2019-11-12 DIAGNOSIS — R490 Dysphonia: Secondary | ICD-10-CM | POA: Diagnosis not present

## 2019-11-12 DIAGNOSIS — J381 Polyp of vocal cord and larynx: Secondary | ICD-10-CM | POA: Diagnosis not present

## 2019-11-12 DIAGNOSIS — R0982 Postnasal drip: Secondary | ICD-10-CM | POA: Diagnosis not present

## 2019-11-12 DIAGNOSIS — J309 Allergic rhinitis, unspecified: Secondary | ICD-10-CM | POA: Diagnosis not present

## 2019-11-25 DIAGNOSIS — E039 Hypothyroidism, unspecified: Secondary | ICD-10-CM | POA: Diagnosis not present

## 2019-11-25 DIAGNOSIS — E782 Mixed hyperlipidemia: Secondary | ICD-10-CM | POA: Diagnosis not present

## 2019-11-25 DIAGNOSIS — R7301 Impaired fasting glucose: Secondary | ICD-10-CM | POA: Diagnosis not present

## 2019-11-25 DIAGNOSIS — K219 Gastro-esophageal reflux disease without esophagitis: Secondary | ICD-10-CM | POA: Diagnosis not present

## 2019-12-04 DIAGNOSIS — M542 Cervicalgia: Secondary | ICD-10-CM | POA: Diagnosis not present

## 2019-12-05 DIAGNOSIS — J381 Polyp of vocal cord and larynx: Secondary | ICD-10-CM | POA: Diagnosis not present

## 2019-12-05 DIAGNOSIS — J309 Allergic rhinitis, unspecified: Secondary | ICD-10-CM | POA: Diagnosis not present

## 2019-12-05 DIAGNOSIS — R0982 Postnasal drip: Secondary | ICD-10-CM | POA: Diagnosis not present

## 2019-12-05 DIAGNOSIS — R490 Dysphonia: Secondary | ICD-10-CM | POA: Diagnosis not present

## 2019-12-05 DIAGNOSIS — H6983 Other specified disorders of Eustachian tube, bilateral: Secondary | ICD-10-CM | POA: Diagnosis not present

## 2019-12-10 DIAGNOSIS — M25512 Pain in left shoulder: Secondary | ICD-10-CM | POA: Diagnosis not present

## 2019-12-10 DIAGNOSIS — M4722 Other spondylosis with radiculopathy, cervical region: Secondary | ICD-10-CM | POA: Diagnosis not present

## 2019-12-10 DIAGNOSIS — M25511 Pain in right shoulder: Secondary | ICD-10-CM | POA: Diagnosis not present

## 2019-12-10 DIAGNOSIS — M542 Cervicalgia: Secondary | ICD-10-CM | POA: Diagnosis not present

## 2019-12-17 DIAGNOSIS — M1712 Unilateral primary osteoarthritis, left knee: Secondary | ICD-10-CM | POA: Diagnosis not present

## 2019-12-17 DIAGNOSIS — M542 Cervicalgia: Secondary | ICD-10-CM | POA: Diagnosis not present

## 2019-12-18 DIAGNOSIS — E7849 Other hyperlipidemia: Secondary | ICD-10-CM | POA: Diagnosis not present

## 2019-12-18 DIAGNOSIS — M1712 Unilateral primary osteoarthritis, left knee: Secondary | ICD-10-CM | POA: Diagnosis not present

## 2019-12-18 DIAGNOSIS — M1990 Unspecified osteoarthritis, unspecified site: Secondary | ICD-10-CM | POA: Diagnosis not present

## 2019-12-18 DIAGNOSIS — I10 Essential (primary) hypertension: Secondary | ICD-10-CM | POA: Diagnosis not present

## 2020-01-06 DIAGNOSIS — M19011 Primary osteoarthritis, right shoulder: Secondary | ICD-10-CM | POA: Diagnosis not present

## 2020-01-06 DIAGNOSIS — M19012 Primary osteoarthritis, left shoulder: Secondary | ICD-10-CM | POA: Diagnosis not present

## 2020-01-06 DIAGNOSIS — M1712 Unilateral primary osteoarthritis, left knee: Secondary | ICD-10-CM | POA: Diagnosis not present

## 2020-01-07 DIAGNOSIS — Q7649 Other congenital malformations of spine, not associated with scoliosis: Secondary | ICD-10-CM | POA: Diagnosis not present

## 2020-01-07 DIAGNOSIS — M50321 Other cervical disc degeneration at C4-C5 level: Secondary | ICD-10-CM | POA: Diagnosis not present

## 2020-01-07 DIAGNOSIS — M452 Ankylosing spondylitis of cervical region: Secondary | ICD-10-CM | POA: Diagnosis not present

## 2020-01-07 DIAGNOSIS — M4802 Spinal stenosis, cervical region: Secondary | ICD-10-CM | POA: Diagnosis not present

## 2020-01-08 DIAGNOSIS — M5412 Radiculopathy, cervical region: Secondary | ICD-10-CM | POA: Diagnosis not present

## 2020-01-08 DIAGNOSIS — Q761 Klippel-Feil syndrome: Secondary | ICD-10-CM | POA: Diagnosis not present

## 2020-01-08 DIAGNOSIS — M4722 Other spondylosis with radiculopathy, cervical region: Secondary | ICD-10-CM | POA: Diagnosis not present

## 2020-01-19 DIAGNOSIS — M1990 Unspecified osteoarthritis, unspecified site: Secondary | ICD-10-CM | POA: Diagnosis not present

## 2020-01-19 DIAGNOSIS — I10 Essential (primary) hypertension: Secondary | ICD-10-CM | POA: Diagnosis not present

## 2020-01-19 DIAGNOSIS — E7849 Other hyperlipidemia: Secondary | ICD-10-CM | POA: Diagnosis not present

## 2020-01-19 DIAGNOSIS — M1712 Unilateral primary osteoarthritis, left knee: Secondary | ICD-10-CM | POA: Diagnosis not present

## 2020-01-20 DIAGNOSIS — M1712 Unilateral primary osteoarthritis, left knee: Secondary | ICD-10-CM | POA: Diagnosis not present

## 2020-01-28 DIAGNOSIS — M19012 Primary osteoarthritis, left shoulder: Secondary | ICD-10-CM | POA: Diagnosis not present

## 2020-01-28 DIAGNOSIS — M19011 Primary osteoarthritis, right shoulder: Secondary | ICD-10-CM | POA: Diagnosis not present

## 2020-01-28 DIAGNOSIS — M25562 Pain in left knee: Secondary | ICD-10-CM | POA: Diagnosis not present

## 2020-01-29 ENCOUNTER — Other Ambulatory Visit: Payer: Self-pay | Admitting: Orthopedic Surgery

## 2020-01-29 DIAGNOSIS — M25512 Pain in left shoulder: Secondary | ICD-10-CM

## 2020-01-29 DIAGNOSIS — M25511 Pain in right shoulder: Secondary | ICD-10-CM

## 2020-01-30 DIAGNOSIS — G4730 Sleep apnea, unspecified: Secondary | ICD-10-CM | POA: Diagnosis not present

## 2020-01-30 DIAGNOSIS — M542 Cervicalgia: Secondary | ICD-10-CM | POA: Diagnosis not present

## 2020-01-30 DIAGNOSIS — E7849 Other hyperlipidemia: Secondary | ICD-10-CM | POA: Diagnosis not present

## 2020-01-30 DIAGNOSIS — R7301 Impaired fasting glucose: Secondary | ICD-10-CM | POA: Diagnosis not present

## 2020-01-30 DIAGNOSIS — D473 Essential (hemorrhagic) thrombocythemia: Secondary | ICD-10-CM | POA: Diagnosis not present

## 2020-01-30 DIAGNOSIS — Z2821 Immunization not carried out because of patient refusal: Secondary | ICD-10-CM | POA: Diagnosis not present

## 2020-02-04 DIAGNOSIS — M5412 Radiculopathy, cervical region: Secondary | ICD-10-CM | POA: Diagnosis not present

## 2020-02-05 DIAGNOSIS — Z1231 Encounter for screening mammogram for malignant neoplasm of breast: Secondary | ICD-10-CM | POA: Diagnosis not present

## 2020-02-10 DIAGNOSIS — Z0001 Encounter for general adult medical examination with abnormal findings: Secondary | ICD-10-CM | POA: Diagnosis not present

## 2020-02-16 ENCOUNTER — Other Ambulatory Visit: Payer: Medicare Other

## 2020-02-18 DIAGNOSIS — M5412 Radiculopathy, cervical region: Secondary | ICD-10-CM | POA: Diagnosis not present

## 2020-02-18 DIAGNOSIS — M4722 Other spondylosis with radiculopathy, cervical region: Secondary | ICD-10-CM | POA: Diagnosis not present

## 2020-02-27 DIAGNOSIS — K219 Gastro-esophageal reflux disease without esophagitis: Secondary | ICD-10-CM | POA: Diagnosis not present

## 2020-02-27 DIAGNOSIS — J45909 Unspecified asthma, uncomplicated: Secondary | ICD-10-CM | POA: Diagnosis not present

## 2020-02-27 DIAGNOSIS — E039 Hypothyroidism, unspecified: Secondary | ICD-10-CM | POA: Diagnosis not present

## 2020-02-27 DIAGNOSIS — E782 Mixed hyperlipidemia: Secondary | ICD-10-CM | POA: Diagnosis not present

## 2020-03-10 DIAGNOSIS — M542 Cervicalgia: Secondary | ICD-10-CM | POA: Diagnosis not present

## 2020-03-10 DIAGNOSIS — M5412 Radiculopathy, cervical region: Secondary | ICD-10-CM | POA: Diagnosis not present

## 2020-03-10 DIAGNOSIS — M4722 Other spondylosis with radiculopathy, cervical region: Secondary | ICD-10-CM | POA: Diagnosis not present

## 2020-03-10 DIAGNOSIS — M50321 Other cervical disc degeneration at C4-C5 level: Secondary | ICD-10-CM | POA: Diagnosis not present

## 2020-03-12 ENCOUNTER — Other Ambulatory Visit (HOSPITAL_COMMUNITY): Payer: Self-pay | Admitting: Orthopaedic Surgery

## 2020-03-12 DIAGNOSIS — M5412 Radiculopathy, cervical region: Secondary | ICD-10-CM

## 2020-03-13 ENCOUNTER — Other Ambulatory Visit: Payer: Self-pay

## 2020-03-13 ENCOUNTER — Ambulatory Visit
Admission: RE | Admit: 2020-03-13 | Discharge: 2020-03-13 | Disposition: A | Discharge status: Home / Self Care | Payer: Medicare Other | Source: Ambulatory Visit | Attending: Orthopedic Surgery | Admitting: Orthopedic Surgery

## 2020-03-13 DIAGNOSIS — M25511 Pain in right shoulder: Secondary | ICD-10-CM | POA: Diagnosis not present

## 2020-03-13 DIAGNOSIS — M19012 Primary osteoarthritis, left shoulder: Secondary | ICD-10-CM | POA: Diagnosis not present

## 2020-03-13 DIAGNOSIS — M25512 Pain in left shoulder: Secondary | ICD-10-CM

## 2020-03-17 DIAGNOSIS — M5412 Radiculopathy, cervical region: Secondary | ICD-10-CM | POA: Diagnosis not present

## 2020-03-18 ENCOUNTER — Ambulatory Visit (HOSPITAL_COMMUNITY): Payer: Medicare Other

## 2020-03-18 ENCOUNTER — Encounter (HOSPITAL_COMMUNITY): Payer: Self-pay

## 2020-03-22 DIAGNOSIS — S46011A Strain of muscle(s) and tendon(s) of the rotator cuff of right shoulder, initial encounter: Secondary | ICD-10-CM | POA: Diagnosis not present

## 2020-03-22 DIAGNOSIS — M19011 Primary osteoarthritis, right shoulder: Secondary | ICD-10-CM | POA: Diagnosis not present

## 2020-03-22 DIAGNOSIS — M75121 Complete rotator cuff tear or rupture of right shoulder, not specified as traumatic: Secondary | ICD-10-CM | POA: Diagnosis not present

## 2020-03-26 ENCOUNTER — Other Ambulatory Visit: Payer: Self-pay | Admitting: Orthopedic Surgery

## 2020-03-26 DIAGNOSIS — Z01811 Encounter for preprocedural respiratory examination: Secondary | ICD-10-CM

## 2020-03-27 DIAGNOSIS — E039 Hypothyroidism, unspecified: Secondary | ICD-10-CM | POA: Diagnosis not present

## 2020-03-27 DIAGNOSIS — J45909 Unspecified asthma, uncomplicated: Secondary | ICD-10-CM | POA: Diagnosis not present

## 2020-03-27 DIAGNOSIS — K219 Gastro-esophageal reflux disease without esophagitis: Secondary | ICD-10-CM | POA: Diagnosis not present

## 2020-03-27 DIAGNOSIS — R7301 Impaired fasting glucose: Secondary | ICD-10-CM | POA: Diagnosis not present

## 2020-03-27 DIAGNOSIS — E782 Mixed hyperlipidemia: Secondary | ICD-10-CM | POA: Diagnosis not present

## 2020-04-01 DIAGNOSIS — H905 Unspecified sensorineural hearing loss: Secondary | ICD-10-CM | POA: Diagnosis not present

## 2020-04-07 ENCOUNTER — Encounter (HOSPITAL_COMMUNITY): Payer: Medicare Other

## 2020-04-07 DIAGNOSIS — M5412 Radiculopathy, cervical region: Secondary | ICD-10-CM | POA: Diagnosis not present

## 2020-04-07 DIAGNOSIS — M4722 Other spondylosis with radiculopathy, cervical region: Secondary | ICD-10-CM | POA: Diagnosis not present

## 2020-04-08 DIAGNOSIS — M170 Bilateral primary osteoarthritis of knee: Secondary | ICD-10-CM | POA: Diagnosis not present

## 2020-04-08 DIAGNOSIS — M542 Cervicalgia: Secondary | ICD-10-CM | POA: Diagnosis not present

## 2020-04-08 DIAGNOSIS — M545 Low back pain, unspecified: Secondary | ICD-10-CM | POA: Diagnosis not present

## 2020-04-15 ENCOUNTER — Ambulatory Visit: Admit: 2020-04-15 | Payer: Medicare Other | Admitting: Orthopedic Surgery

## 2020-04-15 SURGERY — ARTHROPLASTY, SHOULDER, TOTAL, REVERSE
Anesthesia: Choice | Site: Shoulder | Laterality: Right

## 2020-04-26 DIAGNOSIS — M542 Cervicalgia: Secondary | ICD-10-CM | POA: Diagnosis not present

## 2020-04-26 DIAGNOSIS — K219 Gastro-esophageal reflux disease without esophagitis: Secondary | ICD-10-CM | POA: Diagnosis not present

## 2020-04-26 DIAGNOSIS — I10 Essential (primary) hypertension: Secondary | ICD-10-CM | POA: Diagnosis not present

## 2020-05-05 ENCOUNTER — Ambulatory Visit (HOSPITAL_COMMUNITY): Payer: Medicare Other

## 2020-05-10 DIAGNOSIS — E7849 Other hyperlipidemia: Secondary | ICD-10-CM | POA: Diagnosis not present

## 2020-05-10 DIAGNOSIS — R7301 Impaired fasting glucose: Secondary | ICD-10-CM | POA: Diagnosis not present

## 2020-05-10 DIAGNOSIS — E039 Hypothyroidism, unspecified: Secondary | ICD-10-CM | POA: Diagnosis not present

## 2020-05-10 DIAGNOSIS — D473 Essential (hemorrhagic) thrombocythemia: Secondary | ICD-10-CM | POA: Diagnosis not present

## 2020-05-12 ENCOUNTER — Other Ambulatory Visit (HOSPITAL_COMMUNITY): Payer: Self-pay | Admitting: Internal Medicine

## 2020-05-12 DIAGNOSIS — M545 Low back pain, unspecified: Secondary | ICD-10-CM | POA: Diagnosis not present

## 2020-05-12 DIAGNOSIS — D649 Anemia, unspecified: Secondary | ICD-10-CM | POA: Diagnosis not present

## 2020-05-12 DIAGNOSIS — E782 Mixed hyperlipidemia: Secondary | ICD-10-CM | POA: Diagnosis not present

## 2020-05-12 DIAGNOSIS — N183 Chronic kidney disease, stage 3 unspecified: Secondary | ICD-10-CM | POA: Diagnosis not present

## 2020-05-12 DIAGNOSIS — M170 Bilateral primary osteoarthritis of knee: Secondary | ICD-10-CM | POA: Diagnosis not present

## 2020-05-12 DIAGNOSIS — Z13820 Encounter for screening for osteoporosis: Secondary | ICD-10-CM

## 2020-05-12 DIAGNOSIS — D473 Essential (hemorrhagic) thrombocythemia: Secondary | ICD-10-CM | POA: Diagnosis not present

## 2020-05-12 DIAGNOSIS — K219 Gastro-esophageal reflux disease without esophagitis: Secondary | ICD-10-CM | POA: Diagnosis not present

## 2020-05-12 DIAGNOSIS — I10 Essential (primary) hypertension: Secondary | ICD-10-CM | POA: Diagnosis not present

## 2020-05-12 DIAGNOSIS — J309 Allergic rhinitis, unspecified: Secondary | ICD-10-CM | POA: Diagnosis not present

## 2020-05-14 ENCOUNTER — Other Ambulatory Visit: Payer: Self-pay | Admitting: Orthopedic Surgery

## 2020-05-18 ENCOUNTER — Other Ambulatory Visit (HOSPITAL_COMMUNITY): Payer: Medicare Other

## 2020-05-19 NOTE — Progress Notes (Signed)
DUE TO COVID-19 ONLY ONE VISITOR IS ALLOWED TO COME WITH YOU AND STAY IN THE WAITING ROOM ONLY DURING PRE OP AND PROCEDURE DAY OF SURGERY. THE 1 VISITOR  MAY VISIT WITH YOU AFTER SURGERY IN YOUR PRIVATE ROOM DURING VISITING HOURS ONLY!  YOU NEED TO HAVE A COVID 19 TEST ON__3/28/22_____ @_______ , THIS TEST MUST BE DONE BEFORE SURGERY,  COVID TESTING SITE [address] [place] [address], IT IS ON THE RIGHT GOING OUT [address] APPROXIMATELY  2 MINUTES PAST [employer] ON THE RIGHT. ONCE YOUR COVID TEST IS COMPLETED,  PLEASE BEGIN THE QUARANTINE INSTRUCTIONS AS OUTLINED IN YOUR HANDOUT.                Jessica Stevens  05/19/2020   Your procedure is scheduled on:         05/27/20  Report to [facility] Main  Entrance   Report to admitting at    0530am      Call this number if you have problems the morning of surgery [phone]    REMEMBER: NO  SOLID FOOD CANDY OR GUM AFTER MIDNIGHT. CLEAR LIQUIDS UNTIL   0430am       . NOTHING BY MOUTH EXCEPT CLEAR LIQUIDS UNTIL    0430am    . PLEASE FINISH ENSURE DRINK PER SURGEON ORDER  WHICH NEEDS TO BE COMPLETED AT   0430am    .      CLEAR LIQUID DIET   Foods Allowed                                                                    Coffee and tea, regular and decaf                            Fruit ices (not with fruit pulp)                                      Iced Popsicles                                    Carbonated beverages, regular and diet                                    Cranberry, grape and apple juices Sports drinks like Gatorade Lightly seasoned clear broth or consume(fat free) Sugar, honey syrup ___________________________________________________________________      BRUSH YOUR TEETH MORNING OF SURGERY AND RINSE YOUR MOUTH OUT, NO CHEWING GUM CANDY OR MINTS.     Take these medicines the morning of surgery with A SIP OF WATER: protonix, synthroid, gabapentin, cymbalta, buspar, wellbutrin,  inhalers as usual and bring, amlodipine  DO NOT TAKE ANY DIABETIC MEDICATIONS DAY OF YOUR SURGERY                               You may not have any metal on your body including hair  pins and              piercings  Do not wear jewelry, make-up, lotions, powders or perfumes, deodorant             Do not wear nail polish on your fingernails.  Do not shave  48 hours prior to surgery.              Men may shave face and neck.   Do not bring valuables to the hospital. [place].  Contacts, dentures or bridgework may not be worn into surgery.  Leave suitcase in the car. After surgery it may be brought to your room.     Patients discharged the day of surgery will not be allowed to drive home. IF YOU ARE HAVING SURGERY AND GOING HOME THE SAME DAY, YOU MUST HAVE AN ADULT TO DRIVE YOU HOME AND BE WITH YOU FOR 24 HOURS. YOU MAY GO HOME BY TAXI OR UBER OR ORTHERWISE, BUT AN ADULT MUST ACCOMPANY YOU HOME AND STAY WITH YOU FOR 24 HOURS.  Name and phone number of your driver:  Special Instructions: N/A              Please read over the following fact sheets you were given: _____________________________________________________________________  [facility] - Preparing for Surgery Before surgery, you can play an important role.  Because skin is not sterile, your skin needs to be as free of germs as possible.  You can reduce the number of germs on your skin by washing with CHG (chlorahexidine gluconate) soap before surgery.  CHG is an antiseptic cleaner which kills germs and bonds with the skin to continue killing germs even after washing. Please DO NOT use if you have an allergy to CHG or antibacterial soaps.  If your skin becomes reddened/irritated stop using the CHG and inform your nurse when you arrive at Short Stay. Do not shave (including legs and underarms) for at least 48 hours prior to the first CHG shower.  You may shave your face/neck. Please follow  these instructions carefully:  1.  Shower with CHG Soap the night before surgery and the  morning of Surgery.  2.  If you choose to wash your hair, wash your hair first as usual with your  normal  shampoo.  3.  After you shampoo, rinse your hair and body thoroughly to remove the  shampoo.                           4.  Use CHG as you would any other liquid soap.  You can apply chg directly  to the skin and wash                       Gently with a scrungie or clean washcloth.  5.  Apply the CHG Soap to your body ONLY FROM THE NECK DOWN.   Do not use on face/ open                           Wound or open sores. Avoid contact with eyes, ears mouth and genitals (private parts).                       Wash face,  Genitals (private parts) with your  normal soap.             6.  Wash thoroughly, paying special attention to the area where your surgery  will be performed.  7.  Thoroughly rinse your body with warm water from the neck down.  8.  DO NOT shower/wash with your normal soap after using and rinsing off  the CHG Soap.                9.  Pat yourself dry with a clean towel.            10.  Wear clean pajamas.            11.  Place clean sheets on your bed the night of your first shower and do not  sleep with pets. Day of Surgery : Do not apply any lotions/deodorants the morning of surgery.  Please wear clean clothes to the hospital/surgery center.  FAILURE TO FOLLOW THESE INSTRUCTIONS MAY RESULT IN THE CANCELLATION OF YOUR SURGERY PATIENT SIGNATURE_________________________________  NURSE SIGNATURE__________________________________  ________________________________________________________________________

## 2020-05-19 NOTE — Progress Notes (Signed)
[place]- Preparing for Total Shoulder Arthroplasty    Before surgery, you can play an important role. Because skin is not sterile, your skin needs to be as free of germs as possible. You can reduce the number of germs on your skin by using the following products. . Benzoyl Peroxide Gel o Reduces the number of germs present on the skin o Applied twice a day to shoulder area starting two days before surgery    ==================================================================  Please follow these instructions carefully:  BENZOYL PEROXIDE 5% GEL  Please do not use if you have an allergy to benzoyl peroxide.   If your skin becomes reddened/irritated stop using the benzoyl peroxide.  Starting two days before surgery, apply as follows: 1. Apply benzoyl peroxide in the morning and at night. Apply after taking a shower. If you are not taking a shower clean entire shoulder front, back, and side along with the armpit with a clean wet washcloth.  2. Place a quarter-sized dollop on your shoulder and rub in thoroughly, making sure to cover the front, back, and side of your shoulder, along with the armpit.   2 days before ____ AM   ____ PM              1 day before ____ AM   ____ PM                         3. Do this twice a day for two days.  (Last application is the night before surgery, AFTER using the CHG soap as described below).  4. Do NOT apply benzoyl peroxide gel on the day of surgery.

## 2020-05-21 ENCOUNTER — Encounter (HOSPITAL_COMMUNITY): Payer: Self-pay

## 2020-05-21 ENCOUNTER — Encounter (HOSPITAL_COMMUNITY)
Admission: RE | Admit: 2020-05-21 | Discharge: 2020-05-21 | Disposition: A | Discharge status: Home / Self Care | Payer: Medicare Other | Source: Ambulatory Visit | Attending: Orthopedic Surgery | Admitting: Orthopedic Surgery

## 2020-05-21 ENCOUNTER — Other Ambulatory Visit: Payer: Self-pay

## 2020-05-21 ENCOUNTER — Ambulatory Visit (HOSPITAL_COMMUNITY)
Admission: RE | Admit: 2020-05-21 | Discharge: 2020-05-21 | Disposition: A | Discharge status: Home / Self Care | Payer: Medicare Other | Source: Ambulatory Visit | Attending: Orthopedic Surgery | Admitting: Orthopedic Surgery

## 2020-05-21 DIAGNOSIS — Z01818 Encounter for other preprocedural examination: Secondary | ICD-10-CM | POA: Insufficient documentation

## 2020-05-21 DIAGNOSIS — Z01811 Encounter for preprocedural respiratory examination: Secondary | ICD-10-CM

## 2020-05-21 HISTORY — DX: Sleep apnea, unspecified: G47.30

## 2020-05-21 HISTORY — DX: Essential (primary) hypertension: I10

## 2020-05-21 HISTORY — DX: Hypothyroidism, unspecified: E03.9

## 2020-05-21 HISTORY — DX: Gastro-esophageal reflux disease without esophagitis: K21.9

## 2020-05-21 LAB — CBC WITH DIFFERENTIAL/PLATELET
Abs Immature Granulocytes: 0.01 10*3/uL (ref 0.00–0.07)
Basophils Absolute: 0.1 10*3/uL (ref 0.0–0.1)
Basophils Relative: 1 %
Eosinophils Absolute: 0.3 10*3/uL (ref 0.0–0.5)
Eosinophils Relative: 6 %
HCT: 35.6 % — ABNORMAL LOW (ref 36.0–46.0)
Hemoglobin: 10.9 g/dL — ABNORMAL LOW (ref 12.0–15.0)
Immature Granulocytes: 0 %
Lymphocytes Relative: 47 %
Lymphs Abs: 2.5 10*3/uL (ref 0.7–4.0)
MCH: 27.9 pg (ref 26.0–34.0)
MCHC: 30.6 g/dL (ref 30.0–36.0)
MCV: 91.3 fL (ref 80.0–100.0)
Monocytes Absolute: 0.6 10*3/uL (ref 0.1–1.0)
Monocytes Relative: 12 %
Neutro Abs: 1.8 10*3/uL (ref 1.7–7.7)
Neutrophils Relative %: 34 %
Platelets: 350 10*3/uL (ref 150–400)
RBC: 3.9 MIL/uL (ref 3.87–5.11)
RDW: 15.4 % (ref 11.5–15.5)
WBC: 5.3 10*3/uL (ref 4.0–10.5)
nRBC: 0 % (ref 0.0–0.2)

## 2020-05-21 LAB — URINALYSIS, ROUTINE W REFLEX MICROSCOPIC
Bilirubin Urine: NEGATIVE
Glucose, UA: NEGATIVE mg/dL
Ketones, ur: NEGATIVE mg/dL
Nitrite: NEGATIVE
Protein, ur: NEGATIVE mg/dL
Specific Gravity, Urine: 1.016 (ref 1.005–1.030)
pH: 5 (ref 5.0–8.0)

## 2020-05-21 LAB — COMPREHENSIVE METABOLIC PANEL
ALT: 19 U/L (ref 0–44)
AST: 23 U/L (ref 15–41)
Albumin: 3.8 g/dL (ref 3.5–5.0)
Alkaline Phosphatase: 125 U/L (ref 38–126)
Anion gap: 8 (ref 5–15)
BUN: 20 mg/dL (ref 6–20)
CO2: 27 mmol/L (ref 22–32)
Calcium: 9.7 mg/dL (ref 8.9–10.3)
Chloride: 106 mmol/L (ref 98–111)
Creatinine, Ser: 0.86 mg/dL (ref 0.44–1.00)
GFR, Estimated: >60 mL/min (ref >60)
Glucose, Bld: 90 mg/dL (ref 70–99)
Potassium: 4.7 mmol/L (ref 3.5–5.1)
Sodium: 141 mmol/L (ref 135–145)
Total Bilirubin: 0.7 mg/dL (ref 0.3–1.2)
Total Protein: 7.3 g/dL (ref 6.5–8.1)

## 2020-05-21 LAB — SURGICAL PCR SCREEN
MRSA, PCR: NEGATIVE
Staphylococcus aureus: NEGATIVE

## 2020-05-21 LAB — APTT: aPTT: 29 seconds (ref 24–36)

## 2020-05-21 NOTE — Progress Notes (Addendum)
Anesthesia Review:  PCP: Jessica Stevens  Cardiologist :none  Chest x-ray :05/21/20 EKG :08/2019  Echo : Stress test: Cardiac Cath :  Activity level: can do a flight of stairs without difficulty  Sleep Study/ CPAP : Fasting Blood Sugar :      / Checks Blood Sugar -- times a day:   Blood Thinner/ Instructions /Last Dose: ASA / Instructions/ Last Dose :  Pt on Phentermine.  Not given any preop instructions regarding Phentermine.  Jessica Stevens made aware.  PT made aware per Jessica Stevens to hold Phentermine for a week prior to surgery. PT voiced understanding.  CBC done 05/21/20 routed to Jessica Stevens U/A done 05/21/20 routed to Jessica Stevens. Marland Kitchen

## 2020-05-24 ENCOUNTER — Other Ambulatory Visit (HOSPITAL_COMMUNITY): Payer: Medicare Other

## 2020-05-26 ENCOUNTER — Encounter (HOSPITAL_COMMUNITY): Payer: Self-pay | Admitting: Orthopedic Surgery

## 2020-05-26 ENCOUNTER — Ambulatory Visit (HOSPITAL_COMMUNITY)
Admission: RE | Admit: 2020-05-26 | Discharge: 2020-05-26 | Disposition: A | Discharge status: Home / Self Care | Payer: Medicare Other | Source: Ambulatory Visit | Attending: Orthopaedic Surgery | Admitting: Orthopaedic Surgery

## 2020-05-26 DIAGNOSIS — Z1389 Encounter for screening for other disorder: Secondary | ICD-10-CM | POA: Diagnosis not present

## 2020-05-26 DIAGNOSIS — Z8669 Personal history of other diseases of the nervous system and sense organs: Secondary | ICD-10-CM | POA: Diagnosis not present

## 2020-05-26 DIAGNOSIS — K219 Gastro-esophageal reflux disease without esophagitis: Secondary | ICD-10-CM | POA: Diagnosis not present

## 2020-05-26 DIAGNOSIS — I10 Essential (primary) hypertension: Secondary | ICD-10-CM | POA: Diagnosis not present

## 2020-05-26 DIAGNOSIS — E785 Hyperlipidemia, unspecified: Secondary | ICD-10-CM | POA: Diagnosis not present

## 2020-05-26 DIAGNOSIS — D649 Anemia, unspecified: Secondary | ICD-10-CM | POA: Diagnosis not present

## 2020-05-26 DIAGNOSIS — M1990 Unspecified osteoarthritis, unspecified site: Secondary | ICD-10-CM | POA: Diagnosis not present

## 2020-05-26 DIAGNOSIS — M5412 Radiculopathy, cervical region: Secondary | ICD-10-CM

## 2020-05-26 MED ORDER — VANCOMYCIN HCL 1500 MG/300ML IV SOLN
1500.0000 mg | INTRAVENOUS | Status: AC
  Administered 2020-05-27: 1500 mg via INTRAVENOUS
  Filled 2020-05-26: qty 300

## 2020-05-26 NOTE — Progress Notes (Signed)
TCT patient concerning missed appointment on 05-24-20 for pre surgical covid test.  Patient states [facility] and was told "they did not need to see me until 14th of next month".  Patient advised to arrive tomorrow morning to [facility] Stay at 5 am for covid testing prior to surgery.  Patient verbalized understanding.

## 2020-05-27 ENCOUNTER — Ambulatory Visit (HOSPITAL_COMMUNITY): Payer: Medicare Other

## 2020-05-27 ENCOUNTER — Ambulatory Visit (HOSPITAL_COMMUNITY): Payer: Medicare Other | Admitting: Anesthesiology

## 2020-05-27 ENCOUNTER — Encounter (HOSPITAL_COMMUNITY): Payer: Self-pay | Admitting: Orthopedic Surgery

## 2020-05-27 ENCOUNTER — Encounter (HOSPITAL_COMMUNITY)
Admission: RE | Disposition: A | Discharge status: Home / Self Care | Payer: Self-pay | Source: Ambulatory Visit | Attending: Orthopedic Surgery

## 2020-05-27 ENCOUNTER — Ambulatory Visit (HOSPITAL_COMMUNITY)
Admission: RE | Admit: 2020-05-27 | Discharge: 2020-05-27 | Disposition: A | Discharge status: Home / Self Care | Payer: Medicare Other | Source: Ambulatory Visit | Attending: Orthopedic Surgery | Admitting: Orthopedic Surgery

## 2020-05-27 DIAGNOSIS — K219 Gastro-esophageal reflux disease without esophagitis: Secondary | ICD-10-CM | POA: Diagnosis not present

## 2020-05-27 DIAGNOSIS — Z880 Allergy status to penicillin: Secondary | ICD-10-CM | POA: Diagnosis not present

## 2020-05-27 DIAGNOSIS — Z471 Aftercare following joint replacement surgery: Secondary | ICD-10-CM | POA: Diagnosis not present

## 2020-05-27 DIAGNOSIS — I10 Essential (primary) hypertension: Secondary | ICD-10-CM | POA: Diagnosis not present

## 2020-05-27 DIAGNOSIS — Z20822 Contact with and (suspected) exposure to covid-19: Secondary | ICD-10-CM | POA: Diagnosis not present

## 2020-05-27 DIAGNOSIS — Z96611 Presence of right artificial shoulder joint: Secondary | ICD-10-CM

## 2020-05-27 DIAGNOSIS — Z79899 Other long term (current) drug therapy: Secondary | ICD-10-CM | POA: Diagnosis not present

## 2020-05-27 DIAGNOSIS — M19011 Primary osteoarthritis, right shoulder: Secondary | ICD-10-CM | POA: Diagnosis not present

## 2020-05-27 DIAGNOSIS — M75101 Unspecified rotator cuff tear or rupture of right shoulder, not specified as traumatic: Secondary | ICD-10-CM | POA: Diagnosis not present

## 2020-05-27 DIAGNOSIS — G8918 Other acute postprocedural pain: Secondary | ICD-10-CM | POA: Diagnosis not present

## 2020-05-27 DIAGNOSIS — E039 Hypothyroidism, unspecified: Secondary | ICD-10-CM | POA: Diagnosis not present

## 2020-05-27 DIAGNOSIS — M12811 Other specific arthropathies, not elsewhere classified, right shoulder: Secondary | ICD-10-CM | POA: Diagnosis not present

## 2020-05-27 HISTORY — PX: REVERSE SHOULDER ARTHROPLASTY: SHX5054

## 2020-05-27 LAB — SARS CORONAVIRUS 2 BY RT PCR (HOSPITAL ORDER, PERFORMED IN ~~LOC~~ HOSPITAL LAB): SARS Coronavirus 2: NEGATIVE

## 2020-05-27 LAB — TYPE AND SCREEN
ABO/RH(D): B POS
Antibody Screen: NEGATIVE

## 2020-05-27 LAB — ABO/RH: ABO/RH(D): B POS

## 2020-05-27 SURGERY — ARTHROPLASTY, SHOULDER, TOTAL, REVERSE
Anesthesia: General | Site: Shoulder | Laterality: Right

## 2020-05-27 MED ORDER — WATER FOR IRRIGATION, STERILE IR SOLN
Status: DC | PRN
  Administered 2020-05-27: 2000 mL

## 2020-05-27 MED ORDER — PHENYLEPHRINE HCL-NACL 10-0.9 MG/250ML-% IV SOLN
INTRAVENOUS | Status: DC | PRN
  Administered 2020-05-27: 60 ug/min via INTRAVENOUS

## 2020-05-27 MED ORDER — LACTATED RINGERS IV SOLN: INTRAVENOUS | Status: DC

## 2020-05-27 MED ORDER — LIDOCAINE 2% (20 MG/ML) 5 ML SYRINGE
INTRAMUSCULAR | Status: AC
  Filled 2020-05-27: qty 15

## 2020-05-27 MED ORDER — LIDOCAINE 2% (20 MG/ML) 5 ML SYRINGE
INTRAMUSCULAR | Status: DC | PRN
  Administered 2020-05-27: 100 mg via INTRAVENOUS

## 2020-05-27 MED ORDER — SODIUM CHLORIDE 0.9 % IR SOLN
Status: DC | PRN
  Administered 2020-05-27: 1000 mL

## 2020-05-27 MED ORDER — HYDROCODONE-ACETAMINOPHEN 5-325 MG PO TABS
1.0000 | ORAL_TABLET | ORAL | 0 refills | Status: DC | PRN
Start: 2020-05-27 — End: 2020-07-04

## 2020-05-27 MED ORDER — HYDROMORPHONE HCL 1 MG/ML IJ SOLN
INTRAMUSCULAR | Status: AC
  Filled 2020-05-27: qty 1

## 2020-05-27 MED ORDER — EPHEDRINE 5 MG/ML INJ
INTRAVENOUS | Status: AC
  Filled 2020-05-27: qty 10

## 2020-05-27 MED ORDER — ROCURONIUM BROMIDE 10 MG/ML (PF) SYRINGE
PREFILLED_SYRINGE | INTRAVENOUS | Status: DC | PRN
  Administered 2020-05-27: 70 mg via INTRAVENOUS

## 2020-05-27 MED ORDER — HYDROMORPHONE HCL 1 MG/ML IJ SOLN
0.2500 mg | INTRAMUSCULAR | Status: DC | PRN
Start: 2020-05-27 — End: 2020-05-28
  Administered 2020-05-27 (×2): 0.5 mg via INTRAVENOUS

## 2020-05-27 MED ORDER — DEXAMETHASONE SODIUM PHOSPHATE 10 MG/ML IJ SOLN
INTRAMUSCULAR | Status: DC | PRN
  Administered 2020-05-27: 8 mg via INTRAVENOUS

## 2020-05-27 MED ORDER — FENTANYL CITRATE (PF) 100 MCG/2ML IJ SOLN
INTRAMUSCULAR | Status: DC | PRN
  Administered 2020-05-27: 50 ug via INTRAVENOUS

## 2020-05-27 MED ORDER — TRANEXAMIC ACID-NACL 1000-0.7 MG/100ML-% IV SOLN
1000.0000 mg | INTRAVENOUS | Status: AC
  Administered 2020-05-27: 1000 mg via INTRAVENOUS
  Filled 2020-05-27: qty 100

## 2020-05-27 MED ORDER — TIZANIDINE HCL 4 MG PO TABS: 4.0000 mg | ORAL_TABLET | Freq: Three times a day (TID) | ORAL | 1 refills | Status: DC | PRN

## 2020-05-27 MED ORDER — 0.9 % SODIUM CHLORIDE (POUR BTL) OPTIME
TOPICAL | Status: DC | PRN
  Administered 2020-05-27: 1000 mL

## 2020-05-27 MED ORDER — BUPIVACAINE HCL (PF) 0.5 % IJ SOLN
INTRAMUSCULAR | Status: DC | PRN
  Administered 2020-05-27: 15 mL via PERINEURAL

## 2020-05-27 MED ORDER — MIDAZOLAM HCL 5 MG/5ML IJ SOLN
INTRAMUSCULAR | Status: DC | PRN
  Administered 2020-05-27: 2 mg via INTRAVENOUS

## 2020-05-27 MED ORDER — PROPOFOL 10 MG/ML IV BOLUS
INTRAVENOUS | Status: DC | PRN
  Administered 2020-05-27: 150 mg via INTRAVENOUS

## 2020-05-27 MED ORDER — ROCURONIUM BROMIDE 10 MG/ML (PF) SYRINGE
PREFILLED_SYRINGE | INTRAVENOUS | Status: AC
  Filled 2020-05-27: qty 30

## 2020-05-27 MED ORDER — SUGAMMADEX SODIUM 500 MG/5ML IV SOLN
INTRAVENOUS | Status: DC | PRN
  Administered 2020-05-27: 300 mg via INTRAVENOUS

## 2020-05-27 MED ORDER — MIDAZOLAM HCL 2 MG/2ML IJ SOLN
INTRAMUSCULAR | Status: AC
  Filled 2020-05-27: qty 2

## 2020-05-27 MED ORDER — ONDANSETRON HCL 4 MG/2ML IJ SOLN
INTRAMUSCULAR | Status: AC
  Filled 2020-05-27: qty 6

## 2020-05-27 MED ORDER — EPHEDRINE SULFATE-NACL 50-0.9 MG/10ML-% IV SOSY
PREFILLED_SYRINGE | INTRAVENOUS | Status: DC | PRN
  Administered 2020-05-27 (×2): 5 mg via INTRAVENOUS
  Administered 2020-05-27 (×2): 10 mg via INTRAVENOUS
  Administered 2020-05-27: 5 mg via INTRAVENOUS
  Administered 2020-05-27: 10 mg via INTRAVENOUS
  Administered 2020-05-27: 5 mg via INTRAVENOUS

## 2020-05-27 MED ORDER — ONDANSETRON HCL 4 MG/2ML IJ SOLN
INTRAMUSCULAR | Status: DC | PRN
  Administered 2020-05-27: 4 mg via INTRAVENOUS

## 2020-05-27 MED ORDER — HYDROCODONE-ACETAMINOPHEN 5-325 MG PO TABS: 1.0000 | ORAL_TABLET | ORAL | 0 refills | Status: DC | PRN

## 2020-05-27 MED ORDER — CHLORHEXIDINE GLUCONATE 0.12 % MT SOLN
15.0000 mL | Freq: Once | OROMUCOSAL | Status: AC
  Administered 2020-05-27: 15 mL via OROMUCOSAL

## 2020-05-27 MED ORDER — PHENYLEPHRINE HCL-NACL 10-0.9 MG/250ML-% IV SOLN
INTRAVENOUS | Status: AC
  Filled 2020-05-27: qty 750

## 2020-05-27 MED ORDER — PROPOFOL 10 MG/ML IV BOLUS
INTRAVENOUS | Status: AC
  Filled 2020-05-27: qty 60

## 2020-05-27 MED ORDER — ACETAMINOPHEN 10 MG/ML IV SOLN: 1000.0000 mg | Freq: Once | INTRAVENOUS | Status: DC | PRN

## 2020-05-27 MED ORDER — BUPIVACAINE LIPOSOME 1.3 % IJ SUSP
INTRAMUSCULAR | Status: DC | PRN
  Administered 2020-05-27: 10 mL via PERINEURAL

## 2020-05-27 MED ORDER — SUGAMMADEX SODIUM 500 MG/5ML IV SOLN
INTRAVENOUS | Status: AC
  Filled 2020-05-27: qty 5

## 2020-05-27 MED ORDER — DEXAMETHASONE SODIUM PHOSPHATE 10 MG/ML IJ SOLN
INTRAMUSCULAR | Status: AC
  Filled 2020-05-27: qty 3

## 2020-05-27 MED ORDER — FENTANYL CITRATE (PF) 100 MCG/2ML IJ SOLN
INTRAMUSCULAR | Status: AC
  Filled 2020-05-27: qty 2

## 2020-05-27 SURGICAL SUPPLY — 76 items
AID PSTN UNV HD RSTRNT DISP (MISCELLANEOUS) ×1
BAG SPEC THK2 15X12 ZIP CLS (MISCELLANEOUS) ×1
BAG ZIPLOCK 12X15 (MISCELLANEOUS) ×2 IMPLANT
BASEPLATE P2 COATD GLND 6.5X30 (Shoulder) ×1 IMPLANT
BIT DRILL 1.6MX128 (BIT) IMPLANT
BIT DRILL 2.5 DIA 127 CALI (BIT) ×2 IMPLANT
BIT DRILL 4 DIA CALIBRATED (BIT) ×2 IMPLANT
BLADE SAW SAG 73X25 THK (BLADE) ×1
BLADE SAW SGTL 73X25 THK (BLADE) ×1 IMPLANT
BOOTIES KNEE HIGH SLOAN (MISCELLANEOUS) ×4 IMPLANT
BSPLAT GLND 30 STRL LF SHLDR (Shoulder) ×1 IMPLANT
COOLER ICEMAN CLASSIC (MISCELLANEOUS) ×2 IMPLANT
COVER BACK TABLE 60X90IN (DRAPES) ×2 IMPLANT
COVER SURGICAL LIGHT HANDLE (MISCELLANEOUS) ×2 IMPLANT
COVER WAND RF STERILE (DRAPES) IMPLANT
DRAPE INCISE IOBAN 66X45 STRL (DRAPES) ×2 IMPLANT
DRAPE ORTHO SPLIT 77X108 STRL (DRAPES) ×4
DRAPE POUCH INSTRU U-SHP 10X18 (DRAPES) ×2 IMPLANT
DRAPE SHEET LG 3/4 BI-LAMINATE (DRAPES) ×2 IMPLANT
DRAPE SURG 17X11 SM STRL (DRAPES) ×2 IMPLANT
DRAPE SURG ORHT 6 SPLT 77X108 (DRAPES) ×2 IMPLANT
DRAPE TOP 10253 STERILE (DRAPES) ×2 IMPLANT
DRAPE U-SHAPE 47X51 STRL (DRAPES) ×2 IMPLANT
DRSG AQUACEL AG ADV 3.5X 6 (GAUZE/BANDAGES/DRESSINGS) ×2 IMPLANT
DURAPREP 26ML APPLICATOR (WOUND CARE) ×4 IMPLANT
ELECT BLADE TIP CTD 4 INCH (ELECTRODE) ×2 IMPLANT
ELECT REM PT RETURN 15FT ADLT (MISCELLANEOUS) ×2 IMPLANT
GLOVE SRG 8 PF TXTR STRL LF DI (GLOVE) ×1 IMPLANT
GLOVE SURG ENC MOIS LTX SZ7 (GLOVE) ×2 IMPLANT
GLOVE SURG ENC MOIS LTX SZ7.5 (GLOVE) ×2 IMPLANT
GLOVE SURG UNDER POLY LF SZ7 (GLOVE) ×2 IMPLANT
GLOVE SURG UNDER POLY LF SZ8 (GLOVE) ×2
GOWN STRL REUS W/TWL LRG LVL3 (GOWN DISPOSABLE) ×2 IMPLANT
GOWN STRL REUS W/TWL XL LVL3 (GOWN DISPOSABLE) ×2 IMPLANT
HANDPIECE INTERPULSE COAX TIP (DISPOSABLE) ×2
HOOD PEEL AWAY FLYTE STAYCOOL (MISCELLANEOUS) ×8 IMPLANT
HUMERA STEM SM SHELL SHOU 10 (Miscellaneous) ×2 IMPLANT
INSERT SMALL SOCKET 32MM (Insert) ×2 IMPLANT
KIT BASIN OR (CUSTOM PROCEDURE TRAY) ×2 IMPLANT
KIT TURNOVER KIT A (KITS) ×2 IMPLANT
MANIFOLD NEPTUNE II (INSTRUMENTS) ×2 IMPLANT
NEEDLE TROCAR POINT SZ 2 1/2 (NEEDLE) IMPLANT
NS IRRIG 1000ML POUR BTL (IV SOLUTION) ×2 IMPLANT
P2 COATDE GLNOID BSEPLT 6.5X30 (Shoulder) ×2 IMPLANT
PACK SHOULDER (CUSTOM PROCEDURE TRAY) ×2 IMPLANT
PAD COLD SHLDR WRAP-ON (PAD) ×2 IMPLANT
PROTECTOR NERVE ULNAR (MISCELLANEOUS) IMPLANT
RESTRAINT HEAD UNIVERSAL NS (MISCELLANEOUS) ×2 IMPLANT
RETRIEVER SUT HEWSON (MISCELLANEOUS) IMPLANT
SCREW BONE LOCKING RSP 5.0X30 (Screw) ×2 IMPLANT
SCREW BONE RSP LOCK 5X18 (Screw) ×2 IMPLANT
SCREW BONE RSP LOCK 5X22 (Screw) ×1 IMPLANT
SCREW BONE RSP LOCK 5X30 (Screw) ×1 IMPLANT
SCREW BONE RSP LOCKING 18MM LG (Screw) ×4 IMPLANT
SCREW BONE RSP LOCKING 5.0X32 (Screw) ×2 IMPLANT
SCREW RETAIN W/HEAD 4MM OFFSET (Shoulder) ×2 IMPLANT
SET HNDPC FAN SPRY TIP SCT (DISPOSABLE) ×1 IMPLANT
SLING ARM IMMOBILIZER LRG (SOFTGOODS) ×2 IMPLANT
SLING ARM IMMOBILIZER MED (SOFTGOODS) IMPLANT
SPONGE LAP 18X18 RF (DISPOSABLE) ×2 IMPLANT
STEM HUMERAL SM SHELL SHOU 10 (Miscellaneous) ×1 IMPLANT
STRIP CLOSURE SKIN 1/2X4 (GAUZE/BANDAGES/DRESSINGS) ×4 IMPLANT
SUCTION FRAZIER HANDLE 10FR (MISCELLANEOUS) ×2
SUCTION TUBE FRAZIER 10FR DISP (MISCELLANEOUS) ×1 IMPLANT
SUPPORT WRAP ARM LG (MISCELLANEOUS) ×2 IMPLANT
SUT ETHIBOND 2 V 37 (SUTURE) IMPLANT
SUT FIBERWIRE #2 38 REV NDL BL (SUTURE)
SUT MNCRL AB 4-0 PS2 18 (SUTURE) ×2 IMPLANT
SUT VIC AB 2-0 CT1 27 (SUTURE) ×2
SUT VIC AB 2-0 CT1 TAPERPNT 27 (SUTURE) ×1 IMPLANT
SUTURE FIBERWR#2 38 REV NDL BL (SUTURE) IMPLANT
TAPE LABRALWHITE 1.5X36 (TAPE) IMPLANT
TAPE SUT LABRALTAP WHT/BLK (SUTURE) IMPLANT
TOWEL OR 17X26 10 PK STRL BLUE (TOWEL DISPOSABLE) ×2 IMPLANT
TOWEL OR NON WOVEN STRL DISP B (DISPOSABLE) ×2 IMPLANT
WATER STERILE IRR 1000ML POUR (IV SOLUTION) ×4 IMPLANT

## 2020-05-27 NOTE — Anesthesia Procedure Notes (Signed)
Anesthesia Regional Block: Interscalene brachial plexus block   Pre-Anesthetic Checklist: ,, timeout performed, Correct Patient, Correct Site, Correct Laterality, Correct Procedure, Correct Position, site marked, Risks and benefits discussed,  Surgical consent,  Pre-op evaluation,  At surgeon's request and post-op pain management  Laterality: Right  Prep: chloraprep       Needles:  Injection technique: Single-shot  Needle Type: Echogenic Needle     Needle Length: 9cm      Additional Needles:   Procedures:,,,, ultrasound used (permanent image in chart),,,,  Narrative:  Start time: 05/27/2020 6:53 AM End time: 05/27/2020 7:04 AM Injection made incrementally with aspirations every 5 mL.  Performed by: Personally  Anesthesiologist: Myrtie Soman, MD  Additional Notes: Patient tolerated the procedure well without complications

## 2020-05-27 NOTE — Anesthesia Procedure Notes (Signed)
Anesthesia Procedure Image    

## 2020-05-27 NOTE — Anesthesia Procedure Notes (Signed)
Procedure Name: Intubation Date/Time: 05/27/2020 7:49 AM Performed by: Lavina Hamman, CRNA Pre-anesthesia Checklist: Patient identified, Emergency Drugs available, Suction available, Patient being monitored and Timeout performed Patient Re-evaluated:Patient Re-evaluated prior to induction Oxygen Delivery Method: Circle system utilized Preoxygenation: Pre-oxygenation with 100% oxygen Induction Type: IV induction Ventilation: Mask ventilation without difficulty Laryngoscope Size: Mac and 4 Grade View: Grade I Tube type: Oral Tube size: 7.0 mm Number of attempts: 1 Airway Equipment and Method: Stylet Placement Confirmation: ETT inserted through vocal cords under direct vision,  positive ETCO2,  CO2 detector and breath sounds checked- equal and bilateral Secured at: 22 cm Tube secured with: Tape Dental Injury: Teeth and Oropharynx as per pre-operative assessment  Comments: ATOI, teeth unchanged.

## 2020-05-27 NOTE — Transfer of Care (Signed)
Immediate Anesthesia Transfer of Care Note  Patient: Jessica Stevens  Procedure(s) Performed: REVERSE SHOULDER ARTHROPLASTY (Right Shoulder)  Patient Location: PACU  Anesthesia Type:GA combined with regional for post-op pain  Level of Consciousness: awake  Airway & Oxygen Therapy: Patient Spontanous Breathing and Patient connected to face mask oxygen  Post-op Assessment: Report given to RN and Post -op Vital signs reviewed and stable  Post vital signs: Reviewed and stable  Last Vitals:  Vitals Value Taken Time  BP 137/89 05/27/20 0917  Temp    Pulse 83 05/27/20 0919  Resp 19 05/27/20 0919  SpO2 100 % 05/27/20 0919  Vitals shown include unvalidated device data.  Last Pain:  Vitals:   05/27/20 0619  TempSrc: Oral  PainSc:       Patients Stated Pain Goal: 7 (40/76/80 8811)  Complications: No complications documented.

## 2020-05-27 NOTE — H&P (Signed)
Jessica Stevens is an 55 y.o. female.   Chief Complaint: R shoulder pain and dysfunction HPI: Endstage R shoulder arthritis with significant pain and dysfunction, failed conservative measures.  Pain interferes with sleep and quality of life.   Past Medical History:  Diagnosis Date  . Ankle pain, left   . Arthritis of knee, degenerative   . Asthma   . Carpal tunnel syndrome   . Cervical radiculopathy   . Chronic back pain   . Complete rupture of rotator cuff   . Complete rupture of rotator cuff   . Depression   . GERD (gastroesophageal reflux disease)   . Gynecomastia   . HNP (herniated nucleus pulposus), cervical   . HNP (herniated nucleus pulposus), cervical   . Hyperlipidemia   . Hypertension   . Hypothyroidism   . Knee pain   . OA (osteoarthritis) of knee   . Obesity   . Sleep apnea    hx of   . Urinary incontinence     Past Surgical History:  Procedure Laterality Date  . arthroscopy  left knee  08/14/2008   Dr. Aline Brochure   . COLONOSCOPY N/A 03/11/2014   Procedure: COLONOSCOPY;  Surgeon: Rogene Houston, MD;  Location: [facility];  Service: Endoscopy;  Laterality: N/A;  730 - moved to 1/13 @ 12:00  . PARTIAL HYSTERECTOMY    . tube insert in right ear  2003    Family History  Problem Relation Age of Onset  . Diabetes Mother   . Hypertension Mother   . Cancer Father        left nephrectomy   . Hypertension Father   . Hypertension Sister   . Diabetes Maternal Grandmother   . Hypertension Maternal Grandmother   . Hypertension Maternal Grandfather    Social History:  reports that she has never smoked. She has never used smokeless tobacco. She reports current alcohol use. She reports current drug use. Drug: Marijuana.  Allergies:  Allergies  Allergen Reactions  . Penicillins Other (See Comments)    According to Allergy Test.  Patient states she is not allergic to this medication  . Oxycodone-Acetaminophen Rash    Medications Prior to Admission   Medication Sig Dispense Refill  . albuterol (PROVENTIL HFA;VENTOLIN HFA) 108 (90 BASE) MCG/ACT inhaler Inhale 2 puffs into the lungs 2 (two) times daily as needed for wheezing or shortness of breath.    Marland Kitchen amLODipine (NORVASC) 10 MG tablet Take 10 mg by mouth daily.    . Biotin 800 MCG TABS Take 800 mcg by mouth 2 (two) times daily. Hair-skin-nail    . buPROPion (WELLBUTRIN XL) 150 MG 24 hr tablet Take 150 mg by mouth 2 (two) times daily.    . busPIRone (BUSPAR) 5 MG tablet Take 5 mg by mouth 2 (two) times daily as needed (stress).    Marland Kitchen diclofenac Sodium (VOLTAREN) 1 % GEL Apply 2 g topically 2 (two) times daily.    . diphenhydrAMINE (BENADRYL) 25 mg capsule Take 25 mg by mouth every 6 (six) hours as needed for itching.    . DULoxetine (CYMBALTA) 60 MG capsule Take 60 mg by mouth 2 (two) times daily.    . fluticasone (FLONASE) 50 MCG/ACT nasal spray Place 1 spray into both nostrils daily for 10 days. 9.9 mL 0  . furosemide (LASIX) 20 MG tablet Take 20 mg by mouth daily.    Marland Kitchen gabapentin (NEURONTIN) 300 MG capsule Take 600 mg by mouth 3 (three) times daily.    Marland Kitchen  HYDROcodone-acetaminophen (NORCO/VICODIN) 5-325 MG per tablet Take 1-2 tablets by mouth every 4 (four) hours as needed. (Patient taking differently: Take 1-2 tablets by mouth every 4 (four) hours as needed for moderate pain or severe pain.) 10 tablet 0  . ibuprofen (ADVIL,MOTRIN) 800 MG tablet Take 1 tablet (800 mg total) by mouth 3 (three) times daily. 21 tablet 0  . levocetirizine (XYZAL) 5 MG tablet Take 5 mg by mouth every evening.    Marland Kitchen levothyroxine (SYNTHROID) 112 MCG tablet Take 112 mcg by mouth daily before breakfast.    . losartan (COZAAR) 100 MG tablet Take 100 mg by mouth daily.    . meloxicam (MOBIC) 15 MG tablet Take 15 mg by mouth daily.    . mometasone-formoterol (DULERA) 100-5 MCG/ACT AERO Inhale 1 puff into the lungs 2 (two) times daily.    . montelukast (SINGULAIR) 10 MG tablet Take 10 mg by mouth at bedtime.    .  pantoprazole (PROTONIX) 40 MG tablet Take 40 mg by mouth daily.    . phentermine (ADIPEX-P) 37.5 MG tablet Take 37.5 mg by mouth daily before breakfast.    . potassium chloride (KLOR-CON) 10 MEQ tablet Take 10 mEq by mouth daily.    . pravastatin (PRAVACHOL) 20 MG tablet Take 20 mg by mouth daily.    . rosuvastatin (CRESTOR) 20 MG tablet Take 20 mg by mouth daily.    Marland Kitchen tiZANidine (ZANAFLEX) 4 MG capsule Take 4 mg by mouth daily as needed for muscle spasms.      Results for orders placed or performed during the hospital encounter of 05/27/20 (from the past 48 hour(s))  SARS Coronavirus 2 by RT PCR (hospital order, performed in [facility] hospital lab) Nasopharyngeal Nasopharyngeal Swab     Status: None   Collection Time: 05/27/20  5:16 AM   Specimen: Nasopharyngeal Swab  Result Value Ref Range   SARS Coronavirus 2 NEGATIVE NEGATIVE    Comment: (NOTE) SARS-CoV-2 target nucleic acids are NOT DETECTED.  The SARS-CoV-2 RNA is generally detectable in upper and lower respiratory specimens during the acute phase of infection. The lowest concentration of SARS-CoV-2 viral copies this assay can detect is 250 copies / mL. A negative result does not preclude SARS-CoV-2 infection and should not be used as the sole basis for treatment or other patient management decisions.  A negative result may occur with improper specimen collection / handling, submission of specimen other than nasopharyngeal swab, presence of viral mutation(s) within the areas targeted by this assay, and inadequate number of viral copies (<250 copies / mL). A negative result must be combined with clinical observations, patient history, and epidemiological information.  Fact Sheet for Patients:   StrictlyIdeas.no  Fact Sheet for Healthcare Providers: BankingDealers.co.za  This test is not yet approved or  cleared by the [place] FDA and has been authorized for detection  and/or diagnosis of SARS-CoV-2 by FDA under an Emergency Use Authorization (EUA).  This EUA will remain in effect (meaning this test can be used) for the duration of the COVID-19 declaration under Section 564(b)(1) of the Act, 21 U.S.C. section 360bbb-3(b)(1), unless the authorization is terminated or revoked sooner.  Performed at [facility], [place] [address], [place], [place] [address]   ABO/Rh     Status: None   Collection Time: 05/27/20  5:50 AM  Result Value Ref Range   ABO/RH(D)      B POS Performed at [facility], [place] [address], [place], [place] [address]  No results found.  Review of Systems  All other systems reviewed and are negative.   Blood pressure (!) 160/92, pulse 76, temperature 98.7 F (37.1 C), temperature source Oral, resp. rate 16, height 5\' 3"  (1.6 m), weight 117 kg, SpO2 100 %. Physical Exam Constitutional:      Appearance: She is obese.  HENT:     Head: Atraumatic.  Eyes:     Extraocular Movements: Extraocular movements intact.  Cardiovascular:     Pulses: Normal pulses.  Pulmonary:     Effort: Pulmonary effort is normal.  Musculoskeletal:     Comments: R shoulder pain with limited ROM. NVID.  Skin:    General: Skin is warm and dry.  Neurological:     Mental Status: She is alert.      Assessment/Plan Advanced R shoulder rotator cuff tear arthropathy Plan R reverse TSA Risks / benefits of surgery discussed Consent on chart  NPO for OR Preop antibiotics   Isabella Stalling, MD 05/27/2020, 7:16 AM

## 2020-05-27 NOTE — Brief Op Note (Addendum)
Patient's fingers to right arm warm to touch. Radial pulse good. Color appropriate for ethnicity. Not able to wiggle fingers as yet.   Patient appears comfortably breathing on R/A. However she tends to desaturate while relaxing especially at rest between 83-90.    Repositioned to high fowler position  and incentive spirometer in use. Sat lingers between 88 and 91%.  27 Dr Kalman Shan (anesthesiologist) informed. Patient should remain in PACU for another 68mins of observation. May have to be admitted -as per Dr Kalman Shan.

## 2020-05-27 NOTE — Anesthesia Postprocedure Evaluation (Signed)
Anesthesia Post Note  Patient: Jessica Stevens  Procedure(s) Performed: REVERSE SHOULDER ARTHROPLASTY (Right Shoulder)     Patient location during evaluation: PACU Anesthesia Type: General Level of consciousness: awake and alert Pain management: pain level controlled Vital Signs Assessment: post-procedure vital signs reviewed and stable Respiratory status: spontaneous breathing, nonlabored ventilation, respiratory function stable and patient connected to nasal cannula oxygen Cardiovascular status: blood pressure returned to baseline and stable Postop Assessment: no apparent nausea or vomiting Anesthetic complications: no   No complications documented.  Last Vitals:  Vitals:   05/27/20 0930 05/27/20 0945  BP: (!) 139/98 133/88  Pulse: 87 81  Resp: 11 12  Temp:    SpO2: 100% 100%    Last Pain:  Vitals:   05/27/20 0945  TempSrc:   PainSc: 5                  Shyann Hefner S

## 2020-05-27 NOTE — Anesthesia Preprocedure Evaluation (Signed)
Anesthesia Evaluation  Patient identified by MRN, date of birth, ID band Patient awake    Reviewed: Allergy & Precautions, NPO status , Patient's Chart, lab work & pertinent test results  Airway Mallampati: II  TM Distance: <3 FB Neck ROM: Full    Dental no notable dental hx.    Pulmonary asthma , sleep apnea ,    Pulmonary exam normal breath sounds clear to auscultation + decreased breath sounds      Cardiovascular hypertension, Normal cardiovascular exam Rhythm:Regular Rate:Normal     Neuro/Psych negative neurological ROS  negative psych ROS   GI/Hepatic Neg liver ROS, GERD  ,  Endo/Other  Hypothyroidism Morbid obesity  Renal/GU negative Renal ROS  negative genitourinary   Musculoskeletal  (+) Arthritis , Osteoarthritis,    Abdominal   Peds negative pediatric ROS (+)  Hematology negative hematology ROS (+)   Anesthesia Other Findings   Reproductive/Obstetrics negative OB ROS                             Anesthesia Physical Anesthesia Plan  ASA: III  Anesthesia Plan: General   Post-op Pain Management:  Regional for Post-op pain   Induction: Intravenous  PONV Risk Score and Plan: 3 and Ondansetron, Dexamethasone and Treatment may vary due to age or medical condition  Airway Management Planned: Oral ETT  Additional Equipment:   Intra-op Plan:   Post-operative Plan: Extubation in OR  Informed Consent: I have reviewed the patients History and Physical, chart, labs and discussed the procedure including the risks, benefits and alternatives for the proposed anesthesia with the patient or authorized representative who has indicated his/her understanding and acceptance.     Dental advisory given  Plan Discussed with: CRNA and Surgeon  Anesthesia Plan Comments:         Anesthesia Quick Evaluation

## 2020-05-27 NOTE — Op Note (Signed)
Procedure(s): REVERSE SHOULDER ARTHROPLASTY Procedure Note  Jessica Stevens female 55 y.o. 05/27/2020   Preoperative diagnosis: Right shoulder rotator cuff tear arthropathy  Postoperative diagnosis: Same  Procedure(s) and Anesthesia Type:    Right REVERSE SHOULDER ARTHROPLASTY - Choice   Indications:  55 y.o. female  With endstage right shoulder arthritis with irrepairable rotator cuff tear. Pain and dysfunction interfered with quality of life and nonoperative treatment with activity modification, NSAIDS and injections failed.     Surgeon: Isabella Stalling   Assistants: Jeanmarie Hubert PA-C [facility] was present and scrubbed throughout the procedure and was essential in positioning, retraction, exposure, and closure)  Anesthesia: General endotracheal anesthesia with preoperative interscalene block given by the attending anesthesiologist    Procedure Detail  REVERSE SHOULDER ARTHROPLASTY   Estimated Blood Loss:  200 mL         Drains: none  Blood Given: none          Specimens: none        Complications:  * No complications entered in OR log *         Disposition: PACU - hemodynamically stable.         Condition: stable      OPERATIVE FINDINGS:  A DJO Altivate pressfit reverse total shoulder arthroplasty was placed with a  size 10 stem, a 32-4 glenosphere, and a +4-mm poly insert. The base plate  fixation was excellent.  PROCEDURE: The patient was identified in the preoperative holding area  where I personally marked the operative site after verifying site, side,  and procedure with the patient. An interscalene block given by  the attending anesthesiologist in the holding area and the patient was taken back to the operating room where all extremities were  carefully padded in position after general anesthesia was induced. She  was placed in a beach-chair position and the operative upper extremity was  prepped and draped in a standard sterile fashion. An  approximately 10-  cm incision was made from the tip of the coracoid process to the center  point of the humerus at the level of the axilla. Dissection was carried  down through subcutaneous tissues to the level of the cephalic vein  which was taken laterally with the deltoid. The pectoralis major was  retracted medially. The subdeltoid space was developed and the lateral  edge of the conjoined tendon was identified. The undersurface of  conjoined tendon was palpated and the musculocutaneous nerve was not in  the field. Retractor was placed underneath the conjoined and second  retractor was placed lateral into the deltoid. The circumflex humeral  artery and vessels were identified and clamped and coagulated. The  biceps tendon was absent.  The subscapularis was taken down as a peel with the underlying capsule.  The  joint was then gently externally rotated while the capsule was released  from the humeral neck around to just beyond the 6 o'clock position. At  this point, the joint was dislocated and the humeral head was presented  into the wound. The excessive osteophyte formation was removed with a  large rongeur.  The cutting guide was used to make the appropriate  head cut and the head was saved for potentially bone grafting.  The glenoid was exposed with the arm in an  abducted extended position. The anterior and posterior labrum were  completely excised and the capsule was released circumferentially to  allow for exposure of the glenoid for preparation. The 2.5 mm drill was  placed using  the guide in 5-10 inferior angulation and the tap was then advanced in the same hole. Small and large reamers were then used. The tap was then removed and the Metaglene was then screwed in with excellent purchase.  The peripheral guide was then used to drilled measured and filled peripheral locking screws. The size 32-4 glenosphere was then impacted on the [facility] taper and the central screw was placed. The  humerus was then again exposed and the diaphyseal reamers were used followed by the metaphyseal reamers. The final broach was left in place in the proximal trial was placed. The joint was reduced and with this implant it was felt that soft tissue tensioning was appropriate with excellent stability and excellent range of motion. Therefore, final humeral stem was placed press-fit.  And then the trial polyethylene inserts were tested again and the above implant was felt to be the most appropriate for final insertion. The joint was reduced taken through full range of motion and felt to be stable. Soft tissue tension was appropriate.  The joint was then copiously irrigated with pulse  lavage and the wound was then closed. The subscapularis was not repaired.  Skin was closed with 2-0 Vicryl in a deep dermal layer and 4-0  Monocryl for skin closure. Steri-Strips were applied. Sterile  dressings were then applied as well as a sling. The patient was allowed  to awaken from general anesthesia, transferred to stretcher, and taken  to recovery room in stable condition.   POSTOPERATIVE PLAN: The patient will be observed in the recovery room and if her pain is well controlled and she is hemodynamically stable she would prefer to be discharged home today with family.

## 2020-05-27 NOTE — Discharge Instructions (Signed)
Interscalene Nerve Block, Care After This sheet gives you information about how to care for yourself after your procedure. Your health care provider may also give you more specific instructions. If you have problems or questions, contact your health care provider. What can I expect after the procedure? After the procedure, it is common to have:  Neck soreness or tenderness.  Numbness in your shoulder, upper arm, and some fingers.  Weakness in your shoulder and arm muscles. You may also have:  Voice changes.  A droopy eyelid.  Trouble swallowing.  A stuffy nose. Feeling and strength in your shoulder, arm, and fingers should return to normal within hours after your procedure or within a few days if you have a long, thin tube (nerve block catheter) in place to deliver medicine for the nerve block. Follow these instructions at home: Medicines  Take over-the-counter and prescription medicines only as told by your health care provider.  For the time period you were told by your health care provider, do not take sleeping pills or medicines that cause drowsiness.  Ask your health care provider if the medicine prescribed to you: ? Requires you to avoid driving or using machinery. ? Can cause constipation. You may need to take these actions to prevent or treat constipation:  Drink enough fluid to keep your urine pale yellow.  Take over-the-counter or prescription medicines.  Eat foods that are high in fiber, such as beans, whole grains, and fresh fruits and vegetables.  Limit foods that are high in fat and processed sugars, such as fried or sweet foods. Eating and drinking  Follow instructions from your health care provider about eating or drinking restrictions.  If you vomit, drink small sips of water, juice, or soup when you can drink without vomiting.  If you have trouble swallowing, take small bites when eating and small sips of water when drinking until you are able to swallow  normally.  Make sure you have little or no nausea before eating solid foods. If you have a sling:  Wear it as told by your health care provider. Remove it only as told by your health care provider.  Loosen it if your fingers tingle, become numb, or turn cold and blue.  Make sure that your entire arm, including your wrist, is supported. Do not let your wrist dangle over the end of the sling.  Keep it clean.  If the sling is not waterproof: ? Do not let it get wet. ? Cover it with a watertight covering when you take a bath or shower. Bathing  Do not take baths, swim, or use a hot tub until your health care provider approves. Ask your health care provider if you may take showers. You may only be allowed to take sponge baths.  If you have a nerve block catheter in place, keep the injection site and tubing dry. Injection site care  Follow instructions from your health care provider about how to take care of your nerve block injection site. Make sure you: ? Wash your hands with soap and water for at least 20 seconds before and after you change your bandage (dressing). If soap and water are not available, use hand sanitizer. ? Change your dressing as told by your health care provider.  Keep your dressing dry.  Check your injection site every day for signs of infection. Check for: ? Redness, swelling, or pain. ? Fluid or blood. ? Warmth. ? Pus or a bad smell.   Activity  For the  time period you were told by your health care provider: ? Do not participate in activities where you could fall or become injured. ? Do not drink alcohol. ? Do not make important decisions or sign legal documents. ? Do not take care of children on your own.  Do not lift anything that is heavier than 10 lb (4.5 kg), or the limit that you are told, until your health care provider says that it is safe.  Do not drive or use machinery until you have fully recovered. Ask your health care provider when it is safe  to drive.  Rest as told by your health care provider. This will help you recover faster.  Be very careful until you have regained strength and feeling.  Return to your normal activities as told by your health care provider. Ask your health care provider what activities are safe for you.   General instructions  Have a responsible adult stay with you until the feeling in your shoulder, arm, and fingers returns to normal.  Do not use any products that contain nicotine or tobacco. These products include cigarettes, chewing tobacco, and vaping devices, such as e-cigarettes. These can delay healing. If you need help quitting, ask your health care provider.  Do not expose your arm or shoulder to very cold or very hot temperatures until you have full feeling back.  If you have a nerve block catheter in place: ? Try to keep the catheter from getting kinked or pinched. ? Avoid pulling or tugging on the catheter.  Keep all follow-up visits. This is important. Contact a health care provider if:  You have a fever or chills.  You have any of these signs of infection: ? Redness, swelling, or pain around your injection site. ? Fluid or blood coming from your injection site. ? Warmth coming from your injection site. ? Pus or a bad smell coming from your injection site.  You have hoarseness, a drooping eye, or dry eye that lasts more than a few days.  You have pain that is poorly controlled with the block or with pain medicine.  You have numbness, tingling, or weakness in your shoulder or arm that lasts for more than 1 week. Get help right away if you:  Have severe pain.  Lose or do not regain strength and feeling in your arm even after the nerve block medicine has stopped.  Have trouble breathing. These symptoms may represent a serious problem that is an emergency. Do not wait to see if the symptoms will go away. Get medical help right away. Call your local emergency services (911 in the  [place] Do not drive yourself to the hospital. Summary  After the procedure, it is common to have neck soreness, weakness in the shoulder and arm muscles, and numbness in the upper arm, shoulder, and some fingers.  Follow instructions from your health care provider about how to take care of your nerve block injection site.  Keep all follow-up visits. This information is not intended to replace advice given to you by your health care provider. Make sure you discuss any questions you have with your health care provider. Document Revised: 08/19/2019 Document Reviewed: 08/19/2019 Elsevier Patient Education  [place]. Discharge Instructions after Reverse Total Shoulder Arthroplasty   . A sling has been provided for you. You are to wear this at all times (except for bathing and dressing), until your first post operative visit with Dr. Tamera Punt. Please also wear while sleeping at night. While  you bath and dress, let the arm/elbow extend straight down to stretch your elbow. Wiggle your fingers and pump your first while your in the sling to prevent hand swelling. . Use ice on the shoulder intermittently over the first 48 hours after surgery. Continue to use ice or and ice machine as needed after 48 hours for pain control/swelling.  . Pain medicine has been prescribed for you.  . Use your medicine liberally over the first 48 hours, and then you can begin to taper your use. You may take Extra Strength Tylenol or Tylenol only in place of the pain pills. DO NOT take ANY nonsteroidal anti-inflammatory pain medications: Advil, Motrin, Ibuprofen, Aleve, Naproxen or Naprosyn.  . Take one aspirin a day for 2 weeks after surgery, unless you have an aspirin sensitivity/allergy or asthma.  . Leave your dressing on until your first follow up visit.  You may shower with the dressing.  Hold your arm as if you still have your sling on while you shower. . Simply allow the water to wash over the site and then pat  dry. Make sure your axilla (armpit) is completely dry after showering.    Please call [phone] during normal business hours or [phone] after hours for any problems. Including the following:  - excessive redness of the incisions - drainage for more than 4 days - fever of more than 101.5 F  *Please note that pain medications will not be refilled after hours or on weekends.   Dental Antibiotics:  In most cases prophylactic antibiotics for Dental procdeures after total joint surgery are not necessary.  Exceptions are as follows:  1. History of prior total joint infection  2. Severely immunocompromised (Organ Transplant, cancer chemotherapy, Rheumatoid biologic meds such as [place])  3. Poorly controlled diabetes (A1C &gt; 8.0, blood glucose over 200)  If you have one of these conditions, contact your surgeon for an antibiotic prescription, prior to your dental procedure.   CONSTIPATION  Constipation is defined medically as fewer than three stools per week and severe constipation as less than one stool per week.  Even if you have a regular bowel pattern at home, your normal regimen is likely to be disrupted due to multiple reasons following surgery.  Combination of anesthesia, postoperative narcotics, change in appetite and fluid intake all can affect your bowels.   YOU MUST use at least one of the following options; they are listed in order of increasing strength to get the job done.  They are all available over the counter, and you may need to use some, POSSIBLY even all of these options:    Drink plenty of fluids (prune juice may be helpful) and high fiber foods Colace 100 mg by mouth twice a day  Senokot for constipation as directed and as needed Dulcolax (bisacodyl), take with full glass of water  Miralax (polyethylene glycol) once or twice a day as needed.  If you have tried all these things and are unable to have a bowel movement in the first 3-4 days after surgery  call either your surgeon or your primary doctor.    If you experience loose stools or diarrhea, hold the medications until you stool forms back up.  If your symptoms do not get better within 1 week or if they get worse, check with your doctor.  If you experience "the worst abdominal pain ever" or develop nausea or vomiting, please contact the office immediately for further recommendations for treatment.   ITCHING:  If you experience  itching with your medications, try taking only a single pain pill, or even half a pain pill at a time.  You can also use Benadryl over the counter for itching or also to help with sleep.

## 2020-05-28 ENCOUNTER — Other Ambulatory Visit (HOSPITAL_COMMUNITY): Payer: Self-pay | Admitting: Surgery

## 2020-05-28 ENCOUNTER — Other Ambulatory Visit: Payer: Self-pay | Admitting: Surgery

## 2020-05-28 ENCOUNTER — Encounter (HOSPITAL_COMMUNITY): Payer: Self-pay | Admitting: Orthopedic Surgery

## 2020-06-02 ENCOUNTER — Encounter (HOSPITAL_COMMUNITY): Payer: Self-pay

## 2020-06-02 ENCOUNTER — Ambulatory Visit (HOSPITAL_COMMUNITY): Payer: Medicare Other

## 2020-06-03 ENCOUNTER — Inpatient Hospital Stay (HOSPITAL_COMMUNITY): Admission: RE | Admit: 2020-06-03 | Payer: Medicare Other | Source: Ambulatory Visit

## 2020-06-09 DIAGNOSIS — Z471 Aftercare following joint replacement surgery: Secondary | ICD-10-CM | POA: Diagnosis not present

## 2020-06-09 DIAGNOSIS — Z9889 Other specified postprocedural states: Secondary | ICD-10-CM | POA: Diagnosis not present

## 2020-06-09 DIAGNOSIS — Z96611 Presence of right artificial shoulder joint: Secondary | ICD-10-CM | POA: Diagnosis not present

## 2020-06-10 DIAGNOSIS — M5412 Radiculopathy, cervical region: Secondary | ICD-10-CM | POA: Diagnosis not present

## 2020-06-10 DIAGNOSIS — M50121 Cervical disc disorder at C4-C5 level with radiculopathy: Secondary | ICD-10-CM | POA: Diagnosis not present

## 2020-06-10 DIAGNOSIS — M4722 Other spondylosis with radiculopathy, cervical region: Secondary | ICD-10-CM | POA: Diagnosis not present

## 2020-06-10 DIAGNOSIS — M50122 Cervical disc disorder at C5-C6 level with radiculopathy: Secondary | ICD-10-CM | POA: Diagnosis not present

## 2020-06-14 ENCOUNTER — Inpatient Hospital Stay (HOSPITAL_COMMUNITY): Admission: RE | Admit: 2020-06-14 | Payer: Medicare Other | Source: Ambulatory Visit

## 2020-06-15 ENCOUNTER — Other Ambulatory Visit: Payer: Self-pay

## 2020-06-15 ENCOUNTER — Ambulatory Visit (HOSPITAL_COMMUNITY)
Admission: RE | Admit: 2020-06-15 | Discharge: 2020-06-15 | Disposition: A | Discharge status: Home / Self Care | Payer: Medicare Other | Source: Ambulatory Visit | Attending: Internal Medicine | Admitting: Internal Medicine

## 2020-06-15 DIAGNOSIS — M85851 Other specified disorders of bone density and structure, right thigh: Secondary | ICD-10-CM | POA: Insufficient documentation

## 2020-06-15 DIAGNOSIS — Z780 Asymptomatic menopausal state: Secondary | ICD-10-CM | POA: Diagnosis not present

## 2020-06-15 DIAGNOSIS — Z13820 Encounter for screening for osteoporosis: Secondary | ICD-10-CM | POA: Insufficient documentation

## 2020-06-16 ENCOUNTER — Ambulatory Visit (HOSPITAL_COMMUNITY): Payer: Medicare Other | Attending: Orthopaedic Surgery

## 2020-06-19 ENCOUNTER — Emergency Department (HOSPITAL_COMMUNITY): Payer: Medicare Other

## 2020-06-19 ENCOUNTER — Encounter (HOSPITAL_COMMUNITY): Payer: Self-pay | Admitting: Emergency Medicine

## 2020-06-19 ENCOUNTER — Other Ambulatory Visit: Payer: Self-pay

## 2020-06-19 ENCOUNTER — Emergency Department (HOSPITAL_COMMUNITY)
Admission: EM | Admit: 2020-06-19 | Discharge: 2020-06-19 | Disposition: A | Discharge status: Home / Self Care | Payer: Medicare Other | Attending: Emergency Medicine | Admitting: Emergency Medicine

## 2020-06-19 DIAGNOSIS — Z7951 Long term (current) use of inhaled steroids: Secondary | ICD-10-CM | POA: Insufficient documentation

## 2020-06-19 DIAGNOSIS — I10 Essential (primary) hypertension: Secondary | ICD-10-CM | POA: Insufficient documentation

## 2020-06-19 DIAGNOSIS — M5432 Sciatica, left side: Secondary | ICD-10-CM

## 2020-06-19 DIAGNOSIS — Z96611 Presence of right artificial shoulder joint: Secondary | ICD-10-CM | POA: Insufficient documentation

## 2020-06-19 DIAGNOSIS — Z79899 Other long term (current) drug therapy: Secondary | ICD-10-CM | POA: Diagnosis not present

## 2020-06-19 DIAGNOSIS — M5442 Lumbago with sciatica, left side: Secondary | ICD-10-CM | POA: Insufficient documentation

## 2020-06-19 DIAGNOSIS — M1712 Unilateral primary osteoarthritis, left knee: Secondary | ICD-10-CM | POA: Insufficient documentation

## 2020-06-19 DIAGNOSIS — J45909 Unspecified asthma, uncomplicated: Secondary | ICD-10-CM | POA: Insufficient documentation

## 2020-06-19 DIAGNOSIS — E039 Hypothyroidism, unspecified: Secondary | ICD-10-CM | POA: Insufficient documentation

## 2020-06-19 DIAGNOSIS — M25562 Pain in left knee: Secondary | ICD-10-CM | POA: Diagnosis not present

## 2020-06-19 MED ORDER — KETOROLAC TROMETHAMINE 60 MG/2ML IM SOLN
60.0000 mg | Freq: Once | INTRAMUSCULAR | Status: AC
  Administered 2020-06-19: 60 mg via INTRAMUSCULAR
  Filled 2020-06-19: qty 2

## 2020-06-19 NOTE — Discharge Instructions (Addendum)
The x-ray of your knee this evening shows severe osteoarthritis.  Please follow-up with your orthopedic provider or your primary care provider for recheck.

## 2020-06-19 NOTE — ED Triage Notes (Signed)
Pt c/o chronic left knee pain and swelling. States that today she has been having pain that starts in her lower back and shoots down her left leg. Pt has hx of sciatica

## 2020-06-19 NOTE — ED Provider Notes (Signed)
[facility] EMERGENCY DEPARTMENT Provider Note   CSN: [id number] Arrival date & time: 06/19/20  1949     History Chief Complaint  Patient presents with  . Knee Pain    Jessica Stevens is a 55 y.o. female.  HPI      Jessica Stevens is a 55 y.o. female with past medical history of osteo-arthritis of her left knee, hypertension, and chronic back pain who presents to the Emergency Department complaining of increasing pain to her left leg and left lower back.  She describes a constant aching pain to the anterior portion of her left knee.  She is seen by orthopedics for her knee and a knee replacement has been recommended, but she states she has been advised that she will need to lose weight before they can perform surgery.  She takes hydrocodone 5/325 2 tablets every 4 hours with minimal relief.  She has tried Voltaren gel to her knee without improvement.  She states that at times when she stands she feels as though her left knee is going to "give away."  She denies any recent falls but states that she has fell several times in the past due to her knee pain.  She also describes worsening of her low back pain with stinging type pain radiating from her buttock into her hip and left leg.  This is worse with walking or standing.  She denies any urine or bowel changes, fever or chills, abdominal pain, chest pain or shortness of breath.  No numbness or weakness to her left leg.    Past Medical History:  Diagnosis Date  . Ankle pain, left   . Arthritis of knee, degenerative   . Asthma   . Carpal tunnel syndrome   . Cervical radiculopathy   . Chronic back pain   . Complete rupture of rotator cuff   . Complete rupture of rotator cuff   . Depression   . GERD (gastroesophageal reflux disease)   . Gynecomastia   . HNP (herniated nucleus pulposus), cervical   . HNP (herniated nucleus pulposus), cervical   . Hyperlipidemia   . Hypertension   . Hypothyroidism   . Knee pain   . OA  (osteoarthritis) of knee   . Obesity   . Sleep apnea    hx of   . Urinary incontinence     Patient Active Problem List   Diagnosis Date Noted  . Other bursal cyst, right shoulder 01/16/2018  . Primary osteoarthritis of left knee 01/16/2018  . Rotator cuff arthropathy, right 01/16/2018  . Primary localized osteoarthrosis of right shoulder region 11/22/2016  . Status post arthroscopy of right shoulder 11/22/2016  . Guaiac positive stools 12/30/2013  . GERD 03/25/2010  . GUAIAC POSITIVE STOOL 03/25/2010  . NECK PAIN, CHRONIC 03/25/2010  . HEADACHE 12/30/2009  . IMPAIRED FASTING GLUCOSE 12/30/2009  . ROM 11/26/2009  . TINEA CRURIS 06/29/2009  . CANDIDIASIS 06/29/2009  . ECZEMA 03/17/2009  . CARPAL TUNNEL SYNDROME 12/15/2008  . INSOMNIA 09/13/2008  . BACK PAIN, LUMBAR, WITH RADICULOPATHY 09/09/2008  . HYPERLIPIDEMIA 08/09/2008  . FOOT PAIN, BILATERAL 08/04/2008  . KNEE, ARTHRITIS, DEGEN./OSTEO 07/29/2008  . FATIGUE 05/18/2008  . ALLERGIC RHINITIS 10/17/2007  . SHOULDER PAIN, BILATERAL 10/17/2007  . CERVICAL RADICULOPATHY 10/07/2007  . ANKLE PAIN, LEFT 08/19/2007  . OBESITY, UNSPECIFIED 06/25/2007  . DEPRESSION 06/25/2007  . ASTHMA, UNSPECIFIED, UNSPECIFIED STATUS 06/25/2007  . GYNECOMASTIA 06/25/2007  . URINARY INCONTINENCE 06/25/2007    Past Surgical History:  Procedure Laterality Date  .  arthroscopy  left knee  08/14/2008   Dr. Aline Brochure   . COLONOSCOPY N/A 03/11/2014   Procedure: COLONOSCOPY;  Surgeon: Rogene Houston, MD;  Location: [facility] ENDO SUITE;  Service: Endoscopy;  Laterality: N/A;  730 - moved to 1/13 @ 12:00  . PARTIAL HYSTERECTOMY    . REVERSE SHOULDER ARTHROPLASTY Right 05/27/2020   Procedure: REVERSE SHOULDER ARTHROPLASTY;  Surgeon: Tania Ade, MD;  Location: [facility] ORS;  Service: Orthopedics;  Laterality: Right;  . tube insert in right ear  2003     OB History   No obstetric history on file.     Family History  Problem Relation Age of Onset  .  Diabetes Mother   . Hypertension Mother   . Cancer Father        left nephrectomy   . Hypertension Father   . Hypertension Sister   . Diabetes Maternal Grandmother   . Hypertension Maternal Grandmother   . Hypertension Maternal Grandfather     Social History   Tobacco Use  . Smoking status: Never Smoker  . Smokeless tobacco: Never Used  Vaping Use  . Vaping Use: Never used  Substance Use Topics  . Alcohol use: Yes    Comment: occasionally   . Drug use: Yes    Types: Marijuana    Comment: occasionally     Home Medications Prior to Admission medications   Medication Sig Start Date End Date Taking? Authorizing Provider  albuterol (PROVENTIL HFA;VENTOLIN HFA) 108 (90 BASE) MCG/ACT inhaler Inhale 2 puffs into the lungs 2 (two) times daily as needed for wheezing or shortness of breath.    [provider]  amLODipine (NORVASC) 10 MG tablet Take 10 mg by mouth daily.    [provider]  Biotin 800 MCG TABS Take 800 mcg by mouth 2 (two) times daily. Hair-skin-nail    [provider]  buPROPion (WELLBUTRIN XL) 150 MG 24 hr tablet Take 150 mg by mouth 2 (two) times daily. 11/05/18   [provider]  busPIRone (BUSPAR) 5 MG tablet Take 5 mg by mouth 2 (two) times daily as needed (stress). 04/25/20   [provider]  diclofenac Sodium (VOLTAREN) 1 % GEL Apply 2 g topically 2 (two) times daily.    [provider]  diphenhydrAMINE (BENADRYL) 25 mg capsule Take 25 mg by mouth every 6 (six) hours as needed for itching.    [provider]  DULoxetine (CYMBALTA) 60 MG capsule Take 60 mg by mouth 2 (two) times daily. 11/05/18   [provider]  fluticasone (FLONASE) 50 MCG/ACT nasal spray Place 1 spray into both nostrils daily for 10 days. 09/14/19 09/24/19  Evalee Jefferson, PA-C  furosemide (LASIX) 20 MG tablet Take 20 mg by mouth daily.    [provider]  gabapentin (NEURONTIN) 300 MG capsule Take 600 mg by mouth 3 (three)  times daily. 11/05/18   [provider]  HYDROcodone-acetaminophen (NORCO) 5-325 MG tablet Take 1-2 tablets by mouth every 4 (four) hours as needed for moderate pain. 05/27/20   Grier Mitts, PA-C  HYDROcodone-acetaminophen (NORCO/VICODIN) 5-325 MG tablet Take 1-2 tablets by mouth every 4 (four) hours as needed. 05/27/20   Grier Mitts, PA-C  levocetirizine (XYZAL) 5 MG tablet Take 5 mg by mouth every evening.    [provider]  levothyroxine (SYNTHROID) 112 MCG tablet Take 112 mcg by mouth daily before breakfast. 11/05/18   [provider]  losartan (COZAAR) 100 MG tablet Take 100 mg by mouth daily.  [provider]  mometasone-formoterol (DULERA) 100-5 MCG/ACT AERO Inhale 1 puff into the lungs 2 (two) times daily.    [provider]  montelukast (SINGULAIR) 10 MG tablet Take 10 mg by mouth at bedtime.    [provider]  pantoprazole (PROTONIX) 40 MG tablet Take 40 mg by mouth daily.    [provider]  phentermine (ADIPEX-P) 37.5 MG tablet Take 37.5 mg by mouth daily before breakfast. 09/06/18   [provider]  potassium chloride (KLOR-CON) 10 MEQ tablet Take 10 mEq by mouth daily.    [provider]  pravastatin (PRAVACHOL) 20 MG tablet Take 20 mg by mouth daily. 11/05/18   [provider]  rosuvastatin (CRESTOR) 20 MG tablet Take 20 mg by mouth daily. 04/25/20   [provider]  tiZANidine (ZANAFLEX) 4 MG capsule Take 4 mg by mouth daily as needed for muscle spasms. 11/05/18   [provider]  tiZANidine (ZANAFLEX) 4 MG tablet Take 1 tablet (4 mg total) by mouth every 8 (eight) hours as needed for muscle spasms. 05/27/20   Grier Mitts, PA-C    Allergies    Penicillins and Oxycodone-acetaminophen  Review of Systems   Review of Systems  Constitutional: Negative for chills and fever.  Respiratory: Negative for shortness of breath.   Cardiovascular: Negative for chest  pain.  Gastrointestinal: Negative for abdominal pain, nausea and vomiting.  Genitourinary: Negative for flank pain.  Musculoskeletal: Positive for arthralgias (left knee pain) and back pain. Negative for joint swelling and neck pain.  Skin: Negative for color change and wound.  Neurological: Negative for dizziness, weakness, numbness and headaches.    Physical Exam Updated Vital Signs BP (!) 142/92   Pulse 84   Temp 98.1 F (36.7 C)   Resp 18   Ht 5\' 1"  (1.549 m)   Wt 118.8 kg   SpO2 98%   BMI 49.50 kg/m   Physical Exam Vitals and nursing note reviewed.  Constitutional:      General: She is not in acute distress.    Appearance: Normal appearance.  Cardiovascular:     Rate and Rhythm: Normal rate and regular rhythm.     Pulses: Normal pulses.  Pulmonary:     Effort: Pulmonary effort is normal.     Breath sounds: Normal breath sounds.  Chest:     Chest wall: No tenderness.  Musculoskeletal:        General: Tenderness present. No swelling.     Left knee: No swelling, effusion or erythema. Decreased range of motion. No ACL laxity.Normal pulse.     Instability Tests: Anterior drawer test negative.     Comments: Tender to palpation of the anterior left knee.  Crepitus on range of motion.  Negative drawer sign.  No palpable effusion. No excessive warmth or erythema.  Skin:    General: Skin is warm.     Capillary Refill: Capillary refill takes less than 2 seconds.     Findings: No erythema or rash.  Neurological:     General: No focal deficit present.     Mental Status: She is alert.     Sensory: No sensory deficit.     Motor: No weakness.     ED Results / Procedures / Treatments   Labs (all labs ordered are listed, but only abnormal results are displayed) Labs Reviewed - No data to display  EKG None  Radiology DG Knee Complete 4 Views Left  Result Date: 06/19/2020 CLINICAL DATA:  Knee pain after  falling today and yesterday. EXAM: LEFT KNEE - COMPLETE 4+ VIEW  COMPARISON:  Knee radiographs dated 10/23/2012. FINDINGS: No evidence of fracture, dislocation, or joint effusion. Moderate to severe tricompartmental osteoarthritis is noted. IMPRESSION: No acute osseous injury. Electronically Signed   By: Zerita Boers M.D.   On: 06/19/2020 20:55    Procedures Procedures   Medications Ordered in ED Medications  ketorolac (TORADOL) injection 60 mg (has no administration in time range)    ED Course  I have reviewed the triage vital signs and the nursing notes.  Pertinent labs & imaging results that were available during my care of the patient were reviewed by me and considered in my medical decision making (see chart for details).    MDM Rules/Calculators/A&P                          Patient here with likely acute on chronic left low back pain and left knee pain.  Has history of sciatica and osteoarthritis of the left knee.  No recent injury of the knee.  Neurovascularly intact.  On review of medical record, no recent imaging of the left knee.  Will obtain x-ray today and address her pain.  Review of past medical records, patient had normal serum creatinine 4 weeks ago.  Will give IM Toradol here.  No history of renal insufficiency.  On recheck, patient reports knee pain much improved.  X-ray of the knee shows moderate to severe tricompartmental OA.  Patient awaiting orthopedic approval for knee brace.  Knee pain and back pain likely acute on chronic.  No concerning symptoms for septic joint.  Patient is ambulatory.  Has hydrocodone at home.  She agrees to follow-up with her orthopedic provider early next week.  Appears appropriate for d/c home.   PDMP reviewed.  Final Clinical Impression(s) / ED Diagnoses Final diagnoses:  Osteoarthritis of left knee, unspecified osteoarthritis type  Sciatica of left side    Rx / DC Orders ED Discharge Orders    None       Bufford Lope 06/19/20 2253    Isla Pence, MD 06/20/20 1552

## 2020-06-19 NOTE — ED Notes (Signed)
Pt ambulatory to restroom with no difficulty or distress noted. 

## 2020-06-27 DIAGNOSIS — M1990 Unspecified osteoarthritis, unspecified site: Secondary | ICD-10-CM | POA: Diagnosis not present

## 2020-06-29 DIAGNOSIS — J309 Allergic rhinitis, unspecified: Secondary | ICD-10-CM | POA: Diagnosis not present

## 2020-06-29 DIAGNOSIS — I10 Essential (primary) hypertension: Secondary | ICD-10-CM | POA: Diagnosis not present

## 2020-06-29 DIAGNOSIS — M545 Low back pain, unspecified: Secondary | ICD-10-CM | POA: Diagnosis not present

## 2020-06-29 DIAGNOSIS — N183 Chronic kidney disease, stage 3 unspecified: Secondary | ICD-10-CM | POA: Diagnosis not present

## 2020-06-29 DIAGNOSIS — K219 Gastro-esophageal reflux disease without esophagitis: Secondary | ICD-10-CM | POA: Diagnosis not present

## 2020-06-29 DIAGNOSIS — D473 Essential (hemorrhagic) thrombocythemia: Secondary | ICD-10-CM | POA: Diagnosis not present

## 2020-06-29 DIAGNOSIS — M170 Bilateral primary osteoarthritis of knee: Secondary | ICD-10-CM | POA: Diagnosis not present

## 2020-06-29 DIAGNOSIS — D649 Anemia, unspecified: Secondary | ICD-10-CM | POA: Diagnosis not present

## 2020-06-29 DIAGNOSIS — E782 Mixed hyperlipidemia: Secondary | ICD-10-CM | POA: Diagnosis not present

## 2020-07-04 ENCOUNTER — Encounter (HOSPITAL_COMMUNITY): Payer: Self-pay

## 2020-07-04 ENCOUNTER — Emergency Department (HOSPITAL_COMMUNITY)
Admission: EM | Admit: 2020-07-04 | Discharge: 2020-07-05 | Disposition: A | Discharge status: Home / Self Care | Payer: Medicare Other | Attending: Emergency Medicine | Admitting: Emergency Medicine

## 2020-07-04 ENCOUNTER — Other Ambulatory Visit: Payer: Self-pay

## 2020-07-04 DIAGNOSIS — M25562 Pain in left knee: Secondary | ICD-10-CM | POA: Insufficient documentation

## 2020-07-04 DIAGNOSIS — E039 Hypothyroidism, unspecified: Secondary | ICD-10-CM | POA: Insufficient documentation

## 2020-07-04 DIAGNOSIS — I10 Essential (primary) hypertension: Secondary | ICD-10-CM | POA: Diagnosis not present

## 2020-07-04 DIAGNOSIS — Z79899 Other long term (current) drug therapy: Secondary | ICD-10-CM | POA: Diagnosis not present

## 2020-07-04 DIAGNOSIS — M1990 Unspecified osteoarthritis, unspecified site: Secondary | ICD-10-CM

## 2020-07-04 DIAGNOSIS — M1712 Unilateral primary osteoarthritis, left knee: Secondary | ICD-10-CM | POA: Diagnosis not present

## 2020-07-04 DIAGNOSIS — J45909 Unspecified asthma, uncomplicated: Secondary | ICD-10-CM | POA: Diagnosis not present

## 2020-07-04 MED ORDER — PREDNISONE 20 MG PO TABS
40.0000 mg | ORAL_TABLET | Freq: Once | ORAL | Status: AC
  Administered 2020-07-04: 40 mg via ORAL
  Filled 2020-07-04: qty 2

## 2020-07-04 MED ORDER — PREDNISONE 10 MG PO TABS: 20.0000 mg | ORAL_TABLET | Freq: Two times a day (BID) | ORAL | 0 refills | Status: DC

## 2020-07-04 MED ORDER — HYDROCODONE-ACETAMINOPHEN 5-325 MG PO TABS: 1.0000 | ORAL_TABLET | Freq: Four times a day (QID) | ORAL | 0 refills | Status: DC | PRN

## 2020-07-04 NOTE — Discharge Instructions (Addendum)
Begin taking prednisone as prescribed.  Continue taking hydrocodone as needed for pain.  Follow-up with your primary doctor and orthopedic surgeon to discuss your situation.

## 2020-07-04 NOTE — ED Provider Notes (Signed)
[facility] EMERGENCY DEPARTMENT Provider Note   CSN: [id number] Arrival date & time: 07/04/20  2207     History Chief Complaint  Patient presents with  . Leg Pain    Jessica Stevens is a 55 y.o. female.  Patient is a 55 year old female with past medical history of degenerative disc disease, osteoarthritis of both knees, obesity, asthma, hypertension.  Patient presents today for evaluation of left knee pain.  This has been an ongoing problem for quite some time.  She has been seen by orthopedics and her primary doctor.  Orthopedics is reluctant to operate secondary to her obesity and want her to lose weight first.  She is under consideration for weight loss surgery, however this has not materialized.  Over the past several weeks, she has had worsening discomfort and weakness in her knee.  She describes the knee "giving out on her" occasionally when she ambulates.  She denies any new injury or trauma.  She denies any fevers or chills.  She was seen here approximately 2 weeks ago with similar symptoms.  At that time an x-ray showed osteoarthritis, but was otherwise unremarkable.  The history is provided by the patient.       Past Medical History:  Diagnosis Date  . Ankle pain, left   . Arthritis of knee, degenerative   . Asthma   . Carpal tunnel syndrome   . Cervical radiculopathy   . Chronic back pain   . Complete rupture of rotator cuff   . Complete rupture of rotator cuff   . Depression   . GERD (gastroesophageal reflux disease)   . Gynecomastia   . HNP (herniated nucleus pulposus), cervical   . HNP (herniated nucleus pulposus), cervical   . Hyperlipidemia   . Hypertension   . Hypothyroidism   . Knee pain   . OA (osteoarthritis) of knee   . Obesity   . Sleep apnea    hx of   . Urinary incontinence     Patient Active Problem List   Diagnosis Date Noted  . Other bursal cyst, right shoulder 01/16/2018  . Primary osteoarthritis of left knee 01/16/2018  . Rotator  cuff arthropathy, right 01/16/2018  . Primary localized osteoarthrosis of right shoulder region 11/22/2016  . Status post arthroscopy of right shoulder 11/22/2016  . Guaiac positive stools 12/30/2013  . GERD 03/25/2010  . GUAIAC POSITIVE STOOL 03/25/2010  . NECK PAIN, CHRONIC 03/25/2010  . HEADACHE 12/30/2009  . IMPAIRED FASTING GLUCOSE 12/30/2009  . ROM 11/26/2009  . TINEA CRURIS 06/29/2009  . CANDIDIASIS 06/29/2009  . ECZEMA 03/17/2009  . CARPAL TUNNEL SYNDROME 12/15/2008  . INSOMNIA 09/13/2008  . BACK PAIN, LUMBAR, WITH RADICULOPATHY 09/09/2008  . HYPERLIPIDEMIA 08/09/2008  . FOOT PAIN, BILATERAL 08/04/2008  . KNEE, ARTHRITIS, DEGEN./OSTEO 07/29/2008  . FATIGUE 05/18/2008  . ALLERGIC RHINITIS 10/17/2007  . SHOULDER PAIN, BILATERAL 10/17/2007  . CERVICAL RADICULOPATHY 10/07/2007  . ANKLE PAIN, LEFT 08/19/2007  . OBESITY, UNSPECIFIED 06/25/2007  . DEPRESSION 06/25/2007  . ASTHMA, UNSPECIFIED, UNSPECIFIED STATUS 06/25/2007  . GYNECOMASTIA 06/25/2007  . URINARY INCONTINENCE 06/25/2007    Past Surgical History:  Procedure Laterality Date  . arthroscopy  left knee  08/14/2008   Dr. Aline Brochure   . COLONOSCOPY N/A 03/11/2014   Procedure: COLONOSCOPY;  Surgeon: Rogene Houston, MD;  Location: [facility] SUITE;  Service: Endoscopy;  Laterality: N/A;  730 - moved to 1/13 @ 12:00  . PARTIAL HYSTERECTOMY    . REVERSE SHOULDER ARTHROPLASTY Right 05/27/2020  Procedure: REVERSE SHOULDER ARTHROPLASTY;  Surgeon: Tania Ade, MD;  Location: [facility] ORS;  Service: Orthopedics;  Laterality: Right;  . tube insert in right ear  2003     OB History   No obstetric history on file.     Family History  Problem Relation Age of Onset  . Diabetes Mother   . Hypertension Mother   . Cancer Father        left nephrectomy   . Hypertension Father   . Hypertension Sister   . Diabetes Maternal Grandmother   . Hypertension Maternal Grandmother   . Hypertension Maternal Grandfather     Social  History   Tobacco Use  . Smoking status: Never Smoker  . Smokeless tobacco: Never Used  Vaping Use  . Vaping Use: Never used  Substance Use Topics  . Alcohol use: Yes    Comment: occasionally   . Drug use: Yes    Types: Marijuana    Comment: occasionally     Home Medications Prior to Admission medications   Medication Sig Start Date End Date Taking? Authorizing Provider  albuterol (PROVENTIL HFA;VENTOLIN HFA) 108 (90 BASE) MCG/ACT inhaler Inhale 2 puffs into the lungs 2 (two) times daily as needed for wheezing or shortness of breath.    [provider]  amLODipine (NORVASC) 10 MG tablet Take 10 mg by mouth daily.    [provider]  Biotin 800 MCG TABS Take 800 mcg by mouth 2 (two) times daily. Hair-skin-nail    [provider]  buPROPion (WELLBUTRIN XL) 150 MG 24 hr tablet Take 150 mg by mouth 2 (two) times daily. 11/05/18   [provider]  busPIRone (BUSPAR) 5 MG tablet Take 5 mg by mouth 2 (two) times daily as needed (stress). 04/25/20   [provider]  diclofenac Sodium (VOLTAREN) 1 % GEL Apply 2 g topically 2 (two) times daily.    [provider]  diphenhydrAMINE (BENADRYL) 25 mg capsule Take 25 mg by mouth every 6 (six) hours as needed for itching.    [provider]  DULoxetine (CYMBALTA) 60 MG capsule Take 60 mg by mouth 2 (two) times daily. 11/05/18   [provider]  fluticasone (FLONASE) 50 MCG/ACT nasal spray Place 1 spray into both nostrils daily for 10 days. 09/14/19 09/24/19  Evalee Jefferson, PA-C  furosemide (LASIX) 20 MG tablet Take 20 mg by mouth daily.    [provider]  gabapentin (NEURONTIN) 300 MG capsule Take 600 mg by mouth 3 (three) times daily. 11/05/18   [provider]  HYDROcodone-acetaminophen (NORCO) 5-325 MG tablet Take 1-2 tablets by mouth every 4 (four) hours as needed for moderate pain. 05/27/20   Grier Mitts, PA-C  HYDROcodone-acetaminophen (NORCO/VICODIN) 5-325 MG  tablet Take 1-2 tablets by mouth every 4 (four) hours as needed. 05/27/20   Grier Mitts, PA-C  levocetirizine (XYZAL) 5 MG tablet Take 5 mg by mouth every evening.    [provider]  levothyroxine (SYNTHROID) 112 MCG tablet Take 112 mcg by mouth daily before breakfast. 11/05/18   [provider]  losartan (COZAAR) 100 MG tablet Take 100 mg by mouth daily.    [provider]  mometasone-formoterol (DULERA) 100-5 MCG/ACT AERO Inhale 1 puff into the lungs 2 (two) times daily.    [provider]  montelukast (SINGULAIR) 10 MG tablet Take 10 mg by mouth at bedtime.    [provider]  pantoprazole (PROTONIX) 40 MG tablet Take 40 mg by mouth daily.  [provider]  phentermine (ADIPEX-P) 37.5 MG tablet Take 37.5 mg by mouth daily before breakfast. 09/06/18   [provider]  potassium chloride (KLOR-CON) 10 MEQ tablet Take 10 mEq by mouth daily.    [provider]  pravastatin (PRAVACHOL) 20 MG tablet Take 20 mg by mouth daily. 11/05/18   [provider]  rosuvastatin (CRESTOR) 20 MG tablet Take 20 mg by mouth daily. 04/25/20   [provider]  tiZANidine (ZANAFLEX) 4 MG capsule Take 4 mg by mouth daily as needed for muscle spasms. 11/05/18   [provider]  tiZANidine (ZANAFLEX) 4 MG tablet Take 1 tablet (4 mg total) by mouth every 8 (eight) hours as needed for muscle spasms. 05/27/20   Grier Mitts, PA-C    Allergies    Penicillins and Oxycodone-acetaminophen  Review of Systems   Review of Systems  All other systems reviewed and are negative.   Physical Exam Updated Vital Signs BP (!) 160/108   Pulse 93   Temp 98.1 F (36.7 C) (Oral)   Resp 20   Ht 5\' 1"  (1.549 m)   Wt 118.8 kg   SpO2 99%   BMI 49.50 kg/m   Physical Exam Vitals and nursing note reviewed.  Constitutional:      General: She is not in acute distress.    Appearance: Normal appearance. She is not ill-appearing.   HENT:     Head: Normocephalic and atraumatic.  Pulmonary:     Effort: Pulmonary effort is normal.  Musculoskeletal:     Comments: The left knee appears grossly and symmetrical with the right.  There is no palpable effusion.  She has pain with range of motion, but no crepitus.  There is no significant laxity with varus or valgus stress and anterior and posterior drawer tests are negative.  DP pulses are easily palpable bilaterally.  Motor and sensation are intact to both feet.  Skin:    General: Skin is warm and dry.  Neurological:     Mental Status: She is alert and oriented to person, place, and time.     ED Results / Procedures / Treatments   Labs (all labs ordered are listed, but only abnormal results are displayed) Labs Reviewed - No data to display  EKG None  Radiology No results found.  Procedures Procedures   Medications Ordered in ED Medications  predniSONE (DELTASONE) tablet 40 mg (has no administration in time range)    ED Course  I have reviewed the triage vital signs and the nursing notes.  Pertinent labs & imaging results that were available during my care of the patient were reviewed by me and considered in my medical decision making (see chart for details).    MDM Rules/Calculators/A&P  Patient presenting with left knee pain as described in the HPI.  Patient has degenerative joint disease and is under consideration for knee replacement pending weight loss.  This problem seems more chronic in nature.  I highly doubt a septic joint or other emergent situation.  She had x-rays 2 weeks ago and I do not feel as though there is need to be repeated tonight as there has been no new injury or trauma.  Patient to be treated with a course of prednisone.  She is to follow-up with her primary doctor/orthopedist to discuss her situation.  Final Clinical Impression(s) / ED Diagnoses Final diagnoses:  None    Rx / DC Orders ED Discharge Orders    None  Veryl Speak, MD 07/04/20 [phone]

## 2020-07-04 NOTE — ED Triage Notes (Signed)
Pt arrives via POV from home c/o left lower back and left leg pain. Pt reports this is a chronic issue and was seen here last month for same reason. Pt reports frequent falls due to leg pain.

## 2020-07-07 DIAGNOSIS — Z471 Aftercare following joint replacement surgery: Secondary | ICD-10-CM | POA: Diagnosis not present

## 2020-07-07 DIAGNOSIS — Z9889 Other specified postprocedural states: Secondary | ICD-10-CM | POA: Diagnosis not present

## 2020-07-07 DIAGNOSIS — Z96611 Presence of right artificial shoulder joint: Secondary | ICD-10-CM | POA: Diagnosis not present

## 2020-07-09 ENCOUNTER — Ambulatory Visit (HOSPITAL_COMMUNITY): Payer: Medicare Other | Attending: Orthopaedic Surgery

## 2020-07-19 DIAGNOSIS — M50122 Cervical disc disorder at C5-C6 level with radiculopathy: Secondary | ICD-10-CM | POA: Diagnosis not present

## 2020-07-20 ENCOUNTER — Other Ambulatory Visit: Payer: Self-pay

## 2020-07-20 ENCOUNTER — Encounter: Payer: Medicare Other | Attending: Surgery | Admitting: Skilled Nursing Facility1

## 2020-07-20 ENCOUNTER — Encounter: Payer: Self-pay | Admitting: Skilled Nursing Facility1

## 2020-07-20 DIAGNOSIS — I10 Essential (primary) hypertension: Secondary | ICD-10-CM | POA: Diagnosis not present

## 2020-07-20 DIAGNOSIS — Z6841 Body Mass Index (BMI) 40.0 and over, adult: Secondary | ICD-10-CM | POA: Insufficient documentation

## 2020-07-20 DIAGNOSIS — Z713 Dietary counseling and surveillance: Secondary | ICD-10-CM | POA: Diagnosis not present

## 2020-07-20 DIAGNOSIS — F32A Depression, unspecified: Secondary | ICD-10-CM | POA: Insufficient documentation

## 2020-07-20 DIAGNOSIS — E669 Obesity, unspecified: Secondary | ICD-10-CM

## 2020-07-20 NOTE — Progress Notes (Signed)
Nutrition Assessment for Bariatric Surgery Medical Nutrition Therapy Appt Start Time: 8:37  End Time: 9:40  Patient was seen on 07/20/2020 for Pre-Operative Nutrition Assessment. Letter of approval faxed to [facility] Surgery bariatric surgery program coordinator on 07/20/2020   Referral stated Supervised Weight Loss (SWL) visits needed: 6  Planned surgery: RYGB Pt expectation of surgery: to lose weight for a knee replacement  Pt expectation of dietitian: none identified     NUTRITION ASSESSMENT   Anthropometrics  Start weight at [facility]: 264 lbs (date: 07/20/2020)  Height: 61 in BMI: 49.88 kg/m2     Clinical  Medical hx: depression, HTN Medications: see list; multivitamin, calcium Labs: Hemoglobin 10.9, HCT 35.6 Notable signs/symptoms: back and knee pain Any previous deficiencies? anemia  Micronutrient Nutrition Focused Physical Exam: Hair: No issues observed Eyes: No issues observed Mouth: No issues observed Neck: No issues observed Nails: No issues observed Skin: No issues observed  Lifestyle & Dietary Hx  Pt states she feels she has an eating disorder because every time she goes in the kitchen she eats and does not feel control over this behavior. Pt states if she finds something to do she will not eat like that but her knee hurts so bad she cannot do the things that would distract. Pt states she would like to talk to a mental health professional.  Pt states she was drinking a protein shake but it was too sweet.  Pt states she intentionally tries to eat in between 12pm and 6pm.  Pt states she eats 3 fruits a day. Pt states she feels she does not have control over the amount she eats in a day. Pt states her knee causes a lot of pain which causes anxiety.  Pt states she cannot read and write very well. Pt states she is hard of hearing but hearing aides have helped.    24-Hr Dietary Recall First Meal: skipped Snack: yogurt Second Meal: ramen noodles Snack:  fruit Third Meal: bologna and cheese sandwich  Snack: jello or applesauce  Beverages: water   Estimated Energy Needs Calories: 1600   NUTRITION DIAGNOSIS  Overweight/obesity ([place]-3.3) related to past poor dietary habits and physical inactivity as evidenced by patient w/ planned RYGB surgery following dietary guidelines for continued weight loss.    NUTRITION INTERVENTION  Nutrition counseling (C-1) and education (E-2) to facilitate bariatric surgery goals.  Educated pt on micronutrient deficiencies post surgery and strategies to mitigate that risk   Pre-Op Goals Reviewed with the Patient . Track food and beverage intake pen and paper . Make healthy food choices while monitoring portion sizes . Consume 3 meals per day or try to eat every 3-5 hours: try yogurt for breakfast . Avoid concentrated sugars and fried foods . Keep sugar & fat in the single digits per serving on food labels . Practice CHEWING your food (aim for applesauce consistency) . Practice not drinking 15 minutes before, during, and 30 minutes after each meal and snack . Avoid all carbonated beverages (ex: soda, sparkling beverages)  . Limit caffeinated beverages (ex: coffee, tea, energy drinks) . Avoid all sugar-sweetened beverages (ex: regular soda, sports drinks)  . Avoid alcohol  . Aim for 64-100 ounces of FLUID daily (with at least half of fluid intake being plain water)  . Aim for at least 60-80 grams of PROTEIN daily . Look for a liquid protein source that contains ?15 g protein and ?5 g carbohydrate (ex: shakes, drinks, shots) . Make a list of non-food related activities .  Physical activity is an important part of a healthy lifestyle so keep it moving! The goal is to reach 150 minutes of exercise per week, including cardiovascular and weight baring activity. . Read your nutrition facts label   *Goals that are bolded indicate the pt would like to start working towards these  Handouts Provided Include   . Bariatric Surgery handouts (Nutrition Visits, Pre-Op Goals, Protein Shakes, Vitamins & Minerals) . Mental health providers in area  [place] for Change Teaching method utilized: Visual & Auditory  Demonstrated degree of understanding via: Teach Back  Readiness Level: contemplative  Barriers to learning/adherence to lifestyle change: poor literacy   RD's Notes for Next Visit . Assess pts adherence to chosen goals     MONITORING & EVALUATION Dietary intake, weekly physical activity, body weight, and pre-op goals reached at next nutrition visit.    Next Steps  Patient is to follow up at [facility] for Pre-Op Class >2 weeks before surgery for further nutrition education.  Return for next SWL

## 2020-08-04 DIAGNOSIS — R531 Weakness: Secondary | ICD-10-CM | POA: Diagnosis not present

## 2020-08-04 DIAGNOSIS — M25611 Stiffness of right shoulder, not elsewhere classified: Secondary | ICD-10-CM | POA: Diagnosis not present

## 2020-08-04 DIAGNOSIS — Z96611 Presence of right artificial shoulder joint: Secondary | ICD-10-CM | POA: Diagnosis not present

## 2020-08-13 DIAGNOSIS — M25569 Pain in unspecified knee: Secondary | ICD-10-CM | POA: Diagnosis not present

## 2020-08-16 DIAGNOSIS — M50122 Cervical disc disorder at C5-C6 level with radiculopathy: Secondary | ICD-10-CM | POA: Diagnosis not present

## 2020-08-16 DIAGNOSIS — M4722 Other spondylosis with radiculopathy, cervical region: Secondary | ICD-10-CM | POA: Diagnosis not present

## 2020-08-16 DIAGNOSIS — I10 Essential (primary) hypertension: Secondary | ICD-10-CM | POA: Diagnosis not present

## 2020-08-18 DIAGNOSIS — E782 Mixed hyperlipidemia: Secondary | ICD-10-CM | POA: Diagnosis not present

## 2020-08-18 DIAGNOSIS — M545 Low back pain, unspecified: Secondary | ICD-10-CM | POA: Diagnosis not present

## 2020-08-18 DIAGNOSIS — D649 Anemia, unspecified: Secondary | ICD-10-CM | POA: Diagnosis not present

## 2020-08-18 DIAGNOSIS — E039 Hypothyroidism, unspecified: Secondary | ICD-10-CM | POA: Diagnosis not present

## 2020-08-18 DIAGNOSIS — K219 Gastro-esophageal reflux disease without esophagitis: Secondary | ICD-10-CM | POA: Diagnosis not present

## 2020-08-18 DIAGNOSIS — D473 Essential (hemorrhagic) thrombocythemia: Secondary | ICD-10-CM | POA: Diagnosis not present

## 2020-08-18 DIAGNOSIS — J4540 Moderate persistent asthma, uncomplicated: Secondary | ICD-10-CM | POA: Diagnosis not present

## 2020-08-18 DIAGNOSIS — J309 Allergic rhinitis, unspecified: Secondary | ICD-10-CM | POA: Diagnosis not present

## 2020-08-18 DIAGNOSIS — M170 Bilateral primary osteoarthritis of knee: Secondary | ICD-10-CM | POA: Diagnosis not present

## 2020-08-24 ENCOUNTER — Other Ambulatory Visit: Payer: Self-pay

## 2020-08-24 ENCOUNTER — Encounter: Payer: Medicare Other | Attending: Surgery | Admitting: Skilled Nursing Facility1

## 2020-08-24 DIAGNOSIS — E669 Obesity, unspecified: Secondary | ICD-10-CM | POA: Insufficient documentation

## 2020-08-24 NOTE — Progress Notes (Signed)
Supervised Weight Loss Visit Bariatric Nutrition Education  2 out of 6 SWL Appointments    NUTRITION ASSESSMENT  Anthropometrics  Start weight at [facility]: 264 lbs (date: 07/20/2020)  Weight: 271.3 Height: 61 in BMI: 51.34 kg/m2     Clinical  Medical hx: depression, HTN Medications: see list; multivitamin, calcium Labs: Hemoglobin 10.9, HCT 35.6 Notable signs/symptoms: back and knee pain Any previous deficiencies? anemia  Lifestyle & Dietary Hx  Pt states her knee causes a lot of pain which causes anxiety.  Pt states she cannot read and write very well. Pt states she is hard of hearing but hearing aides have helped.    Pt state she falls a lot but never resulting in injury.  Pt states she sits with her aunt until 1pm.  Pt state she will have her sisters over on the weekends and they will all eat calorically dense foods Friday, Saturday, Sunday.  Pt states she gets food from the food bank so she will not lose her place in line.  Pt state she struggles with grocery shopping due to not having confidence she can find a  buggie to ride. Pt states she tried to log her food and drinks but forgot her book stating she was only able to do it for 3 days.   Pt states she has no will power in her. Dietitian asked pt if she believed the changes discussed would lead to her reaching her goals of being physically independent and feeling overall well: pt replied no, Dietitian attempted to assist pt in making those connections to increase the likely hood of her making lifestyle changes.   Estimated daily fluid intake: unknwon oz Supplements: multivitamin, vitamin D Current average weekly physical activity: ADL's  24-Hr Dietary Recall First Meal 1:66 egg + 1 slice of bread Snack: 2 cups of applesauce  Second Meal: skipped Snack:  Third Meal 6:30 pm bologna sandwich Snack:  Beverages: water, juice   Estimated Energy Needs Calories: 1500   NUTRITION DIAGNOSIS  Overweight/obesity ([place]-3.3)  related to past poor dietary habits and physical inactivity as evidenced by patient w/ planned RYGB surgery following dietary guidelines for continued weight loss.   NUTRITION INTERVENTION  Nutrition counseling (C-1) and education (E-2) to facilitate bariatric surgery goals.  Pre-Op Goals Progress & New Goals Continue: Read your nutrition facts label (accomplished) Continue: Track food and beverage intake pen and paper (not accomplished) Continue: Consume 3 meals per day or try to eat every 3-5 hours: try yogurt for breakfast (not accomplished) NEW: call the number on the back of your insurance card to talk to a mental health professional  NEW: Order groceries online  NEW: take pictures of everything you eat and drink NEW: Eat lunch  Handouts Provided Include  N/A  Learning Style & Readiness for Change Teaching method utilized: Visual & Auditory  Demonstrated degree of understanding via: Teach Back  Readiness Level: contemplative  Barriers to learning/adherence to lifestyle change: some disabilities   RD's Notes for next Visit  Assess pts adherence to chosen goals   MONITORING & EVALUATION Dietary intake, weekly physical activity, body weight, and pre-op goals in 1 month.   Next Steps  Patient is to return to [facility] for next appt

## 2020-08-26 DIAGNOSIS — M1990 Unspecified osteoarthritis, unspecified site: Secondary | ICD-10-CM | POA: Diagnosis not present

## 2020-08-31 ENCOUNTER — Encounter: Payer: Self-pay | Admitting: Gastroenterology

## 2020-09-01 ENCOUNTER — Telehealth: Payer: Self-pay

## 2020-09-01 NOTE — Telephone Encounter (Signed)
This patient is scheduled 7/29 for an EGD with [facility] before bariatric surgery, I just wanted to make sure you didn't want to see her in an OV or is it ok to proceed direct?  Thanks

## 2020-09-01 NOTE — Telephone Encounter (Signed)
Called patient to inform her that the procedure would need to be done at [facility].  Patient states she had a previous colonoscopy at [facility] but does not remember when.  She would like to proceed with the EGD only.  Informed patient that she would be contacted to get the procedure rescheduled at [facility].  Patient verbalized understanding.

## 2020-09-01 NOTE — Telephone Encounter (Signed)
Jessica Stevens, Patients with a BMI greater than or equal to 50 are not candidates for the [facility], and therefore must have the procedures done in the [place] outpatient endoscopy department.  In addition, I do not see any prior colon cancer screening for this patient.  She was seen by the [facility] group in November 2015 for heme positive stool, and a colonoscopy was planned at that time.  However, it does not appear to have been performed as there is no colonoscopy report on file.  If this patient does not recall having had a screening colonoscopy in another healthcare system, then she is due for that as well, and it would be best to have it performed at the same time as her upper endoscopy.  Those procedures can be directly booked without an office visit.  We recently set up open access [facility] outpatient endoscopy slots for all of our providers, and direct access patients like this can be put in with any of those physicians (even when another provider answers to questions/phone call such as this).  Please check with [facility] nurse management and [facility] head nurse for more information on this if you are unfamiliar with that new scheduling arrangement.  - HD ___________________  [name], in case this scheduling question comes to you.  - HD

## 2020-09-01 NOTE — Telephone Encounter (Signed)
BMI 51.34 on 08/24/2020-is this patient an appropriate candidate for [facility]?  Thanks

## 2020-09-06 ENCOUNTER — Other Ambulatory Visit: Payer: Self-pay

## 2020-09-06 DIAGNOSIS — K219 Gastro-esophageal reflux disease without esophagitis: Secondary | ICD-10-CM

## 2020-09-06 DIAGNOSIS — E669 Obesity, unspecified: Secondary | ICD-10-CM

## 2020-09-06 NOTE — Telephone Encounter (Signed)
Spoke with patient to reschedule her EGD at [facility]. Patient will keep virtual pre-visit as scheduled on Friday, 09/06/20 at 8 am. EGD at [facility] on Monday, 09/20/20 at 10:30 AM, arriving at 9 AM with a care partner. Patient states that she has had a colonoscopy in the past but years ago. She does not wish to proceed at this time. Patient verbalized understanding of all information and had no concerns at the end of the call.

## 2020-09-10 ENCOUNTER — Other Ambulatory Visit: Payer: Self-pay

## 2020-09-10 ENCOUNTER — Ambulatory Visit (AMBULATORY_SURGERY_CENTER): Payer: Medicare Other

## 2020-09-10 VITALS — Ht 61.0 in | Wt 265.0 lb

## 2020-09-10 DIAGNOSIS — E669 Obesity, unspecified: Secondary | ICD-10-CM

## 2020-09-10 DIAGNOSIS — K219 Gastro-esophageal reflux disease without esophagitis: Secondary | ICD-10-CM

## 2020-09-10 NOTE — Progress Notes (Signed)
Patient's pre-visit was done today over the phone with the patient due to COVID-19 pandemic. Name,DOB and address verified. Insurance verified. Patient denies any allergies to Eggs.  She said she has never ate soy. Patient denies any problems with anesthesia/sedation. Patient denies taking blood thinners. She takes PENTERMINE and was instructed to hold today starting 09/10/20.  No home Oxygen. Packet of Prep instructions mailed to patient including a copy of a consent form-pt is aware. Patient understands to call us back with any questions or concerns. Patient is aware of our care-partner policy and [id number] safety protocol.   Pt reported she has not had any COVID vaccines.  Pt's EGD is scheduled at [facility]. Pt is aware to arrive at [facility]. maw

## 2020-09-14 NOTE — Progress Notes (Signed)
Attempted to obtain medical history via telephone, unable to reach at this time. I left a voicemail to return pre surgical testing department's phone call.  

## 2020-09-15 ENCOUNTER — Ambulatory Visit (HOSPITAL_COMMUNITY): Payer: Medicare Other | Attending: Orthopedic Surgery

## 2020-09-15 ENCOUNTER — Other Ambulatory Visit: Payer: Self-pay

## 2020-09-15 DIAGNOSIS — R29898 Other symptoms and signs involving the musculoskeletal system: Secondary | ICD-10-CM | POA: Insufficient documentation

## 2020-09-15 DIAGNOSIS — M25611 Stiffness of right shoulder, not elsewhere classified: Secondary | ICD-10-CM | POA: Insufficient documentation

## 2020-09-15 NOTE — Patient Instructions (Signed)
Complete the following exercises 2-3 times a day.  Doorway Stretch  Place each hand opposite each other on the doorway. (You can change where you feel the stretch by moving arms higher or lower.) Step through with one foot and bend front knee until a stretch is felt and hold. Step through with the opposite foot on the next rep. Hold for __20-30___ seconds. Repeat __2__times.     Scapular Retraction (Standing)   With arms at sides, pinch shoulder blades together. Repeat __10__ times per set. Do __1__ sets per session. Do __2__ sessions per day.  http://orth.exer.us/944   Copyright  VHI. All rights reserved.   Internal Rotation Across Back  Grab the end of a towel with your affected side, palm facing backwards. Grab the towel with your unaffected side and pull your affected hand across your back until you feel a stretch in the front of your shoulder. If you feel pain, pull just to the pain, do not pull through the pain. Hold. Return your affected arm to your side. Try to keep your hand/arm close to your body during the entire movement.     Hold for 20-30 seconds. Complete 2 times.        Posterior Capsule Stretch   Stand or sit, one arm across body so hand rests over opposite shoulder. Gently push on crossed elbow with other hand until stretch is felt in shoulder of crossed arm. Hold _20-30_ seconds.  Repeat _2__ times per session. Do ___ sessions per day.   Wall Flexion  Slide your arm up the wall or door frame until a stretch is felt in your shoulder . Hold for 20-30 seconds. Complete 2 times     Shoulder Abduction Stretch  Stand side ways by a wall with affected up on wall. Gently step in toward wall to feel stretch. Hold for 20-30 seconds. Complete 2 times.

## 2020-09-16 NOTE — Therapy (Signed)
[facility] [place], [name], [address] Phone: [phone]   Fax:  [phone]  Occupational Therapy Evaluation  Patient Details  Name: Jessica Stevens MRN: [id number] Date of Birth: [birth year] Referring Provider (OT): Tania Ade, MD   Encounter Date: 09/15/2020   OT End of Session - 09/16/20 1527     Visit Number 1    Number of Visits 8    Date for OT Re-Evaluation 10/13/20    Authorization Type [employer] medicare    Authorization Time Period no copay, no authorization, no visit limit    Progress Note Due on Visit 10    OT Start Time 1515    OT Stop Time 1556    OT Time Calculation (min) 41 min    Activity Tolerance Patient tolerated treatment well    Behavior During Therapy [facility] for tasks assessed/performed             Past Medical History:  Diagnosis Date   Allergy    Anemia    Ankle pain, left    Anxiety    Arthritis of knee, degenerative    Asthma    Carpal tunnel syndrome    Cervical radiculopathy    Chronic back pain    Complete rupture of rotator cuff    Complete rupture of rotator cuff    Depression    GERD (gastroesophageal reflux disease)    Gynecomastia    HNP (herniated nucleus pulposus), cervical    HNP (herniated nucleus pulposus), cervical    Hyperlipidemia    Hypertension    Hypothyroidism    Knee pain    OA (osteoarthritis) of knee    Obesity    Sleep apnea    hx of    Urinary incontinence     Past Surgical History:  Procedure Laterality Date   arthroscopy  left knee  08/14/2008   Dr. Aline Brochure    COLONOSCOPY N/A 03/11/2014   Procedure: COLONOSCOPY;  Surgeon: Rogene Houston, MD;  [facility];  Service: Endoscopy;  Laterality: N/A;  730 - moved to 1/13 @ 12:00   COLONOSCOPY     PARTIAL HYSTERECTOMY     POLYPECTOMY     REVERSE SHOULDER ARTHROPLASTY Right 05/27/2020   Procedure: REVERSE SHOULDER ARTHROPLASTY;  Surgeon: Tania Ade, MD;  Location: [facility];   Service: Orthopedics;  Laterality: Right;   tube insert in right ear  02/27/2001    There were no vitals filed for this visit.   Subjective Assessment - 09/15/20 1527     Subjective  S: I have trouble with my strength.    Pertinent History Patient is a 55 y/o female S/P right reverse TSA that was completed on 05/27/20 and is now 16 weeks post op. In 2018, patient underwent a right RTC repair and received therapy services at this clinic. Dr. Tamera Punt has referred patient to occupational therapy for evaluation and treatment.    Patient Stated Goals To increase her strength and be able to use her right arm as normally as possible.    Currently in Pain? Other (Comment)   just sensitivity in the scar from initial surgery.              [facility] OT Assessment - 09/15/20 1529       Assessment   Medical Diagnosis right reverse TSA    Referring Provider (OT) Tania Ade, MD    Onset Date/Surgical Date 05/27/20    Hand Dominance Right  Next MD Visit --   has been released.   Prior Therapy Pt had OT services 3 years ago in 2018 for right shoulder RCR      Precautions   Precautions Fall    Precaution Comments progress as tolerated.      Restrictions   Weight Bearing Restrictions No      Balance Screen   Has the patient fallen in the past 6 months Yes    How many times? 10    Has the patient had a decrease in activity level because of a fear of falling?  No    Is the patient reluctant to leave their home because of a fear of falling?  No      Home  Environment   Family/patient expects to be discharged to: Private residence      Prior Function   Level of Independence Independent    Vocation On disability      ADL   ADL comments Difficulty reach overhead and above shoulder level, lifting household items      Mobility   Mobility Status History of falls      Written Expression   Dominant Hand Right      Vision - History   Baseline Vision Wears glasses all the time       Cognition   Overall Cognitive Status Within Functional Limits for tasks assessed      Observation/Other Assessments   Skin Integrity 4 inch healed incision at anterior region of right shoulder from right RCR in 2018.    Focus on Therapeutic Outcomes (FOTO)  63/100      Posture/Postural Control   Posture/Postural Control No significant limitations      ROM / Strength   AROM / PROM / Strength AROM;PROM;Strength      Palpation   Palpation comment Moderate fascial restrictions palpated in the right upper arm and upper trapezius region.      AROM   Overall AROM Comments Assessed seated. IR/er adducted    AROM Assessment Site Shoulder    Right/Left Shoulder Right    Right Shoulder Flexion 120 Degrees    Right Shoulder ABduction 98 Degrees    Right Shoulder Internal Rotation 90 Degrees    Right Shoulder External Rotation 20 Degrees      PROM   Overall PROM Comments Assessed supine.IR/er adducted.    PROM Assessment Site Shoulder    Right/Left Shoulder Right    Right Shoulder Flexion 130 Degrees    Right Shoulder ABduction 125 Degrees    Right Shoulder Internal Rotation 90 Degrees    Right Shoulder External Rotation 35 Degrees      Strength   Overall Strength Comments Assessed seated/ IR/er adducted    Strength Assessment Site Shoulder    Right/Left Shoulder Right    Right Shoulder Flexion 3-/5    Right Shoulder ABduction 3-/5    Right Shoulder Internal Rotation 5/5    Right Shoulder External Rotation 3-/5                             OT Education - 09/16/20 1526     Education Details shoulder stretches    Person(s) Educated Patient    Methods Explanation;Demonstration;Verbal cues;Handout    Comprehension Verbalized understanding              OT Short Term Goals - 09/16/20 1531       OT SHORT TERM GOAL #1  Title Pt will be provided with HEP to improve RUE use as dominant during daily tasks.     Time 4    Period Weeks    Status New    Target  Date 10/13/20      OT SHORT TERM GOAL #2   Title Pt will decrease RUE fascial restrictions to min amount or less to improve her functional mobility that is needed to complete desired levels of reaching tasks.    Time 4    Period Weeks    Status New      OT SHORT TERM GOAL #3   Title Patient will increase her RUE A/ROM to [facility] in order to reach above shoulder level and behind her back with less difficulty.    Time 4    Period Weeks    Status New      OT SHORT TERM GOAL #4   Title Pt will increase her RUE strength to 4+/5 overall in order to lift household items of moderate weight with less difficulty.    Time 4    Period Weeks    Status New                      Plan - 09/16/20 1528     Clinical Impression Statement A: Patient is a 55 y/o female S/P right reverse TSA causing increased fascial restrictions, and decreased ROM and strength resulting in difficulty completing daily tasks while using her right UE as her dominant extremity.    Occupational performance deficits (Please refer to evaluation for details): ADL's    Body Structure / Function / Physical Skills ADL;UE functional use;Pain;ROM;Strength;Fascial restriction    Rehab Potential Excellent    Clinical Decision Making Limited treatment options, no task modification necessary    Comorbidities Affecting Occupational Performance: May have comorbidities impacting occupational performance    Modification or Assistance to Complete Evaluation  No modification of tasks or assist necessary to complete eval    OT Frequency 2x / week    OT Duration 4 weeks    OT Treatment/Interventions Self-care/ADL training;Ultrasound;DME and/or AE instruction;Passive range of motion;Cryotherapy;Electrical Stimulation;Moist Heat;Neuromuscular education;Therapeutic activities;Manual Therapy;Therapeutic exercise    Plan P: Pt will with benefit from skilled OT services to increase functional performance while using her RUE for daily tasks.  Treatment plan: Myofascial release, manual stretching, A/ROM, shoulder strengthening. Modalities PRN.    OT Home Exercise Plan eval: shoulder stretches    Consulted and Agree with Plan of Care Patient             Patient will benefit from skilled therapeutic intervention in order to improve the following deficits and impairments:   Body Structure / Function / Physical Skills: ADL, UE functional use, Pain, ROM, Strength, Fascial restriction       Visit Diagnosis: Other symptoms and signs involving the musculoskeletal system - Plan: Ot plan of care cert/re-cert  Stiffness of right shoulder, not elsewhere classified - Plan: Ot plan of care cert/re-cert    Problem List Patient Active Problem List   Diagnosis Date Noted   Other bursal cyst, right shoulder 01/16/2018   Primary osteoarthritis of left knee 01/16/2018   Rotator cuff arthropathy, right 01/16/2018   Primary localized osteoarthrosis of right shoulder region 11/22/2016   Status post arthroscopy of right shoulder 11/22/2016   Guaiac positive stools 12/30/2013   GERD 03/25/2010   GUAIAC POSITIVE STOOL 03/25/2010   NECK PAIN, CHRONIC 03/25/2010   HEADACHE 12/30/2009   IMPAIRED FASTING GLUCOSE  12/30/2009   ROM 11/26/2009   TINEA CRURIS 06/29/2009   CANDIDIASIS 06/29/2009   ECZEMA 03/17/2009   CARPAL TUNNEL SYNDROME 12/15/2008   INSOMNIA 09/13/2008   BACK PAIN, LUMBAR, WITH RADICULOPATHY 09/09/2008   HYPERLIPIDEMIA 08/09/2008   FOOT PAIN, BILATERAL 08/04/2008   KNEE, ARTHRITIS, DEGEN./OSTEO 07/29/2008   FATIGUE 05/18/2008   ALLERGIC RHINITIS 10/17/2007   SHOULDER PAIN, BILATERAL 10/17/2007   CERVICAL RADICULOPATHY 10/07/2007   ANKLE PAIN, LEFT 08/19/2007   OBESITY, UNSPECIFIED 06/25/2007   DEPRESSION 06/25/2007   ASTHMA, UNSPECIFIED, UNSPECIFIED STATUS 06/25/2007   GYNECOMASTIA 06/25/2007   URINARY INCONTINENCE 06/25/2007    Ailene Ravel, OTR/L,CBIS  [phone]   09/16/2020, 3:35 PM  [facility] [address] [place], [name], [address] Phone: [phone]   Fax:  [phone]  Name: Jessica Stevens MRN: [id number] Date of Birth: 07/23/1965

## 2020-09-20 ENCOUNTER — Ambulatory Visit (HOSPITAL_COMMUNITY): Payer: Medicare Other | Admitting: Anesthesiology

## 2020-09-20 ENCOUNTER — Encounter (HOSPITAL_COMMUNITY)
Admission: RE | Disposition: A | Discharge status: Home / Self Care | Payer: Self-pay | Source: Home / Self Care | Attending: Gastroenterology

## 2020-09-20 ENCOUNTER — Other Ambulatory Visit: Payer: Self-pay

## 2020-09-20 ENCOUNTER — Encounter (HOSPITAL_COMMUNITY): Payer: Self-pay | Admitting: Gastroenterology

## 2020-09-20 ENCOUNTER — Ambulatory Visit (HOSPITAL_COMMUNITY)
Admission: RE | Admit: 2020-09-20 | Discharge: 2020-09-20 | Disposition: A | Discharge status: Home / Self Care | Payer: Medicare Other | Attending: Gastroenterology | Admitting: Gastroenterology

## 2020-09-20 DIAGNOSIS — K319 Disease of stomach and duodenum, unspecified: Secondary | ICD-10-CM | POA: Diagnosis not present

## 2020-09-20 DIAGNOSIS — J45909 Unspecified asthma, uncomplicated: Secondary | ICD-10-CM | POA: Insufficient documentation

## 2020-09-20 DIAGNOSIS — K219 Gastro-esophageal reflux disease without esophagitis: Secondary | ICD-10-CM | POA: Diagnosis not present

## 2020-09-20 DIAGNOSIS — Z6841 Body Mass Index (BMI) 40.0 and over, adult: Secondary | ICD-10-CM | POA: Diagnosis not present

## 2020-09-20 DIAGNOSIS — Z01818 Encounter for other preprocedural examination: Secondary | ICD-10-CM | POA: Diagnosis not present

## 2020-09-20 DIAGNOSIS — E669 Obesity, unspecified: Secondary | ICD-10-CM

## 2020-09-20 DIAGNOSIS — I10 Essential (primary) hypertension: Secondary | ICD-10-CM | POA: Insufficient documentation

## 2020-09-20 DIAGNOSIS — Z9884 Bariatric surgery status: Secondary | ICD-10-CM | POA: Diagnosis not present

## 2020-09-20 DIAGNOSIS — K3189 Other diseases of stomach and duodenum: Secondary | ICD-10-CM | POA: Diagnosis not present

## 2020-09-20 DIAGNOSIS — E785 Hyperlipidemia, unspecified: Secondary | ICD-10-CM | POA: Diagnosis not present

## 2020-09-20 HISTORY — PX: ESOPHAGOGASTRODUODENOSCOPY (EGD) WITH PROPOFOL: SHX5813

## 2020-09-20 HISTORY — PX: BIOPSY: SHX5522

## 2020-09-20 SURGERY — ESOPHAGOGASTRODUODENOSCOPY (EGD) WITH PROPOFOL
Anesthesia: Monitor Anesthesia Care

## 2020-09-20 MED ORDER — GLYCOPYRROLATE PF 0.2 MG/ML IJ SOSY
PREFILLED_SYRINGE | INTRAMUSCULAR | Status: DC | PRN
  Administered 2020-09-20: .2 mg via INTRAVENOUS

## 2020-09-20 MED ORDER — SODIUM CHLORIDE 0.9 % IV SOLN: INTRAVENOUS | Status: DC

## 2020-09-20 MED ORDER — LACTATED RINGERS IV SOLN: INTRAVENOUS | Status: DC | PRN

## 2020-09-20 MED ORDER — PROPOFOL 10 MG/ML IV BOLUS
INTRAVENOUS | Status: DC | PRN
  Administered 2020-09-20 (×2): 50 mg via INTRAVENOUS

## 2020-09-20 MED ORDER — PROPOFOL 500 MG/50ML IV EMUL
INTRAVENOUS | Status: DC | PRN
  Administered 2020-09-20: 150 ug/kg/min via INTRAVENOUS

## 2020-09-20 MED ORDER — LIDOCAINE 2% (20 MG/ML) 5 ML SYRINGE
INTRAMUSCULAR | Status: DC | PRN
  Administered 2020-09-20: 100 mg via INTRAVENOUS

## 2020-09-20 MED ORDER — PROPOFOL 500 MG/50ML IV EMUL
INTRAVENOUS | Status: AC
  Filled 2020-09-20: qty 50

## 2020-09-20 SURGICAL SUPPLY — 14 items

## 2020-09-20 NOTE — Discharge Instructions (Signed)
YOU HAD AN ENDOSCOPIC PROCEDURE TODAY: Refer to the procedure report and other information in the discharge instructions given to you for any specific questions about what was found during the examination. If this information does not answer your questions, please call [place] office at [phone] to clarify.  ° °YOU SHOULD EXPECT: Some feelings of bloating in the abdomen. Passage of more gas than usual. Walking can help get rid of the air that was put into your GI tract during the procedure and reduce the bloating.  ° °DIET: Your first meal following the procedure should be a light meal and then it is ok to progress to your normal diet. A half-sandwich or bowl of soup is an example of a good first meal. Heavy or fried foods are harder to digest and may make you feel nauseous or bloated. Drink plenty of fluids but you should avoid alcoholic beverages for 24 hours. ° °ACTIVITY: Your care partner should take you home directly after the procedure. You should plan to take it easy, moving slowly for the rest of the day. You can resume normal activity the day after the procedure however YOU SHOULD NOT DRIVE, use power tools, machinery or perform tasks that involve climbing or major physical exertion for 24 hours (because of the sedation medicines used during the test).  ° °SYMPTOMS TO REPORT IMMEDIATELY: °A gastroenterologist can be reached at any hour. Please call [phone]  for any of the following symptoms:  ° °Following upper endoscopy (EGD, EUS, ERCP, esophageal dilation) °Vomiting of blood or coffee ground material  °New, significant abdominal pain  °New, significant chest pain or pain under the shoulder blades  °Painful or persistently difficult swallowing  °New shortness of breath  °Black, tarry-looking or red, bloody stools ° °FOLLOW UP:  °If any biopsies were taken you will be contacted by phone or by letter within the next 1-3 weeks. Call [phone]  if you have not heard about the biopsies in 3 weeks.    °Please also call with any specific questions about appointments or follow up tests. ° °

## 2020-09-20 NOTE — Op Note (Addendum)
[facility] Patient Name: Jessica Stevens Procedure Date: 09/20/2020 MRN: [id number] Attending MD: Starr Lake. Myrtie Neither , MD Date of Birth: Oct 22, 1965 CSN: [id number] Age: 55 Admit Type: Outpatient Procedure:                Upper GI endoscopy Indications:              Esophageal reflux symptoms that persist despite                            appropriate therapy, Preoperative assessment for                            bariatric surgery to treat morbid obesity Providers:                Sherilyn Cooter L. Myrtie Neither, MD, Estella Husk RN, RN, Beryle Beams, Leodis Rains CRNA Referring MD:             Quentin Ore, MD (Surgery) Medicines:                Monitored Anesthesia Care Complications:            No immediate complications. Estimated Blood Loss:     Estimated blood loss was minimal. Procedure:                Pre-Anesthesia Assessment:                           - Prior to the procedure, a History and Physical                            was performed, and patient medications and                            allergies were reviewed. The patient's tolerance of                            previous anesthesia was also reviewed. The risks                            and benefits of the procedure and the sedation                            options and risks were discussed with the patient.                            All questions were answered, and informed consent                            was obtained. Prior Anticoagulants: The patient has                            taken no previous anticoagulant or antiplatelet  agents. ASA Grade Assessment: III - A patient with                            severe systemic disease. After reviewing the risks                            and benefits, the patient was deemed in                            satisfactory condition to undergo the procedure.                           After obtaining  informed consent, the endoscope was                            passed under direct vision. Throughout the                            procedure, the patient's blood pressure, pulse, and                            oxygen saturations were monitored continuously. The                            GIF-H190 ([id number]) Olympus gastroscope was                            introduced through the mouth, and advanced to the                            second part of duodenum. The upper GI endoscopy was                            accomplished without difficulty. The patient                            tolerated the procedure well. Scope In: Scope Out: Findings:      The esophagus was normal.      The entire examined stomach was normal. Biopsies were taken with a cold       forceps for histology (antrum and body).      The cardia and gastric fundus were normal on retroflexion.      The examined duodenum was normal. Impression:               - Normal esophagus.                           - Normal stomach. Biopsied.                           - Normal examined duodenum. Moderate Sedation:      MAC sedation used Recommendation:           - Patient has a contact number available for  emergencies. The signs and symptoms of potential                            delayed complications were discussed with the                            patient. Return to normal activities tomorrow.                            Written discharge instructions were provided to the                            patient.                           - Resume previous diet.                           - Continue present medications.                           - Await pathology results.                           - Return to referring physician. Procedure Code(s):        --- Professional ---                           [phone], Esophagogastroduodenoscopy, flexible,                            transoral; with biopsy, single or  multiple Diagnosis Code(s):        --- Professional ---                           X91.478, Encounter for other preprocedural                            examination                           E66.01, Morbid (severe) obesity due to excess                            calories CPT copyright 2019 American Medical Association. All rights reserved. The codes documented in this report are preliminary and upon coder review may  be revised to meet current compliance requirements. [name] L. Myrtie Neither, MD 09/20/2020 10:30:14 AM This report has been signed electronically. Number of Addenda: 0

## 2020-09-20 NOTE — H&P (Signed)
History:  This patient presents for endoscopic testing for pre-op evaluation in patient considered for bariatric surgery.  Request by surgeon to rule out UGI conditions and Bx for H pylori.  Jessica Stevens Referring physician: Louanna Raw, MD ([facility])  Past Medical History: Past Medical History:  Diagnosis Date   Allergy    Anemia    Ankle pain, left    Anxiety    Arthritis of knee, degenerative    Asthma    Carpal tunnel syndrome    Cervical radiculopathy    Chronic back pain    Complete rupture of rotator cuff    Complete rupture of rotator cuff    Depression    GERD (gastroesophageal reflux disease)    Gynecomastia    HNP (herniated nucleus pulposus), cervical    HNP (herniated nucleus pulposus), cervical    Hyperlipidemia    Hypertension    Hypothyroidism    Knee pain    OA (osteoarthritis) of knee    Obesity    Sleep apnea    hx of    Urinary incontinence      Past Surgical History: Past Surgical History:  Procedure Laterality Date   arthroscopy  left knee  08/14/2008   Dr. Aline Brochure    COLONOSCOPY N/A 03/11/2014   Procedure: COLONOSCOPY;  Surgeon: Rogene Houston, MD;  [facility];  Service: Endoscopy;  Laterality: N/A;  730 - moved to 1/13 @ 12:00   COLONOSCOPY     PARTIAL HYSTERECTOMY     POLYPECTOMY     REVERSE SHOULDER ARTHROPLASTY Right 05/27/2020   Procedure: REVERSE SHOULDER ARTHROPLASTY;  Surgeon: Tania Ade, MD;  Location: [facility];  Service: Orthopedics;  Laterality: Right;   tube insert in right ear  02/27/2001    Allergies: Allergies  Allergen Reactions   Penicillins Other (See Comments)    According to Allergy Test.  Patient states she is not allergic to this medication   Oxycodone-Acetaminophen Rash    Outpatient Meds: Current Facility-Administered Medications  Medication Dose Route Frequency Provider Last Rate Last Admin   0.9 %  sodium chloride infusion   Intravenous Continuous Danis, Estill Cotta III, MD           ___________________________________________________________________ Objective   Exam:  BP 132/63   Pulse 78   Temp 98.2 F (36.8 C) (Oral)   Resp 15   Ht '5\' 1"'$  (1.549 m)   Wt 122.5 kg   SpO2 95%   BMI 51.02 kg/m   CV: RRR without murmur, S1/S2, no JVD, no peripheral edema Resp: clear to auscultation bilaterally, normal RR and effort noted GI: soft, no tenderness, with active bowel sounds. No guarding or palpable organomegaly noted.(Limited by body habitus) Neuro: awake, alert and oriented x 3. Normal gross motor function and fluent speech   Assessment:  Morbid obesity, planned bariatric surgery.  Plan:  EGD with Bx for H pylori  The benefits and risks of the planned procedure were described in detail with the patient or (when appropriate) their health care proxy.  Risks were outlined as including, but not limited to, bleeding, infection, perforation, adverse medication reaction leading to cardiac or pulmonary decompensation, pancreatitis (if ERCP).  The limitation of incomplete mucosal visualization was also discussed.  No guarantees or warranties were given. (Consent reviewed in pre-procedure area)   [name]

## 2020-09-20 NOTE — Transfer of Care (Signed)
Immediate Anesthesia Transfer of Care Note  Patient: Jessica Stevens  Procedure(s) Performed: ESOPHAGOGASTRODUODENOSCOPY (EGD) WITH PROPOFOL BIOPSY  Patient Location: PACU and Endoscopy Unit  Anesthesia Type:MAC  Level of Consciousness: awake, alert  and oriented  Airway & Oxygen Therapy: Patient Spontanous Breathing and Patient connected to face mask  Post-op Assessment: Report given to RN and Post -op Vital signs reviewed and stable  Post vital signs: Reviewed and stable  Last Vitals:  Vitals Value Taken Time  BP 137/95 09/20/20 1030  Temp    Pulse 83 09/20/20 1032  Resp 14 09/20/20 1032  SpO2 100 % 09/20/20 1032  Vitals shown include unvalidated device data.  Last Pain:  Vitals:   09/20/20 0916  TempSrc: Oral  PainSc: 0-No pain         Complications: No notable events documented.

## 2020-09-20 NOTE — Anesthesia Postprocedure Evaluation (Signed)
Anesthesia Post Note  Patient: Jessica Stevens  Procedure(s) Performed: ESOPHAGOGASTRODUODENOSCOPY (EGD) WITH PROPOFOL BIOPSY     Patient location during evaluation: PACU Anesthesia Type: MAC Level of consciousness: awake and alert Pain management: pain level controlled Vital Signs Assessment: post-procedure vital signs reviewed and stable Respiratory status: spontaneous breathing, nonlabored ventilation and respiratory function stable Cardiovascular status: stable and blood pressure returned to baseline Anesthetic complications: no   No notable events documented.  Last Vitals:  Vitals:   09/20/20 1041 09/20/20 1051  BP: 130/79   Pulse: 78 78  Resp: 14 14  Temp:    SpO2: 100% 99%    Last Pain:  Vitals:   09/20/20 1041  TempSrc:   PainSc: 0-No pain                 Audry Pili

## 2020-09-20 NOTE — Anesthesia Preprocedure Evaluation (Addendum)
Anesthesia Evaluation   Patient awake    Reviewed: Allergy & Precautions, NPO status , Patient's Chart, lab work & pertinent test results  History of Anesthesia Complications Negative for: history of anesthetic complications  Airway Mallampati: II  TM Distance: >3 FB Neck ROM: Full    Dental  (+) Dental Advisory Given   Pulmonary asthma , sleep apnea (no longer uses CPAP) , Patient abstained from smoking.,    breath sounds clear to auscultation       Cardiovascular hypertension, Pt. on medications (-) angina Rhythm:Regular Rate:Normal     Neuro/Psych  Headaches, PSYCHIATRIC DISORDERS Anxiety Depression    GI/Hepatic Neg liver ROS, GERD  Controlled and Medicated,(+)     substance abuse  marijuana use,   Endo/Other  Hypothyroidism Morbid obesity  Renal/GU negative Renal ROS  Female GU complaint     Musculoskeletal  (+) Arthritis ,   Abdominal (+) + obese,   Peds  Hematology negative hematology ROS (+)   Anesthesia Other Findings Chronic pain   Reproductive/Obstetrics                            Anesthesia Physical Anesthesia Plan  ASA: 3  Anesthesia Plan: MAC   Post-op Pain Management:    Induction:   PONV Risk Score and Plan: 2 and Propofol infusion and Treatment may vary due to age or medical condition  Airway Management Planned: Nasal Cannula and Natural Airway  Additional Equipment: None  Intra-op Plan:   Post-operative Plan:   Informed Consent: I have reviewed the patients History and Physical, chart, labs and discussed the procedure including the risks, benefits and alternatives for the proposed anesthesia with the patient or authorized representative who has indicated his/her understanding and acceptance.     Dental advisory given  Plan Discussed with: CRNA, Anesthesiologist and Surgeon  Anesthesia Plan Comments:        Anesthesia Quick Evaluation

## 2020-09-20 NOTE — Interval H&P Note (Signed)
History and Physical Interval Note:  09/20/2020 10:13 AM  Jessica Stevens  has presented today for surgery, with the diagnosis of work up for bariatric surgery.  The various methods of treatment have been discussed with the patient and family. After consideration of risks, benefits and other options for treatment, the patient has consented to  Procedure(s): ESOPHAGOGASTRODUODENOSCOPY (EGD) WITH PROPOFOL (N/A) as a surgical intervention.  The patient's history has been reviewed, patient examined, no change in status, stable for surgery.  I have reviewed the patient's chart and labs.  Questions were answered to the patient's satisfaction.     [name]

## 2020-09-21 ENCOUNTER — Encounter (HOSPITAL_COMMUNITY): Payer: Self-pay | Admitting: Gastroenterology

## 2020-09-21 ENCOUNTER — Encounter (HOSPITAL_COMMUNITY): Payer: Medicare Other

## 2020-09-21 LAB — SURGICAL PATHOLOGY

## 2020-09-23 ENCOUNTER — Other Ambulatory Visit: Payer: Self-pay

## 2020-09-23 ENCOUNTER — Encounter: Payer: Medicare Other | Attending: Surgery | Admitting: Skilled Nursing Facility1

## 2020-09-23 DIAGNOSIS — Z6841 Body Mass Index (BMI) 40.0 and over, adult: Secondary | ICD-10-CM | POA: Diagnosis not present

## 2020-09-23 DIAGNOSIS — Z713 Dietary counseling and surveillance: Secondary | ICD-10-CM | POA: Insufficient documentation

## 2020-09-23 DIAGNOSIS — I10 Essential (primary) hypertension: Secondary | ICD-10-CM | POA: Diagnosis not present

## 2020-09-23 DIAGNOSIS — E669 Obesity, unspecified: Secondary | ICD-10-CM | POA: Diagnosis present

## 2020-09-23 NOTE — Progress Notes (Signed)
Supervised Weight Loss Visit Bariatric Nutrition Education  2 out of 6 SWL Appointments    NUTRITION ASSESSMENT  Anthropometrics  Start weight at [facility]: 264 lbs (date: 07/20/2020)  Weight: 265.3 Height: 61 in BMI: 50.13 kg/m2     Clinical  Medical hx: depression, HTN Medications: see list; multivitamin, calcium Labs: Hemoglobin 10.9, HCT 35.6 Notable signs/symptoms: back and knee pain Any previous deficiencies? anemia  Lifestyle & Dietary Hx  Pt states her knee causes a lot of pain which causes anxiety.  Pt states she cannot read and write very well. Pt states she is hard of hearing but hearing aides have helped.    Pt states she was determined to take picture sof her foods but she was determined to eat healthy so through her insurance she got money to buy healthy stuff so has been drinking boost which has been giving her more energy. Pt states she has been moving more latley by cleaning up her house and oragnizing her house.  Pt states she is trying really hard to not eat fried chicken.   Estimated daily fluid intake: unknwon oz Supplements: multivitamin, vitamin D Current average weekly physical activity: ADL's  24-Hr Dietary Recall First Meal: boost  Snack: 2 cups of applesauce  Second Meal 11-12: boost  Snack: peanuts and peanut butter crackers  Third Meal 6:30 pm bologna or spam sandwich or salad: tomato, cheese, [place]  Snack:  Beverages: water, smoothie juice   Estimated Energy Needs Calories: 1500   NUTRITION DIAGNOSIS  Overweight/obesity ([place]-3.3) related to past poor dietary habits and physical inactivity as evidenced by patient w/ planned RYGB surgery following dietary guidelines for continued weight loss.   NUTRITION INTERVENTION  Nutrition counseling (C-1) and education (E-2) to facilitate bariatric surgery goals.  Pre-Op Goals Progress & New Goals Continue: Read your nutrition facts label (accomplished) Continue: Track food and beverage intake pen  and paper (not accomplished) Continue: Consume 3 meals per day or try to eat every 3-5 hours: try yogurt for breakfast (not accomplished) continue: call the number on the back of your insurance card to talk to a mental health professional: states she did not do this but talks to her sister about the lord which helps continue: Order groceries online: rides the cart at the store now continue: take pictures of everything you eat and drink: did not do continue Eat lunch NEW: read your juice label if more than 2g of sugar do not drink it NEW: from lunch to dinner only half 1 pack of crackers or 1/4 cup of nuts  NEW: add a vegetables to dinner 7 days a week  Handouts Provided Include  Meal ideas   Learning Style & Readiness for Change Teaching method utilized: Visual & Auditory  Demonstrated degree of understanding via: Teach Back  Readiness Level: Action Barriers to learning/adherence to lifestyle change: some disabilities   RD's Notes for next Visit  Assess pts adherence to chosen goals   MONITORING & EVALUATION Dietary intake, weekly physical activity, body weight, and pre-op goals in 1 month.   Next Steps  Patient is to return to [facility] for next appt

## 2020-09-24 ENCOUNTER — Ambulatory Visit (HOSPITAL_COMMUNITY): Payer: Medicare Other | Admitting: Occupational Therapy

## 2020-09-24 ENCOUNTER — Encounter: Payer: Medicare Other | Admitting: Gastroenterology

## 2020-09-24 ENCOUNTER — Telehealth (HOSPITAL_COMMUNITY): Payer: Self-pay | Admitting: Occupational Therapy

## 2020-09-24 NOTE — Telephone Encounter (Signed)
Called pt regarding no show for 7/29 at 2:30. Left voicemail reminding pt of next appt on 8/1 at 3:15 and requested pt call if unable to attend this appt.    Guadelupe Sabin, OTR/L  [phone] 09/24/2020

## 2020-09-26 DIAGNOSIS — M1990 Unspecified osteoarthritis, unspecified site: Secondary | ICD-10-CM | POA: Diagnosis not present

## 2020-09-27 ENCOUNTER — Ambulatory Visit (HOSPITAL_COMMUNITY): Payer: Medicare Other

## 2020-09-27 ENCOUNTER — Telehealth (HOSPITAL_COMMUNITY): Payer: Self-pay

## 2020-09-27 NOTE — Telephone Encounter (Signed)
Called patient regarding no show for today. Pt's son had to work.   She has missed 3 appointments at this time (7/26,7/29,8/1). Pt would like to reschedule this missed appointment and she did for Thursday. Informed patient that since she's had 3 no shows we'll only be able to schedule one appointment at a time starting 8/4. Pt understands.  Reminded her of her OT appointments this week on Wednesday and Thursday at 3:15 and to call if she is unable to make them.   Ailene Ravel, OTR/L,CBIS  [phone]

## 2020-09-29 ENCOUNTER — Ambulatory Visit (HOSPITAL_COMMUNITY): Payer: Medicare Other | Attending: Orthopedic Surgery

## 2020-09-29 ENCOUNTER — Other Ambulatory Visit: Payer: Self-pay

## 2020-09-29 DIAGNOSIS — R29898 Other symptoms and signs involving the musculoskeletal system: Secondary | ICD-10-CM | POA: Diagnosis not present

## 2020-09-29 DIAGNOSIS — M25611 Stiffness of right shoulder, not elsewhere classified: Secondary | ICD-10-CM | POA: Insufficient documentation

## 2020-09-29 NOTE — Patient Instructions (Signed)
Repeat all exercises 10-15 times, 1-2 times per day. Ok to use 1lb wrist weights.    1) Shoulder Protraction    Begin with elbows by your side, slowly "punch" straight out in front of you.      2) Shoulder Flexion   Standing:         Begin with arms at your side with thumbs pointed up, slowly raise both arms up and forward towards overhead.      3) Horizontal abduction/adduction    Standing:           Begin with arms straight out in front of you, bring out to the side in at "T" shape. Keep arms straight entire time.      4) Internal & External Rotation   Standing:     Stand with elbows at the side and elbows bent 90 degrees. Move your forearms away from your body, then bring back inward toward the body.     5) Shoulder Abduction  Standing:       Begin with your arms next to your side. Slowly move your arms out to the side so that they go overhead, in a jumping jack or snow angel movement.

## 2020-09-29 NOTE — Therapy (Signed)
[name], [name], [address] Phone: [phone]   Fax:  [phone]  Occupational Therapy Treatment  Patient Details  Name: Jessica Stevens MRN: [id number] Date of Birth: Jan 31, 1966 Referring Provider (OT): Tania Ade, MD   Encounter Date: 09/29/2020   OT End of Session - 09/29/20 1635     Visit Number 2    Number of Visits 8    Date for OT Re-Evaluation 10/13/20    Authorization Type UHC medicare    Authorization Time Period no copay, no authorization, no visit limit    Progress Note Due on Visit 10    OT Start Time 1515    OT Stop Time 1558    OT Time Calculation (min) 43 min    Activity Tolerance Patient tolerated treatment well    Behavior During Therapy [facility] for tasks assessed/performed             Past Medical History:  Diagnosis Date   Allergy    Anemia    Ankle pain, left    Anxiety    Arthritis of knee, degenerative    Asthma    Carpal tunnel syndrome    Cervical radiculopathy    Chronic back pain    Complete rupture of rotator cuff    Complete rupture of rotator cuff    Depression    GERD (gastroesophageal reflux disease)    Gynecomastia    HNP (herniated nucleus pulposus), cervical    HNP (herniated nucleus pulposus), cervical    Hyperlipidemia    Hypertension    Hypothyroidism    Knee pain    OA (osteoarthritis) of knee    Obesity    Sleep apnea    hx of    Urinary incontinence     Past Surgical History:  Procedure Laterality Date   arthroscopy  left knee  08/14/2008   Dr. Aline Brochure    BIOPSY  09/20/2020   Procedure: BIOPSY;  Surgeon: Doran Stabler, MD;  Location: [facility];  Service: Gastroenterology;;   COLONOSCOPY N/A 03/11/2014   Procedure: COLONOSCOPY;  Surgeon: Rogene Houston, MD;  [facility];  Service: Endoscopy;  Laterality: N/A;  730 - moved to 1/13 @ 12:00   COLONOSCOPY     ESOPHAGOGASTRODUODENOSCOPY (EGD) WITH PROPOFOL N/A  09/20/2020   Procedure: ESOPHAGOGASTRODUODENOSCOPY (EGD) WITH PROPOFOL;  Surgeon: Doran Stabler, MD;  Location: [facility];  Service: Gastroenterology;  Laterality: N/A;   PARTIAL HYSTERECTOMY     POLYPECTOMY     REVERSE SHOULDER ARTHROPLASTY Right 05/27/2020   Procedure: REVERSE SHOULDER ARTHROPLASTY;  Surgeon: Tania Ade, MD;  Location: [facility];  Service: Orthopedics;  Laterality: Right;   tube insert in right ear  02/27/2001    There were no vitals filed for this visit.   Subjective Assessment - 09/29/20 1533     Subjective  S: I like the stretch where I go up the wall and hold it.    Currently in Pain? No/denies                [facility] OT Assessment - 09/29/20 1534       Assessment   Medical Diagnosis right reverse TSA      Precautions   Precautions Fall    Precaution Comments progress as tolerated.                      OT Treatments/Exercises (OP) - 09/29/20 1534  Exercises   Exercises Shoulder      Shoulder Exercises: Supine   Protraction PROM;AROM;Strengthening;10 reps    Protraction Weight (lbs) 1    Horizontal ABduction PROM;AROM;Strengthening;10 reps    Horizontal ABduction Weight (lbs) 1    External Rotation PROM;AROM;Strengthening;10 reps    External Rotation Weight (lbs) 1    Internal Rotation PROM;AROM;Strengthening;10 reps    Internal Rotation Weight (lbs) 1    Flexion PROM;AROM;Strengthening;10 reps    Shoulder Flexion Weight (lbs) 1    ABduction PROM;AROM;Strengthening;10 reps    Shoulder ABduction Weight (lbs) 1      Shoulder Exercises: Standing   Protraction Strengthening;10 reps    Protraction Weight (lbs) 1    Horizontal ABduction Strengthening;10 reps    Horizontal ABduction Weight (lbs) 1    External Rotation Strengthening;10 reps    External Rotation Weight (lbs) 1    Internal Rotation Strengthening;10 reps    Internal Rotation Weight (lbs) 1    Flexion Strengthening;10 reps    Shoulder Flexion Weight  (lbs) 1    ABduction AROM;10 reps      Shoulder Exercises: ROM/Strengthening   UBE (Upper Arm Bike) Level 1 2' forward 2' reverse   pace: 3.0-3.5   Over Head Lace Seated; started at top of chain and laced to the bottom    "W" Arms 10X A/ROM                    OT Education - 09/29/20 1532     Education Details reviewed therapy goals. Provided TENS unit purchase information. Strengthening shoulder exercises added to HEP. Pt to use personal 1lb wrist weights.    Person(s) Educated Patient    Methods Explanation;Handout;Demonstration    Comprehension Verbalized understanding;Returned demonstration              OT Short Term Goals - 09/29/20 1635       OT SHORT TERM GOAL #1   Title Pt will be provided with HEP to improve RUE use as dominant during daily tasks.     Time 4    Period Weeks    Status On-going    Target Date 10/13/20      OT SHORT TERM GOAL #2   Title Pt will decrease RUE fascial restrictions to min amount or less to improve her functional mobility that is needed to complete desired levels of reaching tasks.    Time 4    Period Weeks    Status On-going      OT SHORT TERM GOAL #3   Title Patient will increase her RUE A/ROM to [facility] in order to reach above shoulder level and behind her back with less difficulty.    Time 4    Period Weeks    Status On-going      OT SHORT TERM GOAL #4   Title Pt will increase her RUE strength to 4+/5 overall in order to lift household items of moderate weight with less difficulty.    Time 4    Period Weeks    Status On-going                      Plan - 09/29/20 1636     Clinical Impression Statement A: Initiated myofascial release to right upper arm and upper trapezius. patient with minimal fascial restrictions palpated this date. Able to tolerate close to full passive ROM. Completed both active ROM and strengthening exercises supine before progressing to standing strengthening. HEP was updated with  strengthening exercises. VC for form and technique were provided.    OT Occupational Profile and History Problem Focused Assessment - Including review of records relating to presenting problem    Body Structure / Function / Physical Skills ADL;UE functional use;Pain;ROM;Strength;Fascial restriction    Plan P: Completed overhead lacing by lacing to the bottom and unlacing while seated.    OT Home Exercise Plan eval: shoulder stretches 8/3: strengthening shoulder exercises.    Consulted and Agree with Plan of Care Patient             Patient will benefit from skilled therapeutic intervention in order to improve the following deficits and impairments:   Body Structure / Function / Physical Skills: ADL, UE functional use, Pain, ROM, Strength, Fascial restriction       Visit Diagnosis: Stiffness of right shoulder, not elsewhere classified  Other symptoms and signs involving the musculoskeletal system    Problem List Patient Active Problem List   Diagnosis Date Noted   Other bursal cyst, right shoulder 01/16/2018   Primary osteoarthritis of left knee 01/16/2018   Rotator cuff arthropathy, right 01/16/2018   Primary localized osteoarthrosis of right shoulder region 11/22/2016   Status post arthroscopy of right shoulder 11/22/2016   Guaiac positive stools 12/30/2013   GERD 03/25/2010   GUAIAC POSITIVE STOOL 03/25/2010   NECK PAIN, CHRONIC 03/25/2010   HEADACHE 12/30/2009   IMPAIRED FASTING GLUCOSE 12/30/2009   ROM 11/26/2009   TINEA CRURIS 06/29/2009   CANDIDIASIS 06/29/2009   ECZEMA 03/17/2009   CARPAL TUNNEL SYNDROME 12/15/2008   INSOMNIA 09/13/2008   BACK PAIN, LUMBAR, WITH RADICULOPATHY 09/09/2008   HYPERLIPIDEMIA 08/09/2008   FOOT PAIN, BILATERAL 08/04/2008   KNEE, ARTHRITIS, DEGEN./OSTEO 07/29/2008   FATIGUE 05/18/2008   ALLERGIC RHINITIS 10/17/2007   SHOULDER PAIN, BILATERAL 10/17/2007   CERVICAL RADICULOPATHY 10/07/2007   ANKLE PAIN, LEFT 08/19/2007   OBESITY,  UNSPECIFIED 06/25/2007   DEPRESSION 06/25/2007   ASTHMA, UNSPECIFIED, UNSPECIFIED STATUS 06/25/2007   GYNECOMASTIA 06/25/2007   URINARY INCONTINENCE 06/25/2007    Ailene Ravel, OTR/L,CBIS  [phone]  09/29/2020, 4:50 PM  [facility] [address] [place], [name], [address] Phone: [phone]   Fax:  [phone]  Name: YARELLI WERKMAN MRN: [id number] Date of Birth: 09/14/1965

## 2020-09-30 ENCOUNTER — Ambulatory Visit (HOSPITAL_COMMUNITY): Payer: Medicare Other

## 2020-09-30 ENCOUNTER — Encounter (HOSPITAL_COMMUNITY): Payer: Self-pay

## 2020-09-30 DIAGNOSIS — M25611 Stiffness of right shoulder, not elsewhere classified: Secondary | ICD-10-CM

## 2020-09-30 DIAGNOSIS — R29898 Other symptoms and signs involving the musculoskeletal system: Secondary | ICD-10-CM | POA: Diagnosis not present

## 2020-09-30 NOTE — Therapy (Signed)
[place], [name], [address] Phone: [phone]   Fax:  [phone]  Occupational Therapy Treatment  Patient Details  Name: Jessica Stevens MRN: [id number] Date of Birth: 1965-10-27 Referring Provider (OT): Tania Ade, MD   Encounter Date: 09/30/2020   OT End of Session - 09/30/20 1613     Visit Number 3    Number of Visits 8    Date for OT Re-Evaluation 10/13/20    Authorization Type [employer] medicare    Authorization Time Period no copay, no authorization, no visit limit    Progress Note Due on Visit 10    OT Start Time 1515    OT Stop Time 1553    OT Time Calculation (min) 38 min    Activity Tolerance Patient tolerated treatment well    Behavior During Therapy [facility] for tasks assessed/performed             Past Medical History:  Diagnosis Date   Allergy    Anemia    Ankle pain, left    Anxiety    Arthritis of knee, degenerative    Asthma    Carpal tunnel syndrome    Cervical radiculopathy    Chronic back pain    Complete rupture of rotator cuff    Complete rupture of rotator cuff    Depression    GERD (gastroesophageal reflux disease)    Gynecomastia    HNP (herniated nucleus pulposus), cervical    HNP (herniated nucleus pulposus), cervical    Hyperlipidemia    Hypertension    Hypothyroidism    Knee pain    OA (osteoarthritis) of knee    Obesity    Sleep apnea    hx of    Urinary incontinence     Past Surgical History:  Procedure Laterality Date   arthroscopy  left knee  08/14/2008   Dr. Aline Brochure    BIOPSY  09/20/2020   Procedure: BIOPSY;  Surgeon: Doran Stabler, MD;  Location: [facility];  Service: Gastroenterology;;   COLONOSCOPY N/A 03/11/2014   Procedure: COLONOSCOPY;  Surgeon: Rogene Houston, MD;  [facility];  Service: Endoscopy;  Laterality: N/A;  730 - moved to 1/13 @ 12:00   COLONOSCOPY     ESOPHAGOGASTRODUODENOSCOPY (EGD) WITH PROPOFOL N/A  09/20/2020   Procedure: ESOPHAGOGASTRODUODENOSCOPY (EGD) WITH PROPOFOL;  Surgeon: Doran Stabler, MD;  Location: [facility];  Service: Gastroenterology;  Laterality: N/A;   PARTIAL HYSTERECTOMY     POLYPECTOMY     REVERSE SHOULDER ARTHROPLASTY Right 05/27/2020   Procedure: REVERSE SHOULDER ARTHROPLASTY;  Surgeon: Tania Ade, MD;  Location: [facility];  Service: Orthopedics;  Laterality: Right;   tube insert in right ear  02/27/2001    There were no vitals filed for this visit.   Subjective Assessment - 09/30/20 1539     Subjective  S: I was able to do my exercises earlier today.    Currently in Pain? No/denies                [facility] OT Assessment - 09/30/20 1540       Assessment   Medical Diagnosis right reverse TSA      Precautions   Precautions Fall    Precaution Comments progress as tolerated.                      OT Treatments/Exercises (OP) - 09/30/20 1540       Exercises  Exercises Shoulder      Shoulder Exercises: Supine   Protraction PROM;5 reps    External Rotation PROM;5 reps    Internal Rotation PROM;5 reps    Flexion PROM;5 reps    ABduction PROM;5 reps      Shoulder Exercises: Standing   Protraction Strengthening;10 reps    Protraction Weight (lbs) 1    Flexion AROM;12 reps    ABduction AROM;10 reps      Shoulder Exercises: ROM/Strengthening   UBE (Upper Arm Bike) Level 1 2' forward 2' reverse   pace: 3.0-4.0   Over Head Lace seated. Laced from top of chain to bottom. Removed tubing from bottom to top with several rest breaks taken.    Other ROM/Strengthening Exercises standing ball pass behind back to focus on internal rotation (min difficulty with [place] ball) ball pass behind head to focus on external rotation (mod-max difficulty) 10X each.      Shoulder Exercises: Stretch   Internal Rotation Stretch 2 reps   20" with towel vertical     Manual Therapy   Manual Therapy Myofascial release    Manual therapy comments manual  therapy completed seperately from all other interventions this date of service    Myofascial Release Myofascial release and manual stretching completed to right upper arm and upper trapezius region to decrease fascial restrictions and increase joint mobility in a pain free zone.   Massage cream used since patient was wearing a tank top.                   OT Education - 09/30/20 1612     Education Details internal rotation stretch with towel    Person(s) Educated Patient    Methods Explanation;Handout;Demonstration;Verbal cues    Comprehension Verbalized understanding;Returned demonstration              OT Short Term Goals - 09/29/20 1635       OT SHORT TERM GOAL #1   Title Pt will be provided with HEP to improve RUE use as dominant during daily tasks.     Time 4    Period Weeks    Status On-going    Target Date 10/13/20      OT SHORT TERM GOAL #2   Title Pt will decrease RUE fascial restrictions to min amount or less to improve her functional mobility that is needed to complete desired levels of reaching tasks.    Time 4    Period Weeks    Status On-going      OT SHORT TERM GOAL #3   Title Patient will increase her RUE A/ROM to [facility] in order to reach above shoulder level and behind her back with less difficulty.    Time 4    Period Weeks    Status On-going      OT SHORT TERM GOAL #4   Title Pt will increase her RUE strength to 4+/5 overall in order to lift household items of moderate weight with less difficulty.    Time 4    Period Weeks    Status On-going                      Plan - 09/30/20 1613     Clinical Impression Statement A: Manual techniques completed to address min-mod fascial restrictions in the right upper arm and upper trapezius. Demonstrates full or close to full P/ROM for all shoulder ranges except external rotation this session. Did not utilize the 1lb wrist weight  during session due to muscle fatigue. Completed overhead lacing  immediately after passive stretching to better performance. pt was able to complete entire task seated although required multiple rest breaks to complete. VC for form and technique were provided during session.    Body Structure / Function / Physical Skills ADL;UE functional use;Pain;ROM;Strength;Fascial restriction    Plan P: Resume standing strengthening with one pound weight if able to tolerate with good form and technique; otherwise complete standing A/ROM. Add proximal shoulder strengthening standing.    OT Home Exercise Plan eval: shoulder stretches 8/3: strengthening shoulder exercises. 8/4: interal rotation stretch    Consulted and Agree with Plan of Care Patient             Patient will benefit from skilled therapeutic intervention in order to improve the following deficits and impairments:   Body Structure / Function / Physical Skills: ADL, UE functional use, Pain, ROM, Strength, Fascial restriction       Visit Diagnosis: Stiffness of right shoulder, not elsewhere classified  Other symptoms and signs involving the musculoskeletal system    Problem List Patient Active Problem List   Diagnosis Date Noted   Other bursal cyst, right shoulder 01/16/2018   Primary osteoarthritis of left knee 01/16/2018   Rotator cuff arthropathy, right 01/16/2018   Primary localized osteoarthrosis of right shoulder region 11/22/2016   Status post arthroscopy of right shoulder 11/22/2016   Guaiac positive stools 12/30/2013   GERD 03/25/2010   GUAIAC POSITIVE STOOL 03/25/2010   NECK PAIN, CHRONIC 03/25/2010   HEADACHE 12/30/2009   IMPAIRED FASTING GLUCOSE 12/30/2009   ROM 11/26/2009   TINEA CRURIS 06/29/2009   CANDIDIASIS 06/29/2009   ECZEMA 03/17/2009   CARPAL TUNNEL SYNDROME 12/15/2008   INSOMNIA 09/13/2008   BACK PAIN, LUMBAR, WITH RADICULOPATHY 09/09/2008   HYPERLIPIDEMIA 08/09/2008   FOOT PAIN, BILATERAL 08/04/2008   KNEE, ARTHRITIS, DEGEN./OSTEO 07/29/2008   FATIGUE 05/18/2008    ALLERGIC RHINITIS 10/17/2007   SHOULDER PAIN, BILATERAL 10/17/2007   CERVICAL RADICULOPATHY 10/07/2007   ANKLE PAIN, LEFT 08/19/2007   OBESITY, UNSPECIFIED 06/25/2007   DEPRESSION 06/25/2007   ASTHMA, UNSPECIFIED, UNSPECIFIED STATUS 06/25/2007   GYNECOMASTIA 06/25/2007   URINARY INCONTINENCE 06/25/2007    Ailene Ravel, OTR/L,CBIS  [phone]  09/30/2020, 4:21 PM  [facility] [address] [place], [name], [address] Phone: [phone]   Fax:  [phone]  Name: TERRILL TROMPETER MRN: [id number] Date of Birth: 1965-09-04

## 2020-09-30 NOTE — Patient Instructions (Signed)
INTERNAL ROTATION TOWEL STRETCH - IR TOWEL  Gently pull up your affected arm behind your back with the assist of a towel. Hold this as a stretch, then lower back down and repeat.  Hold for 20-30 Seconds. Complete 2 times.

## 2020-10-04 ENCOUNTER — Encounter (HOSPITAL_COMMUNITY): Payer: Self-pay | Admitting: Occupational Therapy

## 2020-10-04 ENCOUNTER — Ambulatory Visit (HOSPITAL_COMMUNITY): Payer: Medicare Other | Admitting: Occupational Therapy

## 2020-10-04 ENCOUNTER — Ambulatory Visit (HOSPITAL_COMMUNITY): Payer: Medicare Other

## 2020-10-04 ENCOUNTER — Other Ambulatory Visit: Payer: Self-pay

## 2020-10-04 DIAGNOSIS — R29898 Other symptoms and signs involving the musculoskeletal system: Secondary | ICD-10-CM

## 2020-10-04 DIAGNOSIS — M25611 Stiffness of right shoulder, not elsewhere classified: Secondary | ICD-10-CM | POA: Diagnosis not present

## 2020-10-04 NOTE — Therapy (Signed)
[place], [name], [address] Phone: [phone]   Fax:  [phone]  Occupational Therapy Treatment  Patient Details  Name: Jessica Stevens MRN: [id number] Date of Birth: [birth year] Referring Provider (OT): Tania Ade, MD   Encounter Date: 10/04/2020   OT End of Session - 10/04/20 1523     Visit Number 4    Number of Visits 8    Date for OT Re-Evaluation 10/13/20    Authorization Type [employer] medicare    Authorization Time Period no copay, no authorization, no visit limit    Progress Note Due on Visit 10    OT Start Time 1434    OT Stop Time 1514    OT Time Calculation (min) 40 min    Activity Tolerance Patient tolerated treatment well    Behavior During Therapy [facility] for tasks assessed/performed             Past Medical History:  Diagnosis Date   Allergy    Anemia    Ankle pain, left    Anxiety    Arthritis of knee, degenerative    Asthma    Carpal tunnel syndrome    Cervical radiculopathy    Chronic back pain    Complete rupture of rotator cuff    Complete rupture of rotator cuff    Depression    GERD (gastroesophageal reflux disease)    Gynecomastia    HNP (herniated nucleus pulposus), cervical    HNP (herniated nucleus pulposus), cervical    Hyperlipidemia    Hypertension    Hypothyroidism    Knee pain    OA (osteoarthritis) of knee    Obesity    Sleep apnea    hx of    Urinary incontinence     Past Surgical History:  Procedure Laterality Date   arthroscopy  left knee  08/14/2008   Dr. Aline Brochure    BIOPSY  09/20/2020   Procedure: BIOPSY;  Surgeon: Doran Stabler, MD;  Location: [facility];  Service: Gastroenterology;;   COLONOSCOPY N/A 03/11/2014   Procedure: COLONOSCOPY;  Surgeon: Rogene Houston, MD;  [facility];  Service: Endoscopy;  Laterality: N/A;  730 - moved to 1/13 @ 12:00   COLONOSCOPY     ESOPHAGOGASTRODUODENOSCOPY (EGD) WITH PROPOFOL N/A  09/20/2020   Procedure: ESOPHAGOGASTRODUODENOSCOPY (EGD) WITH PROPOFOL;  Surgeon: Doran Stabler, MD;  Location: [facility];  Service: Gastroenterology;  Laterality: N/A;   PARTIAL HYSTERECTOMY     POLYPECTOMY     REVERSE SHOULDER ARTHROPLASTY Right 05/27/2020   Procedure: REVERSE SHOULDER ARTHROPLASTY;  Surgeon: Tania Ade, MD;  Location: [facility];  Service: Orthopedics;  Laterality: Right;   tube insert in right ear  02/27/2001    There were no vitals filed for this visit.   Subjective Assessment - 10/04/20 1434     Subjective  S: It doesn't hurt.    Currently in Pain? No/denies                [facility] OT Assessment - 10/04/20 0001       Assessment   Medical Diagnosis right reverse TSA      Precautions   Precautions Fall    Precaution Comments progress as tolerated.                      OT Treatments/Exercises (OP) - 10/04/20 0001       Exercises   Exercises Shoulder  Shoulder Exercises: Supine   Protraction PROM;5 reps    Horizontal ABduction PROM;5 reps    External Rotation PROM;5 reps    Internal Rotation PROM;5 reps    Flexion PROM;5 reps    ABduction PROM;5 reps      Shoulder Exercises: Standing   Protraction Strengthening;10 reps    Protraction Weight (lbs) 1    Horizontal ABduction Strengthening;10 reps    Horizontal ABduction Weight (lbs) 1    External Rotation Strengthening;10 reps    External Rotation Weight (lbs) 1    Internal Rotation Strengthening;10 reps    Flexion Strengthening;10 reps   Rest breaks and assist needed through last 1/4 of ROM.   Shoulder Flexion Weight (lbs) 1    ABduction 10 reps;Strengthening   Rest breaks and assist needed through last 1/4 of ROM.   Shoulder ABduction Weight (lbs) 1      Shoulder Exercises: ROM/Strengthening   UBE (Upper Arm Bike) Level 1 3' forward 3' reverse   3.0 to 4.0 pace   Over Head Lace seated. Laced from top of chain to bottom.    Other ROM/Strengthening Exercises  standing proximal shoulder girdle strengthening via paddles, criss cross, and circles x10 A/ROM and x10 holding 1# dumbbell.    Other ROM/Strengthening Exercises Placing squigz overhead on door; 1# wrist weight used when removing squigz one at a time.      Manual Therapy   Manual Therapy Myofascial release    Manual therapy comments manual therapy completed seperately from all other interventions this date of service    Myofascial Release Myofascial release and manual stretching completed to right upper arm and upper trapezius region to decrease fascial restrictions and increase joint mobility in a pain free zone.                      OT Short Term Goals - 09/29/20 1635       OT SHORT TERM GOAL #1   Title Pt will be provided with HEP to improve RUE use as dominant during daily tasks.     Time 4    Period Weeks    Status On-going    Target Date 10/13/20      OT SHORT TERM GOAL #2   Title Pt will decrease RUE fascial restrictions to min amount or less to improve her functional mobility that is needed to complete desired levels of reaching tasks.    Time 4    Period Weeks    Status On-going      OT SHORT TERM GOAL #3   Title Patient will increase her RUE A/ROM to [facility] in order to reach above shoulder level and behind her back with less difficulty.    Time 4    Period Weeks    Status On-going      OT SHORT TERM GOAL #4   Title Pt will increase her RUE strength to 4+/5 overall in order to lift household items of moderate weight with less difficulty.    Time 4    Period Weeks    Status On-going                      Plan - 10/04/20 1528     Clinical Impression Statement A: Manual techniques completed to address min-mod fascial restrictions in the R upper arm and upper trapezius. Pt demonstrates close to full P/ROM in supine for shoulder ranges with the continued limitation of ER less than full ROM but  not significantly limited. Pt utilized 1# dumbbell for  strengthening shoulder in all ranges. Pt required additional physical assist ot achieve last ~25% of range for flexion and abduction. Pt able to complete placement of squigz at overhead level and removal donning 1# wrist weight. VC for form and technique.    Body Structure / Function / Physical Skills ADL;UE functional use;Pain;ROM;Strength;Fascial restriction    OT Treatment/Interventions Self-care/ADL training;Ultrasound;DME and/or AE instruction;Passive range of motion;Cryotherapy;Electrical Stimulation;Moist Heat;Neuromuscular education;Therapeutic activities;Manual Therapy;Therapeutic exercise    Plan P: Contine standing strengthening with possible assist or grading for flexion and abduction. Continue proximal shoulder strengthening in standing and overhad repetitive strengthening tasks.    OT Home Exercise Plan eval: shoulder stretches 8/3: strengthening shoulder exercises. 8/4: interal rotation stretch    Consulted and Agree with Plan of Care Patient             Patient will benefit from skilled therapeutic intervention in order to improve the following deficits and impairments:   Body Structure / Function / Physical Skills: ADL, UE functional use, Pain, ROM, Strength, Fascial restriction       Visit Diagnosis: Stiffness of right shoulder, not elsewhere classified  Other symptoms and signs involving the musculoskeletal system    Problem List Patient Active Problem List   Diagnosis Date Noted   Other bursal cyst, right shoulder 01/16/2018   Primary osteoarthritis of left knee 01/16/2018   Rotator cuff arthropathy, right 01/16/2018   Primary localized osteoarthrosis of right shoulder region 11/22/2016   Status post arthroscopy of right shoulder 11/22/2016   Guaiac positive stools 12/30/2013   GERD 03/25/2010   GUAIAC POSITIVE STOOL 03/25/2010   NECK PAIN, CHRONIC 03/25/2010   HEADACHE 12/30/2009   IMPAIRED FASTING GLUCOSE 12/30/2009   ROM 11/26/2009   TINEA CRURIS  06/29/2009   CANDIDIASIS 06/29/2009   ECZEMA 03/17/2009   CARPAL TUNNEL SYNDROME 12/15/2008   INSOMNIA 09/13/2008   BACK PAIN, LUMBAR, WITH RADICULOPATHY 09/09/2008   HYPERLIPIDEMIA 08/09/2008   FOOT PAIN, BILATERAL 08/04/2008   KNEE, ARTHRITIS, DEGEN./OSTEO 07/29/2008   FATIGUE 05/18/2008   ALLERGIC RHINITIS 10/17/2007   SHOULDER PAIN, BILATERAL 10/17/2007   CERVICAL RADICULOPATHY 10/07/2007   ANKLE PAIN, LEFT 08/19/2007   OBESITY, UNSPECIFIED 06/25/2007   DEPRESSION 06/25/2007   ASTHMA, UNSPECIFIED, UNSPECIFIED STATUS 06/25/2007   GYNECOMASTIA 06/25/2007   URINARY INCONTINENCE 06/25/2007   Larey Seat OT, MOT  Larey Seat 10/04/2020, 3:33 PM  [place] [address] [place], [name], [address] Phone: [phone]   Fax:  [phone]  Name: Jessica Stevens MRN: [id number] Date of Birth: 1965-03-05

## 2020-10-06 ENCOUNTER — Encounter (HOSPITAL_COMMUNITY): Payer: Medicare Other

## 2020-10-07 ENCOUNTER — Ambulatory Visit (HOSPITAL_COMMUNITY): Payer: Medicare Other

## 2020-10-07 ENCOUNTER — Other Ambulatory Visit: Payer: Self-pay

## 2020-10-07 ENCOUNTER — Encounter (HOSPITAL_COMMUNITY): Payer: Self-pay

## 2020-10-07 DIAGNOSIS — R29898 Other symptoms and signs involving the musculoskeletal system: Secondary | ICD-10-CM

## 2020-10-07 DIAGNOSIS — M25611 Stiffness of right shoulder, not elsewhere classified: Secondary | ICD-10-CM

## 2020-10-07 NOTE — Patient Instructions (Signed)
1) (Home) Extension: Isometric / Bilateral Arm Retraction - Sitting   Facing anchor, hold hands and elbow at shoulder height, with elbow bent.  Pull arms back to squeeze shoulder blades together. Repeat 10-15 times. 1-2 times/day.   2) (Clinic) Extension / Flexion (Assist)   Face anchor, pull arms back, keeping elbow straight, and squeze shoulder blades together. Repeat 10-15 times. 1-2 times/day.   Copyright  VHI. All rights reserved.   3) (Home) Retraction: Row - Bilateral (Anchor)   Facing anchor, arms reaching forward, pull hands toward stomach, keeping elbows bent and at your sides and pinching shoulder blades together. Repeat 10-15 times. 1-2 times/day.   Copyright  VHI. All rights reserved.   

## 2020-10-07 NOTE — Therapy (Signed)
[place], [name], [address] Phone: [phone]   Fax:  [phone]  Occupational Therapy Treatment  Patient Details  Name: Jessica Stevens MRN: [id number] Date of Birth: 1965-06-02 Referring Provider (OT): Tania Ade, MD   Encounter Date: 10/07/2020   OT End of Session - 10/07/20 1533     Visit Number 5    Number of Visits 8    Date for OT Re-Evaluation 10/13/20    Authorization Type [employer] medicare    Authorization Time Period no copay, no authorization, no visit limit    Progress Note Due on Visit 10    OT Start Time 1115    OT Stop Time 1153    OT Time Calculation (min) 38 min    Activity Tolerance Patient tolerated treatment well    Behavior During Therapy [facility] for tasks assessed/performed             Past Medical History:  Diagnosis Date   Allergy    Anemia    Ankle pain, left    Anxiety    Arthritis of knee, degenerative    Asthma    Carpal tunnel syndrome    Cervical radiculopathy    Chronic back pain    Complete rupture of rotator cuff    Complete rupture of rotator cuff    Depression    GERD (gastroesophageal reflux disease)    Gynecomastia    HNP (herniated nucleus pulposus), cervical    HNP (herniated nucleus pulposus), cervical    Hyperlipidemia    Hypertension    Hypothyroidism    Knee pain    OA (osteoarthritis) of knee    Obesity    Sleep apnea    hx of    Urinary incontinence     Past Surgical History:  Procedure Laterality Date   arthroscopy  left knee  08/14/2008   Dr. Aline Brochure    BIOPSY  09/20/2020   Procedure: BIOPSY;  Surgeon: Doran Stabler, MD;  Location: [facility];  Service: Gastroenterology;;   COLONOSCOPY N/A 03/11/2014   Procedure: COLONOSCOPY;  Surgeon: Rogene Houston, MD;  [facility];  Service: Endoscopy;  Laterality: N/A;  730 - moved to 1/13 @ 12:00   COLONOSCOPY     ESOPHAGOGASTRODUODENOSCOPY (EGD) WITH PROPOFOL N/A  09/20/2020   Procedure: ESOPHAGOGASTRODUODENOSCOPY (EGD) WITH PROPOFOL;  Surgeon: Doran Stabler, MD;  [facility];  Service: Gastroenterology;  Laterality: N/A;   PARTIAL HYSTERECTOMY     POLYPECTOMY     REVERSE SHOULDER ARTHROPLASTY Right 05/27/2020   Procedure: REVERSE SHOULDER ARTHROPLASTY;  Surgeon: Tania Ade, MD;  Location: [facility];  Service: Orthopedics;  Laterality: Right;   tube insert in right ear  02/27/2001    There were no vitals filed for this visit.   Subjective Assessment - 10/07/20 1132     Subjective  S: I wish I could reach higher.    Currently in Pain? No/denies                [facility] OT Assessment - 10/07/20 1148       Assessment   Medical Diagnosis right reverse TSA      Precautions   Precautions Fall    Precaution Comments progress as tolerated.                      OT Treatments/Exercises (OP) - 10/07/20 1135       Exercises   Exercises  Shoulder      Shoulder Exercises: Supine   Protraction PROM;5 reps    Horizontal ABduction PROM;5 reps    External Rotation PROM;5 reps    Internal Rotation PROM;5 reps    Flexion PROM;5 reps    ABduction PROM;5 reps      Shoulder Exercises: Standing   Protraction Strengthening;10 reps    Protraction Weight (lbs) 1    Horizontal ABduction Strengthening;10 reps    Horizontal ABduction Weight (lbs) 1    External Rotation Strengthening;10 reps    External Rotation Weight (lbs) 1    Internal Rotation Strengthening;10 reps    Internal Rotation Weight (lbs) 1    Flexion Strengthening;10 reps    Shoulder Flexion Weight (lbs) 1    ABduction Strengthening;10 reps    Shoulder ABduction Weight (lbs) 1    Extension Theraband;10 reps    Theraband Level (Shoulder Extension) Level 2 (Red)    Row Theraband;10 reps    Theraband Level (Shoulder Row) Level 2 (Red)    Retraction Theraband;10 reps    Theraband Level (Shoulder Retraction) Level 2 (Red)      Shoulder Exercises:  ROM/Strengthening   Over Head Lace seated. Laced chain fromtop to bottom then unlaced to the top to remove.    X to V Arms 10X A/ROM    Proximal Shoulder Strengthening, Seated 10X 1# no rest breaks      Manual Therapy   Manual Therapy Myofascial release    Manual therapy comments manual therapy completed seperately from all other interventions this date of service    Myofascial Release Myofascial release and manual stretching completed to right upper arm and upper trapezius region to decrease fascial restrictions and increase joint mobility in a pain free zone.                    OT Education - 10/07/20 1147     Education Details Scapular strengthening - red band (patient reports she has one at home.)    Person(s) Educated Patient    Methods Explanation;Handout;Demonstration    Comprehension Verbalized understanding;Returned demonstration              OT Short Term Goals - 09/29/20 1635       OT SHORT TERM GOAL #1   Title Pt will be provided with HEP to improve RUE use as dominant during daily tasks.     Time 4    Period Weeks    Status On-going    Target Date 10/13/20      OT SHORT TERM GOAL #2   Title Pt will decrease RUE fascial restrictions to min amount or less to improve her functional mobility that is needed to complete desired levels of reaching tasks.    Time 4    Period Weeks    Status On-going      OT SHORT TERM GOAL #3   Title Patient will increase her RUE A/ROM to [facility] in order to reach above shoulder level and behind her back with less difficulty.    Time 4    Period Weeks    Status On-going      OT SHORT TERM GOAL #4   Title Pt will increase her RUE strength to 4+/5 overall in order to lift household items of moderate weight with less difficulty.    Time 4    Period Weeks    Status On-going  Plan - 10/07/20 1534     Clinical Impression Statement A: manual techniques completed to address mod fascial  restrictions in the right upper arm and upper trapezius region. Able to complete all standing strengthening exercises using 1# without added assistance. Completed scapular strengthening using red band and added to HEP. Pt reports that she has a red resistive band from previous therapy sessions. VC for form and technique were provided during session. Max difficulty with overhead lacing task due to decreased shoulder stability and endurance. Pt required several rest breaks when completing.    Body Structure / Function / Physical Skills ADL;UE functional use;Pain;ROM;Strength;Fascial restriction    Plan P:Continue with strengthening and stability activities. Functional reaching task.    OT Home Exercise Plan eval: shoulder stretches 8/3: strengthening shoulder exercises. 8/4: interal rotation stretch 8/11: scapular strengthening with red band.    Consulted and Agree with Plan of Care Patient             Patient will benefit from skilled therapeutic intervention in order to improve the following deficits and impairments:   Body Structure / Function / Physical Skills: ADL, UE functional use, Pain, ROM, Strength, Fascial restriction       Visit Diagnosis: Other symptoms and signs involving the musculoskeletal system  Stiffness of right shoulder, not elsewhere classified    Problem List Patient Active Problem List   Diagnosis Date Noted   Other bursal cyst, right shoulder 01/16/2018   Primary osteoarthritis of left knee 01/16/2018   Rotator cuff arthropathy, right 01/16/2018   Primary localized osteoarthrosis of right shoulder region 11/22/2016   Status post arthroscopy of right shoulder 11/22/2016   Guaiac positive stools 12/30/2013   GERD 03/25/2010   GUAIAC POSITIVE STOOL 03/25/2010   NECK PAIN, CHRONIC 03/25/2010   HEADACHE 12/30/2009   IMPAIRED FASTING GLUCOSE 12/30/2009   ROM 11/26/2009   TINEA CRURIS 06/29/2009   CANDIDIASIS 06/29/2009   ECZEMA 03/17/2009   CARPAL TUNNEL  SYNDROME 12/15/2008   INSOMNIA 09/13/2008   BACK PAIN, LUMBAR, WITH RADICULOPATHY 09/09/2008   HYPERLIPIDEMIA 08/09/2008   FOOT PAIN, BILATERAL 08/04/2008   KNEE, ARTHRITIS, DEGEN./OSTEO 07/29/2008   FATIGUE 05/18/2008   ALLERGIC RHINITIS 10/17/2007   SHOULDER PAIN, BILATERAL 10/17/2007   CERVICAL RADICULOPATHY 10/07/2007   ANKLE PAIN, LEFT 08/19/2007   OBESITY, UNSPECIFIED 06/25/2007   DEPRESSION 06/25/2007   ASTHMA, UNSPECIFIED, UNSPECIFIED STATUS 06/25/2007   GYNECOMASTIA 06/25/2007   URINARY INCONTINENCE 06/25/2007    Ailene Ravel, OTR/L,CBIS  [phone]  10/07/2020, 3:37 PM  [facility] [address] [place], [name], [address] Phone: [phone]   Fax:  [phone]  Name: KABRIA VERRET MRN: [id number] Date of Birth: 03-Apr-1965

## 2020-10-11 ENCOUNTER — Encounter (HOSPITAL_COMMUNITY): Payer: Medicare Other

## 2020-10-12 ENCOUNTER — Encounter (HOSPITAL_COMMUNITY): Payer: Self-pay

## 2020-10-12 ENCOUNTER — Ambulatory Visit (HOSPITAL_COMMUNITY): Payer: Medicare Other

## 2020-10-12 ENCOUNTER — Other Ambulatory Visit: Payer: Self-pay

## 2020-10-12 DIAGNOSIS — R29898 Other symptoms and signs involving the musculoskeletal system: Secondary | ICD-10-CM

## 2020-10-12 DIAGNOSIS — M25611 Stiffness of right shoulder, not elsewhere classified: Secondary | ICD-10-CM

## 2020-10-12 NOTE — Therapy (Signed)
[employer], [name], [address] Phone: [phone]   Fax:  [phone]  Occupational Therapy Treatment  Patient Details  Name: Jessica Stevens MRN: [id number] Date of Birth: 11-Feb-1966 Referring Provider (OT): Tania Ade, MD   Encounter Date: 10/12/2020   OT End of Session - 10/12/20 1605     Visit Number 6    Number of Visits 8    Date for OT Re-Evaluation 10/13/20    Authorization Type UHC medicare    Authorization Time Period no copay, no authorization, no visit limit    Progress Note Due on Visit 10    OT Start Time 1520    OT Stop Time 1553    OT Time Calculation (min) 33 min    Activity Tolerance Patient tolerated treatment well    Behavior During Therapy [facility] for tasks assessed/performed             Past Medical History:  Diagnosis Date   Allergy    Anemia    Ankle pain, left    Anxiety    Arthritis of knee, degenerative    Asthma    Carpal tunnel syndrome    Cervical radiculopathy    Chronic back pain    Complete rupture of rotator cuff    Complete rupture of rotator cuff    Depression    GERD (gastroesophageal reflux disease)    Gynecomastia    HNP (herniated nucleus pulposus), cervical    HNP (herniated nucleus pulposus), cervical    Hyperlipidemia    Hypertension    Hypothyroidism    Knee pain    OA (osteoarthritis) of knee    Obesity    Sleep apnea    hx of    Urinary incontinence     Past Surgical History:  Procedure Laterality Date   arthroscopy  left knee  08/14/2008   Dr. Aline Brochure    BIOPSY  09/20/2020   Procedure: BIOPSY;  Surgeon: Doran Stabler, MD;  Location: [facility];  Service: Gastroenterology;;   COLONOSCOPY N/A 03/11/2014   Procedure: COLONOSCOPY;  Surgeon: Rogene Houston, MD;  [facility];  Service: Endoscopy;  Laterality: N/A;  730 - moved to 1/13 @ 12:00   COLONOSCOPY     ESOPHAGOGASTRODUODENOSCOPY (EGD) WITH PROPOFOL N/A  09/20/2020   Procedure: ESOPHAGOGASTRODUODENOSCOPY (EGD) WITH PROPOFOL;  Surgeon: Doran Stabler, MD;  Location: [facility];  Service: Gastroenterology;  Laterality: N/A;   PARTIAL HYSTERECTOMY     POLYPECTOMY     REVERSE SHOULDER ARTHROPLASTY Right 05/27/2020   Procedure: REVERSE SHOULDER ARTHROPLASTY;  Surgeon: Tania Ade, MD;  Location: [facility];  Service: Orthopedics;  Laterality: Right;   tube insert in right ear  02/27/2001    There were no vitals filed for this visit.   Subjective Assessment - 10/12/20 1608     Subjective  S: My left shoulder is giving me trouble. It's burning on the top and I don't know why.    Currently in Pain? No/denies                [facility] OT Assessment - 10/12/20 1609       Assessment   Medical Diagnosis right reverse TSA      Precautions   Precautions Fall    Precaution Comments progress as tolerated.                      OT Treatments/Exercises (OP) - 10/12/20  1531       Exercises   Exercises Shoulder      Shoulder Exercises: Standing   Protraction Strengthening;12 reps    Protraction Weight (lbs) 1    Horizontal ABduction Strengthening;12 reps    Horizontal ABduction Weight (lbs) 1    Flexion Strengthening;12 reps    Shoulder Flexion Weight (lbs) 1    ABduction Strengthening;12 reps    Shoulder ABduction Weight (lbs) 1    Extension Theraband;12 reps    Theraband Level (Shoulder Extension) Level 2 (Red)    Row Theraband;12 reps    Theraband Level (Shoulder Row) Level 2 (Red)    Retraction Theraband;12 reps    Theraband Level (Shoulder Retraction) Level 2 (Red)      Shoulder Exercises: ROM/Strengthening   Rebounder --   pace: 6.0   UBE (Upper Arm Bike) level 2 2' forward 2' reverse    X to V Arms 10X A/ROM    Proximal Shoulder Strengthening, Seated 12X 1# no rest breaks    Ball on Wall 1' flexion with green ball      Functional Reaching Activities   High Level patient placed 10 cones using right arm  focusing on shoulder flexion to middle shelf. Pt then removed cones while performing shoulder abduction.      Manual Therapy   Manual Therapy Myofascial release    Manual therapy comments manual therapy completed seperately from all other interventions this date of service    Myofascial Release Myofascial release and manual stretching completed to right upper arm and upper trapezius region to decrease fascial restrictions and increase joint mobility in a pain free zone.                      OT Short Term Goals - 09/29/20 1635       OT SHORT TERM GOAL #1   Title Pt will be provided with HEP to improve RUE use as dominant during daily tasks.     Time 4    Period Weeks    Status On-going    Target Date 10/13/20      OT SHORT TERM GOAL #2   Title Pt will decrease RUE fascial restrictions to min amount or less to improve her functional mobility that is needed to complete desired levels of reaching tasks.    Time 4    Period Weeks    Status On-going      OT SHORT TERM GOAL #3   Title Patient will increase her RUE A/ROM to [facility] in order to reach above shoulder level and behind her back with less difficulty.    Time 4    Period Weeks    Status On-going      OT SHORT TERM GOAL #4   Title Pt will increase her RUE strength to 4+/5 overall in order to lift household items of moderate weight with less difficulty.    Time 4    Period Weeks    Status On-going                      Plan - 10/12/20 1605     Clinical Impression Statement A: No fascial restrictions noted during plapation. Patient demonstrates full or functional ROM for all ranges. Limited range noted with external rotation both passively and actively. Focused on shoulder strengthening using 1# hand weights and increasing repetitions to 12. Added ball on the wall in flexion to focus on shoulder stability and endurance. VC for  form and technique were provided.    Body Structure / Function / Physical Skills  ADL;UE functional use;Pain;ROM;Strength;Fascial restriction    Plan P: Reassessment next session. Determine if additional therapy is needed. FOTO.    Consulted and Agree with Plan of Care Patient             Patient will benefit from skilled therapeutic intervention in order to improve the following deficits and impairments:   Body Structure / Function / Physical Skills: ADL, UE functional use, Pain, ROM, Strength, Fascial restriction       Visit Diagnosis: Stiffness of right shoulder, not elsewhere classified  Other symptoms and signs involving the musculoskeletal system    Problem List Patient Active Problem List   Diagnosis Date Noted   Other bursal cyst, right shoulder 01/16/2018   Primary osteoarthritis of left knee 01/16/2018   Rotator cuff arthropathy, right 01/16/2018   Primary localized osteoarthrosis of right shoulder region 11/22/2016   Status post arthroscopy of right shoulder 11/22/2016   Guaiac positive stools 12/30/2013   GERD 03/25/2010   GUAIAC POSITIVE STOOL 03/25/2010   NECK PAIN, CHRONIC 03/25/2010   HEADACHE 12/30/2009   IMPAIRED FASTING GLUCOSE 12/30/2009   ROM 11/26/2009   TINEA CRURIS 06/29/2009   CANDIDIASIS 06/29/2009   ECZEMA 03/17/2009   CARPAL TUNNEL SYNDROME 12/15/2008   INSOMNIA 09/13/2008   BACK PAIN, LUMBAR, WITH RADICULOPATHY 09/09/2008   HYPERLIPIDEMIA 08/09/2008   FOOT PAIN, BILATERAL 08/04/2008   KNEE, ARTHRITIS, DEGEN./OSTEO 07/29/2008   FATIGUE 05/18/2008   ALLERGIC RHINITIS 10/17/2007   SHOULDER PAIN, BILATERAL 10/17/2007   CERVICAL RADICULOPATHY 10/07/2007   ANKLE PAIN, LEFT 08/19/2007   OBESITY, UNSPECIFIED 06/25/2007   DEPRESSION 06/25/2007   ASTHMA, UNSPECIFIED, UNSPECIFIED STATUS 06/25/2007   GYNECOMASTIA 06/25/2007   URINARY INCONTINENCE 06/25/2007   Ailene Ravel, OTR/L,CBIS  [phone]  10/12/2020, 4:09 PM  [facility] [address] [place], [name],  [address] Phone: [phone]   Fax:  [phone]  Name: Jessica Stevens MRN: [id number] Date of Birth: [birth year]

## 2020-10-13 ENCOUNTER — Encounter (HOSPITAL_COMMUNITY): Payer: Medicare Other

## 2020-10-14 ENCOUNTER — Encounter (HOSPITAL_COMMUNITY): Payer: Self-pay

## 2020-10-14 ENCOUNTER — Other Ambulatory Visit: Payer: Self-pay

## 2020-10-14 ENCOUNTER — Ambulatory Visit (HOSPITAL_COMMUNITY): Payer: Medicare Other

## 2020-10-14 DIAGNOSIS — M25611 Stiffness of right shoulder, not elsewhere classified: Secondary | ICD-10-CM | POA: Diagnosis not present

## 2020-10-14 DIAGNOSIS — R29898 Other symptoms and signs involving the musculoskeletal system: Secondary | ICD-10-CM

## 2020-10-18 NOTE — Therapy (Signed)
[place], [name], [address] Phone: [phone]   Fax:  [phone]  Occupational Therapy Treatment Reassessment/discharge Patient Details  Name: Jessica Stevens MRN: [id number] Date of Birth: [birth year] Referring Provider (OT): Tania Ade, MD   Encounter Date: 10/14/2020   OT End of Session - 10/18/20 1414     Visit Number 7    Number of Visits 8    Authorization Type UHC medicare    Authorization Time Period no copay, no authorization, no visit limit    Progress Note Due on Visit 10    OT Start Time 1350   reassessment/discharge   OT Stop Time 1430    OT Time Calculation (min) 40 min    Activity Tolerance Patient tolerated treatment well    Behavior During Therapy WFL for tasks assessed/performed             Past Medical History:  Diagnosis Date   Allergy    Anemia    Ankle pain, left    Anxiety    Arthritis of knee, degenerative    Asthma    Carpal tunnel syndrome    Cervical radiculopathy    Chronic back pain    Complete rupture of rotator cuff    Complete rupture of rotator cuff    Depression    GERD (gastroesophageal reflux disease)    Gynecomastia    HNP (herniated nucleus pulposus), cervical    HNP (herniated nucleus pulposus), cervical    Hyperlipidemia    Hypertension    Hypothyroidism    Knee pain    OA (osteoarthritis) of knee    Obesity    Sleep apnea    hx of    Urinary incontinence     Past Surgical History:  Procedure Laterality Date   arthroscopy  left knee  08/14/2008   Dr. Aline Brochure    BIOPSY  09/20/2020   Procedure: BIOPSY;  Surgeon: Doran Stabler, MD;  Location: [facility];  Service: Gastroenterology;;   COLONOSCOPY N/A 03/11/2014   Procedure: COLONOSCOPY;  Surgeon: Rogene Houston, MD;  [facility];  Service: Endoscopy;  Laterality: N/A;  730 - moved to 1/13 @ 12:00   COLONOSCOPY     ESOPHAGOGASTRODUODENOSCOPY (EGD) WITH PROPOFOL N/A  09/20/2020   Procedure: ESOPHAGOGASTRODUODENOSCOPY (EGD) WITH PROPOFOL;  Surgeon: Doran Stabler, MD;  Location: [facility];  Service: Gastroenterology;  Laterality: N/A;   PARTIAL HYSTERECTOMY     POLYPECTOMY     REVERSE SHOULDER ARTHROPLASTY Right 05/27/2020   Procedure: REVERSE SHOULDER ARTHROPLASTY;  Surgeon: Tania Ade, MD;  Location: [facility];  Service: Orthopedics;  Laterality: Right;   tube insert in right ear  02/27/2001    There were no vitals filed for this visit.                 OT Treatments/Exercises (OP) - 10/18/20 1411       Exercises   Exercises Shoulder      Shoulder Exercises: ROM/Strengthening   UBE (Upper Arm Bike) level 2 2' forward 2' reverse   pace: 6.0                   OT Education - 10/18/20 1412     Education Details Provided green band for scapular strengthening HEP. Currently using red.    Person(s) Educated Patient    Methods Explanation    Comprehension Verbalized understanding  OT Short Term Goals - 10/14/20 1411       OT SHORT TERM GOAL #1   Title Pt will be provided with HEP to improve RUE use as dominant during daily tasks.     Time 4    Period Weeks    Status Achieved    Target Date 10/13/20      OT SHORT TERM GOAL #2   Title Pt will decrease RUE fascial restrictions to min amount or less to improve her functional mobility that is needed to complete desired levels of reaching tasks.    Time 4    Period Weeks    Status Achieved      OT SHORT TERM GOAL #3   Title Patient will increase her RUE A/ROM to [facility] in order to reach above shoulder level and behind her back with less difficulty.    Time 4    Period Weeks    Status Achieved      OT SHORT TERM GOAL #4   Title Pt will increase her RUE strength to 4+/5 overall in order to lift household items of moderate weight with less difficulty.    Time 4    Period Weeks    Status Achieved                      Plan -  10/18/20 1416     Clinical Impression Statement A: Reassessment completed this date. Patient is able to demonstrate functional strength and A/ROM [facility]. All therapy goals have been met. Patient reports that she is using her her UE for all daily tasks. She does notice more diffiulty with strength and being able demonstrate increased scapular stability. Reviewed HEP and recommendations for which exercises to continue at home were provided. All education was completed and patient is ready for discharge with HEP.    Body Structure / Function / Physical Skills ADL;UE functional use;Pain;ROM;Strength;Fascial restriction    Plan P: D/C from OT with HEP.    Consulted and Agree with Plan of Care Patient             Patient will benefit from skilled therapeutic intervention in order to improve the following deficits and impairments:   Body Structure / Function / Physical Skills: ADL, UE functional use, Pain, ROM, Strength, Fascial restriction       Visit Diagnosis: Other symptoms and signs involving the musculoskeletal system  Stiffness of right shoulder, not elsewhere classified    Problem List Patient Active Problem List   Diagnosis Date Noted   Other bursal cyst, right shoulder 01/16/2018   Primary osteoarthritis of left knee 01/16/2018   Rotator cuff arthropathy, right 01/16/2018   Primary localized osteoarthrosis of right shoulder region 11/22/2016   Status post arthroscopy of right shoulder 11/22/2016   Guaiac positive stools 12/30/2013   GERD 03/25/2010   GUAIAC POSITIVE STOOL 03/25/2010   NECK PAIN, CHRONIC 03/25/2010   HEADACHE 12/30/2009   IMPAIRED FASTING GLUCOSE 12/30/2009   ROM 11/26/2009   TINEA CRURIS 06/29/2009   CANDIDIASIS 06/29/2009   ECZEMA 03/17/2009   CARPAL TUNNEL SYNDROME 12/15/2008   INSOMNIA 09/13/2008   BACK PAIN, LUMBAR, WITH RADICULOPATHY 09/09/2008   HYPERLIPIDEMIA 08/09/2008   FOOT PAIN, BILATERAL 08/04/2008   KNEE, ARTHRITIS, DEGEN./OSTEO  07/29/2008   FATIGUE 05/18/2008   ALLERGIC RHINITIS 10/17/2007   SHOULDER PAIN, BILATERAL 10/17/2007   CERVICAL RADICULOPATHY 10/07/2007   ANKLE PAIN, LEFT 08/19/2007   OBESITY, UNSPECIFIED 06/25/2007   DEPRESSION 06/25/2007   ASTHMA,  UNSPECIFIED, UNSPECIFIED STATUS 06/25/2007   GYNECOMASTIA 06/25/2007   URINARY INCONTINENCE 06/25/2007    OCCUPATIONAL THERAPY DISCHARGE SUMMARY  Visits from Start of Care: 7   Current functional level related to goals / functional outcomes: See above   Remaining deficits: See above   Education / Equipment: See above   Patient agrees to discharge. Patient goals were met. Patient is being discharged due to meeting the stated rehab goals.Ailene Ravel, OTR/L,CBIS  [phone]  10/18/2020, 2:19 PM  [facility] [address] [place], [name], [address] Phone: [phone]   Fax:  [phone]  Name: Jessica Stevens MRN: [id number] Date of Birth: [birth year]

## 2020-10-19 DIAGNOSIS — M50122 Cervical disc disorder at C5-C6 level with radiculopathy: Secondary | ICD-10-CM | POA: Diagnosis not present

## 2020-10-21 ENCOUNTER — Other Ambulatory Visit: Payer: Self-pay | Admitting: Surgery

## 2020-10-21 ENCOUNTER — Other Ambulatory Visit (HOSPITAL_COMMUNITY): Payer: Self-pay | Admitting: Surgery

## 2020-10-26 ENCOUNTER — Encounter: Payer: Medicare Other | Attending: Surgery | Admitting: Skilled Nursing Facility1

## 2020-10-26 DIAGNOSIS — E669 Obesity, unspecified: Secondary | ICD-10-CM | POA: Insufficient documentation

## 2020-10-26 DIAGNOSIS — Z6841 Body Mass Index (BMI) 40.0 and over, adult: Secondary | ICD-10-CM | POA: Insufficient documentation

## 2020-10-26 DIAGNOSIS — Z713 Dietary counseling and surveillance: Secondary | ICD-10-CM | POA: Insufficient documentation

## 2020-10-26 DIAGNOSIS — I10 Essential (primary) hypertension: Secondary | ICD-10-CM | POA: Insufficient documentation

## 2020-10-28 ENCOUNTER — Other Ambulatory Visit: Payer: Self-pay

## 2020-10-28 ENCOUNTER — Ambulatory Visit (HOSPITAL_COMMUNITY)
Admission: RE | Admit: 2020-10-28 | Discharge: 2020-10-28 | Disposition: A | Discharge status: Home / Self Care | Payer: Medicare Other | Source: Ambulatory Visit | Attending: Surgery | Admitting: Surgery

## 2020-10-28 ENCOUNTER — Encounter: Payer: Medicare Other | Attending: Surgery | Admitting: Skilled Nursing Facility1

## 2020-10-28 DIAGNOSIS — K219 Gastro-esophageal reflux disease without esophagitis: Secondary | ICD-10-CM | POA: Diagnosis not present

## 2020-10-28 DIAGNOSIS — Z01818 Encounter for other preprocedural examination: Secondary | ICD-10-CM | POA: Diagnosis not present

## 2020-10-28 DIAGNOSIS — Z6841 Body Mass Index (BMI) 40.0 and over, adult: Secondary | ICD-10-CM | POA: Insufficient documentation

## 2020-10-28 DIAGNOSIS — I10 Essential (primary) hypertension: Secondary | ICD-10-CM | POA: Diagnosis not present

## 2020-10-28 DIAGNOSIS — E669 Obesity, unspecified: Secondary | ICD-10-CM | POA: Insufficient documentation

## 2020-10-28 NOTE — Progress Notes (Signed)
Supervised Weight Loss Visit Bariatric Nutrition Education  3 out of 6 [facility] Appointments    NUTRITION ASSESSMENT  Anthropometrics  Start weight at [facility]: 264 lbs (date: 07/20/2020)  Weight: 264.8 pounds Height: 61 in BMI: 50.03 kg/m2     Clinical  Medical hx: depression, HTN Medications: see list; multivitamin, calcium Labs: Hemoglobin 10.9, HCT 35.6 Notable signs/symptoms: back and knee pain Any previous deficiencies? anemia  Lifestyle & Dietary Hx  Pt states her knee causes a lot of pain which causes anxiety.   Pt states her knee is causing a significant amount of pain for her; arrived limping. Pt states she read the label on her juice finding it had 22 grams of sugar.  Pt state she adds water or ice to her boost.  Pt states she has finished therapy for her shoulder. Pt state she has no energy and feels her legs are very heavy.   Estimated daily fluid intake: unknwon oz Supplements: multivitamin, vitamin D Current average weekly physical activity: ADL's  24-Hr Dietary Recall First Meal: boost  Snack: 1 cup of applesauce  Second Meal 11-12: boost  Snack: peanuts and peanut butter crackers or fruit Third Meal 6:30 pm tossed salad: [place] dress + cheese + pineapple + tomato + banana peppers Snack:  Beverages: water, smoothie juice   Estimated Energy Needs Calories: 1500   NUTRITION DIAGNOSIS  Overweight/obesity ([place]-3.3) related to past poor dietary habits and physical inactivity as evidenced by patient w/ planned RYGB surgery following dietary guidelines for continued weight loss.   NUTRITION INTERVENTION  Nutrition counseling (C-1) and education (E-2) to facilitate bariatric surgery goals.  Pre-Op Goals Progress & New Goals Continue: Read your nutrition facts label (accomplished) Continue/NEW: Track food and beverage intake pen and paper (not accomplished) Continue: Consume 3 meals per day or try to eat every 3-5 hours: try yogurt for breakfast (not  accomplished) continue: call the number on the back of your insurance card to talk to a mental health professional: states she did not do this but talks to her sister about the lord which helps continue: Order groceries online: rides the cart at the store now continue: take pictures of everything you eat and drink: did not do continue Eat lunch continue: read your juice label if more than 2g of sugar do not drink it continue: from lunch to dinner only half 1 pack of crackers or 1/4 cup of nuts  continue: add a vegetables to dinner 7 days a week  Handouts Previously Provided Include  Meal ideas   Learning Style & Readiness for Change Teaching method utilized: Visual & Auditory  Demonstrated degree of understanding via: Teach Back  Readiness Level: Action Barriers to learning/adherence to lifestyle change: some disabilities   RD's Notes for next Visit  Assess pts adherence to chosen goals   MONITORING & EVALUATION Dietary intake, weekly physical activity, body weight, and pre-op goals in 1 month.   Next Steps  Patient is to return to [facility] for next appt

## 2020-11-02 DIAGNOSIS — M25512 Pain in left shoulder: Secondary | ICD-10-CM | POA: Diagnosis not present

## 2020-11-17 DIAGNOSIS — M4722 Other spondylosis with radiculopathy, cervical region: Secondary | ICD-10-CM | POA: Diagnosis not present

## 2020-11-17 DIAGNOSIS — M50122 Cervical disc disorder at C5-C6 level with radiculopathy: Secondary | ICD-10-CM | POA: Diagnosis not present

## 2020-11-18 DIAGNOSIS — M25512 Pain in left shoulder: Secondary | ICD-10-CM | POA: Diagnosis not present

## 2020-11-18 DIAGNOSIS — M25569 Pain in unspecified knee: Secondary | ICD-10-CM | POA: Diagnosis not present

## 2020-11-26 DIAGNOSIS — M1990 Unspecified osteoarthritis, unspecified site: Secondary | ICD-10-CM | POA: Diagnosis not present

## 2020-11-29 DIAGNOSIS — M50122 Cervical disc disorder at C5-C6 level with radiculopathy: Secondary | ICD-10-CM | POA: Diagnosis not present

## 2020-11-30 ENCOUNTER — Other Ambulatory Visit: Payer: Self-pay

## 2020-11-30 ENCOUNTER — Encounter: Payer: Medicare Other | Attending: Surgery | Admitting: Skilled Nursing Facility1

## 2020-11-30 DIAGNOSIS — E669 Obesity, unspecified: Secondary | ICD-10-CM | POA: Insufficient documentation

## 2020-11-30 NOTE — Progress Notes (Signed)
Supervised Weight Loss Visit Bariatric Nutrition Education  4 out of 6 [facility] Appointments    NUTRITION ASSESSMENT  Anthropometrics  Start weight at [facility]: 264 lbs (date: 07/20/2020)  Weight: 272.3 pounds Height: 61 in BMI: 51.39 kg/m2     Clinical  Medical hx: depression, HTN Medications: see list; multivitamin, calcium Labs: Hemoglobin 10.9, HCT 35.6 Notable signs/symptoms: back and knee pain Any previous deficiencies? anemia  Lifestyle & Dietary Hx  Pt states her knee causes a lot of pain which causes anxiety. Pt arrives with a cane stating she has been falling a lot lately.   Pt states she struggles to lift her leg/foot up high enough not to stumble on.  Pt states she has been doing some logging of her meals but she is has been in too much pain to pay attention spending most of her time in the bed. Pt states her son has been cooking for her and taking care of her.  Pt states she has been taking prednisone too.   Estimated daily fluid intake: unknwon oz Supplements: multivitamin, vitamin D Current average weekly physical activity: ADL's  24-Hr Dietary Recall First Meal: boost  Snack: 1 cup of applesauce  Second Meal 11-12: boost or bologna sandwich Snack: peanuts and peanut butter crackers or fruit + mini muffins Third Meal 6:30 pm tossed salad: [place] dress + cheese + pineapple + tomato + banana peppers Snack:  Beverages: water  Estimated Energy Needs Calories: 1500   NUTRITION DIAGNOSIS  Overweight/obesity ([place]-3.3) related to past poor dietary habits and physical inactivity as evidenced by patient w/ planned RYGB surgery following dietary guidelines for continued weight loss.   NUTRITION INTERVENTION  Nutrition counseling (C-1) and education (E-2) to facilitate bariatric surgery goals.  Pre-Op Goals Progress & New Goals Continue: Read your nutrition facts label (accomplished) Continue Track food and beverage intake pen and paper (not accomplished) Continue:  Consume 3 meals per day or try to eat every 3-5 hours: try yogurt for breakfast (not accomplished) continue: call the number on the back of your insurance card to talk to a mental health professional: states she did not do this but talks to her sister about the lord which helps continue: Order groceries online: rides the cart at the store now continue: take pictures of everything you eat and drink: did not do continue Eat lunch continue: read your juice label if more than 2g of sugar do not drink it continue: from lunch to dinner only half 1 pack of crackers or 1/4 cup of nuts  continue: add a vegetables to dinner 7 days a week NEW: limit your portion sizes; only eating 3 meals no snacks   Handouts Previously Provided Include  Meal ideas   Learning Style & Readiness for Change Teaching method utilized: Visual & Auditory  Demonstrated degree of understanding via: Teach Back  Readiness Level: Action Barriers to learning/adherence to lifestyle change: some disabilities   RD's Notes for next Visit  Assess pts adherence to chosen goals   MONITORING & EVALUATION Dietary intake, weekly physical activity, body weight, and pre-op goals in 1 month.   Next Steps  Patient is to return to [facility] for next appt

## 2020-12-07 DIAGNOSIS — Z0001 Encounter for general adult medical examination with abnormal findings: Secondary | ICD-10-CM | POA: Diagnosis not present

## 2020-12-14 DIAGNOSIS — N644 Mastodynia: Secondary | ICD-10-CM | POA: Diagnosis not present

## 2020-12-14 DIAGNOSIS — M25562 Pain in left knee: Secondary | ICD-10-CM | POA: Diagnosis not present

## 2020-12-20 DIAGNOSIS — M50122 Cervical disc disorder at C5-C6 level with radiculopathy: Secondary | ICD-10-CM | POA: Diagnosis not present

## 2020-12-20 DIAGNOSIS — M4722 Other spondylosis with radiculopathy, cervical region: Secondary | ICD-10-CM | POA: Diagnosis not present

## 2020-12-22 DIAGNOSIS — M25562 Pain in left knee: Secondary | ICD-10-CM | POA: Diagnosis not present

## 2020-12-22 DIAGNOSIS — M25571 Pain in right ankle and joints of right foot: Secondary | ICD-10-CM | POA: Diagnosis not present

## 2020-12-27 DIAGNOSIS — M1990 Unspecified osteoarthritis, unspecified site: Secondary | ICD-10-CM | POA: Diagnosis not present

## 2020-12-28 ENCOUNTER — Encounter: Payer: Medicare Other | Attending: Surgery | Admitting: Skilled Nursing Facility1

## 2020-12-28 ENCOUNTER — Other Ambulatory Visit: Payer: Self-pay

## 2020-12-28 DIAGNOSIS — Z713 Dietary counseling and surveillance: Secondary | ICD-10-CM | POA: Diagnosis not present

## 2020-12-28 DIAGNOSIS — I10 Essential (primary) hypertension: Secondary | ICD-10-CM | POA: Insufficient documentation

## 2020-12-28 DIAGNOSIS — E669 Obesity, unspecified: Secondary | ICD-10-CM | POA: Insufficient documentation

## 2020-12-28 DIAGNOSIS — Z6841 Body Mass Index (BMI) 40.0 and over, adult: Secondary | ICD-10-CM | POA: Insufficient documentation

## 2020-12-28 NOTE — Progress Notes (Signed)
Supervised Weight Loss Visit Bariatric Nutrition Education  5 out of 6 SW[facility] Appointments    NUTRITION ASSESSMENT  Anthropometrics  Start weight at [facility]: 264 lbs (date: 07/20/2020)  Weight: 275.3 pounds Height: 61 in BMI: 52.07 kg/m2     Clinical  Medical hx: depression, HTN Medications: see list; multivitamin, calcium Labs: Hemoglobin 10.9, HCT 35.6 Notable signs/symptoms: back and knee pain Any previous deficiencies? anemia  Lifestyle & Dietary Hx  Pt states her knee causes a lot of pain which causes anxiety.  Pt states she struggles to lift her leg/foot up high enough not to stumble on.  Pt states her son has been cooking for her and taking care of her.   Pt states she woke with her foot throbbing the other day stating her doctor just told her to take double the hydrocodone. Pt states she is still taking prednisone.   Pt states her doctor advised it was time to get her thyroid checked again.  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  Pt state she does not have much will power. Pt state she has changed waking up and staying up instead of going back to bed. Pt state she really does not have anything to do throughout the day.   Estimated daily fluid intake: unknwon oz Supplements: multivitamin, vitamin D Current average weekly physical activity: ADL's  24-Hr Dietary Recall First Meal: skipped Snack:  Second Meal 11-12 greens + butter + onion + smoked [place] neck Snack: peanuts and peanut butter crackers or fruit + mini muffins Third Meal 6:30 pm  greens + butter + onion + smoked [place] neck Snack:  Beverages: water  Estimated Energy Needs Calories: 1500   NUTRITION DIAGNOSIS  Overweight/obesity ([place]-3.3) related to past poor dietary habits and physical inactivity as evidenced by patient w/ planned RYGB surgery following dietary guidelines for continued weight loss.   NUTRITION INTERVENTION  Nutrition counseling (C-1) and education (E-2) to  facilitate bariatric surgery goals.  Pre-Op Goals Progress & New Goals Continue: Read your nutrition facts label (accomplished) Continue Track food and beverage intake pen and paper (not accomplished) Continue: Consume 3 meals per day or try to eat every 3-5 hours: try yogurt for breakfast (not accomplished) continue: call the number on the back of your insurance card to talk to a mental health professional: states she did not do this but talks to her sister about the lord which helps continue: Order groceries online: rides the cart at the store now continue: take pictures of everything you eat and drink: did not do continue Eat lunch continue: read your juice label if more than 2g of sugar do not drink it continue: from lunch to dinner only half 1 pack of crackers or 1/4 cup of nuts  continue: add a vegetables to dinner 7 days a week continue: limit your portion sizes; only eating 3 meals no snacks  NEW: do not eat fried food NEW: only eat off your sandwich plate NEW: do your arm chair exercises off the video daily   Handouts Previously Provided Include  Meal ideas   Learning Style & Readiness for Change Teaching method utilized: Visual & Auditory  Demonstrated degree of understanding via: Teach Back  Readiness Level: Action Barriers to learning/adherence to lifestyle change: some disabilities   RD's Notes for next Visit  Assess pts adherence to chosen goals   MONITORING & EVALUATION Dietary intake, weekly physical activity, body weight, and pre-op goals  Next Steps  Patient  is to return to [facility] for next appt

## 2021-01-25 DIAGNOSIS — M25571 Pain in right ankle and joints of right foot: Secondary | ICD-10-CM | POA: Diagnosis not present

## 2021-01-25 DIAGNOSIS — M419 Scoliosis, unspecified: Secondary | ICD-10-CM | POA: Diagnosis not present

## 2021-01-25 DIAGNOSIS — M25512 Pain in left shoulder: Secondary | ICD-10-CM | POA: Diagnosis not present

## 2021-01-26 DIAGNOSIS — M1990 Unspecified osteoarthritis, unspecified site: Secondary | ICD-10-CM | POA: Diagnosis not present

## 2021-01-27 ENCOUNTER — Encounter: Payer: Self-pay | Admitting: Dietician

## 2021-01-27 ENCOUNTER — Encounter: Payer: Medicare Other | Attending: Surgery | Admitting: Dietician

## 2021-01-27 ENCOUNTER — Other Ambulatory Visit: Payer: Self-pay

## 2021-01-27 DIAGNOSIS — E669 Obesity, unspecified: Secondary | ICD-10-CM | POA: Diagnosis present

## 2021-01-27 NOTE — Patient Instructions (Signed)
Use your walking cane or crutch as often as possible to assist you while walking and to prevent falls.

## 2021-01-27 NOTE — Progress Notes (Signed)
Pt has cleared nutrition requirements. No further supervised visits required/recommended.  Supervised Weight Loss Visit Bariatric Nutrition Education  6 out of 6 [facility] Appointments   NUTRITION ASSESSMENT  Anthropometrics  Start weight at [facility]: 264 lbs (date: 07/20/2020)  Weight: 261.6 pounds Height: 61 in BMI: 52.07 kg/m2     Clinical  Medical hx: depression, HTN Medications: see list; multivitamin, calcium Labs: Hemoglobin 10.9, HCT 35.6 Notable signs/symptoms: back and knee pain Any previous deficiencies? anemia  Lifestyle & Dietary Hx Pt reports ankle pain on the leg opposite of their knee pain. Pt states this has caused them to fall almost 10 times, no injuries with the falls. Pt states their balance is getting worse, they have a cane they can use.  Pt is avoiding fried foods, eating baked foods.  Pt has no concerns about surgery, is looking forward to getting it done.  Pt states her knee causes a lot of pain which causes anxiety.  Pt states she struggles to lift her leg/foot up high enough not to stumble on.  Pt states her son has been cooking for her and taking care of her.   Pt states she woke with her foot throbbing the other day stating her doctor just told her to take double the hydrocodone. Pt states she is still taking prednisone.   Pt states her doctor advised it was time to get her thyroid checked again.  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  Pt state she does not have much will power. Pt state she has changed waking up and staying up instead of going back to bed. Pt state she really does not have anything to do throughout the day.   Estimated daily fluid intake: unknwon oz Supplements: multivitamin, vitamin D Current average weekly physical activity: ADL's  24-Hr Dietary Recall First Meal: Chicken sandwich, whole wheat bread, miracle whip lite, red onions Snack:  Second Meal 11-12  Snack: peanuts and peanut butter crackers or  fruit + mini muffins Third Meal 6:30 pm  greens + butter + onion + smoked [place] neck Snack:  Beverages: water  Estimated Energy Needs Calories: 1500   NUTRITION DIAGNOSIS  Overweight/obesity (DeBary-3.3) related to past poor dietary habits and physical inactivity as evidenced by patient w/ planned RYGB surgery following dietary guidelines for continued weight loss.   NUTRITION INTERVENTION  Nutrition counseling (C-1) and education (E-2) to facilitate bariatric surgery goals.  Pre-Op Goals Progress & New Goals Continue: Read your nutrition facts label (accomplished) Continue Track food and beverage intake pen and paper (not accomplished) Continue: Consume 3 meals per day or try to eat every 3-5 hours: try yogurt for breakfast (not accomplished) continue: call the number on the back of your insurance card to talk to a mental health professional: states she did not do this but talks to her sister about the lord which helps continue: Order groceries online: rides the cart at the store now continue: take pictures of everything you eat and drink: did not do continue Eat lunch continue: read your juice label if more than 2g of sugar do not drink it continue: from lunch to dinner only half 1 pack of crackers or 1/4 cup of nuts  continue: add a vegetables to dinner 7 days a week continue: limit your portion sizes; only eating 3 meals no snacks  continue: do not eat fried food continue: only eat off your sandwich plate continue:do your arm chair exercises off the video daily  NEW:  Use your walking cane or crutch as often as possible to assist you while walking and to prevent falls.  Handouts Previously Provided Include  Meal ideas   Learning Style & Readiness for Change Teaching method utilized: Visual & Auditory  Demonstrated degree of understanding via: Teach Back  Readiness Level: Action Barriers to learning/adherence to lifestyle change: some disabilities   RD's Notes for next Visit   Assess pts adherence to chosen goals   MONITORING & EVALUATION Dietary intake, weekly physical activity, body weight, and pre-op goals  Next Steps  Surgical center will schedule surgery date, return to [facility] for Pre-op class.  Pt has cleared nutrition requirements. No further supervised visits required/recommended.

## 2021-02-15 DIAGNOSIS — M25512 Pain in left shoulder: Secondary | ICD-10-CM | POA: Diagnosis not present

## 2021-02-15 DIAGNOSIS — G894 Chronic pain syndrome: Secondary | ICD-10-CM | POA: Diagnosis not present

## 2021-03-01 DIAGNOSIS — M542 Cervicalgia: Secondary | ICD-10-CM | POA: Diagnosis not present

## 2021-03-01 DIAGNOSIS — M25512 Pain in left shoulder: Secondary | ICD-10-CM | POA: Diagnosis not present

## 2021-03-29 DIAGNOSIS — M1990 Unspecified osteoarthritis, unspecified site: Secondary | ICD-10-CM | POA: Diagnosis not present

## 2021-04-06 DIAGNOSIS — H905 Unspecified sensorineural hearing loss: Secondary | ICD-10-CM | POA: Diagnosis not present

## 2021-04-08 ENCOUNTER — Ambulatory Visit (INDEPENDENT_AMBULATORY_CARE_PROVIDER_SITE_OTHER): Payer: Medicare Other | Admitting: Psychology
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
## 2021-04-20 ENCOUNTER — Ambulatory Visit (INDEPENDENT_AMBULATORY_CARE_PROVIDER_SITE_OTHER): Payer: Medicare Other | Admitting: Psychology

## 2021-04-20 DIAGNOSIS — F509 Eating disorder, unspecified: Secondary | ICD-10-CM

## 2021-04-20 NOTE — Progress Notes (Signed)
Date of Appointment: 04/20/2021  Time Seen: 10am Duration: 25 minutes (15 minute follow-up, 10 minute report writing) Type of Session: Follow-up Appointment for Evaluation  Location of Patient: Home (private location) Location of Provider: At home in a private office due to COVID-19 pandemic Type of Contact: Caregility video visit with audio  Jessica Stevens participated in the session via [place] video visit with audio, from the privacy of their home due to the COVID-19 pandemic. Jakyra provided verbal consent to proceed with today's appointment. This provider participated from a private home office. Today's interactive feedback session was completed which included reviewing the following measures: Beck Anxiety Inventory (BAI), Becks Depression Index (BDI), Eating Disorder Diagnostic Scale (EDDS), Mental Status Examination (MSE), & Mood Disorder Questionnaire (MDQ). During this interactive feedback session, this provider and Jessica Stevens discussed the results of the aforementioned measures, treatment recommendations, and the final determination regarding the psychological approval for bariatric surgery.   Please see the bariatric assessment for additional details. This provider completed the written report which includes integration of patient data, interpretation of standardized test results, interpretation of clinical data, review of referral from surgeon & clinical decision making (100 minutes in total).  The interactive feedback session was completed today and a total of 25 minutes was spent on feedback and report writing. No billing code for the feedback appointment.  Mental Status Examination:  Appearance:  neat Behavior: appropriate to circumstances Mood: euthymic Affect: mood congruent Speech: WNL Eye Contact: appropriate Psychomotor Activity: WNL Thought Process: linear, logical, and goal directed Content/Perceptual Disturbances: none Orientation: AAOx4 Cognition/Sensorium: intact Insight:  good Judgment: good  Time Requirements: Feedback: 25 total minutes (no billing code) Report writing: 100 total minutes. 04/01/2021: 8:50-9:25am. Apr 12, 2021: 4:55-6pm. (billing codes [phone] and [phone])  DSM-5 Diagnosis(es) code: E66.01 Morbid Obesity  Plan: Jessica Stevens provided verbal consent for her evaluation to be sent to [facility]. No further follow-up planned by this provider.                    Jessica Lory, PsyD

## 2021-05-02 ENCOUNTER — Encounter: Payer: Medicare Other | Attending: Surgery | Admitting: Skilled Nursing Facility1

## 2021-05-02 ENCOUNTER — Other Ambulatory Visit: Payer: Self-pay

## 2021-05-02 DIAGNOSIS — Z6841 Body Mass Index (BMI) 40.0 and over, adult: Secondary | ICD-10-CM | POA: Insufficient documentation

## 2021-05-02 DIAGNOSIS — E669 Obesity, unspecified: Secondary | ICD-10-CM | POA: Insufficient documentation

## 2021-05-02 DIAGNOSIS — Z713 Dietary counseling and surveillance: Secondary | ICD-10-CM | POA: Insufficient documentation

## 2021-05-02 NOTE — Progress Notes (Signed)
Pre-Operative Nutrition Class:   ? ?Patient was seen on 05/02/2021 for Pre-Operative Bariatric Surgery Education at the [facility].   ? ?Surgery date:  ?Surgery type: RYGB ?Start weight at [facility]: 264 ?Weight today: 269 ? ?Samples given per MNT protocol. Patient educated on appropriate usage: ?Bariatric Advantage Multivitamin ?Lot # [id number] ?Exp:10/23 ? ?Ensure max  Shake ?Lot # [id number] ?Exp: 05/28/2021 ? ? ?The following the learning objectives were met by the patient during this course: ?Identify Pre-Op Dietary Goals and will begin 2 weeks pre-operatively ?Identify appropriate sources of fluids and proteins  ?State protein recommendations and appropriate sources pre and post-operatively ?Identify Post-Operative Dietary Goals and will follow for 2 weeks post-operatively ?Identify appropriate multivitamin and calcium sources ?Describe the need for physical activity post-operatively and will follow MD recommendations ?State when to call healthcare provider regarding medication questions or post-operative complications ?When having a diagnosis of diabetes understanding hypoglycemia symptoms and the inclusion of 1 complex carbohydrate per meal ? ?Handouts given during class include: ?Pre-Op Bariatric Surgery Diet Handout ?Protein Shake Handout ?Post-Op Bariatric Surgery Nutrition Handout ?BELT Program Information Flyer ?Support Group Information Flyer ?[facility] Bariatric Supplements Price List ? ?Follow-Up Plan: ?Patient will follow-up at [facility] 2 weeks post operatively for diet advancement per MD. ? ?

## 2021-05-05 DIAGNOSIS — I10 Essential (primary) hypertension: Secondary | ICD-10-CM | POA: Diagnosis not present

## 2021-05-05 DIAGNOSIS — K219 Gastro-esophageal reflux disease without esophagitis: Secondary | ICD-10-CM | POA: Diagnosis not present

## 2021-05-05 DIAGNOSIS — E785 Hyperlipidemia, unspecified: Secondary | ICD-10-CM | POA: Diagnosis not present

## 2021-05-12 NOTE — Progress Notes (Signed)
Surgery orders requested via Epic inbox. °

## 2021-05-13 ENCOUNTER — Ambulatory Visit: Payer: Self-pay | Admitting: Surgery

## 2021-05-13 DIAGNOSIS — I10 Essential (primary) hypertension: Secondary | ICD-10-CM | POA: Diagnosis not present

## 2021-05-13 DIAGNOSIS — E039 Hypothyroidism, unspecified: Secondary | ICD-10-CM | POA: Diagnosis not present

## 2021-05-13 DIAGNOSIS — Z01818 Encounter for other preprocedural examination: Secondary | ICD-10-CM

## 2021-05-13 NOTE — Patient Instructions (Addendum)
DUE TO COVID-19 ONLY ONE VISITOR  (aged 57 and older)  IS ALLOWED TO COME WITH YOU AND STAY IN THE WAITING ROOM ONLY DURING PRE OP AND PROCEDURE.   ?**NO VISITORS ARE ALLOWED IN THE SHORT STAY AREA OR RECOVERY ROOM!!** ? ?IF YOU WILL BE ADMITTED INTO THE HOSPITAL YOU ARE ALLOWED ONLY TWO SUPPORT PEOPLE DURING VISITATION HOURS ONLY (7 AM -8PM)   ?The support person(s) must pass our screening, gel in and out, and wear a mask at all times, including in the patient?s room. ?Patients must also wear a mask when staff or their support person are in the room. ?Visitors GUEST BADGE MUST BE WORN VISIBLY  ?One adult visitor may remain with you overnight and MUST be in the room by 8 P.M. ?  ? ? Your procedure is scheduled on: 05/30/21 ? ? Report to [facility] Main Entrance ? ?  Report to admitting at 10:15 AM ? ? Call this number if you have problems the morning of surgery [phone] ? ? MORNING OF SURGERY DRINK:   DRINK 1 G2 drink BEFORE YOU LEAVE HOME, DRINK ALL OF THE  G2 DRINK AT ONE TIME.   ?NO SOLID FOOD AFTER 600 PM THE NIGHT BEFORE YOUR SURGERY. YOU MAY DRINK CLEAR FLUIDS. THE G2 DRINK YOU DRINK BEFORE YOU LEAVE HOME WILL BE THE LAST FLUIDS YOU DRINK BEFORE SURGERY. ? ?PAIN IS EXPECTED AFTER SURGERY AND WILL NOT BE COMPLETELY ELIMINATED. AMBULATION AND TYLENOL WILL HELP REDUCE INCISIONAL AND GAS PAIN. MOVEMENT IS KEY! ? ?YOU ARE EXPECTED TO BE OUT OF BED WITHIN 4 HOURS OF ADMISSION TO YOUR PATIENT ROOM. ? ?SITTING IN THE RECLINER THROUGHOUT THE DAY IS IMPORTANT FOR DRINKING FLUIDS AND MOVING GAS THROUGHOUT THE GI TRACT. ? ?COMPRESSION STOCKINGS SHOULD BE WORN [place] UNLESS YOU ARE WALKING.  ? ?INCENTIVE SPIROMETER SHOULD BE USED EVERY HOUR WHILE AWAKE TO DECREASE POST-OPERATIVE COMPLICATIONS SUCH AS PNEUMONIA. ? ?WHEN DISCHARGED HOME, IT IS IMPORTANT TO CONTINUE TO WALK EVERY HOUR AND USE THE INCENTIVE SPIROMETER EVERY HOUR.  ? ? You may have the following liquids until 9:30 AM DAY OF  SURGERY ? ?Water ?Black Coffee (sugar ok, NO MILK/CREAM OR CREAMERS)  ?Tea (sugar ok, NO MILK/CREAM OR CREAMERS) regular and decaf                             ?Plain Jell-O (NO RED)                                           ?Fruit ices (not with fruit pulp, NO RED)                                     ?Popsicles (NO RED)                                                                  ?Juice: apple, WHITE grape, WHITE cranberry ?Sports drinks like Gatorade (NO RED) ?Clear broth(vegetable,chicken,beef) ?  ?The day of surgery:  ?Drink ONE (1) Pre-Surgery  G2 at 9:30 AM the morning of surgery. Drink in one sitting. Do not sip.  ?This drink was given to you during your hospital  ?pre-op appointment visit. ?Nothing else to drink after completing the  ?Pre-Surgery G2. ?  ?       If you have questions, please contact your surgeon?s office. ? ? ?FOLLOW BOWEL PREP AND ANY ADDITIONAL PRE OP INSTRUCTIONS YOU RECEIVED FROM YOUR SURGEON'S OFFICE!!! ?  ?  ?Oral Hygiene is also important to reduce your risk of infection.                                    ?Remember - BRUSH YOUR TEETH THE MORNING OF SURGERY WITH YOUR REGULAR TOOTHPASTE ? ? Please call our pharmacy at [phone] to go over your medication list ? ? Take these medicines the morning of surgery with A SIP OF WATER: Inhalers, Amlodipine, Bupropion, Buspirone, Duloxetine, Gabapentin, Norco, Levothyroxine, Pantoprazole, Rosuvastatin.  ?                  ?           You may not have any metal on your body including hair pins, jewelry, and body piercing ? ?           Do not wear make-up, lotions, powders, perfumes, or deodorant ? ?Do not wear nail polish including gel and S&S, artificial/acrylic nails, or any other type of covering on natural nails including finger and toenails. If you have artificial nails, gel coating, etc. that needs to be removed by a nail salon please have this removed prior to surgery or surgery may need to be canceled/ delayed if the surgeon/  anesthesia feels like they are unable to be safely monitored.  ? ?Do not shave  48 hours prior to surgery.  ? ? Do not bring valuables to the hospital. [facility] NOT ?            RESPONSIBLE   FOR VALUABLES. ? ? Bring small overnight bag day of surgery. ? ?            Please read over the following fact sheets you were given: IF [place] [phone]- Apolonio Schneiders ? ?   [facility] - Preparing for Surgery ?Before surgery, you can play an important role.  Because skin is not sterile, your skin needs to be as free of germs as possible.  You can reduce the number of germs on your skin by washing with CHG (chlorahexidine gluconate) soap before surgery.  CHG is an antiseptic cleaner which kills germs and bonds with the skin to continue killing germs even after washing. ?Please DO NOT use if you have an allergy to CHG or antibacterial soaps.  If your skin becomes reddened/irritated stop using the CHG and inform your nurse when you arrive at Short Stay. ?Do not shave (including legs and underarms) for at least 48 hours prior to the first CHG shower.  You may shave your face/neck. ? ?Please follow these instructions carefully: ? 1.  Shower with CHG Soap the night before surgery and the  morning of surgery. ? 2.  If you choose to wash your hair, wash your hair first as usual with your normal  shampoo. ? 3.  After you shampoo, rinse your hair and body thoroughly to remove the shampoo.                            ?  4.  Use CHG as you would any other liquid soap.  You can apply chg directly to the skin and wash.  Gently with a scrungie or clean washcloth. ? 5.  Apply the CHG Soap to your body ONLY FROM THE NECK DOWN.   Do   not use on face/ open      ?                     Wound or open sores. Avoid contact with eyes, ears mouth and   genitals (private parts).  ?                     Production manager,  Genitals (private parts) with your normal soap. ?            6.  Wash thoroughly,  paying special attention to the area where your    surgery  will be performed. ? 7.  Thoroughly rinse your body with warm water from the neck down. ? 8.  DO NOT shower/wash with your normal soap after using and rinsing off the CHG Soap. ?               9.  Pat yourself dry with a clean towel. ?           10.  Wear clean pajamas. ?           11.  Place clean sheets on your bed the night of your first shower and do not  sleep with pets. ?Day of Surgery : ?Do not apply any lotions/deodorants the morning of surgery.  Please wear clean clothes to the hospital/surgery center. ? ?FAILURE TO FOLLOW THESE INSTRUCTIONS MAY RESULT IN THE CANCELLATION OF YOUR SURGERY ? ?PATIENT SIGNATURE_________________________________ ? ?NURSE SIGNATURE__________________________________ ? ?________________________________________________________________________  ? ?Incentive Spirometer ? ?An incentive spirometer is a tool that can help keep your lungs clear and active. This tool measures how well you are filling your lungs with each breath. Taking long deep breaths may help reverse or decrease the chance of developing breathing (pulmonary) problems (especially infection) following: ?A long period of time when you are unable to move or be active. ?BEFORE THE PROCEDURE  ?If the spirometer includes an indicator to show your best effort, your nurse or respiratory therapist will set it to a desired goal. ?If possible, sit up straight or lean slightly forward. Try not to slouch. ?Hold the incentive spirometer in an upright position. ?INSTRUCTIONS FOR USE  ?Sit on the edge of your bed if possible, or sit up as far as you can in bed or on a chair. ?Hold the incentive spirometer in an upright position. ?Breathe out normally. ?Place the mouthpiece in your mouth and seal your lips tightly around it. ?Breathe in slowly and as deeply as possible, raising the piston or the ball toward the top of the column. ?Hold your breath for 3-5 seconds or for as long  as possible. Allow the piston or ball to fall to the bottom of the column. ?Remove the mouthpiece from your mouth and breathe out normally. ?Rest for a few seconds and repeat Steps 1 through 7 at least 10 times ev

## 2021-05-13 NOTE — Progress Notes (Addendum)
Patient did not know medications and did not have a list. Pharmacy number given with instructions to call once home. ? ?COVID Vaccine Completed: no ?Date COVID Vaccine completed: ?Has received booster: ?COVID vaccine manufacturer: [place] [employer]  ? ?Date of COVID positive in last 90 days: no ? ?PCP - Allyn Kenner, MD ?Cardiologist - n/a ? ?Chest x-ray - 10/28/20 Epic ?EKG - 10/28/20 Epic ?Stress Test - n/a ?ECHO - n/a ?Cardiac Cath - n/a ?Pacemaker/ICD device last checked: n/a ?Spinal Cord Stimulator: n/a ? ?Bowel Prep - no solid food after 6pm night before ? ?Sleep Study - yes, not any more per pt ?CPAP -  ? ?Fasting Blood Sugar - n/a ?Checks Blood Sugar _____ times a day ? ?Blood Thinner Instructions: n/a ?Aspirin Instructions: ?Last Dose: ? ?Activity level: Can go up a flight of stairs and perform activities of daily living without stopping and without symptoms of chest pain or shortness of breath. ?   ?Anesthesia review:  ? ?Patient denies shortness of breath, fever, cough and chest pain at PAT appointment ? ? ?Patient verbalized understanding of instructions that were given to them at the PAT appointment. Patient was also instructed that they will need to review over the PAT instructions again at home before surgery.  ?

## 2021-05-17 ENCOUNTER — Encounter (HOSPITAL_COMMUNITY): Payer: Self-pay

## 2021-05-17 ENCOUNTER — Encounter (HOSPITAL_COMMUNITY)
Admission: RE | Admit: 2021-05-17 | Discharge: 2021-05-17 | Disposition: A | Discharge status: Home / Self Care | Payer: Medicare Other | Source: Ambulatory Visit | Attending: Surgery | Admitting: Surgery

## 2021-05-17 ENCOUNTER — Other Ambulatory Visit: Payer: Self-pay

## 2021-05-17 DIAGNOSIS — Z01812 Encounter for preprocedural laboratory examination: Secondary | ICD-10-CM | POA: Diagnosis not present

## 2021-05-17 DIAGNOSIS — Z01818 Encounter for other preprocedural examination: Secondary | ICD-10-CM

## 2021-05-17 LAB — CBC WITH DIFFERENTIAL/PLATELET
Abs Immature Granulocytes: 0.01 10*3/uL (ref 0.00–0.07)
Basophils Absolute: 0 10*3/uL (ref 0.0–0.1)
Basophils Relative: 1 %
Eosinophils Absolute: 0.4 10*3/uL (ref 0.0–0.5)
Eosinophils Relative: 6 %
HCT: 34.9 % — ABNORMAL LOW (ref 36.0–46.0)
Hemoglobin: 10.9 g/dL — ABNORMAL LOW (ref 12.0–15.0)
Immature Granulocytes: 0 %
Lymphocytes Relative: 38 %
Lymphs Abs: 2.3 10*3/uL (ref 0.7–4.0)
MCH: 29.6 pg (ref 26.0–34.0)
MCHC: 31.2 g/dL (ref 30.0–36.0)
MCV: 94.8 fL (ref 80.0–100.0)
Monocytes Absolute: 0.7 10*3/uL (ref 0.1–1.0)
Monocytes Relative: 11 %
Neutro Abs: 2.8 10*3/uL (ref 1.7–7.7)
Neutrophils Relative %: 44 %
Platelets: 334 10*3/uL (ref 150–400)
RBC: 3.68 MIL/uL — ABNORMAL LOW (ref 3.87–5.11)
RDW: 15.8 % — ABNORMAL HIGH (ref 11.5–15.5)
WBC: 6.2 10*3/uL (ref 4.0–10.5)
nRBC: 0 % (ref 0.0–0.2)

## 2021-05-17 LAB — TYPE AND SCREEN
ABO/RH(D): B POS
Antibody Screen: NEGATIVE

## 2021-05-17 LAB — COMPREHENSIVE METABOLIC PANEL
ALT: 21 U/L (ref 0–44)
AST: 23 U/L (ref 15–41)
Albumin: 3.8 g/dL (ref 3.5–5.0)
Alkaline Phosphatase: 113 U/L (ref 38–126)
Anion gap: 6 (ref 5–15)
BUN: 21 mg/dL — ABNORMAL HIGH (ref 6–20)
CO2: 29 mmol/L (ref 22–32)
Calcium: 9.5 mg/dL (ref 8.9–10.3)
Chloride: 108 mmol/L (ref 98–111)
Creatinine, Ser: 0.73 mg/dL (ref 0.44–1.00)
GFR, Estimated: >60 mL/min (ref >60)
Glucose, Bld: 96 mg/dL (ref 70–99)
Potassium: 4.6 mmol/L (ref 3.5–5.1)
Sodium: 143 mmol/L (ref 135–145)
Total Bilirubin: 0.4 mg/dL (ref 0.3–1.2)
Total Protein: 7.3 g/dL (ref 6.5–8.1)

## 2021-05-20 DIAGNOSIS — R42 Dizziness and giddiness: Secondary | ICD-10-CM | POA: Diagnosis not present

## 2021-05-20 DIAGNOSIS — H7292 Unspecified perforation of tympanic membrane, left ear: Secondary | ICD-10-CM | POA: Diagnosis not present

## 2021-05-20 DIAGNOSIS — H9192 Unspecified hearing loss, left ear: Secondary | ICD-10-CM | POA: Diagnosis not present

## 2021-05-28 HISTORY — PX: GASTRIC BYPASS: SHX52

## 2021-05-30 ENCOUNTER — Inpatient Hospital Stay (HOSPITAL_COMMUNITY): Payer: Medicare Other | Admitting: Certified Registered"

## 2021-05-30 ENCOUNTER — Encounter (HOSPITAL_COMMUNITY): Payer: Self-pay | Admitting: Surgery

## 2021-05-30 ENCOUNTER — Encounter (HOSPITAL_COMMUNITY)
Admission: RE | Disposition: A | Discharge status: Home / Self Care | Payer: Self-pay | Source: Ambulatory Visit | Attending: Surgery

## 2021-05-30 ENCOUNTER — Inpatient Hospital Stay (HOSPITAL_COMMUNITY)
Admission: RE | Admit: 2021-05-30 | Discharge: 2021-05-31 | DRG: 621 | Disposition: A | Discharge status: Home / Self Care | Payer: Medicare Other | Source: Ambulatory Visit | Attending: Surgery | Admitting: Surgery

## 2021-05-30 DIAGNOSIS — Z880 Allergy status to penicillin: Secondary | ICD-10-CM | POA: Diagnosis not present

## 2021-05-30 DIAGNOSIS — M1711 Unilateral primary osteoarthritis, right knee: Secondary | ICD-10-CM | POA: Diagnosis not present

## 2021-05-30 DIAGNOSIS — E039 Hypothyroidism, unspecified: Secondary | ICD-10-CM | POA: Diagnosis not present

## 2021-05-30 DIAGNOSIS — Z833 Family history of diabetes mellitus: Secondary | ICD-10-CM | POA: Diagnosis not present

## 2021-05-30 DIAGNOSIS — J45909 Unspecified asthma, uncomplicated: Secondary | ICD-10-CM | POA: Diagnosis present

## 2021-05-30 DIAGNOSIS — I10 Essential (primary) hypertension: Secondary | ICD-10-CM | POA: Diagnosis present

## 2021-05-30 DIAGNOSIS — Z79890 Hormone replacement therapy (postmenopausal): Secondary | ICD-10-CM

## 2021-05-30 DIAGNOSIS — G4730 Sleep apnea, unspecified: Secondary | ICD-10-CM | POA: Diagnosis not present

## 2021-05-30 DIAGNOSIS — Z6841 Body Mass Index (BMI) 40.0 and over, adult: Secondary | ICD-10-CM

## 2021-05-30 DIAGNOSIS — K660 Peritoneal adhesions (postprocedural) (postinfection): Secondary | ICD-10-CM | POA: Diagnosis present

## 2021-05-30 DIAGNOSIS — Z8051 Family history of malignant neoplasm of kidney: Secondary | ICD-10-CM

## 2021-05-30 DIAGNOSIS — Z01818 Encounter for other preprocedural examination: Secondary | ICD-10-CM

## 2021-05-30 DIAGNOSIS — Z79899 Other long term (current) drug therapy: Secondary | ICD-10-CM

## 2021-05-30 DIAGNOSIS — E785 Hyperlipidemia, unspecified: Secondary | ICD-10-CM | POA: Diagnosis not present

## 2021-05-30 DIAGNOSIS — K219 Gastro-esophageal reflux disease without esophagitis: Secondary | ICD-10-CM | POA: Diagnosis not present

## 2021-05-30 DIAGNOSIS — Z885 Allergy status to narcotic agent status: Secondary | ICD-10-CM | POA: Diagnosis not present

## 2021-05-30 DIAGNOSIS — Z8249 Family history of ischemic heart disease and other diseases of the circulatory system: Secondary | ICD-10-CM

## 2021-05-30 HISTORY — PX: UPPER GI ENDOSCOPY: SHX6162

## 2021-05-30 LAB — CBC
HCT: 36.3 % (ref 36.0–46.0)
Hemoglobin: 11.5 g/dL — ABNORMAL LOW (ref 12.0–15.0)
MCH: 28.8 pg (ref 26.0–34.0)
MCHC: 31.7 g/dL (ref 30.0–36.0)
MCV: 90.8 fL (ref 80.0–100.0)
Platelets: 380 10*3/uL (ref 150–400)
RBC: 4 MIL/uL (ref 3.87–5.11)
RDW: 15.5 % (ref 11.5–15.5)
WBC: 17.8 10*3/uL — ABNORMAL HIGH (ref 4.0–10.5)
nRBC: 0 % (ref 0.0–0.2)

## 2021-05-30 LAB — HEMOGLOBIN AND HEMATOCRIT, BLOOD
HCT: 34.8 % — ABNORMAL LOW (ref 36.0–46.0)
Hemoglobin: 11.2 g/dL — ABNORMAL LOW (ref 12.0–15.0)

## 2021-05-30 LAB — CREATININE, SERUM
Creatinine, Ser: 0.83 mg/dL (ref 0.44–1.00)
GFR, Estimated: >60 mL/min (ref >60)

## 2021-05-30 SURGERY — CREATION, GASTRIC BYPASS, ROUX-EN-Y, ROBOT-ASSISTED
Anesthesia: General

## 2021-05-30 MED ORDER — ACETAMINOPHEN 160 MG/5ML PO SOLN: 1000.0000 mg | Freq: Three times a day (TID) | ORAL | Status: DC

## 2021-05-30 MED ORDER — DEXAMETHASONE SODIUM PHOSPHATE 10 MG/ML IJ SOLN
INTRAMUSCULAR | Status: AC
  Filled 2021-05-30: qty 1

## 2021-05-30 MED ORDER — DEXAMETHASONE SODIUM PHOSPHATE 10 MG/ML IJ SOLN
INTRAMUSCULAR | Status: DC | PRN
Start: 2021-05-30 — End: 2021-05-30
  Administered 2021-05-30: 10 mg via INTRAVENOUS

## 2021-05-30 MED ORDER — PROPOFOL 10 MG/ML IV BOLUS
INTRAVENOUS | Status: AC
  Filled 2021-05-30: qty 20

## 2021-05-30 MED ORDER — FENTANYL CITRATE (PF) 250 MCG/5ML IJ SOLN
INTRAMUSCULAR | Status: DC | PRN
  Administered 2021-05-30: 100 ug via INTRAVENOUS

## 2021-05-30 MED ORDER — UMECLIDINIUM-VILANTEROL 62.5-25 MCG/ACT IN AEPB
1.0000 | INHALATION_SPRAY | Freq: Every day | RESPIRATORY_TRACT | Status: DC
  Administered 2021-05-31: 1 via RESPIRATORY_TRACT

## 2021-05-30 MED ORDER — ONDANSETRON HCL 4 MG/2ML IJ SOLN
INTRAMUSCULAR | Status: DC | PRN
Start: 2021-05-30 — End: 2021-05-30
  Administered 2021-05-30: 4 mg via INTRAVENOUS

## 2021-05-30 MED ORDER — BUSPIRONE HCL 5 MG PO TABS: 5.0000 mg | ORAL_TABLET | Freq: Two times a day (BID) | ORAL | Status: DC | PRN

## 2021-05-30 MED ORDER — ALBUTEROL SULFATE (2.5 MG/3ML) 0.083% IN NEBU: 2.5000 mg | INHALATION_SOLUTION | Freq: Two times a day (BID) | RESPIRATORY_TRACT | Status: DC | PRN

## 2021-05-30 MED ORDER — MIDAZOLAM HCL 2 MG/2ML IJ SOLN
INTRAMUSCULAR | Status: AC
  Filled 2021-05-30: qty 2

## 2021-05-30 MED ORDER — LACTATED RINGERS IV SOLN: INTRAVENOUS | Status: DC

## 2021-05-30 MED ORDER — ROSUVASTATIN CALCIUM 20 MG PO TABS
20.0000 mg | ORAL_TABLET | Freq: Every day | ORAL | Status: DC
  Administered 2021-05-31: 20 mg via ORAL
  Filled 2021-05-30: qty 1

## 2021-05-30 MED ORDER — LORATADINE 10 MG PO TABS
10.0000 mg | ORAL_TABLET | Freq: Every day | ORAL | Status: DC
  Administered 2021-05-30: 10 mg via ORAL
  Filled 2021-05-30: qty 1

## 2021-05-30 MED ORDER — ACETAMINOPHEN 10 MG/ML IV SOLN
INTRAVENOUS | Status: AC
  Administered 2021-05-30: 1000 mg
  Filled 2021-05-30: qty 100

## 2021-05-30 MED ORDER — ROCURONIUM BROMIDE 10 MG/ML (PF) SYRINGE
PREFILLED_SYRINGE | INTRAVENOUS | Status: DC | PRN
  Administered 2021-05-30: 10 mg via INTRAVENOUS
  Administered 2021-05-30: 20 mg via INTRAVENOUS
  Administered 2021-05-30: 100 mg via INTRAVENOUS

## 2021-05-30 MED ORDER — PROPOFOL 10 MG/ML IV BOLUS
INTRAVENOUS | Status: DC | PRN
  Administered 2021-05-30: 180 mg via INTRAVENOUS

## 2021-05-30 MED ORDER — LACTATED RINGERS IR SOLN
Status: DC | PRN
  Administered 2021-05-30: 1000 mL

## 2021-05-30 MED ORDER — LIDOCAINE 20MG/ML (2%) 15 ML SYRINGE OPTIME
INTRAMUSCULAR | Status: DC | PRN
  Administered 2021-05-30: 1.5 mg/kg/h via INTRAVENOUS

## 2021-05-30 MED ORDER — ONDANSETRON HCL 4 MG/2ML IJ SOLN
4.0000 mg | INTRAMUSCULAR | Status: DC | PRN
  Administered 2021-05-31: 4 mg via INTRAVENOUS
  Filled 2021-05-30: qty 2

## 2021-05-30 MED ORDER — ROCURONIUM BROMIDE 10 MG/ML (PF) SYRINGE
PREFILLED_SYRINGE | INTRAVENOUS | Status: AC
  Filled 2021-05-30: qty 10

## 2021-05-30 MED ORDER — BUPIVACAINE-EPINEPHRINE (PF) 0.25% -1:200000 IJ SOLN
INTRAMUSCULAR | Status: AC
  Filled 2021-05-30: qty 30

## 2021-05-30 MED ORDER — MONTELUKAST SODIUM 10 MG PO TABS: 10.0000 mg | ORAL_TABLET | Freq: Every evening | ORAL | Status: DC | PRN

## 2021-05-30 MED ORDER — TIZANIDINE HCL 4 MG PO TABS
2.0000 mg | ORAL_TABLET | Freq: Three times a day (TID) | ORAL | Status: DC
  Administered 2021-05-30 – 2021-05-31 (×2): 2 mg via ORAL
  Filled 2021-05-30 (×2): qty 1

## 2021-05-30 MED ORDER — MIDAZOLAM HCL 5 MG/5ML IJ SOLN
INTRAMUSCULAR | Status: DC | PRN
  Administered 2021-05-30 (×2): 1 mg via INTRAVENOUS

## 2021-05-30 MED ORDER — DULOXETINE HCL 60 MG PO CPEP
60.0000 mg | ORAL_CAPSULE | Freq: Two times a day (BID) | ORAL | Status: DC
  Administered 2021-05-30 – 2021-05-31 (×2): 60 mg via ORAL
  Filled 2021-05-30 (×2): qty 1

## 2021-05-30 MED ORDER — DEXMEDETOMIDINE (PRECEDEX) IN NS 20 MCG/5ML (4 MCG/ML) IV SYRINGE
PREFILLED_SYRINGE | INTRAVENOUS | Status: DC | PRN
  Administered 2021-05-30 (×4): 4 ug via INTRAVENOUS

## 2021-05-30 MED ORDER — KETAMINE HCL 10 MG/ML IJ SOLN
INTRAMUSCULAR | Status: DC | PRN
  Administered 2021-05-30: 20 mg via INTRAVENOUS
  Administered 2021-05-30 (×3): 10 mg via INTRAVENOUS

## 2021-05-30 MED ORDER — LOSARTAN POTASSIUM 50 MG PO TABS
100.0000 mg | ORAL_TABLET | Freq: Every day | ORAL | Status: DC
  Administered 2021-05-30 – 2021-05-31 (×2): 100 mg via ORAL
  Filled 2021-05-30 (×2): qty 2

## 2021-05-30 MED ORDER — SUGAMMADEX SODIUM 500 MG/5ML IV SOLN
INTRAVENOUS | Status: DC | PRN
  Administered 2021-05-30: 250 mg via INTRAVENOUS

## 2021-05-30 MED ORDER — BUPIVACAINE LIPOSOME 1.3 % IJ SUSP
INTRAMUSCULAR | Status: AC
  Filled 2021-05-30: qty 20

## 2021-05-30 MED ORDER — ALBUTEROL SULFATE HFA 108 (90 BASE) MCG/ACT IN AERS: 2.0000 | INHALATION_SPRAY | Freq: Two times a day (BID) | RESPIRATORY_TRACT | Status: DC | PRN

## 2021-05-30 MED ORDER — ORAL CARE MOUTH RINSE: 15.0000 mL | Freq: Once | OROMUCOSAL | Status: AC

## 2021-05-30 MED ORDER — ACETAMINOPHEN 500 MG PO TABS
1000.0000 mg | ORAL_TABLET | ORAL | Status: AC
  Administered 2021-05-30: 1000 mg via ORAL
  Filled 2021-05-30: qty 2

## 2021-05-30 MED ORDER — BUPIVACAINE-EPINEPHRINE 0.25% -1:200000 IJ SOLN
INTRAMUSCULAR | Status: DC | PRN
  Administered 2021-05-30: 30 mL

## 2021-05-30 MED ORDER — BUPROPION HCL ER (XL) 150 MG PO TB24
150.0000 mg | ORAL_TABLET | Freq: Two times a day (BID) | ORAL | Status: DC
  Administered 2021-05-30 – 2021-05-31 (×2): 150 mg via ORAL
  Filled 2021-05-30 (×2): qty 1

## 2021-05-30 MED ORDER — ONDANSETRON HCL 4 MG/2ML IJ SOLN
INTRAMUSCULAR | Status: AC
  Filled 2021-05-30: qty 2

## 2021-05-30 MED ORDER — ENOXAPARIN SODIUM 40 MG/0.4ML IJ SOSY
40.0000 mg | PREFILLED_SYRINGE | INTRAMUSCULAR | Status: AC
  Administered 2021-05-30: 40 mg via SUBCUTANEOUS
  Filled 2021-05-30: qty 0.4

## 2021-05-30 MED ORDER — LABETALOL HCL 5 MG/ML IV SOLN
INTRAVENOUS | Status: DC | PRN
  Administered 2021-05-30 (×3): 5 mg via INTRAVENOUS
  Administered 2021-05-30: 10 mg via INTRAVENOUS

## 2021-05-30 MED ORDER — LIDOCAINE HCL 2 % IJ SOLN
INTRAMUSCULAR | Status: AC
  Filled 2021-05-30: qty 20

## 2021-05-30 MED ORDER — PANTOPRAZOLE SODIUM 40 MG IV SOLR
40.0000 mg | Freq: Every day | INTRAVENOUS | Status: DC
  Administered 2021-05-30: 40 mg via INTRAVENOUS
  Filled 2021-05-30: qty 10

## 2021-05-30 MED ORDER — CHLORHEXIDINE GLUCONATE CLOTH 2 % EX PADS: 6.0000 | MEDICATED_PAD | Freq: Once | CUTANEOUS | Status: DC

## 2021-05-30 MED ORDER — ENOXAPARIN SODIUM 30 MG/0.3ML IJ SOSY
30.0000 mg | PREFILLED_SYRINGE | Freq: Two times a day (BID) | INTRAMUSCULAR | Status: DC
  Administered 2021-05-30 – 2021-05-31 (×2): 30 mg via SUBCUTANEOUS
  Filled 2021-05-30 (×2): qty 0.3

## 2021-05-30 MED ORDER — LABETALOL HCL 5 MG/ML IV SOLN
INTRAVENOUS | Status: AC
  Filled 2021-05-30: qty 4

## 2021-05-30 MED ORDER — STERILE WATER FOR IRRIGATION IR SOLN
Status: DC | PRN
  Administered 2021-05-30: 1000 mL
  Administered 2021-05-30: 500 mL

## 2021-05-30 MED ORDER — UMECLIDINIUM-VILANTEROL 62.5-25 MCG/ACT IN AEPB
1.0000 | INHALATION_SPRAY | Freq: Every day | RESPIRATORY_TRACT | Status: DC
  Filled 2021-05-30: qty 14

## 2021-05-30 MED ORDER — LEVOTHYROXINE SODIUM 112 MCG PO TABS
112.0000 ug | ORAL_TABLET | Freq: Every day | ORAL | Status: DC
  Administered 2021-05-31: 112 ug via ORAL
  Filled 2021-05-30: qty 1

## 2021-05-30 MED ORDER — KETOROLAC TROMETHAMINE 30 MG/ML IJ SOLN
30.0000 mg | Freq: Once | INTRAMUSCULAR | Status: AC | PRN
  Administered 2021-05-30: 30 mg via INTRAVENOUS

## 2021-05-30 MED ORDER — FENTANYL CITRATE (PF) 100 MCG/2ML IJ SOLN
INTRAMUSCULAR | Status: AC
  Filled 2021-05-30: qty 2

## 2021-05-30 MED ORDER — ACETAMINOPHEN 500 MG PO TABS
1000.0000 mg | ORAL_TABLET | Freq: Three times a day (TID) | ORAL | Status: DC
  Administered 2021-05-30 – 2021-05-31 (×2): 1000 mg via ORAL
  Filled 2021-05-30 (×3): qty 2

## 2021-05-30 MED ORDER — KETAMINE HCL 50 MG/5ML IJ SOSY
PREFILLED_SYRINGE | INTRAMUSCULAR | Status: AC
  Filled 2021-05-30: qty 5

## 2021-05-30 MED ORDER — SODIUM CHLORIDE 0.9 % IV SOLN
2.0000 g | INTRAVENOUS | Status: AC
  Administered 2021-05-30: 2 g via INTRAVENOUS
  Filled 2021-05-30: qty 2

## 2021-05-30 MED ORDER — LEVOCETIRIZINE DIHYDROCHLORIDE 5 MG PO TABS: 5.0000 mg | ORAL_TABLET | Freq: Every evening | ORAL | Status: DC

## 2021-05-30 MED ORDER — CHLORHEXIDINE GLUCONATE 0.12 % MT SOLN
15.0000 mL | Freq: Once | OROMUCOSAL | Status: AC
  Administered 2021-05-30: 15 mL via OROMUCOSAL

## 2021-05-30 MED ORDER — 0.9 % SODIUM CHLORIDE (POUR BTL) OPTIME
TOPICAL | Status: DC | PRN
Start: 2021-05-30 — End: 2021-05-30
  Administered 2021-05-30: 1000 mL

## 2021-05-30 MED ORDER — ONDANSETRON HCL 4 MG/2ML IJ SOLN: 4.0000 mg | Freq: Once | INTRAMUSCULAR | Status: DC | PRN

## 2021-05-30 MED ORDER — ENSURE MAX PROTEIN PO LIQD
2.0000 [oz_av] | ORAL | Status: DC
  Administered 2021-05-31 (×4): 2 [oz_av] via ORAL

## 2021-05-30 MED ORDER — AMLODIPINE BESYLATE 10 MG PO TABS
10.0000 mg | ORAL_TABLET | Freq: Every day | ORAL | Status: DC
Start: 2021-05-30 — End: 2021-05-31
  Administered 2021-05-30 – 2021-05-31 (×2): 10 mg via ORAL
  Filled 2021-05-30 (×2): qty 1

## 2021-05-30 MED ORDER — KETOROLAC TROMETHAMINE 30 MG/ML IJ SOLN
INTRAMUSCULAR | Status: AC
  Filled 2021-05-30: qty 1

## 2021-05-30 MED ORDER — OXYCODONE HCL 5 MG/5ML PO SOLN
5.0000 mg | Freq: Four times a day (QID) | ORAL | Status: DC | PRN
  Administered 2021-05-30: 5 mg via ORAL
  Filled 2021-05-30: qty 5

## 2021-05-30 MED ORDER — MORPHINE SULFATE (PF) 2 MG/ML IV SOLN: 1.0000 mg | INTRAVENOUS | Status: DC | PRN

## 2021-05-30 MED ORDER — SIMETHICONE 80 MG PO CHEW
80.0000 mg | CHEWABLE_TABLET | Freq: Four times a day (QID) | ORAL | Status: DC | PRN
  Administered 2021-05-31: 80 mg via ORAL
  Filled 2021-05-30: qty 1

## 2021-05-30 MED ORDER — BUPIVACAINE LIPOSOME 1.3 % IJ SUSP
INTRAMUSCULAR | Status: DC | PRN
  Administered 2021-05-30: 20 mL

## 2021-05-30 MED ORDER — LIDOCAINE 2% (20 MG/ML) 5 ML SYRINGE
INTRAMUSCULAR | Status: DC | PRN
  Administered 2021-05-30: 80 mg via INTRAVENOUS

## 2021-05-30 MED ORDER — APREPITANT 40 MG PO CAPS
40.0000 mg | ORAL_CAPSULE | ORAL | Status: AC
  Administered 2021-05-30: 40 mg via ORAL
  Filled 2021-05-30: qty 1

## 2021-05-30 MED ORDER — SUGAMMADEX SODIUM 500 MG/5ML IV SOLN
INTRAVENOUS | Status: AC
  Filled 2021-05-30: qty 5

## 2021-05-30 MED ORDER — HYDROMORPHONE HCL 1 MG/ML IJ SOLN: 0.2500 mg | INTRAMUSCULAR | Status: DC | PRN

## 2021-05-30 MED ORDER — OXYCODONE HCL 5 MG PO TABS: 5.0000 mg | ORAL_TABLET | Freq: Once | ORAL | Status: DC | PRN

## 2021-05-30 MED ORDER — OXYCODONE HCL 5 MG/5ML PO SOLN: 5.0000 mg | Freq: Once | ORAL | Status: DC | PRN

## 2021-05-30 SURGICAL SUPPLY — 82 items
ADH SKN CLS APL DERMABOND .7 (GAUZE/BANDAGES/DRESSINGS) ×1
APL PRP STRL LF DISP 70% ISPRP (MISCELLANEOUS) ×2
APPLIER CLIP 5 13 M/L LIGAMAX5 (MISCELLANEOUS)
APPLIER CLIP ROT 10 11.4 M/L (STAPLE)
APR CLP MED LRG 11.4X10 (STAPLE)
APR CLP MED LRG 5 ANG JAW (MISCELLANEOUS)
BLADE SURG SZ11 CARB STEEL (BLADE) ×2 IMPLANT
CANNULA REDUC XI 12-8 STAPL (CANNULA) ×2
CANNULA REDUCER 12-8 DVNC XI (CANNULA) ×1 IMPLANT
CHLORAPREP W/TINT 26 (MISCELLANEOUS) ×4 IMPLANT
CLIP APPLIE 5 13 M/L LIGAMAX5 (MISCELLANEOUS) IMPLANT
CLIP APPLIE ROT 10 11.4 M/L (STAPLE) IMPLANT
COVER SURGICAL LIGHT HANDLE (MISCELLANEOUS) ×2 IMPLANT
COVER TIP SHEARS 8 DVNC (MISCELLANEOUS) ×1 IMPLANT
COVER TIP SHEARS 8MM DA VINCI (MISCELLANEOUS) ×2
DERMABOND ADVANCED (GAUZE/BANDAGES/DRESSINGS) ×1
DERMABOND ADVANCED .7 DNX12 (GAUZE/BANDAGES/DRESSINGS) ×1 IMPLANT
DRAPE ARM DVNC X/XI (DISPOSABLE) ×4 IMPLANT
DRAPE COLUMN DVNC XI (DISPOSABLE) ×1 IMPLANT
DRAPE DA VINCI XI ARM (DISPOSABLE) ×8
DRAPE DA VINCI XI COLUMN (DISPOSABLE) ×2
ELECT REM PT RETURN 15FT ADLT (MISCELLANEOUS) ×2 IMPLANT
GAUZE 4X4 16PLY ~~LOC~~+RFID DBL (SPONGE) ×2 IMPLANT
GLOVE SURG ENC MOIS LTX SZ7.5 (GLOVE) ×4 IMPLANT
GLOVE SURG UNDER LTX SZ8 (GLOVE) ×4 IMPLANT
GOWN STRL REUS W/ TWL XL LVL3 (GOWN DISPOSABLE) ×3 IMPLANT
GOWN STRL REUS W/TWL XL LVL3 (GOWN DISPOSABLE) ×6
GRASPER SUT TROCAR 14GX15 (MISCELLANEOUS) ×2 IMPLANT
IRRIG SUCT STRYKERFLOW 2 WTIP (MISCELLANEOUS) ×2
IRRIGATION SUCT STRKRFLW 2 WTP (MISCELLANEOUS) ×1 IMPLANT
KIT BASIN OR (CUSTOM PROCEDURE TRAY) ×2 IMPLANT
KIT GASTRIC LAVAGE 34FR ADT (SET/KITS/TRAYS/PACK) ×2 IMPLANT
KIT TURNOVER KIT A (KITS) IMPLANT
LUBRICANT JELLY K Y 4OZ (MISCELLANEOUS) IMPLANT
MARKER SKIN DUAL TIP RULER LAB (MISCELLANEOUS) ×2 IMPLANT
MAT PREVALON FULL STRYKER (MISCELLANEOUS) ×2 IMPLANT
NDL SPNL 18GX3.5 QUINCKE PK (NEEDLE) ×1 IMPLANT
NEEDLE SPNL 18GX3.5 QUINCKE PK (NEEDLE) ×2 IMPLANT
OBTURATOR OPTICAL STANDARD 8MM (TROCAR) ×2
OBTURATOR OPTICAL STND 8 DVNC (TROCAR) ×1
OBTURATOR OPTICALSTD 8 DVNC (TROCAR) ×1 IMPLANT
PACK CARDIOVASCULAR III (CUSTOM PROCEDURE TRAY) ×2 IMPLANT
RELOAD STAPLE 60 2.5 WHT DVNC (STAPLE) ×1 IMPLANT
RELOAD STAPLE 60 3.5 BLU DVNC (STAPLE) IMPLANT
RELOAD STAPLER 2.5X60 WHT DVNC (STAPLE) ×9 IMPLANT
RELOAD STAPLER 3.5X60 BLU DVNC (STAPLE) IMPLANT
SCISSORS LAP 5X35 DISP (ENDOMECHANICALS) ×1 IMPLANT
SEAL CANN UNIV 5-8 DVNC XI (MISCELLANEOUS) ×3 IMPLANT
SEAL XI 5MM-8MM UNIVERSAL (MISCELLANEOUS) ×6
SEALER SYNCHRO 8 IS4000 DV (MISCELLANEOUS) ×2
SEALER SYNCHRO 8 IS4000 DVNC (MISCELLANEOUS) ×1 IMPLANT
SEALER VESSEL DA VINCI XI (MISCELLANEOUS) ×2
SEALER VESSEL EXT DVNC XI (MISCELLANEOUS) IMPLANT
SOL ANTI FOG 6CC (MISCELLANEOUS) ×1 IMPLANT
SOLUTION ANTI FOG 6CC (MISCELLANEOUS) ×1
SOLUTION ELECTROLUBE (MISCELLANEOUS) ×2 IMPLANT
SPIKE FLUID TRANSFER (MISCELLANEOUS) ×2 IMPLANT
STAPLER 60 DA VINCI SURE FORM (STAPLE) ×2
STAPLER 60 SUREFORM DVNC (STAPLE) ×1 IMPLANT
STAPLER CANNULA SEAL DVNC XI (STAPLE) ×1 IMPLANT
STAPLER CANNULA SEAL XI (STAPLE) ×2
STAPLER RELOAD 2.5X60 WHITE (STAPLE) ×18
STAPLER RELOAD 2.5X60 WHT DVNC (STAPLE) ×9
STAPLER RELOAD 3.5X60 BLU DVNC (STAPLE)
STAPLER RELOAD 3.5X60 BLUE (STAPLE)
SUT ETHIBOND 0 (SUTURE) ×3 IMPLANT
SUT MNCRL AB 4-0 PS2 18 (SUTURE) ×2 IMPLANT
SUT V-LOC BARB 180 2/0GR6 GS22 (SUTURE) ×4
SUT VIC AB 2-0 SH 27 (SUTURE) ×2
SUT VIC AB 2-0 SH 27XBRD (SUTURE) IMPLANT
SUT VICRYL 0 TIES 12 18 (SUTURE) ×2 IMPLANT
SUT VLOC BARB 180 ABS3/0GR12 (SUTURE) ×4
SUTURE V-LC BRB 180 2/0GR6GS22 (SUTURE) ×2 IMPLANT
SUTURE VLOC BRB 180 ABS3/0GR12 (SUTURE) ×2 IMPLANT
SYR 20ML LL LF (SYRINGE) ×2 IMPLANT
TOWEL OR 17X26 10 PK STRL BLUE (TOWEL DISPOSABLE) ×2 IMPLANT
TRAY FOLEY MTR SLVR 14FR STAT (SET/KITS/TRAYS/PACK) ×1 IMPLANT
TRAY FOLEY MTR SLVR 16FR STAT (SET/KITS/TRAYS/PACK) IMPLANT
TROCAR ADV FIXATION 12X100MM (TROCAR) ×2 IMPLANT
TROCAR Z-THREAD FIOS 5X100MM (TROCAR) ×2 IMPLANT
TUBE CALIBRATION LAPBAND (TUBING) IMPLANT
TUBING INSUFFLATION 10FT LAP (TUBING) ×2 IMPLANT

## 2021-05-30 NOTE — Discharge Instructions (Signed)
GASTRIC BYPASS / SLEEVE  ?Home Care Instructions ? ?These instructions are to help you care for yourself when you go home. ? ?Call: If you have any problems. ?Call [phone] and ask for the surgeon on call ?If you have an emergency related to your surgery please use the ER at [facility].  ?Tell the ER staff that you are a new post-op gastric bypass or gastric sleeve patient ?  ?Signs and symptoms to report: Severe vomiting or nausea ?If you cannot handle clear liquids for longer than 1 day, call your surgeon  ?Abdominal pain which does not get better after taking your pain medication ?Fever greater than 100.4? F and chills ?Heart rate over 100 beats a minute ?Trouble breathing ?Chest pain ? Redness, swelling, drainage, or foul odor at incision (surgical) sites ? If your incisions open or pull apart ?Swelling or pain in calf (lower leg) ?Diarrhea (Loose bowel movements that happen often), frequent watery, uncontrolled bowel movements ?Constipation, (no bowel movements for 3 days) if this happens:  ?Take Milk of Magnesia, 2 tablespoons by mouth, 3 times a day for 2 days if needed ?Stop taking Milk of Magnesia once you have had a bowel movement ?Call your doctor if constipation continues ?Or ?Take Miralax  (instead of Milk of Magnesia) following the label instructions ?Stop taking Miralax once you have had a bowel movement ?Call your doctor if constipation continues ?Anything you think is ?abnormal for you? ?  ?Normal side effects after surgery: Unable to sleep at night or unable to concentrate ?Irritability ?Being tearful (crying) or depressed ?These are common complaints, possibly related to your anesthesia, stress of surgery and change in lifestyle, that usually go away a few weeks after surgery.  If these feelings continue, call your medical doctor.  ?Wound Care: You may have surgical glue, steri-strips, or staples over your incisions after surgery ?Surgical glue:  Looks like a clear film over your incisions  and will wear off a little at a time ?Steri-strips : Adhesive strips of tape over your incisions. You may notice a yellowish color on the skin under the steri-strips. This is used to make the   steri-strips stick better. Do not pull the steri-strips off - let them fall off ?Staples: Staples may be removed before you leave the hospital ?If you go home with staples, call [facility] at for an appointment with your surgeon?s nurse to have staples removed 10 days after surgery, [phone] ?Showering: You may shower two (2) days after your surgery unless your surgeon tells you differently ?Wash gently around incisions with warm soapy water, rinse well, and gently pat dry  ?If you have a drain (tube from your incision), you may need someone to hold this while you shower  ?No tub baths until staples are removed and incisions are healed   ?  ?Medications: Medications should be liquid or crushed if larger than the size of a dime ?Extended release pills (medication that releases a little bit at a time through the day) should not be crushed ?Depending on the size and number of medications you take, you may need to space (take a few throughout the day)/change the time you take your medications so that you do not over-fill your pouch (smaller stomach) ?Make sure you follow-up with your primary care physician to make medication changes needed during rapid weight loss and life-style changes ?If you have diabetes, follow up with the doctor that orders your diabetes medication(s) within one week after surgery and check   your blood sugar regularly. ?Do not drive while taking narcotics (pain medications) ?DO NOT take NSAID'S (Examples of NSAID's include ibuprofen, naproxen)  ?Diet:                    First 2 Weeks ? You will see the nutritionist about two (2) weeks after your surgery. The nutritionist will increase the types of foods you can eat if you are handling liquids well: ?If you have severe vomiting or nausea  and cannot handle clear liquids lasting longer than 1 day, call your surgeon  ?Protein Shake ?Drink at least 2 ounces of shake 5-6 times per day ?Each serving of protein shakes (usually 8 - 12 ounces) should have a minimum of:  ?15 grams of protein  ?And no more than 5 grams of carbohydrate  ?Goal for protein each day: ?Men = 80 grams per day ?Women = 60 grams per day ?Protein powder may be added to fluids such as non-fat milk or Lactaid milk or Soy milk (limit to 35 grams added protein powder per serving) ? ?Hydration ?Slowly increase the amount of water and other clear liquids as tolerated (See Acceptable Fluids) ?Slowly increase the amount of protein shake as tolerated  ? Sip fluids slowly and throughout the day ?May use sugar substitutes in small amounts (no more than 6 - 8 packets per day; i.e. Splenda) ? ?Fluid Goal ?The first goal is to drink at least 8 ounces of protein shake/drink per day (or as directed by the nutritionist);  See handout from pre-op Bariatric Education Class for examples of protein shake/drink.   ?Slowly increase the amount of protein shake you drink as tolerated ?You may find it easier to slowly sip shakes throughout the day ?It is important to get your proteins in first ?Your fluid goal is to drink 64 - 100 ounces of fluid daily ?It may take a few weeks to build up to this ?32 oz (or more) should be clear liquids  ?And  ?32 oz (or more) should be full liquids (see below for examples) ?Liquids should not contain sugar, caffeine, or carbonation ? ?Clear Liquids: ?Water or Sugar-free flavored water (i.e. Fruit H2O, Propel) ?Decaffeinated coffee or tea (sugar-free) ?Sherran [place]olis Lite, [place]yler?s Lite, [place] Lite ?Sugar-free Jell-O ?Bouillon or broth ?Sugar-free Popsicle:   *Less than 20 calories each; Limit 1 per day ? ?Full Liquids: ?Protein Shakes/Drinks + 2 choices per day of other full liquids ?Full liquids must be: ?No More Than 12 grams of Carbs per serving  ?No More Than 3 grams of Fat  per serving ?Strained low-fat cream soup ?Non-Fat milk ?Fat-free Lactaid Milk ?Sugar-free yogurt (Dannon Lite & Fit, Greek yogurt) ? ? ? ?  ?Vitamins and Minerals Start 1 day after surgery unless otherwise directed by your surgeon ?Bariatric Specific Complete Multivitamins ?Chewable Calcium Citrate with Vitamin D-3 ?(Example: 3 Chewable Calcium Plus 600 with Vitamin D-3) ?Take 500 mg three (3) times a day for a total of 1500 mg each day ?Do not take all 3 doses of calcium at one time as it may cause constipation, and you can only absorb 500 mg  at a time  ?Do not mix multivitamins containing iron with calcium supplements; take 2 hours apart ? ?Menstruating women and those at risk for anemia (a blood disease that causes weakness) may need extra iron ?Talk with your doctor to see if you need more iron ?If you need extra iron: Total daily Iron recommendation (including Vitamins) is 50 to 100   mg Iron/day ?Do not stop taking or change any vitamins or minerals until you talk to your nutritionist or surgeon ?Your nutritionist and/or surgeon must approve all vitamin and mineral supplements ?  ?Activity and Exercise: It is important to continue walking at home.  Limit your physical activity as instructed by your doctor.  During this time, use these guidelines: ?Do not lift anything greater than ten (10) pounds for at least two (2) weeks ?Do not go back to work or drive until your surgeon says you can ?You may have sex when you feel comfortable  ?It is VERY important for female patients to use a reliable birth control method; fertility often increases after surgery  ?Do not get pregnant for at least 18 months ?Start exercising as soon as your doctor tells you that you can ?Make sure your doctor approves any physical activity ?Start with a simple walking program ?Walk 5-15 minutes each day, 7 days per week.  ?Slowly increase until you are walking 30-45 minutes per day ?Consider joining our BELT program. [phone] or email  [email or web address] ?  ?Special Instructions Things to remember: ? ?Use your CPAP when sleeping if this applies to you, do not stop the use of CPAP unless directed by physician after a sleep study ?[place]

## 2021-05-30 NOTE — Progress Notes (Signed)
PHARMACY CONSULT FOR:  Risk Assessment for Post-Discharge VTE Following Bariatric Surgery ? ?Post-Discharge VTE Risk Assessment: ?This patient's probability of 30-day post-discharge VTE is increased due to the factors marked: ? Sleeve gastrectomy  ? Liver disorder (transplant, cirrhosis, or nonalcoholic steatohepatitis)  ? Hx of VTE  ? Hemorrhage requiring transfusion  ? GI perforation, leak, or obstruction  ? ====================================================  ?  Female  ?  Age >/=60 years  ?  BMI >/=50 kg/m2  ?  CHF  ?  Dyspnea at Rest  ?  Paraplegia  ? x Non-gastric-band surgery  ?  Operation Time >/=3 hr  ?  Return to OR   ?  Length of Stay >/= 3 d  ? Hypercoagulable condition  ? Significant venous stasis  ? ? ? ? ?Predicted probability of 30-day post-discharge VTE: 0.16% ? ?Other patient-specific factors to consider: ? ? ?Recommendation for Discharge: ?No pharmacologic prophylaxis post-discharge ? ? ?Jessica Stevens is a 56 y.o. female who underwent ROBOTIC ROUX-EN-Y GASTRIC BYPASS, UPPER GI ENDOSCOPY on 05/30/2021 ?  ?Case start: 1324 ?Case end: 1615 ? ? ?Allergies  ?Allergen Reactions  ? Penicillins Other (See Comments)  ?  According to Allergy Test. ?  ? Oxycodone-Acetaminophen Rash  ?  Not allergic to tylenol  ? ? ?Patient Measurements: ?Height: 5' 4.5" (163.8 cm) ?Weight: 121.9 kg (268 lb 12.8 oz) ?IBW/kg (Calculated) : 55.85 ?Body mass index is 45.43 kg/m?. ? ?No results for input(s): WBC, HGB, HCT, PLT, APTT, CREATININE, LABCREA, CREATININE, CREAT24HRUR, MG, PHOS, ALBUMIN, PROT, ALBUMIN, AST, ALT, ALKPHOS, BILITOT, BILIDIR, IBILI in the last 72 hours. ?Estimated Creatinine Clearance: 102 mL/min (by C-G formula based on SCr of 0.73 mg/dL). ? ? ? ?Past Medical History:  ?Diagnosis Date  ? Allergy   ? Anemia   ? Ankle pain, left   ? Anxiety   ? Arthritis of knee, degenerative   ? Asthma   ? Carpal tunnel syndrome   ? Cervical radiculopathy   ? Chronic back pain   ? Complete rupture of rotator cuff   ?  Complete rupture of rotator cuff   ? Depression   ? GERD (gastroesophageal reflux disease)   ? Gynecomastia   ? HNP (herniated nucleus pulposus), cervical   ? HNP (herniated nucleus pulposus), cervical   ? Hyperlipidemia   ? Hypertension   ? Hypothyroidism   ? Knee pain   ? OA (osteoarthritis) of knee   ? Obesity   ? Sleep apnea   ? hx of   ? Urinary incontinence   ? ? ? ?Medications Prior to Admission  ?Medication Sig Dispense Refill Last Dose  ? albuterol (PROVENTIL HFA;VENTOLIN HFA) 108 (90 BASE) MCG/ACT inhaler Inhale 2 puffs into the lungs 2 (two) times daily as needed for wheezing or shortness of breath.   05/30/2021 at 0800  ? amLODipine (NORVASC) 10 MG tablet Take 10 mg by mouth daily.   05/29/2021 at 2200  ? ANORO ELLIPTA 62.5-25 MCG/INH AEPB Inhale 1 puff into the lungs daily.   05/28/2021  ? aspirin-acetaminophen-caffeine (EXCEDRIN MIGRAINE) 250-250-65 MG tablet Take 1 tablet by mouth every 6 (six) hours as needed for headache or migraine.   05/25/2021  ? buPROPion (WELLBUTRIN XL) 150 MG 24 hr tablet Take 150 mg by mouth 2 (two) times daily.   05/29/2021 at 0900  ? busPIRone (BUSPAR) 5 MG tablet Take 5 mg by mouth 2 (two) times daily as needed (stress).   05/30/2021 at 0800  ? DULoxetine (CYMBALTA) 60 MG capsule Take  60 mg by mouth 2 (two) times daily.   05/30/2021 at 0800  ? HYDROcodone-acetaminophen (NORCO) 10-325 MG tablet Take 1-2 tablets by mouth 3 (three) times daily as needed for moderate pain or severe pain.   05/30/2021 at 0800  ? levocetirizine (XYZAL) 5 MG tablet Take 5 mg by mouth every evening.   05/29/2021 at 2200  ? levothyroxine (SYNTHROID) 112 MCG tablet Take 112 mcg by mouth daily before breakfast.   05/30/2021 at 0800  ? losartan (COZAAR) 100 MG tablet Take 100 mg by mouth daily.   05/29/2021  ? montelukast (SINGULAIR) 10 MG tablet Take 10 mg by mouth at bedtime as needed (allergies).   05/29/2021  ? Multiple Vitamins-Minerals (AIRBORNE PO) Take 1 tablet by mouth in the morning and at bedtime.   05/29/2021  ?  Multiple Vitamins-Minerals (AIRBORNE) CHEW Chew by mouth.     ? Multiple Vitamins-Minerals (MULTIVITAMIN ADULTS) TABS Take 1 tablet by mouth daily.   Past Month  ? pantoprazole (PROTONIX) 40 MG tablet Take 40 mg by mouth daily.   05/30/2021 at 0800  ? phentermine (ADIPEX-P) 37.5 MG tablet Take 37.5 mg by mouth daily before breakfast.   Past Week  ? potassium chloride (MICRO-K) 10 MEQ CR capsule Take 10 mEq by mouth daily.   05/29/2021  ? rosuvastatin (CRESTOR) 20 MG tablet Take 20 mg by mouth daily.   05/18/2021 at 0900  ? tiZANidine (ZANAFLEX) 2 MG tablet Take 2 mg by mouth 3 (three) times daily.   05/29/2021 at 2200  ? diclofenac Sodium (VOLTAREN) 1 % GEL Apply 2 g topically 2 (two) times daily as needed (pain).   More than a month  ? fluticasone (FLONASE) 50 MCG/ACT nasal spray Place 1 spray into both nostrils daily for 10 days. 9.9 mL 0   ? furosemide (LASIX) 20 MG tablet Take 20 mg by mouth daily.   More than a month  ? HYDROcodone-acetaminophen (NORCO) 5-325 MG tablet Take 1-2 tablets by mouth every 6 (six) hours as needed. (Patient not taking: Reported on 05/18/2021) 12 tablet 0 Completed Course  ? tiZANidine (ZANAFLEX) 4 MG tablet Take 1 tablet (4 mg total) by mouth every 8 (eight) hours as needed for muscle spasms. (Patient not taking: Reported on 05/18/2021) 30 tablet 1 More than a month  ? ? ?Peggyann Juba, PharmD, BCPS ?Pharmacy: [phone] ?05/30/2021,4:33 PM ? ?

## 2021-05-30 NOTE — H&P (Signed)
? ?Admitting Physician: Nickola Major Trooper Olander ? ?Service: Bariatric surgery ? ?CC: Obesity ? ?Subjective  ? ?HPI: ?Jessica Stevens is an 56 y.o. female who is here for elective robotic gastric bypass. ? ?Past Medical History:  ?Diagnosis Date  ? Allergy   ? Anemia   ? Ankle pain, left   ? Anxiety   ? Arthritis of knee, degenerative   ? Asthma   ? Carpal tunnel syndrome   ? Cervical radiculopathy   ? Chronic back pain   ? Complete rupture of rotator cuff   ? Complete rupture of rotator cuff   ? Depression   ? GERD (gastroesophageal reflux disease)   ? Gynecomastia   ? HNP (herniated nucleus pulposus), cervical   ? HNP (herniated nucleus pulposus), cervical   ? Hyperlipidemia   ? Hypertension   ? Hypothyroidism   ? Knee pain   ? OA (osteoarthritis) of knee   ? Obesity   ? Sleep apnea   ? hx of   ? Urinary incontinence   ? ? ?Past Surgical History:  ?Procedure Laterality Date  ? arthroscopy  left knee  08/14/2008  ? Dr. Aline Brochure   ? BIOPSY  09/20/2020  ? Procedure: BIOPSY;  Surgeon: Doran Stabler, MD;  Location: [facility];  Service: Gastroenterology;;  ? COLONOSCOPY N/A 03/11/2014  ? Procedure: COLONOSCOPY;  Surgeon: Rogene Houston, MD;  Location: [facility];  Service: Endoscopy;  Laterality: N/A;  730 - moved to 1/13 @ 12:00  ? COLONOSCOPY    ? ESOPHAGOGASTRODUODENOSCOPY (EGD) WITH PROPOFOL N/A 09/20/2020  ? Procedure: ESOPHAGOGASTRODUODENOSCOPY (EGD) WITH PROPOFOL;  Surgeon: Doran Stabler, MD;  Location: [facility];  Service: Gastroenterology;  Laterality: N/A;  ? PARTIAL HYSTERECTOMY    ? POLYPECTOMY    ? REVERSE SHOULDER ARTHROPLASTY Right 05/27/2020  ? Procedure: REVERSE SHOULDER ARTHROPLASTY;  Surgeon: Tania Ade, MD;  Location: [facility];  Service: Orthopedics;  Laterality: Right;  ? tube insert in right ear  02/27/2001  ? ? ?Family History  ?Problem Relation Age of Onset  ? Diabetes Mother   ? Hypertension Mother   ? Cancer Father   ?     left nephrectomy   ? Hypertension Father   ?  Hypertension Sister   ? Diabetes Maternal Grandmother   ? Hypertension Maternal Grandmother   ? Hypertension Maternal Grandfather   ? Colon cancer Neg Hx   ? Colon polyps Neg Hx   ? Esophageal cancer Neg Hx   ? Rectal cancer Neg Hx   ? Stomach cancer Neg Hx   ? ? ?Social:  reports that she has never smoked. She has never used smokeless tobacco. She reports current alcohol use. She reports current drug use. Drug: Marijuana. ? ?Allergies:  ?Allergies  ?Allergen Reactions  ? Penicillins Other (See Comments)  ?  According to Allergy Test. ?  ? Oxycodone-Acetaminophen Rash  ?  Not allergic to tylenol  ? ? ?Medications: ?Current Outpatient Medications  ?Medication Instructions  ? albuterol (PROVENTIL HFA;VENTOLIN HFA) 108 (90 BASE) MCG/ACT inhaler 2 puffs, Inhalation, 2 times daily PRN  ? amLODipine (NORVASC) 10 mg, Oral, Daily  ? ANORO ELLIPTA 62.5-25 MCG/INH AEPB 1 puff, Inhalation, Daily  ? aspirin-acetaminophen-caffeine (EXCEDRIN MIGRAINE) 250-250-65 MG tablet 1 tablet, Oral, Every 6 hours PRN  ? buPROPion (WELLBUTRIN XL) 150 mg, Oral, 2 times daily  ? busPIRone (BUSPAR) 5 mg, Oral, 2 times daily PRN  ? diclofenac Sodium (VOLTAREN) 2 g, Topical, 2 times daily PRN  ?  DULoxetine (CYMBALTA) 60 mg, Oral, 2 times daily  ? fluticasone (FLONASE) 50 MCG/ACT nasal spray 1 spray, Each Nare, Daily  ? furosemide (LASIX) 20 mg, Oral, Daily  ? HYDROcodone-acetaminophen (NORCO) 10-325 MG tablet 1-2 tablets, Oral, 3 times daily PRN  ? HYDROcodone-acetaminophen (NORCO) 5-325 MG tablet 1-2 tablets, Oral, Every 6 hours PRN  ? levocetirizine (XYZAL) 5 mg, Oral, Every evening  ? levothyroxine (SYNTHROID) 112 mcg, Oral, Daily before breakfast  ? losartan (COZAAR) 100 mg, Oral, Daily  ? montelukast (SINGULAIR) 10 mg, Oral, At bedtime PRN  ? Multiple Vitamins-Minerals (AIRBORNE PO) 1 tablet, Oral, 2 times daily  ? Multiple Vitamins-Minerals (AIRBORNE) CHEW Oral  ? Multiple Vitamins-Minerals (MULTIVITAMIN ADULTS) TABS 1 tablet, Oral, Daily  ?  pantoprazole (PROTONIX) 40 mg, Oral, Daily  ? phentermine (ADIPEX-P) 37.5 mg, Oral, Daily before breakfast  ? potassium chloride (MICRO-K) 10 MEQ CR capsule 10 mEq, Oral, Daily  ? rosuvastatin (CRESTOR) 20 mg, Oral, Daily  ? tiZANidine (ZANAFLEX) 4 mg, Oral, Every 8 hours PRN  ? tiZANidine (ZANAFLEX) 2 mg, Oral, 3 times daily  ? ? ?ROS - all of the below systems have been reviewed with the patient and positives are indicated with bold text ?General: chills, fever or night sweats ?Eyes: blurry vision or double vision ?ENT: epistaxis or sore throat ?Allergy/Immunology: itchy/watery eyes or nasal congestion ?Hematologic/Lymphatic: bleeding problems, blood clots or swollen lymph nodes ?Endocrine: temperature intolerance or unexpected weight changes ?Breast: new or changing breast lumps or nipple discharge ?Resp: cough, shortness of breath, or wheezing ?CV: chest pain or dyspnea on exertion ?GI: as per HPI ?GU: dysuria, trouble voiding, or hematuria ?MSK: joint pain or joint stiffness ?Neuro: TIA or stroke symptoms ?Derm: pruritus and skin lesion changes ?Psych: anxiety and depression ? ?Objective  ? ?PE ?Blood pressure (!) 160/100, pulse 81, temperature 98.4 ?F (36.9 ?C), temperature source Oral, resp. rate 18, height 5' 4.5" (1.638 m), weight 121.9 kg, SpO2 97 %. ?Constitutional: NAD; conversant; no deformities ?Eyes: Moist conjunctiva; no lid lag; anicteric; PERRL ?Neck: Trachea midline; no thyromegaly ?Lungs: Normal respiratory effort; no tactile fremitus ?CV: RRR; no palpable thrills; no pitting edema ?GI: Abd Soft, nontender; no palpable hepatosplenomegaly ?MSK: Normal range of motion of extremities; no clubbing/cyanosis ?Psychiatric: Appropriate affect; alert and oriented x3 ?Lymphatic: No palpable cervical or axillary lymphadenopathy ? ?No results found for this or any previous visit (from the past 24 hour(s)). ? ?Imaging Orders  ?No imaging studies ordered today  ?EGD w/ Dr. Loletha Carrow 09/20/20 ?- Normal esophagus. ?-  Normal stomach. Biopsied. ?- Normal examined duodenum. ?Path: ?- Mild reactive gastropathy.  ?- Warthin-Starry is negative for Helicobacter pylori.  ?- No intestinal metaplasia, dysplasia, or malignancy.  ? ? ?Assessment and Plan  ? ?Jessica Stevens is a 56 y.o. female who is seen originally on 05/16/20, for initial bariatric surgery consultation. The patient has morbid obesity with a BMI of Body mass index is 45.43 kg/m?Marland Kitchen and the following conditions related to obesity: Hypertension, hyperlipidemia, gastroesophageal reflux disease, and arthritis. ? ?We discussed the surgical options to treat obesity and its associated comorbidity. After discussing the available procedures in the region, we discussed in great detail the surgeries I offer: robotic sleeve gastrectomy and robotic roux-en-y gastric bypass. We discussed the procedures themselves as well as their risks, benefits and alternatives. I entered the patient's basic information into the [facility] Metabolic Surgery Risk/Benefit Calculator to facilitate this discussion.  ? ?After a full discussion and all questions answered, the patient is interested in pursuing  a robotic gastric bypass with upper endoscopy. ? ?She completed the preoperative pathway to include the following: ?- Bloodwork ?- Dietician consult - Completed with Sandie Ano, RD and Berlinda Last, RD. ?- Chest x-ray - No radiographic evidence of acute cardiopulmonary disease. 10/28/20 ?- EKG - NSR 10/28/20 ?- Psychology evaluation ?- Upper endoscopy with biopsy. (see above) ? ?We reviewed the risks, benefits and alternatives of surgery and the patient again consented to proceed. We will proceed as scheduled.  ? ? ? ?Felicie Morn, MD ? ?[facility] ?Use [email or web address] to contact on call provider ? ? ? ?

## 2021-05-30 NOTE — Op Note (Signed)
? ?Patient: Jessica Stevens ([birth year], [id number]) ? ?Date of Surgery: 05/30/2021  ? ?Preoperative Diagnosis: MORBID OBESITY  ? ?Postoperative Diagnosis: MORBID OBESITY  ? ?Surgical Procedure: ROBOTIC ROUX-EN-Y GASTRIC BYPASS: 43644 (CPT?) ?UPPER GI ENDOSCOPY: [id number]  ? ?Operative Team Members:  ?Surgeon(s) and Role: ?   * Marquette Blodgett, Hyman Hopes, MD - Primary ?   Luretha Murphy, MD - Assisting  ? ?Anesthesiologist: Trevor Iha, MD; Nance Pew Nelle Don, DO ?CRNA: Basilio Cairo, CRNA; Williford, Peggy D, CRNA  ? ?Anesthesia: General  ? ?Fluids:  ?Total I/O ?In: 1000 [I.V.:1000] ?Out: 600 [Urine:550; Blood:50] ? ?Complications: None ? ?Drains:  none  ? ?Specimen: None ? ?Disposition:  PACU - hemodynamically stable. ? ?Plan of Care: Admit to inpatient  ? ? ? ?Indications for Procedure: [name] is a 56 y.o. female who is seen originally on 05/16/20, for initial bariatric surgery consultation. The patient has morbid obesity with a BMI of Body mass index is 45.43 kg/m?Marland Kitchen and the following conditions related to obesity: Hypertension, hyperlipidemia, gastroesophageal reflux disease, and arthritis. ? ?We discussed the surgical options to treat obesity and its associated comorbidity. After discussing the available procedures in the region, we discussed in great detail the surgeries I offer: robotic sleeve gastrectomy and robotic roux-en-y gastric bypass. We discussed the procedures themselves as well as their risks, benefits and alternatives. I entered the patient's basic information into the [facility] Metabolic Surgery Risk/Benefit Calculator to facilitate this discussion.  ? ?After a full discussion and all questions answered, the patient is interested in pursuing a robotic gastric bypass with upper endoscopy. ? ?She completed the preoperative pathway to include the following: ?- Bloodwork ?- Dietician consult - Completed with Serena Colonel, RD and Osvaldo Human, RD. ?- Chest x-ray - No radiographic  evidence of acute cardiopulmonary disease. 10/28/20 ?- EKG - NSR 10/28/20 ?- Psychology evaluation ?- Upper endoscopy with biopsy ? ?We reviewed the risks, benefits and alternatives of surgery and the patient again consented to proceed. We will proceed as scheduled.  ?  ?  ? ? ?Findings: Dense adhesions between omentum and pelvis  ? ?Infection status: ?Patient: Private Patient Elective Case ?Case: Elective ?Infection Present At Time Of Surgery (PATOS): Some spillage of foregut  and jejunal contents while creating anastomoses ? ? ?Description of Procedure:  ? ?On the date stated above, the patient was taken to the operating room suite and placed in supine positioning.  General endotracheal anesthesia was induced.  A timeout was completed verifying the correct patient, procedure, positioning and equipment needed for the case.  The patient's abdomen was prepped and draped in the usual sterile fashion.  A 5 mm trocar was used to enter the right upper quadrant using optical technique.  The abdomen was entered safely without any trauma the underlying viscera.  Three additional incisions were made and 4 robotic trochars were placed across the abdomen, replacing the 5 mm trocar with the 12 mm robotic stapler trocar. An 8mm assistant trocar was placed in the left lower quadrant.    Adhesions between the omentum and the previous pelvis surgery were lysed bluntly.  This created some bleeding which was controlled during the omental division portion of the case.  The [facility] liver retractor was placed through the subxiphoid region and under the left lobe of the liver and was connected to the rail of the bed.  A TAP block was placed using marcaine and Exparel under direct vision of the laparoscope.  The [employer] XI robotic platform  was docked and we transitioned to robotic surgery. ? ?Using the tip up grasper, fenestrated bipolar, 30 degree camera and Vessel sealer from the patient's right to left, we began by dissecting the angle  of His off the left crus of the diaphragm.  The adhesions between the stomach, spleen and diaphragm were divided using the Synchroseal to define the angle of His.  I then started 4-6 cm down on the lesser curve of the stomach and created a defect in the gastrohepatic ligament tracking behind the lesser curve of the stomach to enter the lesser sac.  I then used multiple white loads of the robotic 60 mm Sureform linear stapler to form the gastric pouch.  I then directed my attention to the lower abdomen.  The omentum was divided with the vessel sealer.  The omentum was lifted off the transverse colon and bleeding from the omental adhesions to the pelvis was controlled using the vessel sealer.I then identified the ligament of treitz.  The jejunum was run to a point 50 cm from the ligament of Treitz.  This loop of bowel was then brought into the left upper quadrant, over the transverse colon, between the split omentum.  A 2-0 v-loc suture was used to create the posterior outer row of the gastrojejunal anastomosis.  An approximately 2 cm gastrotomy was made in the pouch and a matching 2 cm enterotomy was created in the roux limb.  Then, two 3-0 v-loc sutures were used to create a posterior, inner, full thickness layer of the anastomosis.  While sewing the anterior inner layer, the Ewald tube was passed through the anastomosis to ensure patency.  The outer, anterior layer was then created using 2-0 v-loc suture.  A window was made in the jejunal mesentery and the jejunum was divided just proximal to the gastrojejunal anastomosis using a white load of the robotic 60 mm Sureform linear stapler to divide the roux limb from the hepatobiliary limb.  At this point the gastrojejunal anastomosis was complete and the Ewald tube was removed. ? ?The roux limb was then run 100 cm from the gastrojejunal anastomosis.  This loop of bowel was brought into the left upper quadrant for anastomosis to the hepatobiliary limb.  A robotic 60 mm  white load on the Sureform linear stapler was introduced into both limbs and fired to create the jejunojejunostomy.  A second 60 mm white load of the Sureform linear stapler was fired to connect the distal Roux limb to the proximal common channel creating a W shaped anastomosis.  The common enterotomy was closed with a final 60 mm white load of the Sureform linear stapler.  The jejunojejunostomy mesenteric defect was closed using running 0-Ethibond suture.   The retro-roux defect was closed, closing the roux limb mesentery to the transverse mesocolon mesentery using a running 0-Ethibond suture.  ? ?I ran the roux limb from the gastrojejunal anastomosis to the jejunojejunostomy and found the anatomy as expected without any twisting of the mesentery.  I then transitioned to endoscopic portion of the procedure.  The adult upper endoscope was inserted into the pouch.  The pouch appeared appropriately sized.  There was good intra-luminal hemostasis.  The endoscope was inserted through the anastomosis into the roux limb.  The anastomosis appeared well formed without any stricture.  The anastomosis was submerged in irrigation in the left upper quadrant and there was no leakage of air bubbles with endoscopic insufflation suggesting a negative leak test and an air tight anastomosis.  The foregut was decompressed with the endoscope and the endoscope was removed.  The robotic instruments were removed and the robot was undocked.  The stapler port in the right abdomen was closed at the fascial level using 0-vicryl on a PMI suture passer.  The liver retractor was removed under direct vision.  The pneumoperitoneum was evacuated.  The skin was closed using 4-0 Monocryl and Dermabond.  All sponge and needle counts were correct at the end of the case. ? ? ? ?Ivar Drape, MD ?General, Bariatric, & Minimally Invasive Surgery ?[facility], [place] ? ?

## 2021-05-30 NOTE — Anesthesia Procedure Notes (Signed)
Procedure Name: Intubation ?Date/Time: 05/30/2021 12:54 PM ?Performed by: Burdette Forehand D, CRNA ?Pre-anesthesia Checklist: Patient identified, Emergency Drugs available, Suction available and Patient being monitored ?Patient Re-evaluated:Patient Re-evaluated prior to induction ?Oxygen Delivery Method: Circle system utilized ?Preoxygenation: Pre-oxygenation with 100% oxygen ?Induction Type: IV induction ?Ventilation: Mask ventilation without difficulty ?Laryngoscope Size: Mac and 4 ?Grade View: Grade I ?Tube type: Oral ?Number of attempts: 1 ?Airway Equipment and Method: Stylet ?Placement Confirmation: ETT inserted through vocal cords under direct vision, positive ETCO2 and breath sounds checked- equal and bilateral ?Secured at: 22 cm ?Tube secured with: Tape ?Dental Injury: Teeth and Oropharynx as per pre-operative assessment  ? ? ? ? ?

## 2021-05-30 NOTE — Anesthesia Preprocedure Evaluation (Addendum)
Anesthesia Evaluation  ?Patient identified by MRN, date of birth, ID band ?Patient awake ? ? ? ?Reviewed: ?Allergy & Precautions, NPO status , Patient's Chart, lab work & pertinent test results ? ?Airway ?Mallampati: II ? ?TM Distance: >3 FB ?Neck ROM: Full ? ? ? Dental ?no notable dental hx. ?(+) Missing, Dental Advisory Given,  ?  ?Pulmonary ?asthma , sleep apnea ,  ?  ?Pulmonary exam normal ?breath sounds clear to auscultation ? ? ? ? ? ? Cardiovascular ?hypertension, Pt. on medications ?Normal cardiovascular exam ?Rhythm:Regular Rate:Normal ? ? ?  ?Neuro/Psych ? Headaches,   ? GI/Hepatic ?GERD  ,  ?Endo/Other  ?Hypothyroidism Morbid obesity (BMI 45.4) ? Renal/GU ?Lab Results ?     Component                Value               Date                 ?     CREATININE               0.73                05/17/2021           ?     K                        4.6                 05/17/2021           ?     ? ?  ?Musculoskeletal ? ?(+) Arthritis ,  ? Abdominal ?(+) + obese,   ?Peds ? Hematology ?Lab Results ?     Component                Value               Date                      ?     HGB                      10.9 (L)            05/17/2021           ?     PLT                      334                 05/17/2021           ?   ?Anesthesia Other Findings ? ? Reproductive/Obstetrics ? ?  ? ? ? ? ? ? ? ? ? ? ? ? ? ?  ?  ? ? ? ? ? ? ? ?Anesthesia Physical ?Anesthesia Plan ? ?ASA: 3 ? ?Anesthesia Plan: General  ? ?Post-op Pain Management: Lidocaine infusion* and Ketamine IV*  ? ?Induction: Intravenous ? ?PONV Risk Score and Plan: 4 or greater and Treatment may vary due to age or medical condition, Ondansetron, Midazolam and Dexamethasone ? ?Airway Management Planned: Oral ETT ? ?Additional Equipment: None ? ?Intra-op Plan:  ? ?Post-operative Plan: Extubation in OR ? ?Informed Consent: I have reviewed the patients History and Physical, chart, labs and discussed the procedure including the risks,  benefits and alternatives for the proposed anesthesia with the  patient or authorized representative who has indicated his/her understanding and acceptance.  ? ? ? ?Dental advisory given ? ?Plan Discussed with: CRNA and Anesthesiologist ? ?Anesthesia Plan Comments:   ? ? ? ? ? ?Anesthesia Quick Evaluation ? ?

## 2021-05-30 NOTE — Transfer of Care (Signed)
Immediate Anesthesia Transfer of Care Note ? ?Patient: Jessica Stevens ? ?Procedure(s) Performed: ROBOTIC ROUX-EN-Y GASTRIC BYPASS ?UPPER GI ENDOSCOPY ? ?Patient Location: PACU ? ?Anesthesia Type:General ? ?Level of Consciousness: awake, alert  and oriented ? ?Airway & Oxygen Therapy: Patient Spontanous Breathing and Patient connected to face mask oxygen ? ?Post-op Assessment: Report given to RN and Post -op Vital signs reviewed and stable ? ?Post vital signs: Reviewed and stable ? ?Last Vitals:  ?Vitals Value Taken Time  ?BP 164/113 05/30/21 1626  ?Temp    ?Pulse 58 05/30/21 1629  ?Resp 15 05/30/21 1629  ?SpO2 100 % 05/30/21 1629  ?Vitals shown include unvalidated device data. ? ?Last Pain:  ?Vitals:  ? 05/30/21 1110  ?TempSrc: Oral  ?PainSc: 9   ?   ? ?  ? ?Complications: No notable events documented. ?

## 2021-05-31 ENCOUNTER — Encounter (HOSPITAL_COMMUNITY): Payer: Self-pay | Admitting: Surgery

## 2021-05-31 ENCOUNTER — Other Ambulatory Visit: Payer: Self-pay

## 2021-05-31 LAB — CBC WITH DIFFERENTIAL/PLATELET
Abs Immature Granulocytes: 0.25 10*3/uL — ABNORMAL HIGH (ref 0.00–0.07)
Basophils Absolute: 0 10*3/uL (ref 0.0–0.1)
Basophils Relative: 0 %
Eosinophils Absolute: 0 10*3/uL (ref 0.0–0.5)
Eosinophils Relative: 0 %
HCT: 30.1 % — ABNORMAL LOW (ref 36.0–46.0)
Hemoglobin: 9.8 g/dL — ABNORMAL LOW (ref 12.0–15.0)
Immature Granulocytes: 2 %
Lymphocytes Relative: 9 %
Lymphs Abs: 1.2 10*3/uL (ref 0.7–4.0)
MCH: 29 pg (ref 26.0–34.0)
MCHC: 32.6 g/dL (ref 30.0–36.0)
MCV: 89.1 fL (ref 80.0–100.0)
Monocytes Absolute: 0.6 10*3/uL (ref 0.1–1.0)
Monocytes Relative: 5 %
Neutro Abs: 10.8 10*3/uL — ABNORMAL HIGH (ref 1.7–7.7)
Neutrophils Relative %: 84 %
Platelets: 314 10*3/uL (ref 150–400)
RBC: 3.38 MIL/uL — ABNORMAL LOW (ref 3.87–5.11)
RDW: 15.4 % (ref 11.5–15.5)
WBC: 12.9 10*3/uL — ABNORMAL HIGH (ref 4.0–10.5)
nRBC: 0 % (ref 0.0–0.2)

## 2021-05-31 MED ORDER — ONDANSETRON 4 MG PO TBDP: 4.0000 mg | ORAL_TABLET | Freq: Four times a day (QID) | ORAL | 0 refills | Status: DC | PRN

## 2021-05-31 MED ORDER — ACETAMINOPHEN 500 MG PO TABS: 1000.0000 mg | ORAL_TABLET | Freq: Three times a day (TID) | ORAL | 0 refills | Status: AC

## 2021-05-31 MED ORDER — PANTOPRAZOLE SODIUM 40 MG PO TBEC: 40.0000 mg | DELAYED_RELEASE_TABLET | Freq: Every day | ORAL | 0 refills | Status: AC

## 2021-05-31 MED ORDER — GABAPENTIN 100 MG PO CAPS: 200.0000 mg | ORAL_CAPSULE | Freq: Two times a day (BID) | ORAL | 0 refills | Status: DC

## 2021-05-31 MED ORDER — TRAMADOL HCL 50 MG PO TABS: 50.0000 mg | ORAL_TABLET | Freq: Four times a day (QID) | ORAL | 0 refills | Status: DC | PRN

## 2021-05-31 NOTE — Progress Notes (Signed)
?  Transition of Care (TOC) Screening Note ? ? ?Patient Details  ?Name: Jessica Stevens ?Date of Birth: 1965/10/15 ? ? ?Transition of Care (TOC) CM/SW Contact:    ?Jalayiah Bibian, LCSW ?Phone Number: ?05/31/2021, 11:23 AM ? ? ? ?Transition of Care Department [facility]) has reviewed patient and no TOC needs have been identified at this time. We will continue to monitor patient advancement through interdisciplinary progression rounds. If new patient transition needs arise, please place a TOC consult. ? ? ?

## 2021-05-31 NOTE — Progress Notes (Signed)
Rounded on patient while in PACU.  ? ?Discussed QI "Goals for Discharge" document with patient including ambulation in halls, Incentive Spirometry use every hour, and oral care.  Also discussed pain and nausea control.  BSTOP education provided including BSTOP information guide, "Guide for Pain Management after your Bariatric Procedure".  Diet progression education provided including "Bariatric Surgery Post-Op Food Plan Phase 1: Liquids".  Questions answered.  Will continue to partner with bedside RN and follow up with patient per protocol.  ?

## 2021-05-31 NOTE — Anesthesia Postprocedure Evaluation (Signed)
Anesthesia Post Note ? ?Patient: Jessica Stevens ? ?Procedure(s) Performed: ROBOTIC ROUX-EN-Y GASTRIC BYPASS ?UPPER GI ENDOSCOPY ? ?  ? ?Patient location during evaluation: PACU ?Anesthesia Type: General ?Level of consciousness: awake and alert ?Pain management: pain level controlled ?Vital Signs Assessment: post-procedure vital signs reviewed and stable ?Respiratory status: spontaneous breathing, nonlabored ventilation, respiratory function stable and patient connected to nasal cannula oxygen ?Cardiovascular status: blood pressure returned to baseline and stable ?Postop Assessment: no apparent nausea or vomiting ?Anesthetic complications: no ? ? ?No notable events documented. ? ?Last Vitals:  ?Vitals:  ? 05/31/21 0944 05/31/21 1345  ?BP: (!) 154/135 (!) 160/86  ?Pulse: 85 86  ?Resp: 18 18  ?Temp: 36.7 ?C 36.7 ?C  ?SpO2: 98% 99%  ?  ?Last Pain:  ?Vitals:  ? 05/31/21 1345  ?TempSrc: Oral  ?PainSc:   ? ? ?  ?  ?  ?  ?  ?  ? ?March Rummage [name] ? ? ? ? ?

## 2021-05-31 NOTE — Progress Notes (Signed)
Discharge instructions discussed with patient, verbalized agreement and understanding 

## 2021-05-31 NOTE — Progress Notes (Signed)
Patient alert and oriented, pain is controlled. Patient is tolerating fluids, advanced to protein shake today, patient is tolerating well. Reviewed Gastric Bypass discharge instructions with patient and patient is able to articulate understanding. Provided information on BELT program, Support Group and [facility] outpatient pharmacy. All questions answered, will continue to monitor.    

## 2021-05-31 NOTE — Discharge Summary (Signed)
?Patient ID: ?Jessica Stevens ?[id number] ?56 y.o. ?06-Jun-1965 ? ?05/30/2021 ? ?Discharge date and time: 05/31/2021 ? ?Admitting Physician: Hyman Hopes Anouk Critzer ? ?Discharge Physician: Hyman Hopes Carmelita Amparo ? ?Admission Diagnoses: Morbid obesity (HCC) [E66.01] ?Patient Active Problem List  ? Diagnosis Date Noted  ? Morbid obesity (HCC) 05/30/2021  ? Other bursal cyst, right shoulder 01/16/2018  ? Primary osteoarthritis of left knee 01/16/2018  ? Rotator cuff arthropathy, right 01/16/2018  ? Primary localized osteoarthrosis of right shoulder region 11/22/2016  ? Status post arthroscopy of right shoulder 11/22/2016  ? Guaiac positive stools 12/30/2013  ? GERD 03/25/2010  ? GUAIAC POSITIVE STOOL 03/25/2010  ? NECK PAIN, CHRONIC 03/25/2010  ? HEADACHE 12/30/2009  ? IMPAIRED FASTING GLUCOSE 12/30/2009  ? ROM 11/26/2009  ? TINEA CRURIS 06/29/2009  ? CANDIDIASIS 06/29/2009  ? ECZEMA 03/17/2009  ? CARPAL TUNNEL SYNDROME 12/15/2008  ? INSOMNIA 09/13/2008  ? BACK PAIN, LUMBAR, WITH RADICULOPATHY 09/09/2008  ? HYPERLIPIDEMIA 08/09/2008  ? FOOT PAIN, BILATERAL 08/04/2008  ? KNEE, ARTHRITIS, DEGEN./OSTEO 07/29/2008  ? FATIGUE 05/18/2008  ? ALLERGIC RHINITIS 10/17/2007  ? SHOULDER PAIN, BILATERAL 10/17/2007  ? CERVICAL RADICULOPATHY 10/07/2007  ? ANKLE PAIN, LEFT 08/19/2007  ? OBESITY, UNSPECIFIED 06/25/2007  ? DEPRESSION 06/25/2007  ? ASTHMA, UNSPECIFIED, UNSPECIFIED STATUS 06/25/2007  ? GYNECOMASTIA 06/25/2007  ? URINARY INCONTINENCE 06/25/2007  ? ? ? ?Discharge Diagnoses: Morbid obesity ?Patient Active Problem List  ? Diagnosis Date Noted  ? Morbid obesity (HCC) 05/30/2021  ? Other bursal cyst, right shoulder 01/16/2018  ? Primary osteoarthritis of left knee 01/16/2018  ? Rotator cuff arthropathy, right 01/16/2018  ? Primary localized osteoarthrosis of right shoulder region 11/22/2016  ? Status post arthroscopy of right shoulder 11/22/2016  ? Guaiac positive stools 12/30/2013  ? GERD 03/25/2010  ? GUAIAC POSITIVE STOOL 03/25/2010   ? NECK PAIN, CHRONIC 03/25/2010  ? HEADACHE 12/30/2009  ? IMPAIRED FASTING GLUCOSE 12/30/2009  ? ROM 11/26/2009  ? TINEA CRURIS 06/29/2009  ? CANDIDIASIS 06/29/2009  ? ECZEMA 03/17/2009  ? CARPAL TUNNEL SYNDROME 12/15/2008  ? INSOMNIA 09/13/2008  ? BACK PAIN, LUMBAR, WITH RADICULOPATHY 09/09/2008  ? HYPERLIPIDEMIA 08/09/2008  ? FOOT PAIN, BILATERAL 08/04/2008  ? KNEE, ARTHRITIS, DEGEN./OSTEO 07/29/2008  ? FATIGUE 05/18/2008  ? ALLERGIC RHINITIS 10/17/2007  ? SHOULDER PAIN, BILATERAL 10/17/2007  ? CERVICAL RADICULOPATHY 10/07/2007  ? ANKLE PAIN, LEFT 08/19/2007  ? OBESITY, UNSPECIFIED 06/25/2007  ? DEPRESSION 06/25/2007  ? ASTHMA, UNSPECIFIED, UNSPECIFIED STATUS 06/25/2007  ? GYNECOMASTIA 06/25/2007  ? URINARY INCONTINENCE 06/25/2007  ? ? ?Operations: Procedure(s): ?ROBOTIC ROUX-EN-Y GASTRIC BYPASS ?UPPER GI ENDOSCOPY ? ?Admission Condition: good ? ?Discharged Condition: good ? ?Indication for Admission: Morbid obesity ? ?Hospital Course: Ms. Badura presented for elective robotic gastric bypass.  She recovered well and was discharged. ? ?Consults: None ? ?Significant Diagnostic Studies: None ? ?Treatments: surgery: As above ? ?Disposition: Home ? ?Patient Instructions:  ?Allergies as of 05/31/2021   ? ?   Reactions  ? Penicillins Other (See Comments)  ? According to Allergy Test.  ? Oxycodone-acetaminophen Rash  ? Not allergic to tylenol  ? ?  ? ?  ?Medication List  ?  ? ?STOP taking these medications   ? ?aspirin-acetaminophen-caffeine 250-250-65 MG tablet ?Commonly known as: EXCEDRIN MIGRAINE ?  ?diclofenac Sodium 1 % Gel ?Commonly known as: VOLTAREN ?  ?furosemide 20 MG tablet ?Commonly known as: LASIX ?  ?HYDROcodone-acetaminophen 10-325 MG tablet ?Commonly known as: NORCO ?  ?HYDROcodone-acetaminophen 5-325 MG tablet ?Commonly known as: Norco ?  ?potassium chloride 10 MEQ  CR capsule ?Commonly known as: MICRO-K ?  ? ?  ? ?TAKE these medications   ? ?acetaminophen 500 MG tablet ?Commonly known as: TYLENOL ?Take  2 tablets (1,000 mg total) by mouth every 8 (eight) hours for 5 days. ?  ?albuterol 108 (90 Base) MCG/ACT inhaler ?Commonly known as: VENTOLIN HFA ?Inhale 2 puffs into the lungs 2 (two) times daily as needed for wheezing or shortness of breath. ?  ?amLODipine 10 MG tablet ?Commonly known as: NORVASC ?Take 10 mg by mouth daily. ?Notes to patient: Monitor Blood Pressure Daily and keep a log for primary care physician.  You may need to make changes to your medications with rapid weight loss.   ?  ?Anoro Ellipta 62.5-25 MCG/ACT Aepb ?Generic drug: umeclidinium-vilanterol ?Inhale 1 puff into the lungs daily. ?  ?buPROPion 150 MG 24 hr tablet ?Commonly known as: WELLBUTRIN XL ?Take 150 mg by mouth 2 (two) times daily. ?  ?busPIRone 5 MG tablet ?Commonly known as: BUSPAR ?Take 5 mg by mouth 2 (two) times daily as needed (stress). ?  ?DULoxetine 60 MG capsule ?Commonly known as: CYMBALTA ?Take 60 mg by mouth 2 (two) times daily. ?  ?fluticasone 50 MCG/ACT nasal spray ?Commonly known as: FLONASE ?Place 1 spray into both nostrils daily for 10 days. ?  ?gabapentin 100 MG capsule ?Commonly known as: NEURONTIN ?Take 2 capsules (200 mg total) by mouth every 12 (twelve) hours. ?  ?levocetirizine 5 MG tablet ?Commonly known as: XYZAL ?Take 5 mg by mouth every evening. ?  ?levothyroxine 112 MCG tablet ?Commonly known as: SYNTHROID ?Take 112 mcg by mouth daily before breakfast. ?  ?losartan 100 MG tablet ?Commonly known as: COZAAR ?Take 100 mg by mouth daily. ?Notes to patient: Monitor Blood Pressure Daily and keep a log for primary care physician.  You may need to make changes to your medications with rapid weight loss.   ?  ?montelukast 10 MG tablet ?Commonly known as: SINGULAIR ?Take 10 mg by mouth at bedtime as needed (allergies). ?  ?Multivitamin Adults Tabs ?Take 1 tablet by mouth daily. ?  ?AIRBORNE PO ?Take 1 tablet by mouth in the morning and at bedtime. ?  ?Airborne Chew ?Chew by mouth. ?  ?ondansetron 4 MG disintegrating  tablet ?Commonly known as: ZOFRAN-ODT ?Take 1 tablet (4 mg total) by mouth every 6 (six) hours as needed for nausea or vomiting. ?  ?pantoprazole 40 MG tablet ?Commonly known as: PROTONIX ?Take 1 tablet (40 mg total) by mouth daily. ?  ?phentermine 37.5 MG tablet ?Commonly known as: ADIPEX-P ?Take 37.5 mg by mouth daily before breakfast. ?  ?rosuvastatin 20 MG tablet ?Commonly known as: CRESTOR ?Take 20 mg by mouth daily. ?  ?tiZANidine 2 MG tablet ?Commonly known as: ZANAFLEX ?Take 2 mg by mouth 3 (three) times daily. ?What changed: Another medication with the same name was removed. Continue taking this medication, and follow the directions you see here. ?  ?traMADol 50 MG tablet ?Commonly known as: ULTRAM ?Take 1 tablet (50 mg total) by mouth every 6 (six) hours as needed (pain). ?  ? ?  ? ? ?Activity: no heavy lifting for 4 weeks ?Diet:  Bariatric diet protocol ?Wound Care: keep wound clean and dry ? ?Follow-up:  With Dr. Dossie Der per bariatric follow up protocl. ? ?Signed: ?Hyman Hopes Jaisa Defino ?General, Bariatric, & Minimally Invasive Surgery ?[facility], [place] ? ? ?05/31/2021, 6:02 PM ? ?

## 2021-06-01 NOTE — Progress Notes (Signed)
24hr fluid recall prior to discharge: 642m. ?Per dehydration protocol, will call pt to f/u within one week post op. ?

## 2021-06-03 ENCOUNTER — Telehealth (HOSPITAL_COMMUNITY): Payer: Self-pay | Admitting: *Deleted

## 2021-06-03 ENCOUNTER — Other Ambulatory Visit (HOSPITAL_COMMUNITY): Payer: Self-pay

## 2021-06-03 NOTE — Telephone Encounter (Signed)
1.  Tell me about your pain and pain management? ?Pt c/o abdominal incisional pain with movement and exertion.  Discussed with patient to try and splint her abdomen with changing positions to assist with the discomfort.  Encouraged pt to try options and/or contact [facility] if still concerned.  ? ?2.  Let's talk about fluid intake.  How much total fluid are you taking in? ?Pt states that she is working to meet goal of 64 oz of fluid today.  Pt has been able to consume approx. 55oz of fluid per day since surgery.  Pt plans to increase clear liquids to meet fluid goals. Pt instructed to assess status and suggestions daily utilizing Hydration Action Plan on discharge folder and to call [facility] if in the "red zone".  ? ?3.  How much protein have you taken in the last 2 days? ?Pt has not been able to meet protein goal since discharge.  Pt has not purchased correct protein shakes or powder.  We discussed the patient getting the correct protein today to begin working on meeting daily goal of 60g. Will f/u as needed. ? ?4.  Have you had nausea?  Tell me about when have experienced nausea and what you did to help? ?Pt states that she has had some nausea, but denies vomiting.  Pt instructed to use nausea medication if needed. ?  ?5.  Has the frequency or color changed with your urine? ?Pt states that she is urinating "fine" with no changes in frequency or urgency.   ?  ?6.  Tell me what your incisions look like? ?"Incisions look fine". Pt denies a fever, chills.  Pt states incisions are not swollen, open, or draining.  Pt encouraged to call [facility] if incisions change. ?  ?7.  Have you been passing gas? BM? ?Pt states that she has not had a BM.  Pt instructed to take either Miralax or MoM as instructed per "Gastric Bypass/Sleeve Discharge Home Care Instructions".  Pt to call surgeon's office if not able to have BM with medication. ?  ?8.  If a problem or question were to arise who would you call?  Do you know contact numbers for [place], [facility],  and [facility]? ?Pt denies dehydration symptoms.  Pt can describe s/sx of dehydration.  Pt knows to call [facility] for surgical, [facility] for nutrition, and  for non-urgent questions or concerns. ?  ?9.  How has the walking going? ?Pt states she is walking around and able to be active without difficulty. ?  ?10. Are you still using your incentive spirometer?  If so, how often? ?Pt states that she is using the I.S. hourly. Pt encouraged to use incentive spirometer, at least 10x every hour while awake until she sees the surgeon. ? ?11.  How are your vitamins and calcium going?  How are you taking them? ?Pt has not purchased correct MVI or calcium supplements.  Discussed with pt to refer to pre-op and discharge information for supplement guidelines. Will f/u as needed.  Reinforced education about taking supplements at least two hours apart. ? ?Reminded patient that the first 30 days post-operatively are important for successful recovery.  Practice good hand hygiene, wearing a mask when appropriate (since optional in most places), and minimizing exposure to people who live outside of the home, especially if they are exhibiting any respiratory, GI, or illness-like symptoms.   ? ?

## 2021-06-04 ENCOUNTER — Other Ambulatory Visit (HOSPITAL_COMMUNITY): Payer: Self-pay

## 2021-06-14 ENCOUNTER — Encounter: Payer: Medicare Other | Attending: Surgery | Admitting: Skilled Nursing Facility1

## 2021-06-14 DIAGNOSIS — Z6841 Body Mass Index (BMI) 40.0 and over, adult: Secondary | ICD-10-CM | POA: Insufficient documentation

## 2021-06-14 DIAGNOSIS — I10 Essential (primary) hypertension: Secondary | ICD-10-CM | POA: Insufficient documentation

## 2021-06-14 DIAGNOSIS — Z713 Dietary counseling and surveillance: Secondary | ICD-10-CM | POA: Diagnosis not present

## 2021-06-14 DIAGNOSIS — Z9884 Bariatric surgery status: Secondary | ICD-10-CM | POA: Insufficient documentation

## 2021-06-14 NOTE — Progress Notes (Signed)
2 Week Post-Operative Nutrition Class ?  ?Patient was seen on 06/14/2021 for Post-Operative Nutrition education at the [facility].  ?  ?Surgery date: 05/30/2021 ?Surgery type: RYGB ?Start weight at [facility]: 264 ?Weight today: 256.0 ?  ?Body Composition Scale 06/14/2021  ?Current Body Weight 256.0  ?Total Body Fat % 48.0  ?Visceral Fat 20  ?Fat-Free Mass % 51.9  ? Total Body Water % 40.4  ?Muscle-Mass lbs 29.1  ?BMI 46.1  ?Body Fat Displacement   ?       Torso  lbs 76.2  ?       Left Leg  lbs 15.2  ?       Right Leg  lbs 15.2  ?       Left Arm  lbs 7.6  ?       Right Arm   lbs 7.6  ? ?Clinical  ?Medical hx: depression, HTN ?Medications: see list; multivitamin, calcium ?Labs: Hemoglobin 10.9, HCT 35.6 ?Notable signs/symptoms: back and knee pain ?Any previous deficiencies? Anemia ? ?Pt had some trouble comprehending the education and understanding to eat just the foods in the handout.  Pt was focused on foods off the list, like bread and crackers, bringing them up during the class. ?  ?The following the learning objectives were met by the patient during this course: ?Identifies Phase 3 (Soft, High Proteins) Dietary Goals and will begin from 2 weeks post-operatively to 2 months post-operatively ?Identifies appropriate sources of fluids and proteins  ?Identifies appropriate fat sources and healthy verses unhealthy fat types   ?States protein recommendations and appropriate sources post-operatively ?Identifies the need for appropriate texture modifications, mastication, and bite sizes when consuming solids ?Identifies appropriate fat consumption and sources ?Identifies appropriate multivitamin and calcium sources post-operatively ?Describes the need for physical activity post-operatively and will follow MD recommendations ?States when to call healthcare provider regarding medication questions or post-operative complications ?  ?Handouts given during class include: ?Phase 3A: Soft, High Protein  Diet Handout ?Phase 3 High Protein Meals ?Healthy Fats ?  ?Follow-Up Plan: ?Patient will follow-up at [facility] in 6 weeks for 2 month post-op nutrition visit for diet advancement per MD.  ?

## 2021-06-20 ENCOUNTER — Telehealth: Payer: Self-pay | Admitting: Skilled Nursing Facility1

## 2021-06-20 NOTE — Telephone Encounter (Signed)
RD called pt to verify fluid intake once starting soft, solid proteins 2 week post-bariatric surgery.  ? ?Daily Fluid intake: ?Daily Protein intake: ?Bowel Habits:  ? ?Concerns/issues:  attempted to call patient about her status, but mailbox was full. ?

## 2021-06-21 DIAGNOSIS — D649 Anemia, unspecified: Secondary | ICD-10-CM | POA: Diagnosis not present

## 2021-06-21 DIAGNOSIS — K219 Gastro-esophageal reflux disease without esophagitis: Secondary | ICD-10-CM | POA: Diagnosis not present

## 2021-06-21 DIAGNOSIS — M170 Bilateral primary osteoarthritis of knee: Secondary | ICD-10-CM | POA: Diagnosis not present

## 2021-06-21 DIAGNOSIS — D473 Essential (hemorrhagic) thrombocythemia: Secondary | ICD-10-CM | POA: Diagnosis not present

## 2021-06-21 DIAGNOSIS — M545 Low back pain, unspecified: Secondary | ICD-10-CM | POA: Diagnosis not present

## 2021-06-21 DIAGNOSIS — J4540 Moderate persistent asthma, uncomplicated: Secondary | ICD-10-CM | POA: Diagnosis not present

## 2021-06-21 DIAGNOSIS — J309 Allergic rhinitis, unspecified: Secondary | ICD-10-CM | POA: Diagnosis not present

## 2021-06-21 DIAGNOSIS — E782 Mixed hyperlipidemia: Secondary | ICD-10-CM | POA: Diagnosis not present

## 2021-06-21 DIAGNOSIS — E039 Hypothyroidism, unspecified: Secondary | ICD-10-CM | POA: Diagnosis not present

## 2021-06-27 ENCOUNTER — Telehealth (HOSPITAL_COMMUNITY): Payer: Self-pay | Admitting: *Deleted

## 2021-06-27 ENCOUNTER — Emergency Department (HOSPITAL_COMMUNITY): Payer: Medicare Other

## 2021-06-27 ENCOUNTER — Encounter (HOSPITAL_COMMUNITY): Payer: Self-pay | Admitting: Emergency Medicine

## 2021-06-27 ENCOUNTER — Emergency Department (HOSPITAL_COMMUNITY)
Admission: EM | Admit: 2021-06-27 | Discharge: 2021-06-27 | Disposition: A | Discharge status: Home / Self Care | Payer: Medicare Other | Attending: Emergency Medicine | Admitting: Emergency Medicine

## 2021-06-27 DIAGNOSIS — Z7951 Long term (current) use of inhaled steroids: Secondary | ICD-10-CM | POA: Diagnosis not present

## 2021-06-27 DIAGNOSIS — R110 Nausea: Secondary | ICD-10-CM | POA: Insufficient documentation

## 2021-06-27 DIAGNOSIS — R1110 Vomiting, unspecified: Secondary | ICD-10-CM | POA: Diagnosis not present

## 2021-06-27 DIAGNOSIS — R1013 Epigastric pain: Secondary | ICD-10-CM | POA: Insufficient documentation

## 2021-06-27 DIAGNOSIS — R109 Unspecified abdominal pain: Secondary | ICD-10-CM | POA: Diagnosis not present

## 2021-06-27 DIAGNOSIS — K2100 Gastro-esophageal reflux disease with esophagitis, without bleeding: Secondary | ICD-10-CM | POA: Diagnosis not present

## 2021-06-27 DIAGNOSIS — I700 Atherosclerosis of aorta: Secondary | ICD-10-CM | POA: Diagnosis not present

## 2021-06-27 DIAGNOSIS — J45909 Unspecified asthma, uncomplicated: Secondary | ICD-10-CM | POA: Diagnosis not present

## 2021-06-27 LAB — COMPREHENSIVE METABOLIC PANEL
ALT: 18 U/L (ref 0–44)
AST: 24 U/L (ref 15–41)
Albumin: 4.5 g/dL (ref 3.5–5.0)
Alkaline Phosphatase: 106 U/L (ref 38–126)
Anion gap: 12 (ref 5–15)
BUN: 12 mg/dL (ref 6–20)
CO2: 23 mmol/L (ref 22–32)
Calcium: 10.5 mg/dL — ABNORMAL HIGH (ref 8.9–10.3)
Chloride: 108 mmol/L (ref 98–111)
Creatinine, Ser: 1.06 mg/dL — ABNORMAL HIGH (ref 0.44–1.00)
GFR, Estimated: >60 mL/min (ref >60)
Glucose, Bld: 135 mg/dL — ABNORMAL HIGH (ref 70–99)
Potassium: 3.7 mmol/L (ref 3.5–5.1)
Sodium: 143 mmol/L (ref 135–145)
Total Bilirubin: 0.5 mg/dL (ref 0.3–1.2)
Total Protein: 9 g/dL — ABNORMAL HIGH (ref 6.5–8.1)

## 2021-06-27 LAB — CBC WITH DIFFERENTIAL/PLATELET
Abs Immature Granulocytes: 0.03 10*3/uL (ref 0.00–0.07)
Basophils Absolute: 0 10*3/uL (ref 0.0–0.1)
Basophils Relative: 0 %
Eosinophils Absolute: 0 10*3/uL (ref 0.0–0.5)
Eosinophils Relative: 0 %
HCT: 39.2 % (ref 36.0–46.0)
Hemoglobin: 12.3 g/dL (ref 12.0–15.0)
Immature Granulocytes: 0 %
Lymphocytes Relative: 17 %
Lymphs Abs: 1.5 10*3/uL (ref 0.7–4.0)
MCH: 28.3 pg (ref 26.0–34.0)
MCHC: 31.4 g/dL (ref 30.0–36.0)
MCV: 90.3 fL (ref 80.0–100.0)
Monocytes Absolute: 0.4 10*3/uL (ref 0.1–1.0)
Monocytes Relative: 5 %
Neutro Abs: 6.5 10*3/uL (ref 1.7–7.7)
Neutrophils Relative %: 78 %
Platelets: 350 10*3/uL (ref 150–400)
RBC: 4.34 MIL/uL (ref 3.87–5.11)
RDW: 14.5 % (ref 11.5–15.5)
WBC: 8.5 10*3/uL (ref 4.0–10.5)
nRBC: 0 % (ref 0.0–0.2)

## 2021-06-27 LAB — LIPASE, BLOOD: Lipase: 40 U/L (ref 11–51)

## 2021-06-27 MED ORDER — IOHEXOL 9 MG/ML PO SOLN
ORAL | Status: AC
  Filled 2021-06-27: qty 500

## 2021-06-27 MED ORDER — ONDANSETRON HCL 4 MG/2ML IJ SOLN
4.0000 mg | Freq: Once | INTRAMUSCULAR | Status: AC
  Administered 2021-06-27: 4 mg via INTRAVENOUS
  Filled 2021-06-27: qty 2

## 2021-06-27 MED ORDER — ALUM & MAG HYDROXIDE-SIMETH 200-200-20 MG/5ML PO SUSP
30.0000 mL | Freq: Once | ORAL | Status: AC
  Administered 2021-06-27: 30 mL via ORAL
  Filled 2021-06-27: qty 30

## 2021-06-27 MED ORDER — LIDOCAINE VISCOUS HCL 2 % MT SOLN
15.0000 mL | Freq: Once | OROMUCOSAL | Status: AC
  Administered 2021-06-27: 15 mL via ORAL
  Filled 2021-06-27: qty 15

## 2021-06-27 MED ORDER — IOHEXOL 300 MG/ML  SOLN
100.0000 mL | Freq: Once | INTRAMUSCULAR | Status: AC | PRN
  Administered 2021-06-27: 100 mL via INTRAVENOUS

## 2021-06-27 MED ORDER — HYDROMORPHONE HCL 1 MG/ML IJ SOLN
1.0000 mg | Freq: Once | INTRAMUSCULAR | Status: AC
  Administered 2021-06-27: 1 mg via INTRAVENOUS
  Filled 2021-06-27: qty 1

## 2021-06-27 MED ORDER — LACTATED RINGERS IV BOLUS
1000.0000 mL | Freq: Once | INTRAVENOUS | Status: AC
Start: 2021-06-27 — End: 2021-06-27
  Administered 2021-06-27: 1000 mL via INTRAVENOUS

## 2021-06-27 NOTE — ED Triage Notes (Signed)
Pt c/o upper abd pain with nausea and vomiting. Pt states she had a gastric bypass done at [facility] on 05/30/21. Pt called surgical office and advised to come to ED for eval.  ?

## 2021-06-27 NOTE — ED Provider Notes (Signed)
?[place] ?Provider Note ? ? ?CSN: [id number] ?Arrival date & time: 06/27/21  0407 ? ?  ? ?History ? ?Chief Complaint  ?Patient presents with  ? Abdominal Pain  ? ? ?Jessica Stevens is a 56 y.o. female. ? ?56 yo F who presents to ed for epigastric pain and nausea. No vomiting as she is not able to. Pain radiates to back. Worsening over last 24 hours or so. No fever. Wounds ok. Two bowel movements on Saturday, none Sunday. No distension.  ? ? ?Abdominal Pain ? ?  ? ?Home Medications ?Prior to Admission medications   ?Medication Sig Start Date End Date Taking? Authorizing Provider  ?albuterol (PROVENTIL HFA;VENTOLIN HFA) 108 (90 BASE) MCG/ACT inhaler Inhale 2 puffs into the lungs 2 (two) times daily as needed for wheezing or shortness of breath.    [provider]  ?amLODipine (NORVASC) 10 MG tablet Take 10 mg by mouth daily.    [provider]  ?Celedonio Miyamoto 62.5-25 MCG/INH AEPB Inhale 1 puff into the lungs daily. 08/19/20   [provider]  ?buPROPion (WELLBUTRIN XL) 150 MG 24 hr tablet Take 150 mg by mouth 2 (two) times daily. 11/05/18   [provider]  ?busPIRone (BUSPAR) 5 MG tablet Take 5 mg by mouth 2 (two) times daily as needed (stress). 04/25/20   [provider]  ?DULoxetine (CYMBALTA) 60 MG capsule Take 60 mg by mouth 2 (two) times daily. 11/05/18   [provider]  ?fluticasone (FLONASE) 50 MCG/ACT nasal spray Place 1 spray into both nostrils daily for 10 days. 09/14/19 09/24/19  Evalee Jefferson, PA-C  ?gabapentin (NEURONTIN) 100 MG capsule Take 2 capsules (200 mg total) by mouth every 12 (twelve) hours. 05/31/21   Stechschulte, Nickola Major, MD  ?levocetirizine (XYZAL) 5 MG tablet Take 5 mg by mouth every evening.    [provider]  ?levothyroxine (SYNTHROID) 112 MCG tablet Take 112 mcg by mouth daily before breakfast. 11/05/18   [provider]  ?losartan (COZAAR) 100 MG tablet Take 100 mg by mouth daily.    [provider]  ?montelukast (SINGULAIR) 10 MG tablet Take 10 mg by mouth at bedtime as needed (allergies).    [provider]  ?Multiple Vitamins-Minerals (AIRBORNE PO) Take 1 tablet by mouth in the morning and at bedtime.    [provider]  ?Multiple Vitamins-Minerals (AIRBORNE) CHEW Chew by mouth.    [provider]  ?Multiple Vitamins-Minerals (MULTIVITAMIN ADULTS) TABS Take 1 tablet by mouth daily.    [provider]  ?ondansetron (ZOFRAN-ODT) 4 MG disintegrating tablet Take 1 tablet (4 mg total) by mouth every 6 (six) hours as needed for nausea or vomiting. 05/31/21   Stechschulte, Nickola Major, MD  ?pantoprazole (PROTONIX) 40 MG tablet Take 1 tablet (40 mg total) by mouth daily. 05/31/21   Stechschulte, Nickola Major, MD  ?phentermine (ADIPEX-P) 37.5 MG tablet Take 37.5 mg by mouth daily before breakfast. 09/06/18   [provider]  ?rosuvastatin (CRESTOR) 20 MG tablet Take 20 mg by mouth daily. 04/25/20   [provider]  ?tiZANidine (ZANAFLEX) 2 MG tablet Take 2 mg by mouth 3 (three) times daily. 03/16/21   [provider]  ?traMADol (ULTRAM) 50 MG tablet Take 1 tablet (50 mg total) by mouth every 6 (six) hours as needed (pain). 05/31/21   Stechschulte, Nickola Major, MD  ?   ? ?Allergies    ?Penicillins and Oxycodone-acetaminophen   ? ?Review of Systems   ?Review of  Systems  ?Gastrointestinal:  Positive for abdominal pain.  ? ?Physical Exam ?Updated Vital Signs ?BP (!) 153/107 (BP Location: Right Wrist)   Pulse (!) 107   Resp (!) 22   Ht '5\' 2"'$  (1.575 m)   Wt 112.5 kg   SpO2 100%   BMI 45.36 kg/m?  ?Physical Exam ?Vitals and nursing note reviewed.  ?Constitutional:   ?   Appearance: She is well-developed.  ?HENT:  ?   Head: Normocephalic and atraumatic.  ?Cardiovascular:  ?   Rate and Rhythm: Normal rate and regular rhythm.  ?Pulmonary:  ?   Effort: Pulmonary effort is normal. No respiratory distress.  ?   Breath sounds: No stridor.  ?Abdominal:  ?   General: There  is no distension.  ?   Tenderness: There is abdominal tenderness in the epigastric area. There is no guarding or rebound.  ?Musculoskeletal:  ?   Cervical back: Normal range of motion.  ?Skin: ?   General: Skin is warm and dry.  ?   Comments: Multiple laparoscopic surgical scars in lower abdomen are c/d/i  ?Neurological:  ?   Mental Status: She is alert.  ? ? ?ED Results / Procedures / Treatments   ?Labs ?(all labs ordered are listed, but only abnormal results are displayed) ?Labs Reviewed  ?CBC WITH DIFFERENTIAL/PLATELET  ?COMPREHENSIVE METABOLIC PANEL  ?LIPASE, BLOOD  ? ? ?EKG ?None ? ?Radiology ?No results found. ? ?Procedures ?Procedures  ? ? ?Medications Ordered in ED ?Medications  ?HYDROmorphone (DILAUDID) injection 1 mg (has no administration in time range)  ?lactated ringers bolus 1,000 mL (1,000 mLs Intravenous New Bag/Given 06/27/21 0511)  ?ondansetron [facility]) injection 4 mg (4 mg Intravenous Given 06/27/21 0512)  ? ? ?ED Course/ Medical Decision Making/ A&P ?  ?                        ?Medical Decision Making ?Amount and/or Complexity of Data Reviewed ?Labs: ordered. ?Radiology: ordered. ? ?Risk ?OTC drugs. ?Prescription drug management. ? ? ?Eval for obstruction, pancreatitis or other surgical complications. Symptomatic treatment in meantime.  ? ?Care transferred pending ct/labs and reeval for disposition.  ?  ?Final Clinical Impression(s) / ED Diagnoses ?Final diagnoses:  ?None  ? ? ?Rx / DC Orders ?ED Discharge Orders   ? ? None  ? ?  ? ? ?  ?Merrily Pew, MD ?06/28/21 0216 ? ?

## 2021-06-27 NOTE — ED Provider Notes (Signed)
Patient with no pain or nausea now.  I spoke with her general surgeon and he felt comfortable with letting her go home and follow-up with him in the office ?  ?Milton Ferguson, MD ?06/27/21 1019 ? ?

## 2021-06-27 NOTE — Discharge Instructions (Signed)
Drink plenty of fluids.  Call your surgeon's office today to get seen this week.  Return if any problems ?

## 2021-06-27 NOTE — Telephone Encounter (Addendum)
Received two missed calls from Jessica Stevens yesterday afternoon.  Upon returning her call, a family member shared that she was having "bent over" abdominal pain and nausea, denies vomiting or diarrhea.  Patient stated that she "hasn't been able to drink or eat much and had just had a popsicle". Encouraged patient to take nausea medication as prescribed and to continue to monitor symptoms and drink as tolerated.  Instructed patient to call [facility] is symptoms worsen or to come to [facility] ED if needed further evaluation.  Patient and family felt comfortable with the plan. ?

## 2021-06-27 NOTE — Telephone Encounter (Signed)
Returned patient's missed phone call and patient informed me that she went to [facility] around 0330 for continued abdominal pain.  Pt stated that the did a CT scan and that she "felt much better".  After more conversation, pt shared that she did eat some fried chicken yesterday without the skin prior to abdominal pain.  Reinforced diet education with patient and the importance of adhering to the dietary instructions given at her 2 week post op nutrition class. Reinforced the need to consume 60g of protein and 64 fluid oz daily.  Pt stated that we would be following up with [facility].  Will continue to monitor as needed. ?

## 2021-07-28 ENCOUNTER — Encounter: Payer: Self-pay | Admitting: Skilled Nursing Facility1

## 2021-07-28 ENCOUNTER — Encounter: Payer: Medicare Other | Attending: Surgery | Admitting: Skilled Nursing Facility1

## 2021-07-28 DIAGNOSIS — Z713 Dietary counseling and surveillance: Secondary | ICD-10-CM | POA: Insufficient documentation

## 2021-07-28 DIAGNOSIS — E669 Obesity, unspecified: Secondary | ICD-10-CM | POA: Insufficient documentation

## 2021-07-28 DIAGNOSIS — Z6841 Body Mass Index (BMI) 40.0 and over, adult: Secondary | ICD-10-CM | POA: Insufficient documentation

## 2021-07-28 NOTE — Progress Notes (Signed)
Bariatric Nutrition Follow-Up Visit Medical Nutrition Therapy   NUTRITION ASSESSMENT     Surgery date: 05/30/2021 Surgery type: RYGB Start weight at [facility]: 264 Weight today: 241 pounds   Body Composition Scale 06/14/2021  Current Body Weight 256.0  Total Body Fat % 48.0  Visceral Fat 20  Fat-Free Mass % 51.9   Total Body Water % 40.4  Muscle-Mass lbs 29.1  BMI 46.1  Body Fat Displacement          Torso  lbs 76.2         Left Leg  lbs 15.2         Right Leg  lbs 15.2         Left Arm  lbs 7.6         Right Arm   lbs 7.6   Clinical  Medical hx: depression, HTN Medications: see list; multivitamin, calcium Labs:  Notable signs/symptoms: back and knee pain Any previous deficiencies? Anemia   Lifestyle & Dietary Hx  Pt needs very specific instructions but some liberties may still be taken when she is home  Pt cannot follow the phases as her cognition and stress level attempting it will not allow it.  Pt arrives eating outside of the phases.   Pt arrives with severe knee pain.   Pt state she got sick on her stomach a few times stating it was from eating things he had no business eating. Pt states she does not feel hunger.    Pt state she can eat breast and beef fine but cannot tolerate dark meat.   Dietitian ensured pt understood absolutely no fried foods period even if she peels it off.   Pt states she sees where not eating anything does not help her to lose weight faster so she might as well eat.   Pt states [facility] told her not to drink regular juice so she stopped that.   Pt state she hates the Gatorade but thought she had to drink it every day: Dietitian advised pt on this topic  Pt states she really wants her knee replacement surgery.   Pt states he feels like everything was broken down in the education good enough today she will succeed.   Estimated daily fluid intake: unknown oz Estimated daily protein intake: 90 g Supplements: multi and calcium but burnt  out on these Current average weekly physical activity: ADL's due to knee pain   24-Hr Dietary Recall First Meal: ensure max Snack:   Second Meal: gravy potatoes and chicken  Snack:   Third Meal: ensure max Snack: sugar free popcicle Beverages: gatorade zero, water  Post-Op Goals/ Signs/ Symptoms Using straws: no Drinking while eating: no Chewing/swallowing difficulties: no Changes in vision: no Changes to mood/headaches: no Hair loss/changes to skin/nails: no Difficulty focusing/concentrating: no Sweating: no Limb weakness: no Dizziness/lightheadedness: no Palpitations: no  Carbonated/caffeinated beverages: no N/V/D/C/Gas: no Abdominal pain: no Dumping syndrome: no    NUTRITION DIAGNOSIS  Overweight/obesity ([place]-3.3) related to past poor dietary habits and physical inactivity as evidenced by completed bariatric surgery and following dietary guidelines for continued weight loss and healthy nutrition status.     NUTRITION INTERVENTION Nutrition counseling (C-1) and education (E-2) to facilitate bariatric surgery goals, including: The importance of consuming adequate calories as well as certain nutrients daily due to the body's need for essential vitamins, minerals, and fats The importance of daily physical activity and to reach a goal of at least 150 minutes of moderate to vigorous physical activity weekly (or  as directed by their physician) due to benefits such as increased musculature and improved lab values The importance of intuitive eating specifically learning hunger-satiety cues and understanding the importance of learning a new body: The importance of mindful eating to avoid grazing behaviors  Encouraged patient to honor their body's internal hunger and fullness cues.  Throughout the day, check in mentally and rate hunger. Stop eating when satisfied not full regardless of how much food is left on the plate.  Get more if still hungry 20-30 minutes later.  The key is to  honor satisfaction so throughout the meal, rate fullness factor and stop when comfortably satisfied not physically full. The key is to honor hunger and fullness without any feelings of guilt or shame.  Pay attention to what the internal cues are, rather than any external factors. This will enhance the confidence you have in listening to your own body and following those internal cues enabling you to increase how often you eat when you are hungry not out of appetite and stop when you are satisfied not full.  Encouraged pt to continue to eat balanced meals inclusive of non starchy vegetables 2 times a day 7 days a week Encouraged pt to choose lean protein sources: limiting beef, pork, sausage, hotdogs, and lunch meat Encourage pt to choose healthy fats such as plant based limiting animal fats Encouraged pt to continue to drink a minium 64 fluid ounces with half being plain water to satisfy proper hydration Educated pt with the use of physical measuring cups in office   Goals: -get rid of the Gatorade only drink water -follow the very specific instructions written out for you -it is too soon for bread and pasta, do not eat these  Handouts Provided Include  Detailed bariatric MyPlate with specific hand written notes for pt  Learning Style & Readiness for Change Teaching method utilized: Visual & Auditory  Demonstrated degree of understanding via: Teach Back  Readiness Level: contemplative Barriers to learning/adherence to lifestyle change: cognitive ability   RD's Notes for Next Visit Assess adherence to pt chosen goals    MONITORING & EVALUATION Dietary intake, weekly physical activity, body weight  Next Steps Patient is to follow-up in 6 weeks

## 2021-08-04 DIAGNOSIS — R5383 Other fatigue: Secondary | ICD-10-CM | POA: Diagnosis not present

## 2021-08-04 DIAGNOSIS — I10 Essential (primary) hypertension: Secondary | ICD-10-CM | POA: Diagnosis not present

## 2021-08-04 DIAGNOSIS — R110 Nausea: Secondary | ICD-10-CM | POA: Diagnosis not present

## 2021-08-10 DIAGNOSIS — M25562 Pain in left knee: Secondary | ICD-10-CM | POA: Diagnosis not present

## 2021-08-10 DIAGNOSIS — M25512 Pain in left shoulder: Secondary | ICD-10-CM | POA: Diagnosis not present

## 2021-08-26 DIAGNOSIS — Z9884 Bariatric surgery status: Secondary | ICD-10-CM | POA: Diagnosis not present

## 2021-08-26 DIAGNOSIS — R5383 Other fatigue: Secondary | ICD-10-CM | POA: Diagnosis not present

## 2021-08-26 DIAGNOSIS — R110 Nausea: Secondary | ICD-10-CM | POA: Diagnosis not present

## 2021-08-26 DIAGNOSIS — I10 Essential (primary) hypertension: Secondary | ICD-10-CM | POA: Diagnosis not present

## 2021-08-26 DIAGNOSIS — K912 Postsurgical malabsorption, not elsewhere classified: Secondary | ICD-10-CM | POA: Diagnosis not present

## 2021-09-08 DIAGNOSIS — R5383 Other fatigue: Secondary | ICD-10-CM | POA: Diagnosis not present

## 2021-09-08 DIAGNOSIS — I10 Essential (primary) hypertension: Secondary | ICD-10-CM | POA: Diagnosis not present

## 2021-09-12 DIAGNOSIS — E538 Deficiency of other specified B group vitamins: Secondary | ICD-10-CM | POA: Diagnosis not present

## 2021-09-14 ENCOUNTER — Ambulatory Visit: Payer: Medicare Other | Admitting: Skilled Nursing Facility1

## 2021-09-20 ENCOUNTER — Ambulatory Visit: Payer: Medicare Other | Admitting: Dietician

## 2021-10-01 DIAGNOSIS — R42 Dizziness and giddiness: Secondary | ICD-10-CM | POA: Diagnosis not present

## 2021-10-01 DIAGNOSIS — L259 Unspecified contact dermatitis, unspecified cause: Secondary | ICD-10-CM | POA: Diagnosis not present

## 2021-10-11 ENCOUNTER — Encounter: Payer: Self-pay | Admitting: Dietician

## 2021-10-11 ENCOUNTER — Encounter: Payer: Medicare Other | Attending: Surgery | Admitting: Dietician

## 2021-10-11 DIAGNOSIS — Z9884 Bariatric surgery status: Secondary | ICD-10-CM | POA: Insufficient documentation

## 2021-10-11 DIAGNOSIS — Z713 Dietary counseling and surveillance: Secondary | ICD-10-CM | POA: Insufficient documentation

## 2021-10-11 DIAGNOSIS — I10 Essential (primary) hypertension: Secondary | ICD-10-CM | POA: Diagnosis not present

## 2021-10-11 DIAGNOSIS — E669 Obesity, unspecified: Secondary | ICD-10-CM | POA: Insufficient documentation

## 2021-10-11 DIAGNOSIS — Z6841 Body Mass Index (BMI) 40.0 and over, adult: Secondary | ICD-10-CM | POA: Insufficient documentation

## 2021-10-11 NOTE — Progress Notes (Signed)
Bariatric Nutrition Follow-Up Visit Medical Nutrition Therapy   NUTRITION ASSESSMENT    Surgery date: 05/30/2021 Surgery type: RYGB  Start weight at [facility]: 264 Height: 61 in Weight today: 217.0 pounds BMI: 41.00 kg/m   Body Composition Scale 06/14/2021 10/11/2021  Current Body Weight 256.0 217.0  Total Body Fat % 48.0 45.3  Visceral Fat 20 17  Fat-Free Mass % 51.9 54.6   Total Body Water % 40.4 41.8  Muscle-Mass lbs 29.1 27.5  BMI 46.1 41.1  Body Fat Displacement           Torso  lbs 76.2 60.8         Left Leg  lbs 15.2 12.1         Right Leg  lbs 15.2 12.1         Left Arm  lbs 7.6 6.0         Right Arm   lbs 7.6 6.0   Clinical  Medical hx: depression, HTN Medications: see list; multivitamin, calcium Labs:  Notable signs/symptoms: back and knee pain Any previous deficiencies? Anemia   Lifestyle & Dietary Hx  Pt arrived with knee brace, stating she can have knee replacement surgery if she get under 200 lbs. Pt states she stays with her aunt now, states she tries to help her out. Pt states she gets a smoothie from [employer]. Pt states she loves broccoli and string beans, but does not fix them like she should, stating she gets frozen vegetables and gets out of the freezer what she needs/wants. Discussed with pt the meal ideas sheet, and the importance of complex carbohydrates.  Discussed the difference between complex and simple carbohydrates, using the smoothie from [employer] as an example.   Estimated daily fluid intake: 56 oz Estimated daily protein intake: 60 g Supplements: multi and calcium but burnt out on these Current average weekly physical activity: ADL's due to knee pain   24-Hr Dietary Recall First Meal: boiled egg Snack:   Second Meal: skip or [place] nuts Snack:   Third Meal: cheese burger or baked fish Snack:  Beverages: water, un-sweet tea  Post-Op Goals/ Signs/ Symptoms Using straws: no Drinking while eating: no Chewing/swallowing  difficulties: no Changes in vision: no Changes to mood/headaches: no Hair loss/changes to skin/nails: no Difficulty focusing/concentrating: no Sweating: no Limb weakness: no Dizziness/lightheadedness: no Palpitations: no  Carbonated/caffeinated beverages: no N/V/D/C/Gas: no Abdominal pain: no Dumping syndrome: no    NUTRITION DIAGNOSIS  Overweight/obesity ([place]-3.3) related to past poor dietary habits and physical inactivity as evidenced by completed bariatric surgery and following dietary guidelines for continued weight loss and healthy nutrition status.     NUTRITION INTERVENTION Nutrition counseling (C-1) and education (E-2) to facilitate bariatric surgery goals, including: The importance of consuming adequate calories as well as certain nutrients daily due to the body's need for essential vitamins, minerals, and fats The importance of daily physical activity and to reach a goal of at least 150 minutes of moderate to vigorous physical activity weekly (or as directed by their physician) due to benefits such as increased musculature and improved lab values Encouraged pt to continue to eat balanced meals inclusive of non starchy vegetables 2 times a day 7 days a week Encouraged pt to choose lean protein sources: limiting beef, pork, sausage, hotdogs, and lunch meat Encourage pt to choose healthy fats such as plant based limiting animal fats Encouraged pt to continue to drink a minium 64 fluid ounces with half being plain water to satisfy proper hydration  Goals: -get rid of the Gatorade only drink water -follow the very specific instructions written out for you -it is too soon for bread and pasta, do not eat these New: avoid smoothie at [place]: use meal ideas handout to make meals at home Re-engage: avoid skipping meals and eat every 3-5 hours.  Handouts Provided Include  Meal Ideas handout  Learning Style & Readiness for Change Teaching method utilized: Visual &  Auditory  Demonstrated degree of understanding via: Teach Back  Readiness Level: contemplative Barriers to learning/adherence to lifestyle change: cognitive ability   RD's Notes for Next Visit Assess adherence to pt chosen goals    MONITORING & EVALUATION Dietary intake, weekly physical activity, body weight  Next Steps Patient is to follow-up in 6 weeks

## 2021-10-17 DIAGNOSIS — E538 Deficiency of other specified B group vitamins: Secondary | ICD-10-CM | POA: Diagnosis not present

## 2021-10-27 DIAGNOSIS — E039 Hypothyroidism, unspecified: Secondary | ICD-10-CM | POA: Diagnosis not present

## 2021-10-27 DIAGNOSIS — E782 Mixed hyperlipidemia: Secondary | ICD-10-CM | POA: Diagnosis not present

## 2021-10-27 DIAGNOSIS — N183 Chronic kidney disease, stage 3 unspecified: Secondary | ICD-10-CM | POA: Diagnosis not present

## 2021-10-27 DIAGNOSIS — I129 Hypertensive chronic kidney disease with stage 1 through stage 4 chronic kidney disease, or unspecified chronic kidney disease: Secondary | ICD-10-CM | POA: Diagnosis not present

## 2021-11-03 DIAGNOSIS — M50122 Cervical disc disorder at C5-C6 level with radiculopathy: Secondary | ICD-10-CM | POA: Diagnosis not present

## 2021-11-14 DIAGNOSIS — E538 Deficiency of other specified B group vitamins: Secondary | ICD-10-CM | POA: Diagnosis not present

## 2021-11-16 DIAGNOSIS — M25562 Pain in left knee: Secondary | ICD-10-CM | POA: Diagnosis not present

## 2021-11-16 DIAGNOSIS — M25512 Pain in left shoulder: Secondary | ICD-10-CM | POA: Diagnosis not present

## 2021-12-15 ENCOUNTER — Ambulatory Visit: Payer: Medicare Other | Admitting: Dietician

## 2021-12-15 DIAGNOSIS — E039 Hypothyroidism, unspecified: Secondary | ICD-10-CM | POA: Diagnosis not present

## 2021-12-15 DIAGNOSIS — E538 Deficiency of other specified B group vitamins: Secondary | ICD-10-CM | POA: Diagnosis not present

## 2021-12-15 DIAGNOSIS — I10 Essential (primary) hypertension: Secondary | ICD-10-CM | POA: Diagnosis not present

## 2021-12-15 DIAGNOSIS — E785 Hyperlipidemia, unspecified: Secondary | ICD-10-CM | POA: Diagnosis not present

## 2022-01-02 ENCOUNTER — Ambulatory Visit: Payer: Medicare Other | Admitting: Dietician

## 2022-01-10 ENCOUNTER — Other Ambulatory Visit (HOSPITAL_COMMUNITY): Payer: Self-pay | Admitting: Nurse Practitioner

## 2022-01-10 DIAGNOSIS — J309 Allergic rhinitis, unspecified: Secondary | ICD-10-CM | POA: Diagnosis not present

## 2022-01-10 DIAGNOSIS — M170 Bilateral primary osteoarthritis of knee: Secondary | ICD-10-CM | POA: Diagnosis not present

## 2022-01-10 DIAGNOSIS — J4540 Moderate persistent asthma, uncomplicated: Secondary | ICD-10-CM | POA: Diagnosis not present

## 2022-01-10 DIAGNOSIS — Z01818 Encounter for other preprocedural examination: Secondary | ICD-10-CM | POA: Diagnosis not present

## 2022-01-10 DIAGNOSIS — Z23 Encounter for immunization: Secondary | ICD-10-CM | POA: Diagnosis not present

## 2022-01-10 DIAGNOSIS — K219 Gastro-esophageal reflux disease without esophagitis: Secondary | ICD-10-CM | POA: Diagnosis not present

## 2022-01-10 DIAGNOSIS — Z1231 Encounter for screening mammogram for malignant neoplasm of breast: Secondary | ICD-10-CM

## 2022-01-10 DIAGNOSIS — D473 Essential (hemorrhagic) thrombocythemia: Secondary | ICD-10-CM | POA: Diagnosis not present

## 2022-01-10 DIAGNOSIS — N390 Urinary tract infection, site not specified: Secondary | ICD-10-CM | POA: Diagnosis not present

## 2022-01-10 DIAGNOSIS — M545 Low back pain, unspecified: Secondary | ICD-10-CM | POA: Diagnosis not present

## 2022-01-10 DIAGNOSIS — Z0001 Encounter for general adult medical examination with abnormal findings: Secondary | ICD-10-CM | POA: Diagnosis not present

## 2022-01-10 DIAGNOSIS — D649 Anemia, unspecified: Secondary | ICD-10-CM | POA: Diagnosis not present

## 2022-01-17 ENCOUNTER — Encounter: Payer: Self-pay | Admitting: Skilled Nursing Facility1

## 2022-01-17 ENCOUNTER — Encounter: Payer: Medicare Other | Attending: Internal Medicine | Admitting: Skilled Nursing Facility1

## 2022-01-17 VITALS — Wt 186.7 lb

## 2022-01-17 DIAGNOSIS — Z6841 Body Mass Index (BMI) 40.0 and over, adult: Secondary | ICD-10-CM | POA: Insufficient documentation

## 2022-01-17 DIAGNOSIS — E669 Obesity, unspecified: Secondary | ICD-10-CM | POA: Insufficient documentation

## 2022-01-17 DIAGNOSIS — I10 Essential (primary) hypertension: Secondary | ICD-10-CM | POA: Insufficient documentation

## 2022-01-17 DIAGNOSIS — Z713 Dietary counseling and surveillance: Secondary | ICD-10-CM | POA: Diagnosis not present

## 2022-01-17 DIAGNOSIS — Z9884 Bariatric surgery status: Secondary | ICD-10-CM | POA: Diagnosis not present

## 2022-01-17 NOTE — Progress Notes (Signed)
Bariatric Nutrition Follow-Up Visit Medical Nutrition Therapy   NUTRITION ASSESSMENT    Surgery date: 05/30/2021 Surgery type: RYGB  Start weight at [facility]: 264 Weight today: 186 pounds  Body Composition Scale 06/14/2021 10/11/2021  Current Body Weight 256.0 217.0  Total Body Fat % 48.0 45.3  Visceral Fat 20 17  Fat-Free Mass % 51.9 54.6   Total Body Water % 40.4 41.8  Muscle-Mass lbs 29.1 27.5  BMI 46.1 41.1  Body Fat Displacement           Torso  lbs 76.2 60.8         Left Leg  lbs 15.2 12.1         Right Leg  lbs 15.2 12.1         Left Arm  lbs 7.6 6.0         Right Arm   lbs 7.6 6.0   Clinical  Medical hx: depression, HTN Medications: see list; multivitamin, calcium Labs:  Notable signs/symptoms: back and knee pain Any previous deficiencies? Anemia   Lifestyle & Dietary Hx   Pt states is going to get a knee surgery, just waiting on a phone call. Arrives in hip pain.  Pt state she did get some hearing aides which has been helpful.  Pt states she is not done losing weight yet.    Estimated daily fluid intake: 56-60 oz Estimated daily protein intake: 7 g Supplements: multi and calcium Current average weekly physical activity: ADL's due to knee pain   24-Hr Dietary Recall First Meal 2:77-4: 1 slice wheat bread + 1/2 egg Snack:   Second Meal: sugar free jello Snack:   Third Meal: tossed salad: tomato, cheese, cucumber, pickle  Snack:  Beverages: water, un-sweet tea  Post-Op Goals/ Signs/ Symptoms Using straws: no Drinking while eating: no Chewing/swallowing difficulties: no Changes in vision: no Changes to mood/headaches: no Hair loss/changes to skin/nails: no Difficulty focusing/concentrating: no Sweating: no Limb weakness: no Dizziness/lightheadedness: no Palpitations: no  Carbonated/caffeinated beverages: no N/V/D/C/Gas: no Abdominal pain: no Dumping syndrome: no    NUTRITION DIAGNOSIS  Overweight/obesity ([place]-3.3) related to past poor dietary  habits and physical inactivity as evidenced by completed bariatric surgery and following dietary guidelines for continued weight loss and healthy nutrition status.     NUTRITION INTERVENTION Nutrition counseling (C-1) and education (E-2) to facilitate bariatric surgery goals, including: The importance of consuming adequate calories as well as certain nutrients daily due to the body's need for essential vitamins, minerals, and fats The importance of daily physical activity and to reach a goal of at least 150 minutes of moderate to vigorous physical activity weekly (or as directed by their physician) due to benefits such as increased musculature and improved lab values Encouraged pt to continue to eat balanced meals inclusive of non starchy vegetables 2 times a day 7 days a week Encouraged pt to choose lean protein sources: limiting beef, pork, sausage, hotdogs, and lunch meat Encourage pt to choose healthy fats such as plant based limiting animal fats Encouraged pt to continue to drink a minium 64 fluid ounces with half being plain water to satisfy proper hydration Why you need complex carbohydrates: Whole grains and other complex carbohydrates are required to have a healthy diet. Whole grains provide fiber which can help with blood glucose levels and help keep you satiated. Fruits and starchy vegetables provide essential vitamins and minerals required for immune function, eyesight support, brain support, bone density, wound healing and many other functions within the body. According to  the current evidenced based 2020-2025 Dietary Guidelines for Americans, complex carbohydrates are part of a healthy eating pattern which is associated with a decreased risk for type 2 diabetes, cancers, and cardiovascular disease.  Purpose of protein: Every cell in your body has protein. Protein is essential for the structure, function and regulation of tissues and organs within the body. Without protein enzymes and  antibodies would not exist, and cells would lack storage, transportation, and messenger systems. According to [facility]. [place], the body is made up of at least 10000 different proteins. Lack of protein can lead to growth failure in children, loss of muscle mass, decreased immune system function, and overall weakening of various organs in the body.  [email or web address], [email or web address].be, [employer] Importance of vegetables To have an overall healthy diet, adult men and women are recommended to consume anywhere from 2-3 cups of vegetables daily. Vegetables provide a wide range of vitamins and minerals such as vitamin A, vitamin C, potassium, and folic acid. According to the [facility], including fruit and vegetables daily may reduce the risk of cardiovascular disease, certain cancers, and other non-communicable diseases.   Goals: -you are severely under consuming please follow the meal idea sheet to ensure you are eating appropriately   Handouts Provided Include: Gave again  Meal Ideas handout  Learning Style & Readiness for Change Teaching method utilized: Visual & Auditory  Demonstrated degree of understanding via: Teach Back  Readiness Level: contemplative Barriers to learning/adherence to lifestyle change: cognitive ability   RD's Notes for Next Visit Assess adherence to pt chosen goals    MONITORING & EVALUATION Dietary intake, weekly physical activity, body weight  Next Steps Patient is to follow-up in 6 weeks

## 2022-02-06 DIAGNOSIS — M5412 Radiculopathy, cervical region: Secondary | ICD-10-CM | POA: Diagnosis not present

## 2022-02-14 DIAGNOSIS — E538 Deficiency of other specified B group vitamins: Secondary | ICD-10-CM | POA: Diagnosis not present

## 2022-02-14 DIAGNOSIS — G894 Chronic pain syndrome: Secondary | ICD-10-CM | POA: Diagnosis not present

## 2022-02-16 DIAGNOSIS — M25512 Pain in left shoulder: Secondary | ICD-10-CM | POA: Diagnosis not present

## 2022-02-22 DIAGNOSIS — Z79899 Other long term (current) drug therapy: Secondary | ICD-10-CM | POA: Diagnosis not present

## 2022-02-22 DIAGNOSIS — Z9884 Bariatric surgery status: Secondary | ICD-10-CM | POA: Diagnosis not present

## 2022-02-22 DIAGNOSIS — M7712 Lateral epicondylitis, left elbow: Secondary | ICD-10-CM | POA: Diagnosis not present

## 2022-02-22 DIAGNOSIS — M7711 Lateral epicondylitis, right elbow: Secondary | ICD-10-CM | POA: Diagnosis not present

## 2022-02-28 ENCOUNTER — Ambulatory Visit: Payer: Medicare Other | Admitting: Skilled Nursing Facility1

## 2022-02-28 NOTE — H&P (Signed)
Patient's anticipated LOS is less than 2 midnights, meeting these requirements: - Younger than 42 - Lives within 1 hour of care - Has a competent adult at home to recover with post-op recover - NO history of  - Chronic pain requiring opiods  - Diabetes  - Coronary Artery Disease  - Heart failure  - Heart attack  - Stroke  - DVT/VTE  - Cardiac arrhythmia  - Respiratory Failure/COPD  - Renal failure  - Anemia  - Advanced Liver disease     Jessica Stevens is an 57 y.o. female.    Chief Complaint: left knee pain  HPI: Pt is a 57 y.o. female complaining of left knee pain for multiple years. Pain had continually increased since the beginning. X-rays in the clinic show end-stage arthritic changes of the left knee. Pt has tried various conservative treatments which have failed to alleviate their symptoms, including injections and therapy. Various options are discussed with the patient. Risks, benefits and expectations were discussed with the patient. Patient understand the risks, benefits and expectations and wishes to proceed with surgery.   PCP:  Celene Squibb, MD  D/C Plans: Home  PMH: Past Medical History:  Diagnosis Date   Allergy    Anemia    Ankle pain, left    Anxiety    Arthritis of knee, degenerative    Asthma    Carpal tunnel syndrome    Cervical radiculopathy    Chronic back pain    Complete rupture of rotator cuff    Complete rupture of rotator cuff    Depression    GERD (gastroesophageal reflux disease)    Gynecomastia    HNP (herniated nucleus pulposus), cervical    HNP (herniated nucleus pulposus), cervical    Hyperlipidemia    Hypertension    Hypothyroidism    Knee pain    OA (osteoarthritis) of knee    Obesity    Sleep apnea    hx of    Urinary incontinence     PSH: Past Surgical History:  Procedure Laterality Date   arthroscopy  left knee  08/14/2008   Dr. Aline Brochure    BIOPSY  09/20/2020   Procedure: BIOPSY;  Surgeon: Doran Stabler,  MD;  Location: [facility];  Service: Gastroenterology;;   COLONOSCOPY N/A 03/11/2014   Procedure: COLONOSCOPY;  Surgeon: Rogene Houston, MD;  Location: [facility];  Service: Endoscopy;  Laterality: N/A;  730 - moved to 1/13 @ 12:00   COLONOSCOPY     ESOPHAGOGASTRODUODENOSCOPY (EGD) WITH PROPOFOL N/A 09/20/2020   Procedure: ESOPHAGOGASTRODUODENOSCOPY (EGD) WITH PROPOFOL;  Surgeon: Doran Stabler, MD;  Location: [facility];  Service: Gastroenterology;  Laterality: N/A;   PARTIAL HYSTERECTOMY     POLYPECTOMY     REVERSE SHOULDER ARTHROPLASTY Right 05/27/2020   Procedure: REVERSE SHOULDER ARTHROPLASTY;  Surgeon: Tania Ade, MD;  Location: [facility];  Service: Orthopedics;  Laterality: Right;   tube insert in right ear  02/27/2001   UPPER GI ENDOSCOPY N/A 05/30/2021   Procedure: UPPER GI ENDOSCOPY;  Surgeon: Felicie Morn, MD;  Location: [facility];  Service: General;  Laterality: N/A;    Social History:  reports that she has never smoked. She has never used smokeless tobacco. She reports current alcohol use. She reports current drug use. Drug: Marijuana. BMI: Estimated body mass index is 35.28 kg/m as calculated from the following:   Height as of 10/11/21: '5\' 1"'$  (1.549 m).   Weight as of 01/17/22: 84.7 kg.  Lab Results  Component Value Date   ALBUMIN 4.5 06/27/2021   Diabetes: Patient does not have a diagnosis of diabetes.     Smoking Status:   reports that she has never smoked. She has never used smokeless tobacco.    Allergies:  Allergies  Allergen Reactions   Penicillins Other (See Comments)    According to Allergy Test.    Oxycodone-Acetaminophen Rash    Not allergic to tylenol    Medications: No current facility-administered medications for this encounter.   Current Outpatient Medications  Medication Sig Dispense Refill   albuterol (PROVENTIL HFA;VENTOLIN HFA) 108 (90 BASE) MCG/ACT inhaler Inhale 2 puffs into the lungs 2 (two) times daily as needed for  wheezing or shortness of breath.     amLODipine (NORVASC) 10 MG tablet Take 10 mg by mouth daily.     ANORO ELLIPTA 62.5-25 MCG/INH AEPB Inhale 1 puff into the lungs daily.     buPROPion (WELLBUTRIN XL) 150 MG 24 hr tablet Take 150 mg by mouth 2 (two) times daily.     busPIRone (BUSPAR) 5 MG tablet Take 5 mg by mouth 2 (two) times daily as needed (stress).     DULoxetine (CYMBALTA) 60 MG capsule Take 60 mg by mouth 2 (two) times daily.     famotidine (PEPCID) 20 MG tablet Take 20 mg by mouth 2 (two) times daily.     fluticasone (FLONASE) 50 MCG/ACT nasal spray Place 1 spray into both nostrils daily for 10 days. 9.9 mL 0   furosemide (LASIX) 20 MG tablet Take 20 mg by mouth every morning.     gabapentin (NEURONTIN) 100 MG capsule Take 2 capsules (200 mg total) by mouth every 12 (twelve) hours. (Patient not taking: Reported on 06/27/2021) 20 capsule 0   HYDROcodone-acetaminophen (NORCO) 10-325 MG tablet Take 1 tablet by mouth 3 (three) times daily.     levocetirizine (XYZAL) 5 MG tablet Take 5 mg by mouth every evening.     levothyroxine (SYNTHROID) 112 MCG tablet Take 112 mcg by mouth daily before breakfast.     losartan (COZAAR) 100 MG tablet Take 100 mg by mouth daily.     montelukast (SINGULAIR) 10 MG tablet Take 10 mg by mouth at bedtime as needed (allergies).     Multiple Vitamins-Minerals (AIRBORNE PO) Take 1 tablet by mouth in the morning and at bedtime.     Multiple Vitamins-Minerals (AIRBORNE) CHEW Chew by mouth.     ondansetron (ZOFRAN-ODT) 4 MG disintegrating tablet Take 1 tablet (4 mg total) by mouth every 6 (six) hours as needed for nausea or vomiting. 20 tablet 0   pantoprazole (PROTONIX) 40 MG tablet Take 1 tablet (40 mg total) by mouth daily. 90 tablet 0   potassium chloride (MICRO-K) 10 MEQ CR capsule Take 10 mEq by mouth daily.     rosuvastatin (CRESTOR) 20 MG tablet Take 20 mg by mouth daily.     tiZANidine (ZANAFLEX) 2 MG tablet Take 2 mg by mouth 3 (three) times daily.      traMADol (ULTRAM) 50 MG tablet Take 1 tablet (50 mg total) by mouth every 6 (six) hours as needed (pain). (Patient not taking: Reported on 06/27/2021) 10 tablet 0    No results found for this or any previous visit (from the past 48 hour(s)). No results found.  ROS: Pain with rom of the left lower extremity  Physical Exam: Alert and oriented 57 y.o. female in no acute distress Cranial nerves 2-12 intact Cervical spine: full rom with  no tenderness, nv intact distally Chest: active breath sounds bilaterally, no wheeze rhonchi or rales Heart: regular rate and rhythm, no murmur Abd: non tender non distended with active bowel sounds Hip is stable with rom  Left knee painful rom with crepitus Nv intact distally No rashes or edema Antalgic gait  Assessment/Plan Assessment: left knee end stage osteoarthritis  Plan:  Patient will undergo a left total knee by Dr. Veverly Fells at [place] Risks benefits and expectations were discussed with the patient. Patient understand risks, benefits and expectations and wishes to proceed. Preoperative templating of the joint replacement has been completed, documented, and submitted to the Operating Room personnel in order to optimize intra-operative equipment management.   Merla Riches PA-C, MPAS [facility] is now [employer] [address], [place], [place], [place] [address] Phone: [phone] [email or web address] Facebook  [employer]

## 2022-03-09 NOTE — Patient Instructions (Addendum)
SURGICAL WAITING ROOM VISITATION  Patients having surgery or a procedure may have no more than 2 support people in the waiting area - these visitors may rotate.    Children under the age of 48 must have an adult with them who is not the patient.  Due to an increase in RSV and influenza rates and associated hospitalizations, children ages 62 and under may not visit patients in [place].  If the patient needs to stay at the hospital during part of their recovery, the visitor guidelines for inpatient rooms apply. Pre-op nurse will coordinate an appropriate time for 1 support person to accompany patient in pre-op.  This support person may not rotate.    Please refer to the [facility] website for the visitor guidelines for Inpatients (after your surgery is over and you are in a regular room).    Your procedure is scheduled on: 03/24/22   Report to [facility] Main Entrance    Report to admitting at 9:55 AM   Call this number if you have problems the morning of surgery [phone]   Do not eat food :After Midnight.   After Midnight you may have the following liquids until 9:25 AM DAY OF SURGERY  Water Non-Citrus Juices (without pulp, NO RED-Apple, White grape, White cranberry) Black Coffee (NO MILK/CREAM OR CREAMERS, sugar ok)  Clear Tea (NO MILK/CREAM OR CREAMERS, sugar ok) regular and decaf                             Plain Jell-O (NO RED)                                           Fruit ices (not with fruit pulp, NO RED)                                     Popsicles (NO RED)                                                               Sports drinks like Gatorade (NO RED)               The day of surgery:  Drink ONE (1) Pre-Surgery Clear Ensure at 9:25 AM the morning of surgery. Drink in one sitting. Do not sip.  This drink was given to you during your hospital  pre-op appointment visit. Nothing else to drink after completing the  Pre-Surgery Clear Ensure.           If you have questions, please contact your surgeon's office.   FOLLOW BOWEL PREP AND ANY ADDITIONAL PRE OP INSTRUCTIONS YOU RECEIVED FROM YOUR SURGEON'S OFFICE!!!     Oral Hygiene is also important to reduce your risk of infection.                                    Remember - BRUSH YOUR TEETH THE MORNING OF SURGERY WITH YOUR REGULAR TOOTHPASTE  DENTURES WILL BE REMOVED  PRIOR TO SURGERY PLEASE DO NOT APPLY "Poly grip" OR ADHESIVES!!!   Do NOT smoke after Midnight   Take these medicines the morning of surgery with A SIP OF WATER: Inhalers, Amlodipine, Bupropion, Buspirone, Levothyroxine, Zofran, Pantoprazole, Rosuvastatin, Hydralazine   Bring CPAP mask and tubing day of surgery.                              You may not have any metal on your body including hair pins, jewelry, and body piercing             Do not wear make-up, lotions, powders, perfumes, or deodorant  Do not wear nail polish including gel and S&S, artificial/acrylic nails, or any other type of covering on natural nails including finger and toenails. If you have artificial nails, gel coating, etc. that needs to be removed by a nail salon please have this removed prior to surgery or surgery may need to be canceled/ delayed if the surgeon/ anesthesia feels like they are unable to be safely monitored.   Do not shave  48 hours prior to surgery.    Do not bring valuables to the hospital. [place].   Contacts, glasses, dentures or bridgework may not be worn into surgery.   Bring small overnight bag day of surgery.   DO NOT [place]. PHARMACY WILL DISPENSE MEDICATIONS LISTED ON YOUR MEDICATION LIST TO YOU DURING YOUR ADMISSION [place]!   Special Instructions: Bring a copy of your healthcare power of attorney and living will documents the day of surgery if you haven't scanned them before.              Please read over the  following fact sheets you were given: IF Jessica Stevens [phone] Stevens   If you received a COVID test during your pre-op visit  it is requested that you wear a mask when out in public, stay away from anyone that may not be feeling well and notify your surgeon if you develop symptoms. If you test positive for Covid or have been in contact with anyone that has tested positive in the last 10 days please notify you surgeon.    [facility] - Preparing for Surgery Before surgery, you can play an important role.  Because skin is not sterile, your skin needs to be as free of germs as possible.  You can reduce the number of germs on your skin by washing with CHG (chlorahexidine gluconate) soap before surgery.  CHG is an antiseptic cleaner which kills germs and bonds with the skin to continue killing germs even after washing. Please DO NOT use if you have an allergy to CHG or antibacterial soaps.  If your skin becomes reddened/irritated stop using the CHG and inform your nurse when you arrive at Short Stay. Do not shave (including legs and underarms) for at least 48 hours prior to the first CHG shower.  You may shave your face/neck.  Please follow these instructions carefully:  1.  Shower with CHG Soap the night before surgery and the  morning of surgery.  2.  If you choose to wash your hair, wash your hair first as usual with your normal  shampoo.  3.  After you shampoo, rinse your hair and body thoroughly  to remove the shampoo.                             4.  Use CHG as you would any other liquid soap.  You can apply chg directly to the skin and wash.  Gently with a scrungie or clean washcloth.  5.  Apply the CHG Soap to your body ONLY FROM THE NECK DOWN.   Do   not use on face/ open                           Wound or open sores. Avoid contact with eyes, ears mouth and   genitals (private parts).                       Wash face,  Genitals (private parts)  with your normal soap.             6.  Wash thoroughly, paying special attention to the area where your    surgery  will be performed.  7.  Thoroughly rinse your body with warm water from the neck down.  8.  DO NOT shower/wash with your normal soap after using and rinsing off the CHG Soap.                9.  Pat yourself dry with a clean towel.            10.  Wear clean pajamas.            11.  Place clean sheets on your bed the night of your first shower and do not  sleep with pets. Day of Surgery : Do not apply any lotions/deodorants the morning of surgery.  Please wear clean clothes to the hospital/surgery center.  FAILURE TO FOLLOW THESE INSTRUCTIONS MAY RESULT IN THE CANCELLATION OF YOUR SURGERY  PATIENT SIGNATURE_________________________________  NURSE SIGNATURE__________________________________  ________________________________________________________________________  Jessica Stevens  An incentive spirometer is a tool that can help keep your lungs clear and active. This tool measures how well you are filling your lungs with each breath. Taking long deep breaths may help reverse or decrease the chance of developing breathing (pulmonary) problems (especially infection) following: A long period of time when you are unable to move or be active. BEFORE THE PROCEDURE  If the spirometer includes an indicator to show your best effort, your nurse or respiratory therapist will set it to a desired goal. If possible, sit up straight or lean slightly forward. Try not to slouch. Hold the incentive spirometer in an upright position. INSTRUCTIONS FOR USE  Sit on the edge of your bed if possible, or sit up as far as you can in bed or on a chair. Hold the incentive spirometer in an upright position. Breathe out normally. Place the mouthpiece in your mouth and seal your lips tightly around it. Breathe in slowly and as deeply as possible, raising the piston or the ball toward the top of the  column. Hold your breath for 3-5 seconds or for as long as possible. Allow the piston or ball to fall to the bottom of the column. Remove the mouthpiece from your mouth and breathe out normally. Rest for a few seconds and repeat Steps 1 through 7 at least 10 times every 1-2 hours when you are awake. Take your time and take a few normal breaths between deep breaths. The spirometer may include  an indicator to show your best effort. Use the indicator as a goal to work toward during each repetition. After each set of 10 deep breaths, practice coughing to be sure your lungs are clear. If you have an incision (the cut made at the time of surgery), support your incision when coughing by placing a pillow or rolled up towels firmly against it. Once you are able to get out of bed, walk around indoors and cough well. You may stop using the incentive spirometer when instructed by your caregiver.  RISKS AND COMPLICATIONS Take your time so you do not get dizzy or light-headed. If you are in pain, you may need to take or ask for pain medication before doing incentive spirometry. It is harder to take a deep breath if you are having pain. AFTER USE Rest and breathe slowly and easily. It can be helpful to keep track of a log of your progress. Your caregiver can provide you with a simple table to help with this. If you are using the spirometer at home, follow these instructions: [place] IF:  You are having difficultly using the spirometer. You have trouble using the spirometer as often as instructed. Your pain medication is not giving enough relief while using the spirometer. You develop fever of 100.5 F (38.1 C) or higher. SEEK IMMEDIATE MEDICAL CARE IF:  You cough up bloody sputum that had not been present before. You develop fever of 102 F (38.9 C) or greater. You develop worsening pain at or near the incision site. MAKE SURE YOU:  Understand these instructions. Will watch your  condition. Will get help right away if you are not doing well or get worse. Document Released: 06/26/2006 Document Revised: 05/08/2011 Document Reviewed: 08/27/2006 [facility] Patient Information 2014 [place], [place].   ________________________________________________________________________

## 2022-03-09 NOTE — Progress Notes (Addendum)
COVID Vaccine Completed: no  Date of COVID positive in last 90 days: no  PCP - Allyn Kenner, MD Cardiologist - n/a  Chest x-ray - n/a EKG - 03/13/22 Epic/chart Stress Test - n/a ECHO - n/a Cardiac Cath - n/a Pacemaker/ICD device last checked: n/a Spinal Cord Stimulator: n/a  Bowel Prep - no  Sleep Study - yes CPAP - no  Fasting Blood Sugar - n/a Checks Blood Sugar _____ times a day  Last dose of GLP1 agonist-  N/A GLP1 instructions:  N/A   Last dose of SGLT-2 inhibitors-  N/A SGLT-2 instructions: N/A   Blood Thinner Instructions: n/a Aspirin Instructions: Last Dose:  Activity level: Can go up a flight of stairs and perform activities of daily living without stopping and without symptoms of chest pain or shortness of breath.   Anesthesia review:   Patient denies shortness of breath, fever, cough and chest pain at PAT appointment  Patient verbalized understanding of instructions that were given to them at the PAT appointment. Patient was also instructed that they will need to review over the PAT instructions again at home before surgery.

## 2022-03-13 ENCOUNTER — Encounter (HOSPITAL_COMMUNITY)
Admission: RE | Admit: 2022-03-13 | Discharge: 2022-03-13 | Disposition: A | Discharge status: Home / Self Care | Payer: 59 | Source: Ambulatory Visit | Attending: Orthopedic Surgery | Admitting: Orthopedic Surgery

## 2022-03-13 ENCOUNTER — Encounter (HOSPITAL_COMMUNITY): Payer: Self-pay

## 2022-03-13 VITALS — BP 129/92 | HR 66 | Temp 98.3°F | Resp 14 | Ht 63.0 in | Wt 171.0 lb

## 2022-03-13 DIAGNOSIS — Z01818 Encounter for other preprocedural examination: Secondary | ICD-10-CM

## 2022-03-13 DIAGNOSIS — I10 Essential (primary) hypertension: Secondary | ICD-10-CM | POA: Insufficient documentation

## 2022-03-13 LAB — BASIC METABOLIC PANEL
Anion gap: 9 (ref 5–15)
BUN: 17 mg/dL (ref 6–20)
CO2: 28 mmol/L (ref 22–32)
Calcium: 9.8 mg/dL (ref 8.9–10.3)
Chloride: 104 mmol/L (ref 98–111)
Creatinine, Ser: 1.03 mg/dL — ABNORMAL HIGH (ref 0.44–1.00)
GFR, Estimated: >60 mL/min (ref >60)
Glucose, Bld: 93 mg/dL (ref 70–99)
Potassium: 4.4 mmol/L (ref 3.5–5.1)
Sodium: 141 mmol/L (ref 135–145)

## 2022-03-13 LAB — CBC
HCT: 35.6 % — ABNORMAL LOW (ref 36.0–46.0)
Hemoglobin: 11.2 g/dL — ABNORMAL LOW (ref 12.0–15.0)
MCH: 28.9 pg (ref 26.0–34.0)
MCHC: 31.5 g/dL (ref 30.0–36.0)
MCV: 91.8 fL (ref 80.0–100.0)
Platelets: 292 10*3/uL (ref 150–400)
RBC: 3.88 MIL/uL (ref 3.87–5.11)
RDW: 15 % (ref 11.5–15.5)
WBC: 6.3 10*3/uL (ref 4.0–10.5)
nRBC: 0 % (ref 0.0–0.2)

## 2022-03-13 LAB — SURGICAL PCR SCREEN
MRSA, PCR: NEGATIVE
Staphylococcus aureus: NEGATIVE

## 2022-03-20 ENCOUNTER — Ambulatory Visit (HOSPITAL_COMMUNITY)
Admission: RE | Admit: 2022-03-20 | Discharge: 2022-03-20 | Disposition: A | Discharge status: Home / Self Care | Payer: 59 | Source: Ambulatory Visit | Attending: Nurse Practitioner | Admitting: Nurse Practitioner

## 2022-03-20 DIAGNOSIS — Z1231 Encounter for screening mammogram for malignant neoplasm of breast: Secondary | ICD-10-CM | POA: Diagnosis not present

## 2022-03-20 IMAGING — DX DG SHOULDER 2+V PORT*R*
1 series · 1 of 1 positions shown · non-contrast
Comparison: MRI 03/13/2020.

CLINICAL DATA: Right shoulder replacement.

EXAM:
PORTABLE RIGHT SHOULDER

[shoulder ap]
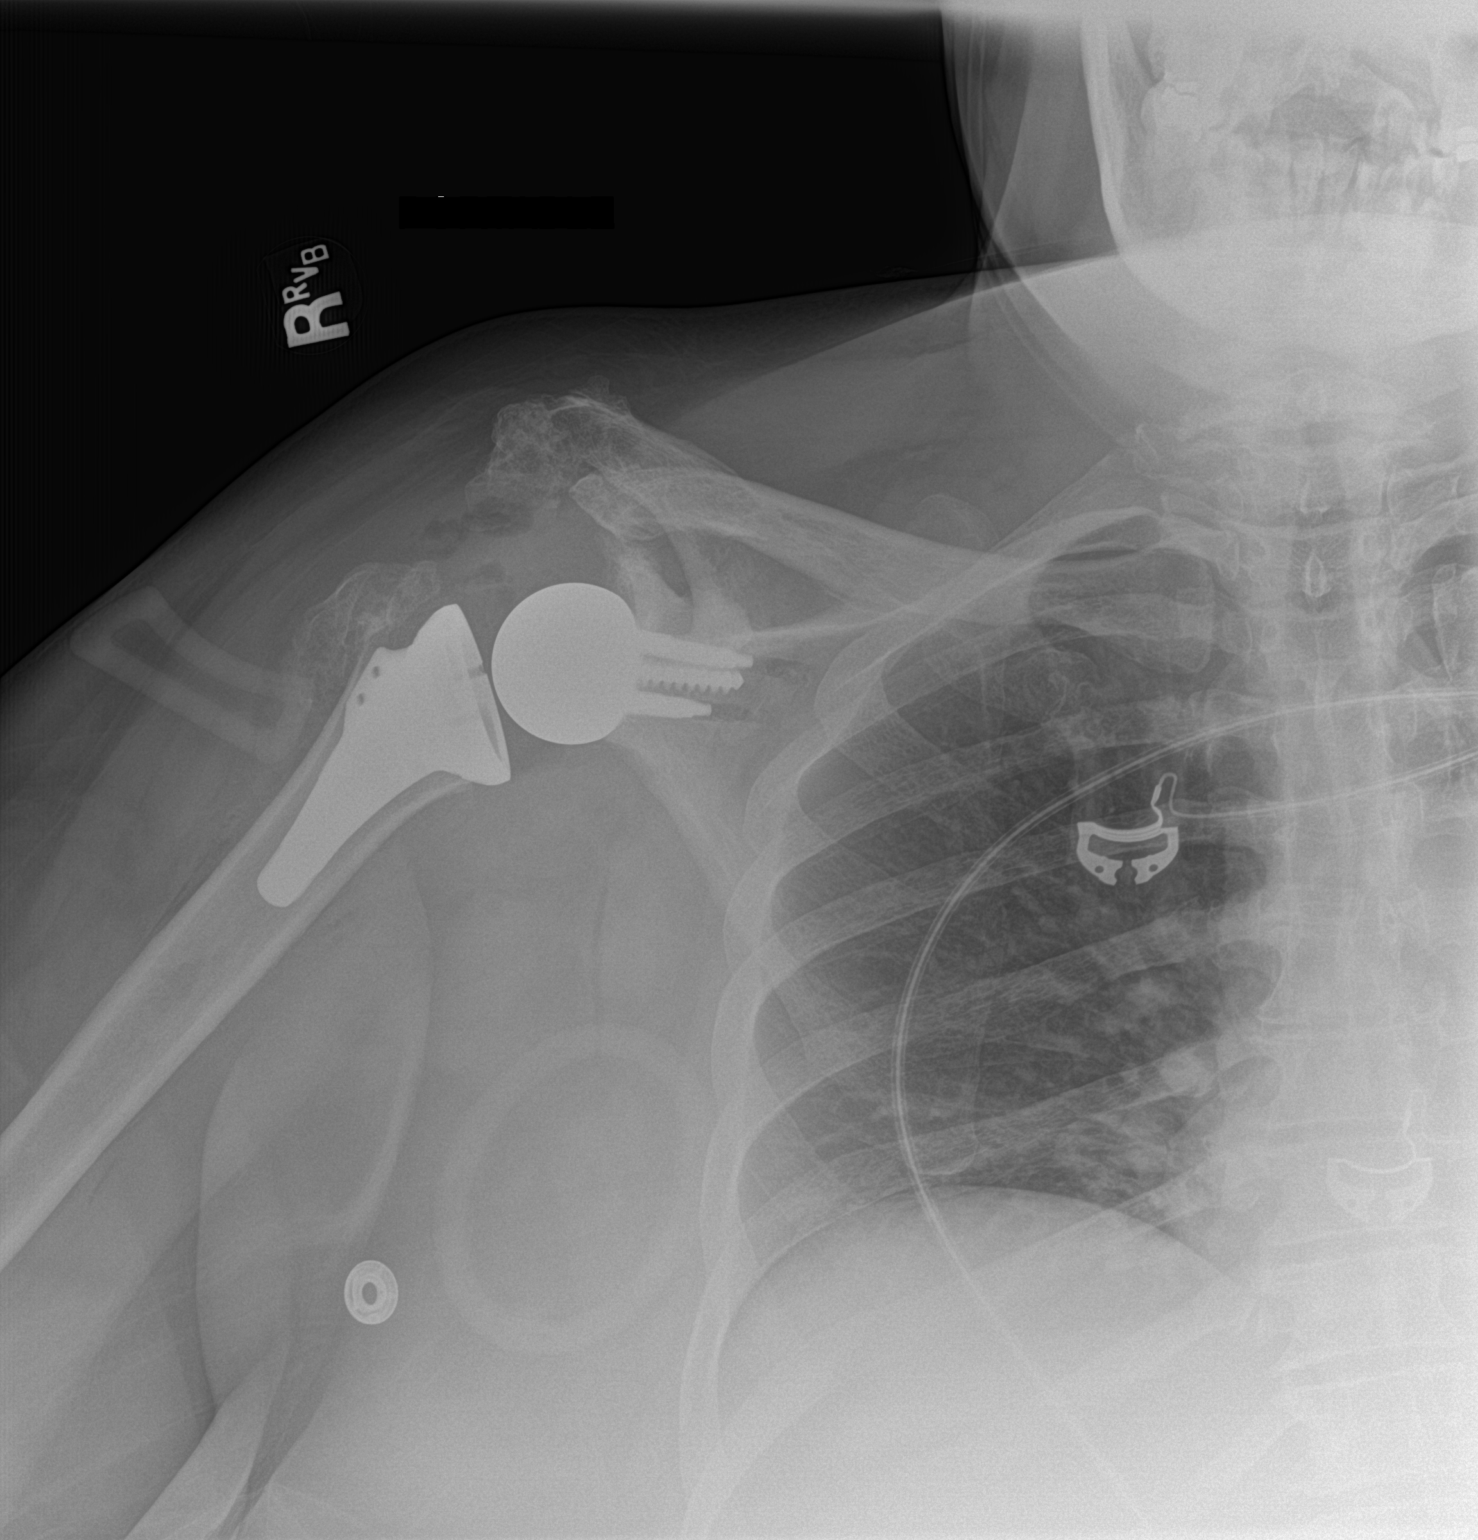

[1 of 1 positions shown; findings below may reference images not displayed]

FINDINGS: Total right shoulder replacement. Hardware intact. Anatomic
alignment on AP view. Prominent acromioclavicular degenerative
change.
IMPRESSION: Total right shoulder replacement.  Anatomic alignment on AP view.

## 2022-03-22 ENCOUNTER — Other Ambulatory Visit (HOSPITAL_COMMUNITY): Payer: Self-pay | Admitting: Family Medicine

## 2022-03-22 DIAGNOSIS — M533 Sacrococcygeal disorders, not elsewhere classified: Secondary | ICD-10-CM | POA: Diagnosis not present

## 2022-03-22 DIAGNOSIS — Z713 Dietary counseling and surveillance: Secondary | ICD-10-CM | POA: Diagnosis not present

## 2022-03-22 DIAGNOSIS — M25562 Pain in left knee: Secondary | ICD-10-CM | POA: Diagnosis not present

## 2022-03-22 DIAGNOSIS — Z9884 Bariatric surgery status: Secondary | ICD-10-CM | POA: Diagnosis not present

## 2022-03-22 DIAGNOSIS — E538 Deficiency of other specified B group vitamins: Secondary | ICD-10-CM | POA: Diagnosis not present

## 2022-03-22 DIAGNOSIS — M545 Low back pain, unspecified: Secondary | ICD-10-CM | POA: Diagnosis not present

## 2022-03-23 DIAGNOSIS — E538 Deficiency of other specified B group vitamins: Secondary | ICD-10-CM | POA: Diagnosis not present

## 2022-03-24 ENCOUNTER — Observation Stay (HOSPITAL_COMMUNITY)
Admission: RE | Admit: 2022-03-24 | Discharge: 2022-03-25 | Disposition: A | Discharge status: Home / Self Care | Payer: 59 | Attending: Orthopedic Surgery | Admitting: Orthopedic Surgery

## 2022-03-24 ENCOUNTER — Encounter (HOSPITAL_COMMUNITY)
Admission: RE | Disposition: A | Discharge status: Home / Self Care | Payer: Self-pay | Source: Home / Self Care | Attending: Orthopedic Surgery

## 2022-03-24 ENCOUNTER — Encounter (HOSPITAL_COMMUNITY): Payer: Self-pay | Admitting: Orthopedic Surgery

## 2022-03-24 ENCOUNTER — Other Ambulatory Visit: Payer: Self-pay

## 2022-03-24 ENCOUNTER — Ambulatory Visit (HOSPITAL_COMMUNITY): Payer: 59 | Admitting: Anesthesiology

## 2022-03-24 ENCOUNTER — Ambulatory Visit (HOSPITAL_BASED_OUTPATIENT_CLINIC_OR_DEPARTMENT_OTHER): Payer: 59 | Admitting: Anesthesiology

## 2022-03-24 DIAGNOSIS — Z96611 Presence of right artificial shoulder joint: Secondary | ICD-10-CM | POA: Insufficient documentation

## 2022-03-24 DIAGNOSIS — M2342 Loose body in knee, left knee: Secondary | ICD-10-CM | POA: Diagnosis not present

## 2022-03-24 DIAGNOSIS — E039 Hypothyroidism, unspecified: Secondary | ICD-10-CM | POA: Insufficient documentation

## 2022-03-24 DIAGNOSIS — M1712 Unilateral primary osteoarthritis, left knee: Secondary | ICD-10-CM

## 2022-03-24 DIAGNOSIS — M21162 Varus deformity, not elsewhere classified, left knee: Secondary | ICD-10-CM | POA: Diagnosis not present

## 2022-03-24 DIAGNOSIS — J45909 Unspecified asthma, uncomplicated: Secondary | ICD-10-CM | POA: Insufficient documentation

## 2022-03-24 DIAGNOSIS — Z96652 Presence of left artificial knee joint: Secondary | ICD-10-CM

## 2022-03-24 DIAGNOSIS — I10 Essential (primary) hypertension: Secondary | ICD-10-CM | POA: Diagnosis not present

## 2022-03-24 DIAGNOSIS — Z79899 Other long term (current) drug therapy: Secondary | ICD-10-CM | POA: Insufficient documentation

## 2022-03-24 DIAGNOSIS — G8918 Other acute postprocedural pain: Secondary | ICD-10-CM | POA: Diagnosis not present

## 2022-03-24 HISTORY — PX: TOTAL KNEE ARTHROPLASTY: SHX125

## 2022-03-24 SURGERY — ARTHROPLASTY, KNEE, TOTAL
Anesthesia: Spinal | Site: Knee | Laterality: Left

## 2022-03-24 MED ORDER — CHLORHEXIDINE GLUCONATE 0.12 % MT SOLN
15.0000 mL | Freq: Once | OROMUCOSAL | Status: AC
  Administered 2022-03-24: 15 mL via OROMUCOSAL

## 2022-03-24 MED ORDER — LIDOCAINE HCL (PF) 2 % IJ SOLN
INTRAMUSCULAR | Status: AC
  Filled 2022-03-24: qty 5

## 2022-03-24 MED ORDER — HAIR SKIN & NAILS PO TABS: 1.0000 | ORAL_TABLET | Freq: Every day | ORAL | Status: DC

## 2022-03-24 MED ORDER — HYDROMORPHONE HCL 1 MG/ML IJ SOLN: 0.5000 mg | INTRAMUSCULAR | Status: DC | PRN

## 2022-03-24 MED ORDER — BUPIVACAINE LIPOSOME 1.3 % IJ SUSP
INTRAMUSCULAR | Status: DC | PRN
  Administered 2022-03-24: 20 mL

## 2022-03-24 MED ORDER — ONDANSETRON HCL 4 MG PO TABS: 4.0000 mg | ORAL_TABLET | Freq: Three times a day (TID) | ORAL | 1 refills | Status: AC | PRN

## 2022-03-24 MED ORDER — TIZANIDINE HCL 4 MG PO TABS
2.0000 mg | ORAL_TABLET | Freq: Three times a day (TID) | ORAL | Status: DC
  Administered 2022-03-24 – 2022-03-25 (×2): 2 mg via ORAL
  Filled 2022-03-24 (×2): qty 1

## 2022-03-24 MED ORDER — DOCUSATE SODIUM 100 MG PO CAPS
100.0000 mg | ORAL_CAPSULE | Freq: Two times a day (BID) | ORAL | Status: DC
  Administered 2022-03-24 – 2022-03-25 (×2): 100 mg via ORAL
  Filled 2022-03-24 (×2): qty 1

## 2022-03-24 MED ORDER — BUPIVACAINE LIPOSOME 1.3 % IJ SUSP
INTRAMUSCULAR | Status: AC
  Filled 2022-03-24: qty 20

## 2022-03-24 MED ORDER — GABAPENTIN 100 MG PO CAPS
200.0000 mg | ORAL_CAPSULE | Freq: Two times a day (BID) | ORAL | Status: DC
  Administered 2022-03-24 – 2022-03-25 (×2): 200 mg via ORAL
  Filled 2022-03-24 (×2): qty 2

## 2022-03-24 MED ORDER — ORAL CARE MOUTH RINSE: 15.0000 mL | Freq: Once | OROMUCOSAL | Status: AC

## 2022-03-24 MED ORDER — ACETAMINOPHEN 500 MG PO TABS
1000.0000 mg | ORAL_TABLET | Freq: Once | ORAL | Status: AC
  Administered 2022-03-24: 1000 mg via ORAL
  Filled 2022-03-24: qty 2

## 2022-03-24 MED ORDER — DULOXETINE HCL 60 MG PO CPEP
60.0000 mg | ORAL_CAPSULE | Freq: Two times a day (BID) | ORAL | Status: DC
  Administered 2022-03-24 – 2022-03-25 (×2): 60 mg via ORAL
  Filled 2022-03-24 (×2): qty 1

## 2022-03-24 MED ORDER — TRANEXAMIC ACID-NACL 1000-0.7 MG/100ML-% IV SOLN
1000.0000 mg | Freq: Once | INTRAVENOUS | Status: AC
  Administered 2022-03-24: 1000 mg via INTRAVENOUS
  Filled 2022-03-24: qty 100

## 2022-03-24 MED ORDER — SODIUM CHLORIDE 0.9 % IV SOLN: INTRAVENOUS | Status: DC

## 2022-03-24 MED ORDER — ASPIRIN 81 MG PO CHEW: 81.0000 mg | CHEWABLE_TABLET | Freq: Two times a day (BID) | ORAL | 0 refills | Status: AC

## 2022-03-24 MED ORDER — BISACODYL 10 MG RE SUPP: 10.0000 mg | Freq: Every day | RECTAL | Status: DC | PRN

## 2022-03-24 MED ORDER — LACTATED RINGERS IV SOLN: INTRAVENOUS | Status: DC

## 2022-03-24 MED ORDER — ONDANSETRON HCL 4 MG/2ML IJ SOLN: 4.0000 mg | Freq: Four times a day (QID) | INTRAMUSCULAR | Status: DC | PRN

## 2022-03-24 MED ORDER — EPHEDRINE SULFATE (PRESSORS) 50 MG/ML IJ SOLN
INTRAMUSCULAR | Status: DC | PRN
  Administered 2022-03-24: 5 mg via INTRAVENOUS
  Administered 2022-03-24: 10 mg via INTRAVENOUS

## 2022-03-24 MED ORDER — AMLODIPINE BESYLATE 10 MG PO TABS
10.0000 mg | ORAL_TABLET | Freq: Every day | ORAL | Status: DC
  Administered 2022-03-25: 10 mg via ORAL
  Filled 2022-03-24: qty 1

## 2022-03-24 MED ORDER — GLYCOPYRROLATE 0.2 MG/ML IJ SOLN
INTRAMUSCULAR | Status: AC
  Filled 2022-03-24: qty 1

## 2022-03-24 MED ORDER — METOCLOPRAMIDE HCL 5 MG PO TABS: 5.0000 mg | ORAL_TABLET | Freq: Three times a day (TID) | ORAL | Status: DC | PRN

## 2022-03-24 MED ORDER — ROSUVASTATIN CALCIUM 20 MG PO TABS
20.0000 mg | ORAL_TABLET | Freq: Every day | ORAL | Status: DC
  Administered 2022-03-25: 20 mg via ORAL
  Filled 2022-03-24: qty 1

## 2022-03-24 MED ORDER — PHENYLEPHRINE HCL-NACL 20-0.9 MG/250ML-% IV SOLN
INTRAVENOUS | Status: DC | PRN
  Administered 2022-03-24: 30 ug/min via INTRAVENOUS

## 2022-03-24 MED ORDER — HYDROMORPHONE HCL 2 MG PO TABS
1.0000 mg | ORAL_TABLET | ORAL | Status: DC | PRN
  Administered 2022-03-24 – 2022-03-25 (×3): 2 mg via ORAL
  Filled 2022-03-24 (×3): qty 1

## 2022-03-24 MED ORDER — DEXAMETHASONE SODIUM PHOSPHATE 10 MG/ML IJ SOLN
INTRAMUSCULAR | Status: AC
  Filled 2022-03-24: qty 1

## 2022-03-24 MED ORDER — POVIDONE-IODINE 10 % EX SWAB
2.0000 | Freq: Once | CUTANEOUS | Status: AC
  Administered 2022-03-24: 2 via TOPICAL

## 2022-03-24 MED ORDER — PROPOFOL 500 MG/50ML IV EMUL
INTRAVENOUS | Status: DC | PRN
  Administered 2022-03-24: 50 ug/kg/min via INTRAVENOUS

## 2022-03-24 MED ORDER — ALBUTEROL SULFATE (2.5 MG/3ML) 0.083% IN NEBU: 2.5000 mg | INHALATION_SOLUTION | Freq: Two times a day (BID) | RESPIRATORY_TRACT | Status: DC | PRN

## 2022-03-24 MED ORDER — ONDANSETRON HCL 4 MG/2ML IJ SOLN
INTRAMUSCULAR | Status: DC | PRN
  Administered 2022-03-24: 4 mg via INTRAVENOUS

## 2022-03-24 MED ORDER — BUPROPION HCL ER (XL) 150 MG PO TB24
150.0000 mg | ORAL_TABLET | Freq: Two times a day (BID) | ORAL | Status: DC
  Administered 2022-03-24 – 2022-03-25 (×2): 150 mg via ORAL
  Filled 2022-03-24 (×2): qty 1

## 2022-03-24 MED ORDER — GLYCOPYRROLATE 0.2 MG/ML IJ SOLN
INTRAMUSCULAR | Status: DC | PRN
  Administered 2022-03-24 (×2): .1 mg via INTRAVENOUS

## 2022-03-24 MED ORDER — PHENYLEPHRINE HCL-NACL 20-0.9 MG/250ML-% IV SOLN
INTRAVENOUS | Status: AC
  Filled 2022-03-24: qty 250

## 2022-03-24 MED ORDER — FENTANYL CITRATE (PF) 100 MCG/2ML IJ SOLN
INTRAMUSCULAR | Status: DC | PRN
  Administered 2022-03-24: 50 ug via INTRAVENOUS
  Administered 2022-03-24 (×2): 25 ug via INTRAVENOUS

## 2022-03-24 MED ORDER — FENTANYL CITRATE (PF) 100 MCG/2ML IJ SOLN
INTRAMUSCULAR | Status: AC
  Filled 2022-03-24: qty 2

## 2022-03-24 MED ORDER — MIDAZOLAM HCL 5 MG/5ML IJ SOLN
INTRAMUSCULAR | Status: DC | PRN
  Administered 2022-03-24 (×2): 1 mg via INTRAVENOUS

## 2022-03-24 MED ORDER — EPHEDRINE 5 MG/ML INJ
INTRAVENOUS | Status: AC
  Filled 2022-03-24: qty 5

## 2022-03-24 MED ORDER — LOSARTAN POTASSIUM 50 MG PO TABS
100.0000 mg | ORAL_TABLET | Freq: Every day | ORAL | Status: DC
  Administered 2022-03-25: 100 mg via ORAL
  Filled 2022-03-24: qty 2

## 2022-03-24 MED ORDER — HYDROMORPHONE HCL 1 MG/ML IJ SOLN: 0.2500 mg | INTRAMUSCULAR | Status: DC | PRN

## 2022-03-24 MED ORDER — CEFAZOLIN SODIUM-DEXTROSE 2-4 GM/100ML-% IV SOLN
2.0000 g | Freq: Four times a day (QID) | INTRAVENOUS | Status: AC
  Administered 2022-03-24 – 2022-03-25 (×2): 2 g via INTRAVENOUS
  Filled 2022-03-24 (×2): qty 100

## 2022-03-24 MED ORDER — BUPIVACAINE-EPINEPHRINE 0.25% -1:200000 IJ SOLN
INTRAMUSCULAR | Status: DC | PRN
  Administered 2022-03-24: 30 mL

## 2022-03-24 MED ORDER — TRAMADOL HCL 50 MG PO TABS
50.0000 mg | ORAL_TABLET | Freq: Four times a day (QID) | ORAL | Status: DC | PRN
  Administered 2022-03-25: 50 mg via ORAL
  Filled 2022-03-24: qty 1

## 2022-03-24 MED ORDER — LIDOCAINE HCL (CARDIAC) PF 100 MG/5ML IV SOSY
PREFILLED_SYRINGE | INTRAVENOUS | Status: DC | PRN
  Administered 2022-03-24: 20 mg via INTRAVENOUS

## 2022-03-24 MED ORDER — BUPIVACAINE IN DEXTROSE 0.75-8.25 % IT SOLN
INTRATHECAL | Status: DC | PRN
  Administered 2022-03-24: 1.6 mL via INTRATHECAL

## 2022-03-24 MED ORDER — ASPIRIN 81 MG PO CHEW
81.0000 mg | CHEWABLE_TABLET | Freq: Two times a day (BID) | ORAL | Status: DC
  Administered 2022-03-24 – 2022-03-25 (×2): 81 mg via ORAL
  Filled 2022-03-24 (×2): qty 1

## 2022-03-24 MED ORDER — MIDAZOLAM HCL 2 MG/2ML IJ SOLN
1.0000 mg | Freq: Once | INTRAMUSCULAR | Status: AC
  Administered 2022-03-24: 2 mg via INTRAVENOUS
  Filled 2022-03-24: qty 2

## 2022-03-24 MED ORDER — ONDANSETRON HCL 4 MG PO TABS: 4.0000 mg | ORAL_TABLET | Freq: Four times a day (QID) | ORAL | Status: DC | PRN

## 2022-03-24 MED ORDER — HYDROMORPHONE HCL 2 MG PO TABS: 2.0000 mg | ORAL_TABLET | ORAL | 0 refills | Status: AC | PRN

## 2022-03-24 MED ORDER — 0.9 % SODIUM CHLORIDE (POUR BTL) OPTIME
TOPICAL | Status: DC | PRN
  Administered 2022-03-24: 1000 mL

## 2022-03-24 MED ORDER — ALBUTEROL SULFATE HFA 108 (90 BASE) MCG/ACT IN AERS: 2.0000 | INHALATION_SPRAY | Freq: Two times a day (BID) | RESPIRATORY_TRACT | Status: DC | PRN

## 2022-03-24 MED ORDER — DEXAMETHASONE SODIUM PHOSPHATE 10 MG/ML IJ SOLN
INTRAMUSCULAR | Status: DC | PRN
  Administered 2022-03-24: 8 mg via INTRAVENOUS

## 2022-03-24 MED ORDER — TRANEXAMIC ACID-NACL 1000-0.7 MG/100ML-% IV SOLN
1000.0000 mg | INTRAVENOUS | Status: AC
  Administered 2022-03-24: 1000 mg via INTRAVENOUS
  Filled 2022-03-24: qty 100

## 2022-03-24 MED ORDER — PROPOFOL 1000 MG/100ML IV EMUL
INTRAVENOUS | Status: AC
  Filled 2022-03-24: qty 100

## 2022-03-24 MED ORDER — FENTANYL CITRATE PF 50 MCG/ML IJ SOSY
50.0000 ug | PREFILLED_SYRINGE | Freq: Once | INTRAMUSCULAR | Status: AC
  Administered 2022-03-24: 50 ug via INTRAVENOUS
  Filled 2022-03-24: qty 2

## 2022-03-24 MED ORDER — BUPIVACAINE HCL 0.25 % IJ SOLN
INTRAMUSCULAR | Status: AC
  Filled 2022-03-24: qty 1

## 2022-03-24 MED ORDER — MIDAZOLAM HCL 2 MG/2ML IJ SOLN
INTRAMUSCULAR | Status: AC
  Filled 2022-03-24: qty 2

## 2022-03-24 MED ORDER — BUSPIRONE HCL 5 MG PO TABS: 5.0000 mg | ORAL_TABLET | Freq: Two times a day (BID) | ORAL | Status: DC | PRN

## 2022-03-24 MED ORDER — WATER FOR IRRIGATION, STERILE IR SOLN
Status: DC | PRN
  Administered 2022-03-24: 2000 mL

## 2022-03-24 MED ORDER — CEFAZOLIN SODIUM-DEXTROSE 2-4 GM/100ML-% IV SOLN
2.0000 g | INTRAVENOUS | Status: AC
  Administered 2022-03-24: 2 g via INTRAVENOUS
  Filled 2022-03-24: qty 100

## 2022-03-24 MED ORDER — ACETAMINOPHEN 325 MG PO TABS: 325.0000 mg | ORAL_TABLET | Freq: Four times a day (QID) | ORAL | Status: DC | PRN

## 2022-03-24 MED ORDER — MONTELUKAST SODIUM 10 MG PO TABS: 10.0000 mg | ORAL_TABLET | Freq: Every evening | ORAL | Status: DC | PRN

## 2022-03-24 MED ORDER — BUPIVACAINE-EPINEPHRINE (PF) 0.5% -1:200000 IJ SOLN
INTRAMUSCULAR | Status: DC | PRN
  Administered 2022-03-24: 20 mL via PERINEURAL

## 2022-03-24 MED ORDER — LEVOCETIRIZINE DIHYDROCHLORIDE 5 MG PO TABS: 5.0000 mg | ORAL_TABLET | Freq: Every evening | ORAL | Status: DC

## 2022-03-24 MED ORDER — UMECLIDINIUM-VILANTEROL 62.5-25 MCG/ACT IN AEPB
1.0000 | INHALATION_SPRAY | Freq: Every day | RESPIRATORY_TRACT | Status: DC
  Administered 2022-03-25: 1 via RESPIRATORY_TRACT
  Filled 2022-03-24: qty 14

## 2022-03-24 MED ORDER — CYANOCOBALAMIN 1000 MCG/ML IJ SOLN: 1000.0000 ug | INTRAMUSCULAR | Status: DC

## 2022-03-24 MED ORDER — POTASSIUM CHLORIDE CRYS ER 10 MEQ PO TBCR
10.0000 meq | EXTENDED_RELEASE_TABLET | Freq: Every day | ORAL | Status: DC
  Administered 2022-03-25: 10 meq via ORAL
  Filled 2022-03-24: qty 1

## 2022-03-24 MED ORDER — DICLOFENAC SODIUM 1 % EX GEL: 2.0000 g | Freq: Every day | CUTANEOUS | Status: DC | PRN

## 2022-03-24 MED ORDER — DEXMEDETOMIDINE HCL IN NACL 80 MCG/20ML IV SOLN
INTRAVENOUS | Status: DC | PRN
  Administered 2022-03-24: 8 ug via BUCCAL

## 2022-03-24 MED ORDER — BUPIVACAINE LIPOSOME 1.3 % IJ SUSP: 20.0000 mL | Freq: Once | INTRAMUSCULAR | Status: DC

## 2022-03-24 MED ORDER — SODIUM CHLORIDE (PF) 0.9 % IJ SOLN
INTRAMUSCULAR | Status: DC | PRN
  Administered 2022-03-24: 30 mL

## 2022-03-24 MED ORDER — SODIUM CHLORIDE 0.9 % IR SOLN
Status: DC | PRN
  Administered 2022-03-24: 3000 mL

## 2022-03-24 MED ORDER — SODIUM CHLORIDE (PF) 0.9 % IJ SOLN
INTRAMUSCULAR | Status: AC
  Filled 2022-03-24: qty 50

## 2022-03-24 MED ORDER — LORATADINE 10 MG PO TABS
10.0000 mg | ORAL_TABLET | Freq: Every day | ORAL | Status: DC
  Administered 2022-03-24: 10 mg via ORAL
  Filled 2022-03-24: qty 1

## 2022-03-24 MED ORDER — ONDANSETRON 4 MG PO TBDP: 4.0000 mg | ORAL_TABLET | Freq: Four times a day (QID) | ORAL | Status: DC | PRN

## 2022-03-24 MED ORDER — PHENOL 1.4 % MT LIQD: 1.0000 | OROMUCOSAL | Status: DC | PRN

## 2022-03-24 MED ORDER — HYDRALAZINE HCL 25 MG PO TABS
25.0000 mg | ORAL_TABLET | Freq: Two times a day (BID) | ORAL | Status: DC
  Administered 2022-03-24 – 2022-03-25 (×2): 25 mg via ORAL
  Filled 2022-03-24 (×2): qty 1

## 2022-03-24 MED ORDER — ONDANSETRON HCL 4 MG/2ML IJ SOLN
INTRAMUSCULAR | Status: AC
  Filled 2022-03-24: qty 2

## 2022-03-24 MED ORDER — FUROSEMIDE 20 MG PO TABS
20.0000 mg | ORAL_TABLET | Freq: Every morning | ORAL | Status: DC
  Administered 2022-03-25: 20 mg via ORAL
  Filled 2022-03-24: qty 1

## 2022-03-24 MED ORDER — MENTHOL 3 MG MT LOZG: 1.0000 | LOZENGE | OROMUCOSAL | Status: DC | PRN

## 2022-03-24 MED ORDER — POLYETHYLENE GLYCOL 3350 17 G PO PACK: 17.0000 g | PACK | Freq: Every day | ORAL | Status: DC | PRN

## 2022-03-24 MED ORDER — PANTOPRAZOLE SODIUM 40 MG PO TBEC
40.0000 mg | DELAYED_RELEASE_TABLET | Freq: Every day | ORAL | Status: DC
  Administered 2022-03-25: 40 mg via ORAL
  Filled 2022-03-24: qty 1

## 2022-03-24 MED ORDER — MELOXICAM 15 MG PO TABS
15.0000 mg | ORAL_TABLET | Freq: Every day | ORAL | Status: DC
  Administered 2022-03-25: 15 mg via ORAL
  Filled 2022-03-24: qty 1

## 2022-03-24 MED ORDER — LEVOTHYROXINE SODIUM 112 MCG PO TABS
112.0000 ug | ORAL_TABLET | Freq: Every day | ORAL | Status: DC
  Administered 2022-03-25: 112 ug via ORAL
  Filled 2022-03-24: qty 1

## 2022-03-24 MED ORDER — TIZANIDINE HCL 4 MG PO TABS: 4.0000 mg | ORAL_TABLET | Freq: Three times a day (TID) | ORAL | 1 refills | Status: DC | PRN

## 2022-03-24 MED ORDER — METOCLOPRAMIDE HCL 5 MG/ML IJ SOLN: 5.0000 mg | Freq: Three times a day (TID) | INTRAMUSCULAR | Status: DC | PRN

## 2022-03-24 SURGICAL SUPPLY — 54 items
ATTUNE PS FEM LT SZ 5 CEM KNEE (Femur) IMPLANT
BAG COUNTER SPONGE SURGICOUNT (BAG) IMPLANT
BAG SPEC THK2 15X12 ZIP CLS (MISCELLANEOUS)
BAG SPNG CNTER NS LX DISP (BAG)
BAG ZIPLOCK 12X15 (MISCELLANEOUS) IMPLANT
BASE TIBIAL ROT PLAT SZ 5 KNEE (Knees) IMPLANT
BLADE SAG 18X100X1.27 (BLADE) ×1 IMPLANT
BLADE SAW SGTL 13X75X1.27 (BLADE) ×1 IMPLANT
BNDG CMPR MED 10X6 ELC LF (GAUZE/BANDAGES/DRESSINGS) ×1
BNDG ELASTIC 6X10 VLCR STRL LF (GAUZE/BANDAGES/DRESSINGS) ×1 IMPLANT
BNDG GAUZE DERMACEA FLUFF 4 (GAUZE/BANDAGES/DRESSINGS) ×1 IMPLANT
BNDG GZE DERMACEA 4 6PLY (GAUZE/BANDAGES/DRESSINGS) ×1
BOWL SMART MIX CTS (DISPOSABLE) ×1 IMPLANT
BSPLAT TIB 5 CMNT ROT PLAT STR (Knees) ×1 IMPLANT
CEMENT HV SMART SET (Cement) ×2 IMPLANT
COVER SURGICAL LIGHT HANDLE (MISCELLANEOUS) ×1 IMPLANT
CUFF TOURN SGL QUICK 34 (TOURNIQUET CUFF) ×1
CUFF TRNQT CYL 34X4.125X (TOURNIQUET CUFF) ×1 IMPLANT
DRAPE INCISE IOBAN 66X45 STRL (DRAPES) ×1 IMPLANT
DRAPE SHEET LG 3/4 BI-LAMINATE (DRAPES) ×1 IMPLANT
DRAPE U-SHAPE 47X51 STRL (DRAPES) ×1 IMPLANT
DRSG ADAPTIC 3X8 NADH LF (GAUZE/BANDAGES/DRESSINGS) ×1 IMPLANT
DURAPREP 26ML APPLICATOR (WOUND CARE) ×1 IMPLANT
ELECT REM PT RETURN 15FT ADLT (MISCELLANEOUS) ×1 IMPLANT
GAUZE PAD ABD 8X10 STRL (GAUZE/BANDAGES/DRESSINGS) ×1 IMPLANT
GAUZE SPONGE 4X4 12PLY STRL (GAUZE/BANDAGES/DRESSINGS) ×1 IMPLANT
GLOVE BIOGEL PI IND STRL 7.5 (GLOVE) ×1 IMPLANT
GLOVE BIOGEL PI IND STRL 8.5 (GLOVE) ×1 IMPLANT
GLOVE ORTHO TXT STRL SZ7.5 (GLOVE) ×1 IMPLANT
GLOVE SURG ORTHO 8.5 STRL (GLOVE) ×2 IMPLANT
GOWN STRL REUS W/ TWL XL LVL3 (GOWN DISPOSABLE) ×2 IMPLANT
GOWN STRL REUS W/TWL XL LVL3 (GOWN DISPOSABLE) ×2
HANDPIECE INTERPULSE COAX TIP (DISPOSABLE) ×1
HOLDER FOLEY CATH W/STRAP (MISCELLANEOUS) IMPLANT
IMMOBILIZER KNEE 20 (SOFTGOODS) ×1
IMMOBILIZER KNEE 20 THIGH 36 (SOFTGOODS) IMPLANT
INSERT TIB ATTUNE RP SZ5X14 (Insert) IMPLANT
KIT TURNOVER KIT A (KITS) IMPLANT
MANIFOLD NEPTUNE II (INSTRUMENTS) ×1 IMPLANT
NS IRRIG 1000ML POUR BTL (IV SOLUTION) ×1 IMPLANT
PACK TOTAL KNEE CUSTOM (KITS) ×1 IMPLANT
PATELLA MEDIAL ATTUN 35MM KNEE (Knees) IMPLANT
PIN STEINMAN FIXATION KNEE (PIN) IMPLANT
PROTECTOR NERVE ULNAR (MISCELLANEOUS) ×1 IMPLANT
SET HNDPC FAN SPRY TIP SCT (DISPOSABLE) ×1 IMPLANT
STAPLER VISISTAT 35W (STAPLE) IMPLANT
STRIP CLOSURE SKIN 1/2X4 (GAUZE/BANDAGES/DRESSINGS) ×2 IMPLANT
SUT MNCRL AB 3-0 PS2 18 (SUTURE) ×1 IMPLANT
SUT VIC AB 0 CT1 36 (SUTURE) ×1 IMPLANT
SUT VIC AB 1 CT1 36 (SUTURE) ×3 IMPLANT
SUT VIC AB 2-0 CT1 27 (SUTURE) ×1
SUT VIC AB 2-0 CT1 TAPERPNT 27 (SUTURE) ×1 IMPLANT
TIBIAL BASE ROT PLAT SZ 5 KNEE (Knees) ×1 IMPLANT
WATER STERILE IRR 1000ML POUR (IV SOLUTION) ×2 IMPLANT

## 2022-03-24 NOTE — Anesthesia Procedure Notes (Signed)
Spinal  Patient location during procedure: OR Start time: 03/24/2022 1:35 PM End time: 03/24/2022 1:40 PM Reason for block: surgical anesthesia Staffing Performed: anesthesiologist and resident/CRNA  Resident/CRNA: Garrel Ridgel, CRNA Performed by: Garrel Ridgel, CRNA Authorized by: Roderic Palau, MD   Preanesthetic Checklist Completed: patient identified, IV checked, site marked, risks and benefits discussed, surgical consent, monitors and equipment checked, pre-op evaluation and timeout performed Spinal Block Patient position: sitting Prep: DuraPrep Patient monitoring: heart rate, continuous pulse ox and blood pressure Approach: midline Location: L4-5 Injection technique: single-shot Needle Needle type: Pencan  Needle gauge: 24 G Needle length: 9 cm Needle insertion depth: 4 cm Assessment Sensory level: T10 Events: CSF return Additional Notes

## 2022-03-24 NOTE — Plan of Care (Signed)

## 2022-03-24 NOTE — Interval H&P Note (Signed)
History and Physical Interval Note:  03/24/2022 12:36 PM  Jessica Stevens  has presented today for surgery, with the diagnosis of left knee osteoarthritis.  The various methods of treatment have been discussed with the patient and family. After consideration of risks, benefits and other options for treatment, the patient has consented to  Procedure(s) with comments: TOTAL KNEE ARTHROPLASTY (Left) - spinal and general as a surgical intervention.  The patient's history has been reviewed, patient examined, no change in status, stable for surgery.  I have reviewed the patient's chart and labs.  Questions were answered to the patient's satisfaction.     [name]

## 2022-03-24 NOTE — Anesthesia Procedure Notes (Signed)
Anesthesia Regional Block: Adductor canal block   Pre-Anesthetic Checklist: , timeout performed,  Correct Patient, Correct Site, Correct Laterality,  Correct Procedure, Correct Position, site marked,  Risks and benefits discussed,  Pre-op evaluation,  At surgeon's request and post-op pain management  Laterality: Left  Prep: Maximum Sterile Barrier Precautions used, chloraprep       Needles:  Injection technique: Single-shot  Needle Type: Echogenic Stimulator Needle     Needle Length: 9cm  Needle Gauge: 21     Additional Needles:   Procedures:,,,, ultrasound used (permanent image in chart),,    Narrative:  Start time: 03/24/2022 12:03 PM End time: 03/24/2022 12:13 PM Injection made incrementally with aspirations every 5 mL.  Performed by: Personally  Anesthesiologist: Roderic Palau, MD

## 2022-03-24 NOTE — Anesthesia Preprocedure Evaluation (Addendum)
Anesthesia Evaluation  Patient identified by MRN, date of birth, ID band Patient awake    Reviewed: Allergy & Precautions, H&P , NPO status , Patient's Chart, lab work & pertinent test results  Airway Mallampati: II  TM Distance: >3 FB Neck ROM: Full    Dental no notable dental hx. (+) Partial Upper, Dental Advisory Given   Pulmonary asthma    Pulmonary exam normal breath sounds clear to auscultation       Cardiovascular hypertension, Pt. on medications  Rhythm:Regular Rate:Normal     Neuro/Psych  Headaches  Anxiety Depression       GI/Hepatic Neg liver ROS,GERD  ,,  Endo/Other  Hypothyroidism    Renal/GU negative Renal ROS  negative genitourinary   Musculoskeletal  (+) Arthritis , Osteoarthritis,    Abdominal   Peds  Hematology  (+) Blood dyscrasia, anemia   Anesthesia Other Findings   Reproductive/Obstetrics negative OB ROS                             Anesthesia Physical Anesthesia Plan  ASA: 2  Anesthesia Plan: Spinal   Post-op Pain Management: Regional block* and Tylenol PO (pre-op)*   Induction: Intravenous  PONV Risk Score and Plan: 3 and Ondansetron, Dexamethasone, Propofol infusion and Midazolam  Airway Management Planned: Natural Airway and Simple Face Mask  Additional Equipment:   Intra-op Plan:   Post-operative Plan:   Informed Consent: I have reviewed the patients History and Physical, chart, labs and discussed the procedure including the risks, benefits and alternatives for the proposed anesthesia with the patient or authorized representative who has indicated his/her understanding and acceptance.     Dental advisory given  Plan Discussed with: CRNA  Anesthesia Plan Comments:        Anesthesia Quick Evaluation

## 2022-03-24 NOTE — Care Plan (Signed)
Ortho Bundle Case Management Note  Patient Details  Name: KENNIDY LAMKE MRN: [id number] Date of Birth: 1965-07-26  L TKA on 03-24-22 DCP:  Home with son DME:  RW ordered through [facility] PT:  Ardeth Sportsman on 03-27-22                   DME Arranged:  Gilford Rile rolling DME Agency:  [employer]  HH Arranged:  NA HH Agency:  NA  Additional Comments: Please contact me with any questions of if this plan should need to change.  Marianne Sofia, RN,CCM [facility]  [phone] 03/24/2022, 10:34 AM

## 2022-03-24 NOTE — Transfer of Care (Signed)
Immediate Anesthesia Transfer of Care Note  Patient: Jessica Stevens  Procedure(s) Performed: TOTAL KNEE ARTHROPLASTY (Left: Knee)  Patient Location: PACU  Anesthesia Type:Spinal  Level of Consciousness: awake  Airway & Oxygen Therapy: Patient Spontanous Breathing and Patient connected to face mask oxygen  Post-op Assessment: Report given to RN and Post -op Vital signs reviewed and stable  Post vital signs: Reviewed and stable  Last Vitals:  Vitals Value Taken Time  BP 123/84 03/24/22 1600  Temp    Pulse 67 03/24/22 1603  Resp 16 03/24/22 1603  SpO2 100 % 03/24/22 1603  Vitals shown include unvalidated device data.  Last Pain:  Vitals:   03/24/22 1120  TempSrc:   PainSc: 4       Patients Stated Pain Goal: 4 (09/31/12 1624)  Complications: No notable events documented.

## 2022-03-24 NOTE — Progress Notes (Signed)
Orthopedic Tech Progress Note Patient Details:  Jessica Stevens 06-Sep-1965 [id number]  CPM Left Knee CPM Left Knee: On Left Knee Flexion (Degrees): 90 Left Knee Extension (Degrees): 0  Post Interventions Patient Tolerated: Well  Linus Salmons Daanya Lanphier 03/24/2022, 5:19 PM

## 2022-03-24 NOTE — OR Nursing (Signed)
In and out cath attempted without success

## 2022-03-24 NOTE — Brief Op Note (Signed)
03/24/2022  3:54 PM  PATIENT:  Jessica Stevens  57 y.o. female  PRE-OPERATIVE DIAGNOSIS:  left knee osteoarthritis, end stage  POST-OPERATIVE DIAGNOSIS:  left knee osteoarthritis, end stage  PROCEDURE:  Procedure(s) with comments: TOTAL KNEE ARTHROPLASTY (Left) - spinal and general DePuy Attune  SURGEON:  Surgeon(s) and Role:    Netta Cedars, MD - Primary  PHYSICIAN ASSISTANT:   ASSISTANTS: Ventura Bruns, PA-C   ANESTHESIA:   regional and spinal  EBL:  50 mL   BLOOD ADMINISTERED:none  DRAINS: none   LOCAL MEDICATIONS USED:  MARCAINE     SPECIMEN:  No Specimen  DISPOSITION OF SPECIMEN:  N/A  COUNTS:  YES  TOURNIQUET:   Total Tourniquet Time Documented: Thigh (Left) - 100 minutes Total: Thigh (Left) - 100 minutes   DICTATION: .Other Dictation: Dictation Number [id number]  PLAN OF CARE: Admit for overnight observation  PATIENT DISPOSITION:  PACU - hemodynamically stable.   Delay start of Pharmacological VTE agent (>24hrs) due to surgical blood loss or risk of bleeding: no

## 2022-03-24 NOTE — Op Note (Unsigned)
NAME: Jessica Stevens, Jessica Stevens MEDICAL RECORD NO: [id number] ACCOUNT NO: [id number] DATE OF BIRTH: May 27, 1965 FACILITY: [facility] LOCATION: [id number] PHYSICIAN: Doran Heater. Veverly Fells, MD  Operative Report   DATE OF PROCEDURE: 03/24/2022   PREOPERATIVE DIAGNOSIS:  Left knee end-stage arthritis.  POSTOPERATIVE DIAGNOSIS:  Left knee end-stage arthritis.  PROCEDURE PERFORMED:  Left total knee arthroplasty using DePuy Attune prosthesis.  ATTENDING SURGEON:  Doran Heater. Veverly Fells, MD  ASSISTANT:  Charletta Cousin [place], [place], who was scrubbed during the entire procedure, and necessary for satisfactory completion of surgery.  ANESTHESIA:  Spinal anesthesia plus adductor canal block was utilized.  ESTIMATED BLOOD LOSS:  Minimal.  FLUID REPLACEMENT:  1500 mL crystalloid.  COUNTS:  Instrument counts correct.  COMPLICATIONS:  No complications.  ANTIBIOTICS:  Perioperative antibiotics were given.  INDICATIONS:  The patient is a 57 year old female with worsening left knee pain secondary to end-stage arthritis, bone-on-bone.  The patient has had progressive varus deformity and worsening function and increasing pain with her knee due to arthritis.   The patient now has some evidence of tibial collapse on the medial side and given her failure of conservative treatment, she would like to move forward with surgical management with total knee arthroplasty in order to restore mechanical alignment and  eliminate pain and restore function.  Informed consent obtained.  DESCRIPTION OF PROCEDURE:  After an adequate level of anesthesia was achieved, the patient was positioned in the supine position.  The left leg was correctly identified and a nonsterile tourniquet placed on proximal thigh.  The left leg sterilely prepped  and draped in the usual manner.  Timeout called, verifying correct patient, correct site, we elevated the leg and exsanguinated with an Esmarch bandage.  We then inflated the tourniquet to 325 mmHg.  We placed the  knee in flexion and performed a  longitudinal midline incision with a 10 blade scalpel.  Dissection down through subcutaneous tissues.  We used a fresh 10 blade for the medial parapatellar arthrotomy.  We then divided lateral patellofemoral ligaments everting the patella.  The patient  had severe arthritis in all compartments with no cartilage remaining and large osteophytes.  We entered the distal femur with a step cut drill.  We then placed our intramedullary guide and resected 11 mm off the distal femur as the patient did have a  flexion contracture.  We then sized the femur to a size 5 anterior down performing anterior, posterior and chamfer cuts with a 4 in 1 block.  Next, we removed ACL and PCL meniscal tissue, subluxing the tibia anteriorly and gaining good exposure of this  tibia that had horrible wear in the medial compartment with bone loss and also some fragmentation of the medial most aspect of the medial tibial condyle.  We then used the external jig and resected right at the level of wear on the medial side with the  oscillating saw with minimal posterior slope for this posterior cruciate substituting prosthesis.  Once we had our tibial cut done, we used a lamina spreader, removed excess posterior femoral condyle osteophytes, which were large and multiple loose  bodies.  Once we had the back of the knee cleaned out, we injected the capsule with combination of Marcaine, Exparel and saline.  We checked our gaps, which were symmetric at least 10 mm.  Thus, we removed our tibial pins and completed our tibial  preparation with the 5 tibia.  We externally rotated the tibial component as much as possible to make sure we had good bone  support all around for the tibia and had the edge of the implant out along the cortex.  We used the modular drill and keel punch  to complete our tibial preparation.  We then went to the femur and did our box cut to the 5 left femur and then drilled our lug holes.  We  trialled with a 10 and then a 12 poly.  We then placed the knee in extension and resurfaced the patella going from  a 28 mm thickness down to about a 16 mm thickness.  We drilled lug holes for the patellar button, which was a 35.  We then ranged the knee and had excellent patellar tracking with no-touch technique.  We removed all trial components, irrigated  thoroughly, dried the bone, vacuum mixed the high viscosity cement on the back table and cemented the components into place all in one step with the tibia, then femur with a 12 mm poly spacer during the hardening process and held the knee in extension  for compression of the components.  We also used a clamp for the patella and held that tight until the cement was hardened.  Once all the cement was hardened, we removed excess cement with quarter-inch curved osteotome.  We inspected the posterior aspect  the need for additional cement.  We trialled with the 12 and went with a 14 real tibia poly and so once we selected the real size 14 tibial spacer, we placed that on the tibia and reduced the knee.  We had a nice stable knee throughout a full arc of  motion both in flexion and extension, our gaps were symmetric.  Patellar tracking was excellent.  We injected the anterior capsule with Marcaine, Exparel and saline and then closed with #1 Vicryl suture, followed by parapatellar arthrotomy followed by 0  in 2 layered subcutaneous closure and staples for skin.  Sterile dressing applied.  The patient was placed in a knee immobilizer, taken to recovery room.     SUJ D: 03/24/2022 4:02:55 pm T: 03/24/2022 10:17:00 pm  JOB: [id number]/ [id number]

## 2022-03-24 NOTE — Discharge Instructions (Signed)
Ice to the knee constantly.  Keep the incision covered and clean and dry for one week, then ok to get it wet in the shower.  Do exercise as instructed every hour, please to prevent stiffness.    DO NOT prop anything under the knee, it will make your knee stiff.  Prop under the ankle to encourage your knee to go straight.   Use the walker while you are up and around for balance.  Wear your support stockings 24/7 to prevent blood clots and take baby aspirin twice daily for 30 days also to prevent blood clots  Follow up with Dr Issac Moure in two weeks in the office, call [phone] for appt   INSTRUCTIONS AFTER JOINT REPLACEMENT   Remove items at home which could result in a fall. This includes throw rugs or furniture in walking pathways ICE to the affected joint every three hours while awake for 30 minutes at a time, for at least the first 3-5 days, and then as needed for pain and swelling.  Continue to use ice for pain and swelling. You may notice swelling that will progress down to the foot and ankle.  This is normal after surgery.  Elevate your leg when you are not up walking on it.   Continue to use the breathing machine you got in the hospital (incentive spirometer) which will help keep your temperature down.  It is common for your temperature to cycle up and down following surgery, especially at night when you are not up moving around and exerting yourself.  The breathing machine keeps your lungs expanded and your temperature down.   DIET:  As you were doing prior to hospitalization, we recommend a well-balanced diet.  DRESSING / WOUND CARE / SHOWERING  You may change your dressing 3-5 days after surgery.  Then change the dressing every day with sterile gauze.  Please use good hand washing techniques before changing the dressing.  Do not use any lotions or creams on the incision until instructed by your surgeon.  ACTIVITY  Increase activity slowly as tolerated, but follow the weight  bearing instructions below.   No driving for 6 weeks or until further direction given by your physician.  You cannot drive while taking narcotics.  No lifting or carrying greater than 10 lbs. until further directed by your surgeon. Avoid periods of inactivity such as sitting longer than an hour when not asleep. This helps prevent blood clots.  You may return to work once you are authorized by your doctor.     WEIGHT BEARING   Weight bearing as tolerated with assist device (walker, cane, etc) as directed, use it as long as suggested by your surgeon or therapist, typically at least 4-6 weeks.   EXERCISES  Results after joint replacement surgery are often greatly improved when you follow the exercise, range of motion and muscle strengthening exercises prescribed by your doctor. Safety measures are also important to protect the joint from further injury. Any time any of these exercises cause you to have increased pain or swelling, decrease what you are doing until you are comfortable again and then slowly increase them. If you have problems or questions, call your caregiver or physical therapist for advice.   Rehabilitation is important following a joint replacement. After just a few days of immobilization, the muscles of the leg can become weakened and shrink (atrophy).  These exercises are designed to build up the tone and strength of the thigh and leg muscles and to   improve motion. Often times heat used for twenty to thirty minutes before working out will loosen up your tissues and help with improving the range of motion but do not use heat for the first two weeks following surgery (sometimes heat can increase post-operative swelling).   These exercises can be done on a training (exercise) mat, on the floor, on a table or on a bed. Use whatever works the best and is most comfortable for you.    Use music or television while you are exercising so that the exercises are a pleasant break in your day.  This will make your life better with the exercises acting as a break in your routine that you can look forward to.   Perform all exercises about fifteen times, three times per day or as directed.  You should exercise both the operative leg and the other leg as well.  Exercises include:   Quad Sets - Tighten up the muscle on the front of the thigh (Quad) and hold for 5-10 seconds.   Straight Leg Raises - With your knee straight (if you were given a brace, keep it on), lift the leg to 60 degrees, hold for 3 seconds, and slowly lower the leg.  Perform this exercise against resistance later as your leg gets stronger.  Leg Slides: Lying on your back, slowly slide your foot toward your buttocks, bending your knee up off the floor (only go as far as is comfortable). Then slowly slide your foot back down until your leg is flat on the floor again.  Angel Wings: Lying on your back spread your legs to the side as far apart as you can without causing discomfort.  Hamstring Strength:  Lying on your back, push your heel against the floor with your leg straight by tightening up the muscles of your buttocks.  Repeat, but this time bend your knee to a comfortable angle, and push your heel against the floor.  You may put a pillow under the heel to make it more comfortable if necessary.   A rehabilitation program following joint replacement surgery can speed recovery and prevent re-injury in the future due to weakened muscles. Contact your doctor or a physical therapist for more information on knee rehabilitation.    CONSTIPATION  Constipation is defined medically as fewer than three stools per week and severe constipation as less than one stool per week.  Even if you have a regular bowel pattern at home, your normal regimen is likely to be disrupted due to multiple reasons following surgery.  Combination of anesthesia, postoperative narcotics, change in appetite and fluid intake all can affect your bowels.   YOU MUST  use at least one of the following options; they are listed in order of increasing strength to get the job done.  They are all available over the counter, and you may need to use some, POSSIBLY even all of these options:    Drink plenty of fluids (prune juice may be helpful) and high fiber foods Colace 100 mg by mouth twice a day  Senokot for constipation as directed and as needed Dulcolax (bisacodyl), take with full glass of water  Miralax (polyethylene glycol) once or twice a day as needed.  If you have tried all these things and are unable to have a bowel movement in the first 3-4 days after surgery call either your surgeon or your primary doctor.    If you experience loose stools or diarrhea, hold the medications until you stool forms back   up.  If your symptoms do not get better within 1 week or if they get worse, check with your doctor.  If you experience "the worst abdominal pain ever" or develop nausea or vomiting, please contact the office immediately for further recommendations for treatment.   ITCHING:  If you experience itching with your medications, try taking only a single pain pill, or even half a pain pill at a time.  You can also use Benadryl over the counter for itching or also to help with sleep.   TED HOSE STOCKINGS:  Use stockings on both legs until for at least 2 weeks or as directed by physician office. They may be removed at night for sleeping.  MEDICATIONS:  See your medication summary on the "After Visit Summary" that nursing will review with you.  You may have some home medications which will be placed on hold until you complete the course of blood thinner medication.  It is important for you to complete the blood thinner medication as prescribed.  PRECAUTIONS:  If you experience chest pain or shortness of breath - call 911 immediately for transfer to the hospital emergency department.   If you develop a fever greater that 101 F, purulent drainage from wound, increased  redness or drainage from wound, foul odor from the wound/dressing, or calf pain - CONTACT YOUR SURGEON.                                                   FOLLOW-UP APPOINTMENTS:  If you do not already have a post-op appointment, please call the office for an appointment to be seen by your surgeon.  Guidelines for how soon to be seen are listed in your "After Visit Summary", but are typically between 1-4 weeks after surgery.  OTHER INSTRUCTIONS:   Knee Replacement:  Do not place pillow under knee, focus on keeping the knee straight while resting. CPM instructions: 0-90 degrees, 2 hours in the morning, 2 hours in the afternoon, and 2 hours in the evening. Place foam block, curve side up under heel at all times except when in CPM or when walking.  DO NOT modify, tear, cut, or change the foam block in any way.  POST-OPERATIVE OPIOID TAPER INSTRUCTIONS: It is important to wean off of your opioid medication as soon as possible. If you do not need pain medication after your surgery it is ok to stop day one. Opioids include: Codeine, Hydrocodone(Norco, Vicodin), Oxycodone(Percocet, oxycontin) and hydromorphone amongst others.  Long term and even short term use of opiods can cause: Increased pain response Dependence Constipation Depression Respiratory depression And more.  Withdrawal symptoms can include Flu like symptoms Nausea, vomiting And more Techniques to manage these symptoms Hydrate well Eat regular healthy meals Stay active Use relaxation techniques(deep breathing, meditating, yoga) Do Not substitute Alcohol to help with tapering If you have been on opioids for less than two weeks and do not have pain than it is ok to stop all together.  Plan to wean off of opioids This plan should start within one week post op of your joint replacement. Maintain the same interval or time between taking each dose and first decrease the dose.  Cut the total daily intake of opioids by one tablet each  day Next start to increase the time between doses. The last dose that should be eliminated is   the evening dose.   MAKE SURE YOU:  Understand these instructions.  Get help right away if you are not doing well or get worse.    Thank you for letting us be a part of your medical care team.  It is a privilege we respect greatly.  We hope these instructions will help you stay on track for a fast and full recovery!      

## 2022-03-24 NOTE — Anesthesia Postprocedure Evaluation (Signed)
Anesthesia Post Note  Patient: VENTURA LEGGITT  Procedure(s) Performed: TOTAL KNEE ARTHROPLASTY (Left: Knee)     Patient location during evaluation: PACU Anesthesia Type: Spinal and Regional Level of consciousness: oriented and awake and alert Pain management: pain level controlled Vital Signs Assessment: post-procedure vital signs reviewed and stable Respiratory status: spontaneous breathing and respiratory function stable Cardiovascular status: blood pressure returned to baseline and stable Postop Assessment: no headache, no backache, no apparent nausea or vomiting, spinal receding and patient able to bend at knees Anesthetic complications: no  No notable events documented.  Last Vitals:  Vitals:   03/24/22 1722 03/24/22 1730  BP: 133/81 132/77  Pulse: 76 63  Resp: 15 15  Temp:    SpO2: 100% 97%    Last Pain:  Vitals:   03/24/22 1700  TempSrc:   PainSc: 0-No pain                 Adama Ferber,W. EDMOND

## 2022-03-24 NOTE — OR Nursing (Signed)
Ortho tech at bedside for CPM set up.

## 2022-03-25 DIAGNOSIS — E039 Hypothyroidism, unspecified: Secondary | ICD-10-CM | POA: Diagnosis not present

## 2022-03-25 DIAGNOSIS — Z79899 Other long term (current) drug therapy: Secondary | ICD-10-CM | POA: Diagnosis not present

## 2022-03-25 DIAGNOSIS — I10 Essential (primary) hypertension: Secondary | ICD-10-CM | POA: Diagnosis not present

## 2022-03-25 DIAGNOSIS — Z96611 Presence of right artificial shoulder joint: Secondary | ICD-10-CM | POA: Diagnosis not present

## 2022-03-25 DIAGNOSIS — M1712 Unilateral primary osteoarthritis, left knee: Secondary | ICD-10-CM | POA: Diagnosis not present

## 2022-03-25 DIAGNOSIS — Z96652 Presence of left artificial knee joint: Secondary | ICD-10-CM | POA: Diagnosis not present

## 2022-03-25 DIAGNOSIS — J45909 Unspecified asthma, uncomplicated: Secondary | ICD-10-CM | POA: Diagnosis not present

## 2022-03-25 LAB — CBC
HCT: 31.9 % — ABNORMAL LOW (ref 36.0–46.0)
Hemoglobin: 10.3 g/dL — ABNORMAL LOW (ref 12.0–15.0)
MCH: 29.8 pg (ref 26.0–34.0)
MCHC: 32.3 g/dL (ref 30.0–36.0)
MCV: 92.2 fL (ref 80.0–100.0)
Platelets: 272 10*3/uL (ref 150–400)
RBC: 3.46 MIL/uL — ABNORMAL LOW (ref 3.87–5.11)
RDW: 14.8 % (ref 11.5–15.5)
WBC: 12.3 10*3/uL — ABNORMAL HIGH (ref 4.0–10.5)
nRBC: 0 % (ref 0.0–0.2)

## 2022-03-25 LAB — BASIC METABOLIC PANEL
Anion gap: 8 (ref 5–15)
BUN: 13 mg/dL (ref 6–20)
CO2: 25 mmol/L (ref 22–32)
Calcium: 9.2 mg/dL (ref 8.9–10.3)
Chloride: 104 mmol/L (ref 98–111)
Creatinine, Ser: 0.89 mg/dL (ref 0.44–1.00)
GFR, Estimated: >60 mL/min (ref >60)
Glucose, Bld: 114 mg/dL — ABNORMAL HIGH (ref 70–99)
Potassium: 4.2 mmol/L (ref 3.5–5.1)
Sodium: 137 mmol/L (ref 135–145)

## 2022-03-25 MED ORDER — SODIUM CHLORIDE 0.9 % IV BOLUS
250.0000 mL | Freq: Once | INTRAVENOUS | Status: AC
  Administered 2022-03-25: 250 mL via INTRAVENOUS

## 2022-03-25 NOTE — Discharge Summary (Signed)
In most cases prophylactic antibiotics for Dental procdeures after total joint surgery are not necessary.  Exceptions are as follows:  1. History of prior total joint infection  2. Severely immunocompromised (Organ Transplant, cancer chemotherapy, Rheumatoid biologic meds such as [place])  3. Poorly controlled diabetes (A1C &gt; 8.0, blood glucose over 200)  If you have one of these conditions, contact your surgeon for an antibiotic prescription, prior to your dental procedure. Orthopedic Discharge Summary        Physician Discharge Summary  Patient ID: Jessica Stevens MRN: [id number] DOB/AGE: 1965/05/27 57 y.o.  Admit date: 03/24/2022 Discharge date: 03/25/2022   Procedures:  Procedure(s) (LRB): TOTAL KNEE ARTHROPLASTY (Left)  Attending Physician:  Dr. Esmond Plants  Admission Diagnoses:   left knee end stage OA  Discharge Diagnoses:  same   Past Medical History:  Diagnosis Date   Allergy    Anemia    Ankle pain, left    Anxiety    Arthritis of knee, degenerative    Asthma    Carpal tunnel syndrome    Cervical radiculopathy    Chronic back pain    Complete rupture of rotator cuff    Complete rupture of rotator cuff    Depression    GERD (gastroesophageal reflux disease)    Gynecomastia    HNP (herniated nucleus pulposus), cervical    HNP (herniated nucleus pulposus), cervical    Hyperlipidemia    Hypertension    Hypothyroidism    Knee pain    OA (osteoarthritis) of knee    Obesity    Sleep apnea    hx of    Urinary incontinence     PCP: Celene Squibb, MD   Discharged Condition: good  Hospital Course:  Patient underwent the above stated procedure on 03/24/2022. Patient tolerated the procedure well and brought to the recovery room in good condition and subsequently to the floor. Patient had an uncomplicated hospital course and was stable for discharge.   Disposition: Discharge disposition: 01-Home or Self Care      with follow up in 2  weeks    Follow-up Information     Netta Cedars, MD. Go on 04/04/2022.   Specialty: Orthopedic Surgery Why: You are scheduled for a follow up appointment on 04-04-22 at 10:45 am. Contact information: [address] [place] [address] [phone]         Netta Cedars, MD. Call in 2 week(s).   Specialty: Orthopedic Surgery Why: call [phone] for appt Contact information: [address] [place] [address] [phone]                 Dental Antibiotics:  In most cases prophylactic antibiotics for Dental procdeures after total joint surgery are not necessary.  Exceptions are as follows:  1. History of prior total joint infection  2. Severely immunocompromised (Organ Transplant, cancer chemotherapy, Rheumatoid biologic meds such as [place])  3. Poorly controlled diabetes (A1C &gt; 8.0, blood glucose over 200)  If you have one of these conditions, contact your surgeon for an antibiotic prescription, prior to your dental procedure.  Discharge Instructions     Call MD / Call 911   Complete by: As directed    If you experience chest pain or shortness of breath, CALL 911 and be transported to the hospital emergency room.  If you develope a fever above 101 F, pus (white drainage) or increased drainage or redness at the wound, or calf pain, call your  surgeon's office.   Constipation Prevention   Complete by: As directed    Drink plenty of fluids.  Prune juice may be helpful.  You may use a stool softener, such as Colace (over the counter) 100 mg twice a day.  Use MiraLax (over the counter) for constipation as needed.   Diet - low sodium heart healthy   Complete by: As directed    Increase activity slowly as tolerated   Complete by: As directed    Post-operative opioid taper instructions:   Complete by: As directed    POST-OPERATIVE OPIOID TAPER INSTRUCTIONS: It is important to wean off of your opioid medication as soon as  possible. If you do not need pain medication after your surgery it is ok to stop day one. Opioids include: Codeine, Hydrocodone(Norco, Vicodin), Oxycodone(Percocet, oxycontin) and hydromorphone amongst others.  Long term and even short term use of opiods can cause: Increased pain response Dependence Constipation Depression Respiratory depression And more.  Withdrawal symptoms can include Flu like symptoms Nausea, vomiting And more Techniques to manage these symptoms Hydrate well Eat regular healthy meals Stay active Use relaxation techniques(deep breathing, meditating, yoga) Do Not substitute Alcohol to help with tapering If you have been on opioids for less than two weeks and do not have pain than it is ok to stop all together.  Plan to wean off of opioids This plan should start within one week post op of your joint replacement. Maintain the same interval or time between taking each dose and first decrease the dose.  Cut the total daily intake of opioids by one tablet each day Next start to increase the time between doses. The last dose that should be eliminated is the evening dose.          Allergies as of 03/25/2022       Reactions   Penicillins Other (See Comments)   According to Allergy Test.   Oxycodone-acetaminophen Rash   Not allergic to tylenol        Medication List     TAKE these medications    AIRBORNE PO Take 1 tablet by mouth in the morning and at bedtime.   Hair Skin & Nails Tabs Take 1 tablet by mouth daily.   albuterol 108 (90 Base) MCG/ACT inhaler Commonly known as: VENTOLIN HFA Inhale 2 puffs into the lungs 2 (two) times daily as needed for wheezing or shortness of breath.   amLODipine 10 MG tablet Commonly known as: NORVASC Take 10 mg by mouth daily.   Anoro Ellipta 62.5-25 MCG/ACT Aepb Generic drug: umeclidinium-vilanterol Inhale 1 puff into the lungs daily.   aspirin 81 MG chewable tablet Commonly known as: Aspirin  Childrens Chew 1 tablet (81 mg total) by mouth 2 (two) times daily.   buPROPion 150 MG 24 hr tablet Commonly known as: WELLBUTRIN XL Take 150 mg by mouth in the morning and at bedtime.   busPIRone 5 MG tablet Commonly known as: BUSPAR Take 5 mg by mouth 2 (two) times daily as needed (stress).   cyanocobalamin 1000 MCG/ML injection Commonly known as: VITAMIN B12 Inject 1,000 mcg into the muscle every 30 (thirty) days.   DULoxetine 60 MG capsule Commonly known as: CYMBALTA Take 60 mg by mouth 2 (two) times daily.   fluticasone 50 MCG/ACT nasal spray Commonly known as: FLONASE Place 1 spray into both nostrils daily for 10 days.   furosemide 20 MG tablet Commonly known as: LASIX Take 20 mg by mouth every morning.   gabapentin 100  MG capsule Commonly known as: NEURONTIN Take 2 capsules (200 mg total) by mouth every 12 (twelve) hours.   hydrALAZINE 25 MG tablet Commonly known as: APRESOLINE Take 25 mg by mouth 2 (two) times daily.   HYDROcodone-acetaminophen 10-325 MG tablet Commonly known as: NORCO Take 1 tablet by mouth 3 (three) times daily as needed for moderate pain.   HYDROmorphone 2 MG tablet Commonly known as: Dilaudid Take 1 tablet (2 mg total) by mouth every 4 (four) hours as needed for severe pain (for severe pain only and do not take with Hydrocodone).   levocetirizine 5 MG tablet Commonly known as: XYZAL Take 5 mg by mouth every evening.   levothyroxine 112 MCG tablet Commonly known as: SYNTHROID Take 112 mcg by mouth daily before breakfast.   losartan 100 MG tablet Commonly known as: COZAAR Take 100 mg by mouth daily.   meloxicam 15 MG tablet Commonly known as: MOBIC Take 15 mg by mouth daily.   montelukast 10 MG tablet Commonly known as: SINGULAIR Take 10 mg by mouth at bedtime as needed (allergies).   ondansetron 4 MG disintegrating tablet Commonly known as: ZOFRAN-ODT Take 1 tablet (4 mg total) by mouth every 6 (six) hours as needed for  nausea or vomiting.   ondansetron 4 MG tablet Commonly known as: Zofran Take 1 tablet (4 mg total) by mouth every 8 (eight) hours as needed for nausea, vomiting or refractory nausea / vomiting.   pantoprazole 40 MG tablet Commonly known as: PROTONIX Take 1 tablet (40 mg total) by mouth daily.   potassium chloride 10 MEQ CR capsule Commonly known as: MICRO-K Take 10 mEq by mouth daily.   rosuvastatin 20 MG tablet Commonly known as: CRESTOR Take 20 mg by mouth daily.   tiZANidine 4 MG tablet Commonly known as: ZANAFLEX Take 1 tablet (4 mg total) by mouth every 8 (eight) hours as needed for muscle spasms. What changed:  medication strength how much to take when to take this reasons to take this   traMADol 50 MG tablet Commonly known as: ULTRAM Take 1 tablet (50 mg total) by mouth every 6 (six) hours as needed (pain).   Voltaren 1 % Gel Generic drug: diclofenac Sodium Apply 2 g topically daily as needed (pain).          Signed: [name] 03/25/2022, 9:18 AM  [facility] is now [facility] [address], [place], [place], [place] [address] Phone: [place]

## 2022-03-25 NOTE — Progress Notes (Signed)
Orthopedics Progress Note  Subjective: Patient with some pain today but controlled.  Objective:  Vitals:   03/25/22 0616 03/25/22 0837  BP: (!) 157/86   Pulse: 68   Resp: 16   Temp: 98.4 F (36.9 C)   SpO2: 99% 98%    General: Awake and alert  Musculoskeletal: Left knee dressing changed. Incision CDI. Minimal swelling. Easily able to actively range from 0-90 Neurovascularly intact  Lab Results  Component Value Date   WBC 12.3 (H) 03/25/2022   HGB 10.3 (L) 03/25/2022   HCT 31.9 (L) 03/25/2022   MCV 92.2 03/25/2022   PLT 272 03/25/2022       Component Value Date/Time   NA 137 03/25/2022 0408   K 4.2 03/25/2022 0408   CL 104 03/25/2022 0408   CO2 25 03/25/2022 0408   GLUCOSE 114 (H) 03/25/2022 0408   BUN 13 03/25/2022 0408   CREATININE 0.89 03/25/2022 0408   CALCIUM 9.2 03/25/2022 0408   GFRNONAA >60 03/25/2022 0408   GFRAA >60 09/14/2019 1050    No results found for: "INR", "PROTIME"  Assessment/Plan: POD #1 s/p Procedure(s): TOTAL KNEE ARTHROPLASTY Up with therapy. DVT prophylaxis with TED/SCDs and aspirin Home with Son/RW, outpatient therapy with [facility] in [place] on Monday Possible discharge later today  Remo Lipps R. Veverly Fells, MD 03/25/2022 9:15 AM

## 2022-03-25 NOTE — Progress Notes (Signed)
Patient discharged to home w/ family. Given all belongings, instructions, rolling walker. Verbalized understanding of all instructions. Escorted to pov via w/c.

## 2022-03-25 NOTE — Evaluation (Signed)
Physical Therapy One Time Evaluation Patient Details Name: Jessica Stevens MRN: [id number] DOB: Jan 04, 1966 Today's Date: 03/25/2022  History of Present Illness  Pt is a 57 year old female s/p Left TKA on 03/24/22.  Clinical Impression  Patient evaluated by Physical Therapy with no further acute PT needs identified. All education has been completed and the patient has no further questions.  Pt ambulated good distance in hallway, practiced safe stair technique, and performed LE exercises.  Pt provided with HEP handout.  Pt had no further questions and feels ready for d/c home today.  PT is signing off. Thank you for this referral.        Recommendations for follow up therapy are one component of a multi-disciplinary discharge planning process, led by the attending physician.  Recommendations may be updated based on patient status, additional functional criteria and insurance authorization.  Follow Up Recommendations Follow physician's recommendations for discharge plan and follow up therapies      Assistance Recommended at Discharge    Patient can return home with the following       Equipment Recommendations Rolling walker (2 wheels)  Recommendations for Other Services       Functional Status Assessment Patient has had a recent decline in their functional status and demonstrates the ability to make significant improvements in function in a reasonable and predictable amount of time.     Precautions / Restrictions Precautions Precautions: Fall;Knee Restrictions Weight Bearing Restrictions: No LLE Weight Bearing: Weight bearing as tolerated      Mobility  Bed Mobility Overal bed mobility: Needs Assistance Bed Mobility: Supine to Sit     Supine to sit: Supervision          Transfers Overall transfer level: Needs assistance Equipment used: Rolling walker (2 wheels) Transfers: Sit to/from Stand Sit to Stand: Min guard, Supervision           General transfer  comment: verbal cues for UE and LE positioning    Ambulation/Gait Ambulation/Gait assistance: Min guard, Supervision Gait Distance (Feet): 300 Feet Assistive device: Rolling walker (2 wheels) Gait Pattern/deviations: Step-to pattern, Step-through pattern, Decreased stride length       General Gait Details: initial cues for sequence and step length however pt progressed to step through  Stairs Stairs: Yes Stairs assistance: Min guard Stair Management: Step to pattern, Forwards, With walker, One rail Right Number of Stairs: 3 General stair comments: verbal cues for sequence, RW positioning and safety; initially performed with only right handrail however also educated on backwards technique with RW in case pt requires bil UE support  Wheelchair Mobility    Modified Rankin (Stroke Patients Only)       Balance                                             Pertinent Vitals/Pain Pain Assessment Pain Assessment: 0-10 Pain Score: 5  Pain Location: none at rest, left knee Pain Descriptors / Indicators: Burning Pain Intervention(s): Monitored during session, Repositioned    Home Living Family/patient expects to be discharged to:: Private residence Living Arrangements: Alone Available Help at Discharge: Family Type of Home: House Home Access: Stairs to enter Entrance Stairs-Rails: Right Entrance Stairs-Number of Steps: 2-3   Home Layout: One level Home Equipment: None      Prior Function Prior Level of Function : Independent/Modified Independent  Hand Dominance        Extremity/Trunk Assessment        Lower Extremity Assessment Lower Extremity Assessment: LLE deficits/detail LLE Deficits / Details: able to perform SLR, AAROM left knee approx 5-55*       Communication   Communication: No difficulties  Cognition Arousal/Alertness: Awake/alert Behavior During Therapy: WFL for tasks assessed/performed Overall  Cognitive Status: Within Functional Limits for tasks assessed                                          General Comments      Exercises Total Joint Exercises Ankle Circles/Pumps: AROM, Both, 10 reps Quad Sets: AROM, Both, 10 reps Heel Slides: AAROM, Left, 10 reps Hip ABduction/ADduction: AAROM, Left, 10 reps Straight Leg Raises: AAROM, Left, 10 reps   Assessment/Plan    PT Assessment All further PT needs can be met in the next venue of care  PT Problem List Decreased strength;Decreased range of motion;Decreased mobility;Decreased knowledge of use of DME;Pain       PT Treatment Interventions      PT Goals (Current goals can be found in the Care Plan section)  Acute Rehab PT Goals PT Goal Formulation: All assessment and education complete, DC therapy    Frequency       Co-evaluation               AM-PAC PT "6 Clicks" Mobility  Outcome Measure Help needed turning from your back to your side while in a flat bed without using bedrails?: A Little Help needed moving from lying on your back to sitting on the side of a flat bed without using bedrails?: A Little Help needed moving to and from a bed to a chair (including a wheelchair)?: A Little Help needed standing up from a chair using your arms (e.g., wheelchair or bedside chair)?: A Little Help needed to walk in hospital room?: A Little Help needed climbing 3-5 steps with a railing? : A Little 6 Click Score: 18    End of Session Equipment Utilized During Treatment: Gait belt Activity Tolerance: Patient tolerated treatment well Patient left: in chair;with call bell/phone within reach;with chair alarm set Nurse Communication: Mobility status PT Visit Diagnosis: Difficulty in walking, not elsewhere classified (R26.2)    Time: 1024-1040 PT Time Calculation (min) (ACUTE ONLY): 16 min   Charges:   PT Evaluation $PT Eval Low Complexity: 1 Low         Kati PT, DPT Physical Therapist Acute  Rehabilitation Services Preferred contact method: Secure Chat Weekend Pager Only: [phone] Office: [place] 03/25/2022, 11:46 AM

## 2022-03-27 ENCOUNTER — Encounter (HOSPITAL_COMMUNITY): Payer: Self-pay | Admitting: Orthopedic Surgery

## 2022-03-27 DIAGNOSIS — M25562 Pain in left knee: Secondary | ICD-10-CM | POA: Diagnosis not present

## 2022-03-27 DIAGNOSIS — R269 Unspecified abnormalities of gait and mobility: Secondary | ICD-10-CM | POA: Diagnosis not present

## 2022-03-27 DIAGNOSIS — M25662 Stiffness of left knee, not elsewhere classified: Secondary | ICD-10-CM | POA: Diagnosis not present

## 2022-03-29 DIAGNOSIS — M25662 Stiffness of left knee, not elsewhere classified: Secondary | ICD-10-CM | POA: Diagnosis not present

## 2022-03-29 DIAGNOSIS — R269 Unspecified abnormalities of gait and mobility: Secondary | ICD-10-CM | POA: Diagnosis not present

## 2022-03-29 DIAGNOSIS — M25562 Pain in left knee: Secondary | ICD-10-CM | POA: Diagnosis not present

## 2022-04-04 DIAGNOSIS — Z4789 Encounter for other orthopedic aftercare: Secondary | ICD-10-CM | POA: Diagnosis not present

## 2022-04-07 DIAGNOSIS — M25662 Stiffness of left knee, not elsewhere classified: Secondary | ICD-10-CM | POA: Diagnosis not present

## 2022-04-07 DIAGNOSIS — M25562 Pain in left knee: Secondary | ICD-10-CM | POA: Diagnosis not present

## 2022-04-07 DIAGNOSIS — R269 Unspecified abnormalities of gait and mobility: Secondary | ICD-10-CM | POA: Diagnosis not present

## 2022-04-12 IMAGING — DX DG KNEE COMPLETE 4+V*L*
4 series · 4 of 4 positions shown · non-contrast
Comparison: Knee radiographs dated 10/23/2012.

CLINICAL DATA: Knee pain after falling today and yesterday.

EXAM:
LEFT KNEE - COMPLETE 4+ VIEW

[knee ap]
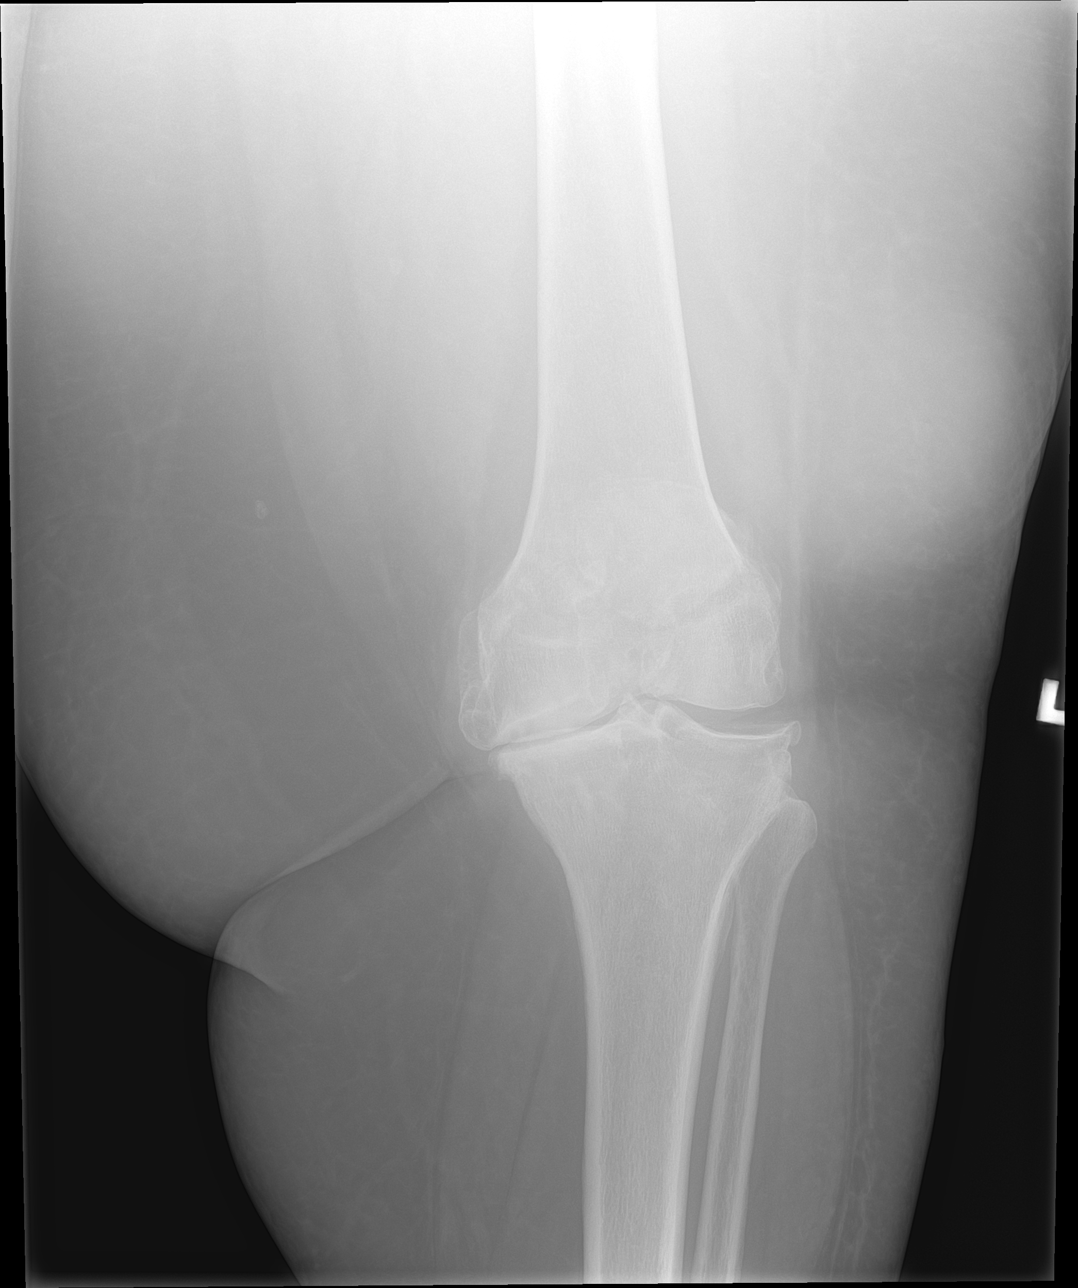

[knee lat]
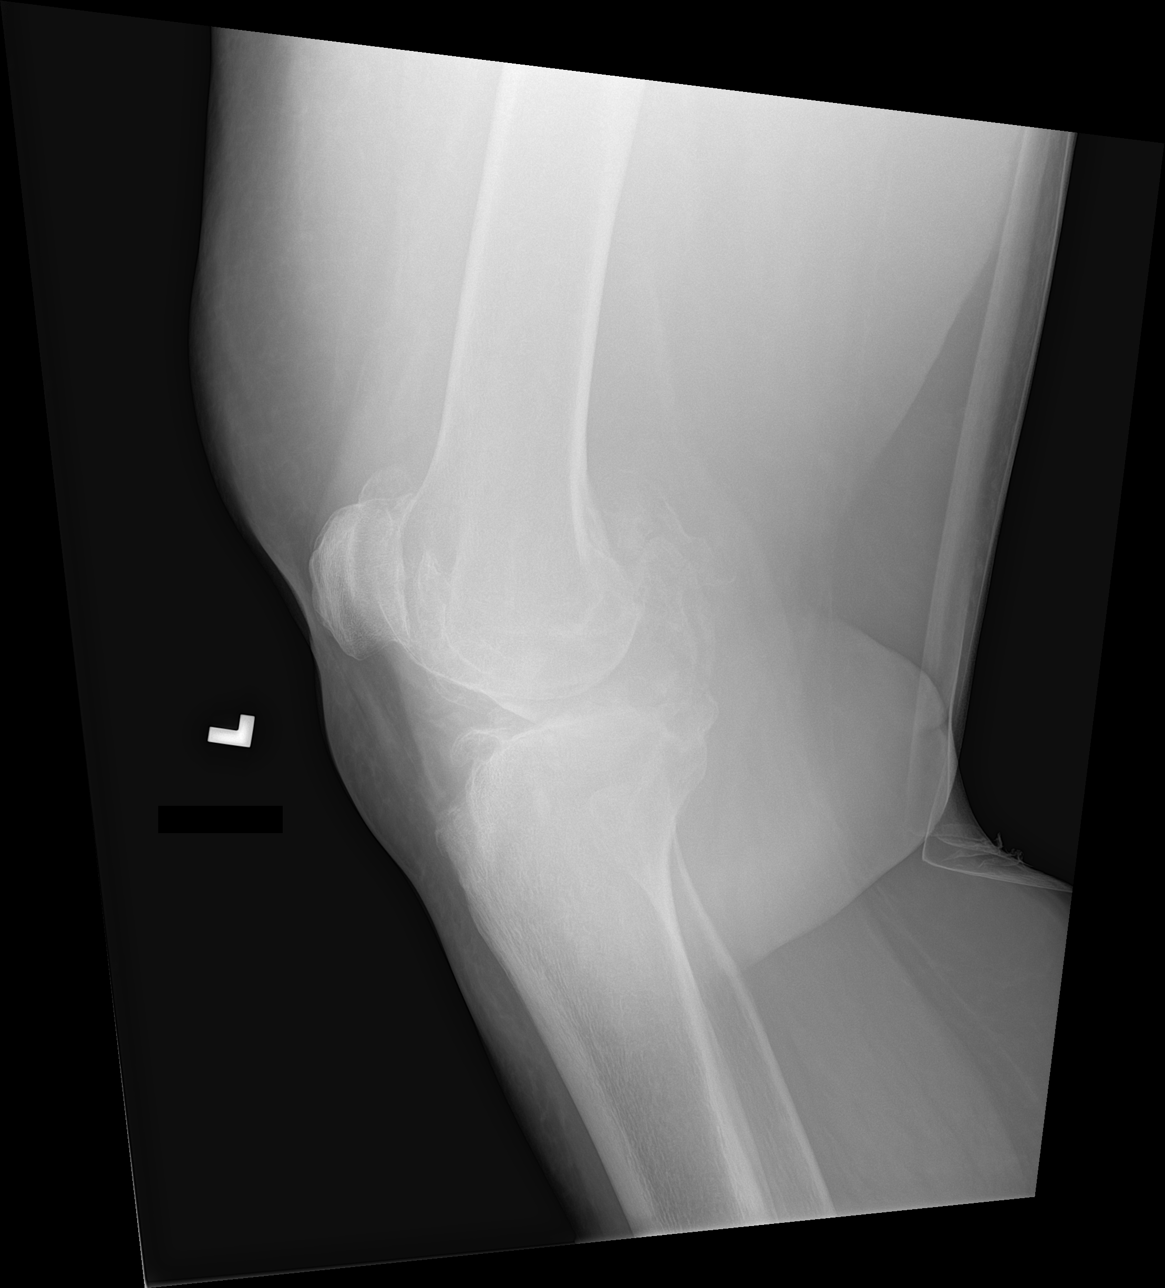

[knee obl (1 of 2)]
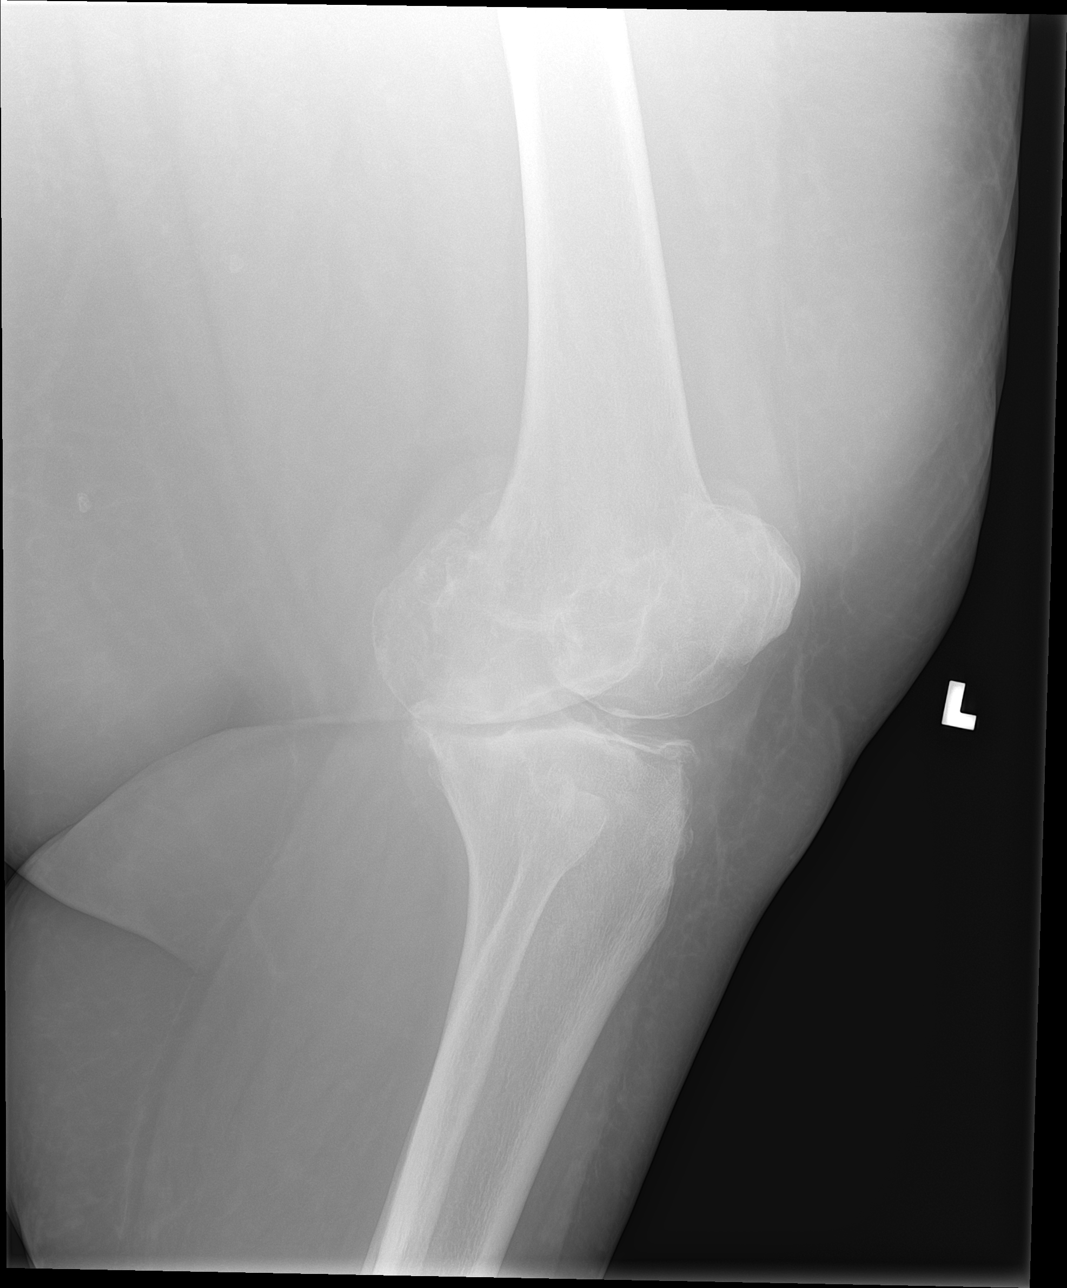

[knee obl (2 of 2)]
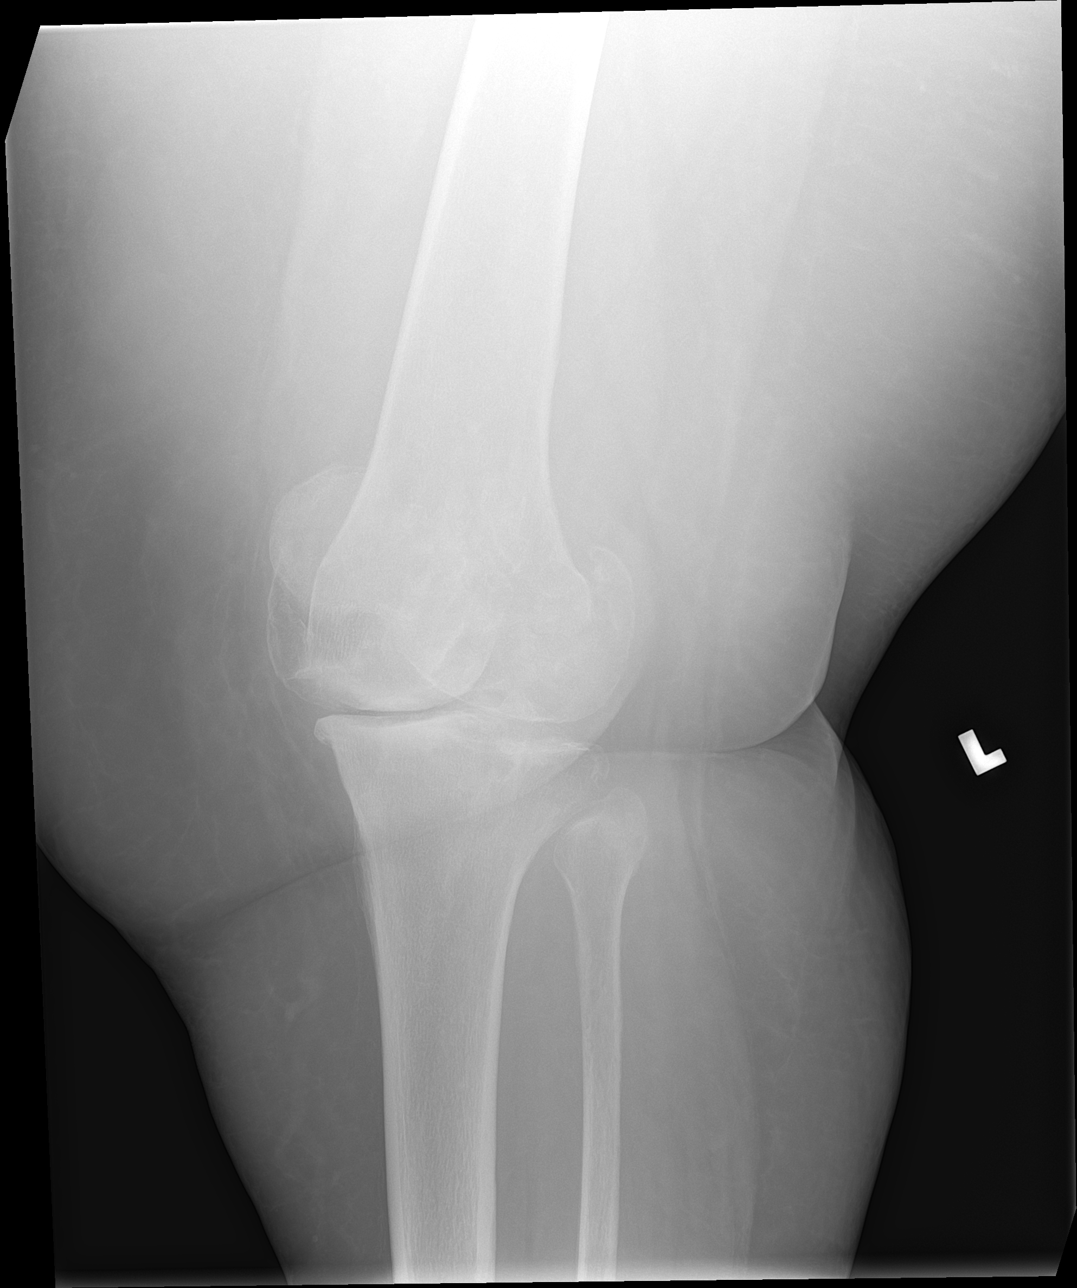

[4 of 4 positions shown; findings below may reference images not displayed]

FINDINGS: No evidence of fracture, dislocation, or joint effusion. Moderate to
severe tricompartmental osteoarthritis is noted.
IMPRESSION: No acute osseous injury.

## 2022-04-14 DIAGNOSIS — M25662 Stiffness of left knee, not elsewhere classified: Secondary | ICD-10-CM | POA: Diagnosis not present

## 2022-04-14 DIAGNOSIS — R269 Unspecified abnormalities of gait and mobility: Secondary | ICD-10-CM | POA: Diagnosis not present

## 2022-04-14 DIAGNOSIS — M25562 Pain in left knee: Secondary | ICD-10-CM | POA: Diagnosis not present

## 2022-04-21 DIAGNOSIS — M25562 Pain in left knee: Secondary | ICD-10-CM | POA: Diagnosis not present

## 2022-04-21 DIAGNOSIS — R269 Unspecified abnormalities of gait and mobility: Secondary | ICD-10-CM | POA: Diagnosis not present

## 2022-04-21 DIAGNOSIS — M25662 Stiffness of left knee, not elsewhere classified: Secondary | ICD-10-CM | POA: Diagnosis not present

## 2022-04-26 DIAGNOSIS — M25562 Pain in left knee: Secondary | ICD-10-CM | POA: Diagnosis not present

## 2022-04-26 DIAGNOSIS — R269 Unspecified abnormalities of gait and mobility: Secondary | ICD-10-CM | POA: Diagnosis not present

## 2022-04-26 DIAGNOSIS — M25662 Stiffness of left knee, not elsewhere classified: Secondary | ICD-10-CM | POA: Diagnosis not present

## 2022-04-27 ENCOUNTER — Other Ambulatory Visit (HOSPITAL_COMMUNITY): Payer: Self-pay | Admitting: Family Medicine

## 2022-04-27 DIAGNOSIS — M25552 Pain in left hip: Secondary | ICD-10-CM | POA: Diagnosis not present

## 2022-04-27 DIAGNOSIS — E538 Deficiency of other specified B group vitamins: Secondary | ICD-10-CM | POA: Diagnosis not present

## 2022-05-03 DIAGNOSIS — M25662 Stiffness of left knee, not elsewhere classified: Secondary | ICD-10-CM | POA: Diagnosis not present

## 2022-05-03 DIAGNOSIS — M25562 Pain in left knee: Secondary | ICD-10-CM | POA: Diagnosis not present

## 2022-05-03 DIAGNOSIS — R269 Unspecified abnormalities of gait and mobility: Secondary | ICD-10-CM | POA: Diagnosis not present

## 2022-05-05 DIAGNOSIS — M25562 Pain in left knee: Secondary | ICD-10-CM | POA: Diagnosis not present

## 2022-05-05 DIAGNOSIS — M25662 Stiffness of left knee, not elsewhere classified: Secondary | ICD-10-CM | POA: Diagnosis not present

## 2022-05-05 DIAGNOSIS — R269 Unspecified abnormalities of gait and mobility: Secondary | ICD-10-CM | POA: Diagnosis not present

## 2022-05-08 DIAGNOSIS — M25512 Pain in left shoulder: Secondary | ICD-10-CM | POA: Diagnosis not present

## 2022-05-09 DIAGNOSIS — R269 Unspecified abnormalities of gait and mobility: Secondary | ICD-10-CM | POA: Diagnosis not present

## 2022-05-09 DIAGNOSIS — M25662 Stiffness of left knee, not elsewhere classified: Secondary | ICD-10-CM | POA: Diagnosis not present

## 2022-05-09 DIAGNOSIS — M25562 Pain in left knee: Secondary | ICD-10-CM | POA: Diagnosis not present

## 2022-05-15 DIAGNOSIS — E538 Deficiency of other specified B group vitamins: Secondary | ICD-10-CM | POA: Diagnosis not present

## 2022-06-20 DIAGNOSIS — E538 Deficiency of other specified B group vitamins: Secondary | ICD-10-CM | POA: Diagnosis not present

## 2022-07-10 ENCOUNTER — Encounter: Payer: Self-pay | Admitting: Skilled Nursing Facility1

## 2022-07-10 ENCOUNTER — Encounter: Payer: 59 | Attending: Surgery | Admitting: Skilled Nursing Facility1

## 2022-07-10 VITALS — Ht 62.0 in | Wt 161.8 lb

## 2022-07-10 DIAGNOSIS — Z713 Dietary counseling and surveillance: Secondary | ICD-10-CM | POA: Diagnosis not present

## 2022-07-10 DIAGNOSIS — E669 Obesity, unspecified: Secondary | ICD-10-CM | POA: Diagnosis present

## 2022-07-10 DIAGNOSIS — Z6829 Body mass index (BMI) 29.0-29.9, adult: Secondary | ICD-10-CM | POA: Insufficient documentation

## 2022-07-10 NOTE — Progress Notes (Unsigned)
Bariatric Nutrition Follow-Up Visit Medical Nutrition Therapy   NUTRITION ASSESSMENT    Surgery date: 05/30/2021 Surgery type: RYGB  Start weight at [facility]: 264 Weight today: 161.8 pounds  Body Composition Scale 06/14/2021 10/11/2021 07/10/2022  Current Body Weight 256.0 217.0 161.8  Total Body Fat % 48.0 45.3 36.1  Visceral Fat 20 17 10   Fat-Free Mass % 51.9 54.6 63.8   Total Body Water % 40.4 41.8 46.4  Muscle-Mass lbs 29.1 27.5 27.8  BMI 46.1 41.1 29.4  Body Fat Displacement            Torso  lbs 76.2 60.8 36.1         Left Leg  lbs 15.2 12.1 7.2         Right Leg  lbs 15.2 12.1 7.2         Left Arm  lbs 7.6 6.0 3.6         Right Arm   lbs 7.6 6.0 3.6   Clinical  Medical hx: depression, HTN Medications: see list; multivitamin, calcium Labs: WBC 12.3, RBC 3.46, hemoglobin 10.3 Notable signs/symptoms: back and knee pain Any previous deficiencies? Anemia   Lifestyle & Dietary Hx  Pt states she did get a knee replacement and feels so great.  Pt states she does not like milk.   Estimated daily fluid intake: 56-60 oz Estimated daily protein intake: 19 g Supplements: multi and calcium Current average weekly physical activity: ADL's   24-Hr Dietary Recall First Meal 6:30-7: 1 slice wheat bread + 1 egg (breakfast with her client) Snack:   Second Meal 11-11:30: yoplait light and fit Snack:  trail mix  Third Meal: tossed salad: tomato, cheese, cucumber, pickle  Snack:  Beverages: water, un-sweet tea  Post-Op Goals/ Signs/ Symptoms Using straws: no Drinking while eating: no Chewing/swallowing difficulties: no Changes in vision: no Changes to mood/headaches: no Hair loss/changes to skin/nails: no Difficulty focusing/concentrating: no Sweating: no Limb weakness: no Dizziness/lightheadedness: no Palpitations: no  Carbonated/caffeinated beverages: no N/V/D/C/Gas: no Abdominal pain: no Dumping syndrome: no    NUTRITION DIAGNOSIS  Overweight/obesity ([place]-3.3)  related to past poor dietary habits and physical inactivity as evidenced by completed bariatric surgery and following dietary guidelines for continued weight loss and healthy nutrition status.     NUTRITION INTERVENTION Nutrition counseling (C-1) and education (E-2) to facilitate bariatric surgery goals, including: The importance of consuming adequate calories as well as certain nutrients daily due to the body's need for essential vitamins, minerals, and fats The importance of daily physical activity and to reach a goal of at least 150 minutes of moderate to vigorous physical activity weekly (or as directed by their physician) due to benefits such as increased musculature and improved lab values Encouraged pt to continue to eat balanced meals inclusive of non starchy vegetables 2 times a day 7 days a week Encouraged pt to choose lean protein sources: limiting beef, pork, sausage, hotdogs, and lunch meat Encourage pt to choose healthy fats such as plant based limiting animal fats Encouraged pt to continue to drink a minium 64 fluid ounces with half being plain water to satisfy proper hydration Why you need complex carbohydrates: Whole grains and other complex carbohydrates are required to have a healthy diet. Whole grains provide fiber which can help with blood glucose levels and help keep you satiated. Fruits and starchy vegetables provide essential vitamins and minerals required for immune function, eyesight support, brain support, bone density, wound healing and many other functions within the body. According to the  current evidenced based 2020-2025 Dietary Guidelines for Americans, complex carbohydrates are part of a healthy eating pattern which is associated with a decreased risk for type 2 diabetes, cancers, and cardiovascular disease.  Purpose of protein: Every cell in your body has protein. Protein is essential for the structure, function and regulation of tissues and organs within the body.  Without protein enzymes and antibodies would not exist, and cells would lack storage, transportation, and messenger systems. According to [facility]. [employer], the body is made up of at least 10000 different proteins. Lack of protein can lead to growth failure in children, loss of muscle mass, decreased immune system function, and overall weakening of various organs in the body.  SearchEngineCritic.nl, DoubleProperty.com.cy, PokerProtocol.pl Importance of vegetables To have an overall healthy diet, adult men and women are recommended to consume anywhere from 2-3 cups of vegetables daily. Vegetables provide a wide range of vitamins and minerals such as vitamin A, vitamin C, potassium, and folic acid. According to the [employer], including fruit and vegetables daily may reduce the risk of cardiovascular disease, certain cancers, and other non-communicable diseases. Creation of balanced and diverse meals to increase the intake of nutrient-rich foods that provide essential vitamins, minerals, fiber, and phytonutrients  Variety of Fruits and Vegetables:  Aim for a colorful array of fruits and vegetables to ensure a wide range of nutrients. Include a mix of leafy greens, berries, citrus fruits, cruciferous vegetables, and more. Whole Grains: Choose whole grains over refined grains. Examples include brown rice, quinoa, oats, whole wheat, and barley. Lean Proteins: Include lean sources of protein, such as poultry, fish, tofu, legumes, beans, lentils, and low-fat dairy products. Limit red and processed meats. Healthy Fats: Incorporate sources of healthy fats, including avocados, nuts, seeds, and olive oil. Limit saturated and trans fats found in fried and processed foods. Dairy or Dairy Alternatives: Choose low-fat or fat-free dairy products, or plant-based  alternatives like almond or soy milk. Portion Control: Be mindful of portion sizes to avoid overeating. Pay attention to hunger and satisfaction cues. Limit Added Sugars: Minimize the consumption of sugary beverages, snacks, and desserts. Check food labels for added sugars and opt for natural sources of sweetness such as whole fruits. Hydration: Drink plenty of water throughout the day. Limit sugary drinks and excessive caffeine intake. Moderate Sodium Intake: Reduce the consumption of high-sodium foods. Use herbs and spices for flavor instead of excessive salt. Meal Planning and Preparation: Plan and prepare meals ahead of time to make healthier choices more convenient. Include a mix of food groups in each meal. Limit Processed Foods: Minimize the intake of highly processed and packaged foods that are often high in added sugars, salt, and unhealthy fats. Regular Physical Activity: Combine a healthy diet with regular physical activity for overall well-being. Aim for at least 150 minutes of moderate-intensity aerobic exercise per week, along with strength training. Moderation and Balance: Enjoy treats and indulgent foods in moderation, emphasizing balance rather than strict restriction.   Goals: [employer] job!  Handouts Provided Include: Gave again: again  Meal Ideas handout  Learning Style & Readiness for Change Teaching method utilized: Visual & Auditory  Demonstrated degree of understanding via: Teach Back  Readiness Level: contemplative Barriers to learning/adherence to lifestyle change: cognitive ability    MONITORING & EVALUATION Dietary intake, weekly physical activity, body weight  Next Steps Patient is to follow-up: call or email with any future questions or concerns

## 2022-07-13 DIAGNOSIS — E538 Deficiency of other specified B group vitamins: Secondary | ICD-10-CM | POA: Diagnosis not present

## 2022-08-07 DIAGNOSIS — E039 Hypothyroidism, unspecified: Secondary | ICD-10-CM | POA: Diagnosis not present

## 2022-08-07 DIAGNOSIS — D649 Anemia, unspecified: Secondary | ICD-10-CM | POA: Diagnosis not present

## 2022-08-07 DIAGNOSIS — E538 Deficiency of other specified B group vitamins: Secondary | ICD-10-CM | POA: Diagnosis not present

## 2022-08-07 DIAGNOSIS — I10 Essential (primary) hypertension: Secondary | ICD-10-CM | POA: Diagnosis not present

## 2022-08-09 DIAGNOSIS — M545 Low back pain, unspecified: Secondary | ICD-10-CM | POA: Diagnosis not present

## 2022-08-09 DIAGNOSIS — D473 Essential (hemorrhagic) thrombocythemia: Secondary | ICD-10-CM | POA: Diagnosis not present

## 2022-08-09 DIAGNOSIS — D75839 Thrombocytosis, unspecified: Secondary | ICD-10-CM | POA: Diagnosis not present

## 2022-08-09 DIAGNOSIS — K219 Gastro-esophageal reflux disease without esophagitis: Secondary | ICD-10-CM | POA: Diagnosis not present

## 2022-08-09 DIAGNOSIS — J4540 Moderate persistent asthma, uncomplicated: Secondary | ICD-10-CM | POA: Diagnosis not present

## 2022-08-09 DIAGNOSIS — D649 Anemia, unspecified: Secondary | ICD-10-CM | POA: Diagnosis not present

## 2022-08-09 DIAGNOSIS — M170 Bilateral primary osteoarthritis of knee: Secondary | ICD-10-CM | POA: Diagnosis not present

## 2022-08-09 DIAGNOSIS — Z0000 Encounter for general adult medical examination without abnormal findings: Secondary | ICD-10-CM | POA: Diagnosis not present

## 2022-08-09 DIAGNOSIS — J309 Allergic rhinitis, unspecified: Secondary | ICD-10-CM | POA: Diagnosis not present

## 2022-08-09 DIAGNOSIS — E538 Deficiency of other specified B group vitamins: Secondary | ICD-10-CM | POA: Diagnosis not present

## 2022-09-04 ENCOUNTER — Ambulatory Visit: Payer: 59 | Admitting: Skilled Nursing Facility1

## 2022-09-21 DIAGNOSIS — E538 Deficiency of other specified B group vitamins: Secondary | ICD-10-CM | POA: Diagnosis not present

## 2022-10-18 DIAGNOSIS — E538 Deficiency of other specified B group vitamins: Secondary | ICD-10-CM | POA: Diagnosis not present

## 2022-10-25 ENCOUNTER — Emergency Department (HOSPITAL_COMMUNITY): Payer: 59

## 2022-10-25 ENCOUNTER — Encounter (HOSPITAL_COMMUNITY): Payer: Self-pay | Admitting: Emergency Medicine

## 2022-10-25 ENCOUNTER — Inpatient Hospital Stay (HOSPITAL_COMMUNITY)
Admission: EM | Admit: 2022-10-25 | Discharge: 2022-10-28 | DRG: 871 | Disposition: A | Discharge status: Home / Self Care | Payer: 59 | Attending: Internal Medicine | Admitting: Internal Medicine

## 2022-10-25 ENCOUNTER — Other Ambulatory Visit: Payer: Self-pay

## 2022-10-25 DIAGNOSIS — I10 Essential (primary) hypertension: Secondary | ICD-10-CM | POA: Diagnosis not present

## 2022-10-25 DIAGNOSIS — M1710 Unilateral primary osteoarthritis, unspecified knee: Secondary | ICD-10-CM | POA: Diagnosis present

## 2022-10-25 DIAGNOSIS — Z96652 Presence of left artificial knee joint: Secondary | ICD-10-CM | POA: Diagnosis present

## 2022-10-25 DIAGNOSIS — E039 Hypothyroidism, unspecified: Secondary | ICD-10-CM | POA: Diagnosis present

## 2022-10-25 DIAGNOSIS — E872 Acidosis, unspecified: Secondary | ICD-10-CM | POA: Diagnosis not present

## 2022-10-25 DIAGNOSIS — J189 Pneumonia, unspecified organism: Secondary | ICD-10-CM | POA: Diagnosis present

## 2022-10-25 DIAGNOSIS — J45909 Unspecified asthma, uncomplicated: Secondary | ICD-10-CM | POA: Diagnosis present

## 2022-10-25 DIAGNOSIS — Z974 Presence of external hearing-aid: Secondary | ICD-10-CM | POA: Diagnosis not present

## 2022-10-25 DIAGNOSIS — Z885 Allergy status to narcotic agent status: Secondary | ICD-10-CM | POA: Diagnosis not present

## 2022-10-25 DIAGNOSIS — K56609 Unspecified intestinal obstruction, unspecified as to partial versus complete obstruction: Secondary | ICD-10-CM | POA: Diagnosis not present

## 2022-10-25 DIAGNOSIS — Z1152 Encounter for screening for COVID-19: Secondary | ICD-10-CM

## 2022-10-25 DIAGNOSIS — W449XXA Unspecified foreign body entering into or through a natural orifice, initial encounter: Secondary | ICD-10-CM | POA: Diagnosis present

## 2022-10-25 DIAGNOSIS — Z791 Long term (current) use of non-steroidal anti-inflammatories (NSAID): Secondary | ICD-10-CM

## 2022-10-25 DIAGNOSIS — J984 Other disorders of lung: Secondary | ICD-10-CM | POA: Diagnosis not present

## 2022-10-25 DIAGNOSIS — Z96611 Presence of right artificial shoulder joint: Secondary | ICD-10-CM | POA: Diagnosis not present

## 2022-10-25 DIAGNOSIS — A419 Sepsis, unspecified organism: Secondary | ICD-10-CM | POA: Diagnosis not present

## 2022-10-25 DIAGNOSIS — K56600 Partial intestinal obstruction, unspecified as to cause: Secondary | ICD-10-CM | POA: Diagnosis present

## 2022-10-25 DIAGNOSIS — E785 Hyperlipidemia, unspecified: Secondary | ICD-10-CM | POA: Diagnosis not present

## 2022-10-25 DIAGNOSIS — Z79899 Other long term (current) drug therapy: Secondary | ICD-10-CM | POA: Diagnosis not present

## 2022-10-25 DIAGNOSIS — Z471 Aftercare following joint replacement surgery: Secondary | ICD-10-CM | POA: Diagnosis not present

## 2022-10-25 DIAGNOSIS — Z9884 Bariatric surgery status: Secondary | ICD-10-CM | POA: Diagnosis not present

## 2022-10-25 DIAGNOSIS — Z7951 Long term (current) use of inhaled steroids: Secondary | ICD-10-CM

## 2022-10-25 DIAGNOSIS — F32A Depression, unspecified: Secondary | ICD-10-CM | POA: Diagnosis present

## 2022-10-25 DIAGNOSIS — R188 Other ascites: Secondary | ICD-10-CM | POA: Diagnosis not present

## 2022-10-25 DIAGNOSIS — R918 Other nonspecific abnormal finding of lung field: Secondary | ICD-10-CM | POA: Diagnosis not present

## 2022-10-25 DIAGNOSIS — Z880 Allergy status to penicillin: Secondary | ICD-10-CM

## 2022-10-25 DIAGNOSIS — T162XXA Foreign body in left ear, initial encounter: Secondary | ICD-10-CM | POA: Diagnosis present

## 2022-10-25 DIAGNOSIS — K219 Gastro-esophageal reflux disease without esophagitis: Secondary | ICD-10-CM | POA: Diagnosis not present

## 2022-10-25 DIAGNOSIS — Z79890 Hormone replacement therapy (postmenopausal): Secondary | ICD-10-CM

## 2022-10-25 DIAGNOSIS — Z8249 Family history of ischemic heart disease and other diseases of the circulatory system: Secondary | ICD-10-CM

## 2022-10-25 DIAGNOSIS — R1032 Left lower quadrant pain: Secondary | ICD-10-CM | POA: Diagnosis not present

## 2022-10-25 DIAGNOSIS — Z90711 Acquired absence of uterus with remaining cervical stump: Secondary | ICD-10-CM | POA: Diagnosis not present

## 2022-10-25 DIAGNOSIS — K5730 Diverticulosis of large intestine without perforation or abscess without bleeding: Secondary | ICD-10-CM | POA: Diagnosis not present

## 2022-10-25 DIAGNOSIS — Z833 Family history of diabetes mellitus: Secondary | ICD-10-CM

## 2022-10-25 LAB — LIPASE, BLOOD: Lipase: 23 U/L (ref 11–51)

## 2022-10-25 LAB — CBC WITH DIFFERENTIAL/PLATELET
Abs Immature Granulocytes: 0.9 10*3/uL — ABNORMAL HIGH (ref 0.00–0.07)
Band Neutrophils: 22 %
Basophils Absolute: 0 10*3/uL (ref 0.0–0.1)
Basophils Relative: 0 %
Eosinophils Absolute: 0 10*3/uL (ref 0.0–0.5)
Eosinophils Relative: 0 %
HCT: 39.6 % (ref 36.0–46.0)
Hemoglobin: 13.2 g/dL (ref 12.0–15.0)
Lymphocytes Relative: 18 %
Lymphs Abs: 2.3 10*3/uL (ref 0.7–4.0)
MCH: 29.2 pg (ref 26.0–34.0)
MCHC: 33.3 g/dL (ref 30.0–36.0)
MCV: 87.6 fL (ref 80.0–100.0)
Metamyelocytes Relative: 6 %
Monocytes Absolute: 0.4 10*3/uL (ref 0.1–1.0)
Monocytes Relative: 3 %
Myelocytes: 1 %
Neutro Abs: 9.1 10*3/uL — ABNORMAL HIGH (ref 1.7–7.7)
Neutrophils Relative %: 50 %
Platelets: 227 10*3/uL (ref 150–400)
RBC: 4.52 MIL/uL (ref 3.87–5.11)
RDW: 15.4 % (ref 11.5–15.5)
WBC: 12.7 10*3/uL — ABNORMAL HIGH (ref 4.0–10.5)
nRBC: 0 % (ref 0.0–0.2)

## 2022-10-25 LAB — URINALYSIS, W/ REFLEX TO CULTURE (INFECTION SUSPECTED)
Bacteria, UA: NONE SEEN
Bilirubin Urine: NEGATIVE
Glucose, UA: NEGATIVE mg/dL
Hgb urine dipstick: NEGATIVE
Ketones, ur: NEGATIVE mg/dL
Leukocytes,Ua: NEGATIVE
Nitrite: NEGATIVE
Protein, ur: 30 mg/dL — AB
Specific Gravity, Urine: 1.032 — ABNORMAL HIGH (ref 1.005–1.030)
pH: 7 (ref 5.0–8.0)

## 2022-10-25 LAB — LACTIC ACID, PLASMA
Lactic Acid, Venous: 2.7 mmol/L (ref 0.5–1.9)
Lactic Acid, Venous: 1.7 mmol/L (ref 0.5–1.9)

## 2022-10-25 LAB — COMPREHENSIVE METABOLIC PANEL
ALT: 20 U/L (ref 0–44)
AST: 32 U/L (ref 15–41)
Albumin: 3.3 g/dL — ABNORMAL LOW (ref 3.5–5.0)
Alkaline Phosphatase: 82 U/L (ref 38–126)
Anion gap: 11 (ref 5–15)
BUN: 33 mg/dL — ABNORMAL HIGH (ref 6–20)
CO2: 28 mmol/L (ref 22–32)
Calcium: 8.7 mg/dL — ABNORMAL LOW (ref 8.9–10.3)
Chloride: 96 mmol/L — ABNORMAL LOW (ref 98–111)
Creatinine, Ser: 1.18 mg/dL — ABNORMAL HIGH (ref 0.44–1.00)
GFR, Estimated: 54 mL/min — ABNORMAL LOW (ref >60)
Glucose, Bld: 104 mg/dL — ABNORMAL HIGH (ref 70–99)
Potassium: 4.4 mmol/L (ref 3.5–5.1)
Sodium: 135 mmol/L (ref 135–145)
Total Bilirubin: 0.5 mg/dL (ref 0.3–1.2)
Total Protein: 6.7 g/dL (ref 6.5–8.1)

## 2022-10-25 LAB — SARS CORONAVIRUS 2 BY RT PCR: SARS Coronavirus 2 by RT PCR: NEGATIVE

## 2022-10-25 LAB — PHOSPHORUS: Phosphorus: 4 mg/dL (ref 2.5–4.6)

## 2022-10-25 LAB — TROPONIN I (HIGH SENSITIVITY): Troponin I (High Sensitivity): 24 ng/L — ABNORMAL HIGH (ref <18)

## 2022-10-25 MED ORDER — PIPERACILLIN-TAZOBACTAM 3.375 G IVPB 30 MIN: 3.3750 g | Freq: Once | INTRAVENOUS | Status: DC

## 2022-10-25 MED ORDER — ONDANSETRON HCL 4 MG/2ML IJ SOLN
4.0000 mg | Freq: Four times a day (QID) | INTRAMUSCULAR | Status: DC | PRN
  Administered 2022-10-25: 4 mg via INTRAVENOUS
  Filled 2022-10-25: qty 2

## 2022-10-25 MED ORDER — MORPHINE SULFATE (PF) 2 MG/ML IV SOLN
2.0000 mg | INTRAVENOUS | Status: DC | PRN
  Administered 2022-10-25 – 2022-10-27 (×5): 2 mg via INTRAVENOUS
  Filled 2022-10-25 (×5): qty 1

## 2022-10-25 MED ORDER — ENOXAPARIN SODIUM 40 MG/0.4ML IJ SOSY
40.0000 mg | PREFILLED_SYRINGE | INTRAMUSCULAR | Status: DC
  Administered 2022-10-26 – 2022-10-27 (×2): 40 mg via SUBCUTANEOUS
  Filled 2022-10-25 (×2): qty 0.4

## 2022-10-25 MED ORDER — THIAMINE HCL 100 MG/ML IJ SOLN
INTRAMUSCULAR | Status: AC
  Filled 2022-10-25: qty 6

## 2022-10-25 MED ORDER — PIPERACILLIN-TAZOBACTAM 3.375 G IVPB
3.3750 g | Freq: Three times a day (TID) | INTRAVENOUS | Status: DC
  Administered 2022-10-25 – 2022-10-28 (×8): 3.375 g via INTRAVENOUS
  Filled 2022-10-25 (×9): qty 50

## 2022-10-25 MED ORDER — THIAMINE HCL 100 MG/ML IJ SOLN
500.0000 mg | INTRAVENOUS | Status: DC
  Administered 2022-10-25 – 2022-10-27 (×3): 500 mg via INTRAVENOUS
  Filled 2022-10-25 (×4): qty 5

## 2022-10-25 MED ORDER — SODIUM CHLORIDE 0.9 % IV SOLN
500.0000 mg | Freq: Once | INTRAVENOUS | Status: AC
  Administered 2022-10-25: 500 mg via INTRAVENOUS
  Filled 2022-10-25: qty 5

## 2022-10-25 MED ORDER — HYDRALAZINE HCL 20 MG/ML IJ SOLN
5.0000 mg | INTRAMUSCULAR | Status: DC | PRN
  Filled 2022-10-25: qty 1

## 2022-10-25 MED ORDER — LACTATED RINGERS IV SOLN: INTRAVENOUS | Status: DC

## 2022-10-25 MED ORDER — ONDANSETRON HCL 4 MG PO TABS: 4.0000 mg | ORAL_TABLET | Freq: Four times a day (QID) | ORAL | Status: DC | PRN

## 2022-10-25 MED ORDER — LACTATED RINGERS IV SOLN: INTRAVENOUS | Status: AC

## 2022-10-25 MED ORDER — SODIUM CHLORIDE 0.9 % IV SOLN
1.0000 g | Freq: Once | INTRAVENOUS | Status: AC
  Administered 2022-10-25: 1 g via INTRAVENOUS
  Filled 2022-10-25: qty 10

## 2022-10-25 MED ORDER — ALBUTEROL SULFATE (2.5 MG/3ML) 0.083% IN NEBU: 2.5000 mg | INHALATION_SOLUTION | RESPIRATORY_TRACT | Status: DC | PRN

## 2022-10-25 MED ORDER — ONDANSETRON HCL 4 MG/2ML IJ SOLN
4.0000 mg | Freq: Once | INTRAMUSCULAR | Status: AC
  Administered 2022-10-25: 4 mg via INTRAVENOUS
  Filled 2022-10-25: qty 2

## 2022-10-25 MED ORDER — PANTOPRAZOLE SODIUM 40 MG IV SOLR
40.0000 mg | INTRAVENOUS | Status: DC
  Administered 2022-10-25 – 2022-10-27 (×3): 40 mg via INTRAVENOUS
  Filled 2022-10-25 (×3): qty 10

## 2022-10-25 MED ORDER — LACTATED RINGERS IV BOLUS
1000.0000 mL | Freq: Once | INTRAVENOUS | Status: AC
  Administered 2022-10-25: 1000 mL via INTRAVENOUS

## 2022-10-25 MED ORDER — IOHEXOL 300 MG/ML  SOLN
100.0000 mL | Freq: Once | INTRAMUSCULAR | Status: AC | PRN
  Administered 2022-10-25: 100 mL via INTRAVENOUS

## 2022-10-25 NOTE — ED Notes (Signed)
See triage notes. Mm moist. Color wnl. Nad at this time. Pt slightly lethargic

## 2022-10-25 NOTE — H&P (Signed)
[facility] H&P   Patient Demographics:    Jessica Stevens, is a 57 y.o. female  MRN: [id number]   DOB - 1965-07-08  Admit Date - 10/25/2022  Outpatient Primary MD for the patient is Benita Stabile, MD  Referring MD/NP/PA: Dr Rubin Payor    Patient coming from: home  Chief Complaint  Patient presents with   Abdominal Pain      HPI:    Jessica Stevens  is a 57 y.o. 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  Patient reports she difficulty hearing as there is a piece of left ear hearing aid stuck in her ear canal since Monday (was removed by ED)-  - in ED patient was noted to be tachycardic, afebrile, white blood cell count elevated at 12.7, lactic acid elevated at 2.7 improved with IV fluids to 1.7, imaging significant for ileus versus SBO, and multifocal pneumonia, creatinine elevated at 1.18, discussed with Dr. Lovell Sheehan, who requested to admit to any pain hospital by [facility], he will evaluate in a.m., [facility] consulted to admit  Review of systems:      A full 10 point Review of Systems was done, except as stated above, all other Review of Systems were negative.   With Past History of the following :    Past Medical History:  Diagnosis Date   Allergy    Anemia    Ankle pain, left    Anxiety     Arthritis of knee, degenerative    Asthma    Carpal tunnel syndrome    Cervical radiculopathy    Chronic back pain    Complete rupture of rotator cuff    Complete rupture of rotator cuff    Depression    GERD (gastroesophageal reflux disease)    Gynecomastia    HNP (herniated nucleus pulposus), cervical    HNP (herniated nucleus pulposus), cervical    Hyperlipidemia    Hypertension    Hypothyroidism    Knee pain    OA (osteoarthritis) of knee    Obesity    Sleep apnea    hx of    Urinary incontinence       Past Surgical History:  Procedure Laterality Date   arthroscopy  left knee  08/14/2008   Dr. Romeo Apple    BIOPSY  09/20/2020   Procedure: BIOPSY;  Surgeon: Sherrilyn Rist, MD;  Location: [facility];  Service: Gastroenterology;;   COLONOSCOPY N/A 03/11/2014   Procedure: COLONOSCOPY;  Surgeon: Malissa Hippo, MD;  Location: [facility];  Service: Endoscopy;  Laterality: N/A;  730 - moved to 1/13 @ 12:00   COLONOSCOPY     ESOPHAGOGASTRODUODENOSCOPY (EGD) WITH PROPOFOL N/A 09/20/2020   Procedure: ESOPHAGOGASTRODUODENOSCOPY (EGD) WITH PROPOFOL;  Surgeon: Sherrilyn Rist, MD;  Location: [facility];  Service: Gastroenterology;  Laterality: N/A;   GASTRIC BYPASS  05/2021   PARTIAL HYSTERECTOMY     POLYPECTOMY     REVERSE SHOULDER ARTHROPLASTY Right 05/27/2020   Procedure: REVERSE SHOULDER ARTHROPLASTY;  Surgeon: Jones Broom, MD;  Location: [facility];  Service: Orthopedics;  Laterality: Right;   TOTAL KNEE ARTHROPLASTY Left 03/24/2022   Procedure: TOTAL KNEE ARTHROPLASTY;  Surgeon: Beverely Low, MD;  Location: [facility];  Service: Orthopedics;  Laterality: Left;  spinal and general   tube insert in right ear  02/27/2001   UPPER GI ENDOSCOPY N/A 05/30/2021   Procedure: UPPER GI ENDOSCOPY;  Surgeon: Quentin Ore, MD;  Location: [facility];  Service: General;  Laterality: N/A;      Social History:     Social History   Tobacco Use   Smoking status: Never    Smokeless tobacco: Never  Substance Use Topics   Alcohol use: Not Currently    Comment: occasionally         Family History :     Family History  Problem Relation Age of Onset   Diabetes Mother    Hypertension Mother    Cancer Father        left nephrectomy    Hypertension Father    Hypertension Sister    Diabetes Maternal Grandmother    Hypertension Maternal Grandmother    Hypertension Maternal Grandfather    Colon cancer Neg Hx    Colon polyps Neg Hx    Esophageal cancer Neg Hx    Rectal cancer Neg Hx    Stomach cancer Neg Hx       Home Medications:   Prior to Admission medications   Medication Sig Start Date End Date Taking? Authorizing Provider  busPIRone (BUSPAR) 5 MG tablet Take 5 mg by mouth 2 (two) times daily as needed (stress). 04/25/20  Yes [provider]  clonazePAM (KLONOPIN) 0.5 MG tablet Take 0.5 mg by mouth 2 (two) times daily as needed for anxiety. 08/29/17  Yes [provider]  HYDROcodone-acetaminophen (NORCO) 10-325 MG tablet Take 1 tablet by mouth 3 (three) times daily as needed for moderate pain. 06/15/21  Yes [provider]  HYDROmorphone (DILAUDID) 2 MG tablet Take 1 tablet (2 mg total) by mouth every 4 (four) hours as needed for severe pain (for severe pain only and do not take with Hydrocodone). 03/24/22  Yes Beverely Low, MD  levocetirizine (XYZAL) 5 MG tablet Take 5 mg by mouth every evening.   Yes [provider]  meloxicam (MOBIC) 15 MG tablet Take 15 mg by mouth daily.   Yes [provider]  ondansetron (ZOFRAN) 4 MG tablet Take 1 tablet (4 mg total) by mouth every 8 (eight) hours as needed for nausea, vomiting or refractory nausea / vomiting. 03/24/22 03/24/23 Yes Beverely Low, MD  rosuvastatin (CRESTOR) 20 MG tablet Take 20 mg by mouth daily. 04/25/20  Yes [provider]  SYMBICORT 80-4.5 MCG/ACT inhaler Inhale 2 puffs into the lungs 2 (two) times daily. 10/09/22  Yes [provider]   tiZANidine (ZANAFLEX) 2 MG tablet Take 2 mg by  mouth 3 (three) times daily as needed. 10/09/22  Yes [provider]  albuterol (PROVENTIL HFA;VENTOLIN HFA) 108 (90 BASE) MCG/ACT inhaler Inhale 2 puffs into the lungs 2 (two) times daily as needed for wheezing or shortness of breath.    [provider]  amLODipine (NORVASC) 10 MG tablet Take 10 mg by mouth daily.    [provider]  amLODipine (NORVASC) 5 MG tablet Take 1 tablet by mouth daily.    [provider]  ANORO ELLIPTA 62.5-25 MCG/INH AEPB Inhale 1 puff into the lungs daily. 08/19/20   [provider]  Biotin 800 MCG TABS Take 1 tablet by mouth daily. 11/12/17   [provider]  buPROPion (WELLBUTRIN SR) 150 MG 12 hr tablet Take 150 mg by mouth 2 (two) times daily. 08/29/17   [provider]  buPROPion (WELLBUTRIN XL) 150 MG 24 hr tablet Take 150 mg by mouth in the morning and at bedtime.    [provider]  Crisaborole (EUCRISA) 2 % OINT Apply 1 Application topically in the morning and at bedtime. 08/29/17   [provider]  cyanocobalamin (VITAMIN B12) 1000 MCG/ML injection Inject 1,000 mcg into the muscle every 30 (thirty) days.    [provider]  diclofenac Sodium (VOLTAREN) 1 % GEL Apply 2 g topically daily as needed (pain).    [provider]  DULoxetine (CYMBALTA) 30 MG capsule Take 30 mg by mouth daily. 08/29/17   [provider]  DULoxetine (CYMBALTA) 60 MG capsule Take 60 mg by mouth 2 (two) times daily. 11/05/18   [provider]  furosemide (LASIX) 20 MG tablet Take 20 mg by mouth every morning. 06/14/21   [provider]  hydrALAZINE (APRESOLINE) 25 MG tablet Take 25 mg by mouth 2 (two) times daily.    [provider]  levothyroxine (SYNTHROID) 112 MCG tablet Take 112 mcg by mouth daily before breakfast. 11/05/18   [provider]  losartan (COZAAR) 100 MG tablet Take 100 mg by mouth daily.     [provider]  losartan (COZAAR) 50 MG tablet Take 1 tablet by mouth daily.    [provider]  Melatonin 10 MG CAPS Take 10 mg by mouth at bedtime as needed (sleep). 11/12/17   [provider]  mometasone-formoterol (DULERA) 100-5 MCG/ACT AERO Inhale 2 puffs into the lungs daily. 08/29/17   [provider]  montelukast (SINGULAIR) 10 MG tablet Take 10 mg by mouth at bedtime as needed (allergies).    [provider]  Multiple Vitamins-Minerals (AIRBORNE PO) Take 1 tablet by mouth in the morning and at bedtime.    [provider]  Multiple Vitamins-Minerals (HAIR SKIN & NAILS) TABS Take 1 tablet by mouth daily.    [provider]  ondansetron (ZOFRAN-ODT) 4 MG disintegrating tablet Take 1 tablet (4 mg total) by mouth every 6 (six) hours as needed for nausea or vomiting. 05/31/21   Stechschulte, Hyman Hopes, MD  pantoprazole (PROTONIX) 40 MG tablet Take 1 tablet (40 mg total) by mouth daily. 05/31/21   Stechschulte, Hyman Hopes, MD  potassium chloride (MICRO-K) 10 MEQ CR capsule Take 10 mEq by mouth daily. 06/14/21   [provider]  tiZANidine (ZANAFLEX) 4 MG tablet Take 1 tablet (4 mg total) by mouth every 8 (eight) hours as needed for muscle spasms. 03/24/22   Beverely Low, MD     Allergies:     Allergies  Allergen Reactions   Penicillins Other (See Comments)  According to Allergy Test.  Patient states she is not allergic to this medication   Oxycodone-Acetaminophen Rash    Not allergic to tylenol     Physical Exam:   Vitals  Blood pressure 125/77, pulse (!) 103, temperature 98.9 F (37.2 C), temperature source Oral, resp. rate (!) 22, height 5' 0.5" (1.537 m), weight 68 kg, SpO2 93%.   1. General Developed female, laying in bed, in mild discomfort secondary to nausea and abdominal discomfort  2. Normal affect and insight, Not Suicidal or Homicidal, Awake Alert, Oriented X 3.  3. No F.N deficits, ALL C.Nerves Intact,  Strength 5/5 all 4 extremities, Sensation intact all 4 extremities, Plantars down going.  4. Ears and Eyes appear Normal, Conjunctivae clear, PERRLA. Moist Oral Mucosa.  5. Supple Neck, No JVD, No cervical lymphadenopathy appriciated, No Carotid Bruits.  6. Symmetrical Chest wall movement, Good air movement bilaterally, rales at the bases.  7.  Tachycardic, No Gallops, Rubs or Murmurs, No Parasternal Heave.  8.  Sounds present, encourage increased, diffuse tenderness to palpation, but no guarding  9.  No Cyanosis, Normal Skin Turgor, No Skin Rash or Bruise.  10. Good muscle tone,  joints appear normal , no effusions, Normal ROM.   Data Review:    CBC Recent Labs  Lab 10/25/22 1305  WBC 12.7*  HGB 13.2  HCT 39.6  PLT 227  MCV 87.6  MCH 29.2  MCHC 33.3  RDW 15.4  LYMPHSABS 2.3  MONOABS 0.4  EOSABS 0.0  BASOSABS 0.0   ------------------------------------------------------------------------------------------------------------------  Chemistries  Recent Labs  Lab 10/25/22 1305  NA 135  K 4.4  CL 96*  CO2 28  GLUCOSE 104*  BUN 33*  CREATININE 1.18*  CALCIUM 8.7*  AST 32  ALT 20  ALKPHOS 82  BILITOT 0.5   ------------------------------------------------------------------------------------------------------------------ estimated creatinine clearance is 45.8 mL/min (A) (by C-G formula based on SCr of 1.18 mg/dL (H)). ------------------------------------------------------------------------------------------------------------------ No results for input(s): "TSH", "T4TOTAL", "T3FREE", "THYROIDAB" in the last 72 hours.  Invalid input(s): "FREET3"  Coagulation profile No results for input(s): "INR", "PROTIME" in the last 168 hours. ------------------------------------------------------------------------------------------------------------------- No results for input(s): "DDIMER" in the last 72  hours. -------------------------------------------------------------------------------------------------------------------  Cardiac Enzymes No results for input(s): "CKMB", "TROPONINI", "MYOGLOBIN" in the last 168 hours.  Invalid input(s): "CK" ------------------------------------------------------------------------------------------------------------------ No results found for: "BNP"   ---------------------------------------------------------------------------------------------------------------  Urinalysis    Component Value Date/Time   COLORURINE YELLOW 10/25/2022 1702   APPEARANCEUR CLEAR 10/25/2022 1702   LABSPEC 1.032 (H) 10/25/2022 1702   PHURINE 7.0 10/25/2022 1702   GLUCOSEU NEGATIVE 10/25/2022 1702   HGBUR NEGATIVE 10/25/2022 1702   BILIRUBINUR NEGATIVE 10/25/2022 1702   KETONESUR NEGATIVE 10/25/2022 1702   PROTEINUR 30 (A) 10/25/2022 1702   UROBILINOGEN 0.2 03/06/2011 1934   NITRITE NEGATIVE 10/25/2022 1702   LEUKOCYTESUR NEGATIVE 10/25/2022 1702    ----------------------------------------------------------------------------------------------------------------   Imaging Results:    CT ABDOMEN PELVIS W CONTRAST  Result Date: 10/25/2022 CLINICAL DATA:  Day history of left lower quadrant abdominal pain and generalized weakness EXAM: CT ABDOMEN AND PELVIS WITH CONTRAST TECHNIQUE: Multidetector CT imaging of the abdomen and pelvis was performed using the standard protocol following bolus administration of intravenous contrast. RADIATION DOSE REDUCTION: This exam was performed according to the departmental dose-optimization program which includes automated exposure control, adjustment of the mA and/or kV according to patient size and/or use of iterative reconstruction technique. CONTRAST:  OMNIPAQUE IOHEXOL 300 MG/ML  SOLN COMPARISON:  CT abdomen and pelvis dated 06/27/2021 FINDINGS: Lower chest:  Multifocal patchy ground-glass and peripheral consolidative opacities. No  pleural effusion or pneumothorax demonstrated. Partially imaged heart size is normal. Hepatobiliary: No focal hepatic lesions. No intra or extrahepatic biliary ductal dilation. Normal gallbladder. Pancreas: No focal lesions or main ductal dilation. Spleen: Normal in size without focal abnormality. Adrenals/Urinary Tract: No adrenal nodules. No suspicious renal mass, calculi or hydronephrosis. No focal bladder wall thickening. Stomach/Bowel: Postsurgical changes from bypass. Diffuse small bowel dilation with multiple loops demonstrating caliber narrowing centered in the right lower quadrant mesentery. Colonic diverticulosis without acute diverticulitis. Normal appendix. Vascular/Lymphatic: Aortic atherosclerosis. No enlarged abdominal or pelvic lymph nodes. Reproductive: No adnexal masses. Other: Small volume ascites.  No free air or fluid collection. Musculoskeletal: No acute or abnormal lytic or blastic osseous lesions. Multilevel degenerative changes of the partially imaged thoracic and lumbar spine. Unchanged grade 1 anterolisthesis at L3-4 and L4-5 with grade 1 retrolisthesis at L5-S1. Similar vertebral body height loss of L5. Mild body wall edema. IMPRESSION: 1. Diffuse small bowel dilation with multiple loops demonstrating caliber narrowing centered in the right lower quadrant mesentery, suspicious for ileus versus partial small bowel obstruction. 2. Multifocal patchy ground-glass and peripheral consolidative opacities in the lung bases, suspicious for multifocal pneumonia. 3. Small volume ascites. 4. Colonic diverticulosis without acute diverticulitis. 5. Aortic Atherosclerosis (ICD10-I70.0). Electronically Signed   By: Agustin Cree M.D.   On: 10/25/2022 15:40   DG Chest Portable 1 View  Result Date: 10/25/2022 CLINICAL DATA:  Weakness with chest and abdominal pain. EXAM: PORTABLE CHEST 1 VIEW COMPARISON:  10/28/2020 FINDINGS: Asymmetric airspace disease is mid and lower lung predominant, left greater than  right. No pulmonary edema or pleural effusion. The cardio pericardial silhouette is enlarged. Status post right shoulder replacement. Degenerative changes noted left shoulder. IMPRESSION: Asymmetric airspace disease, left greater than right. Imaging features suggest multifocal pneumonia. The bilateral lung opacity has a somewhat nodular character and close follow-up recommended to exclude metastatic disease. Electronically Signed   By: Kennith Center M.D.   On: 10/25/2022 13:19     EKG:  Vent. rate 94 BPM PR interval 131 ms QRS duration 89 ms QT/QTcB 341/427 ms P-R-T axes 54 60 30 Sinus rhythm Consider left ventricular hypertrophy  Assessment & Plan:    Principal Problem:   SBO (small bowel obstruction) (HCC) Active Problems:   Asthma   GERD    Small bowel obstruction versus ileus -Post Roux-en-Y surgery of 2023, with 150 pound weight loss -Making significant for ileus versus SBO, have discussed with radiologist who reviewed the imaging, no concern for internal hernia -Keep n.p.o., given her surgery it will be difficult to insert NG tube as discussed with Dr. Lovell Sheehan, so we will keep n.p.o. for now, continue with IV fluids, as needed pain medication or nausea medicine.  Sepsis secondary to pneumonia -Abscess present on admission, leukocytosis, lactic acidosis, tachycardia -Significant for of pneumonia, most likely aspiration, will broaden antibiotic to Zosyn  Status post gastric bypass -Will start on IV thiamine given poor oral intake for last few days.  Patient reports she has been compliant with her bariatric multivitamin regimen -Will monitor her phosphorus closely when she is back on oral intake as high risk for refeeding syndrome  Hypertension -Patient reports she has been weaned off her medicine as blood pressure has been improving with weight loss, blood pressure is soft to acceptable, will hold all meds now, and will keep as needed hydralazine.  GERD -Continue with  PPI  Foreign body in the left ear -reported had  small part left in her ear canal from her hearing aid, was removed by ED Julien Girt  Histroy of asthma -No wheezing, continue with as needed nebs.    DVT Prophylaxis  Lovenox  AM Labs Ordered, also please review Full Orders  Family Communication: Admission, patients condition and plan of care including tests being ordered have been discussed with the patient  who indicate understanding and agree with the plan and Code Status.  Code Status full  Likely DC to  home  Condition GUARDED    Consults called: general surgery    Admission status: inpatient    Time spent in minutes : 70 minutes   Huey Bienenstock M.D on 10/25/2022 at 5:50 PM   [facility] - Office  [phone]

## 2022-10-25 NOTE — ED Notes (Signed)
Dr Randol Kern aware of piece of hearing aid in left ear.

## 2022-10-25 NOTE — ED Notes (Signed)
Date and time results received: 10/25/22 1500 (use smartphrase ".now" to insert current time)  Test: lactic acid  Critical Value: 2.7  Name of Provider Notified: Dr Criss Alvine  Orders Received? Or Actions Taken?: Orders Received - See Orders for details

## 2022-10-25 NOTE — ED Provider Notes (Signed)
[place] [facility]PARTMENT AT [facility] Provider Note   CSN: [id number] Arrival date & time: 10/25/22  1219     History  Chief Complaint  Patient presents with   Abdominal Pain    Jessica Stevens is a 57 y.o. female.  HPI 57 year old female presents with abdominal pain.  Symptoms started 2 nights ago and she has had diarrhea that is loose and watery as well as periumbilical abdominal pain.  She has felt hot like she has had a fever but has not checked her temperature.  No vomiting but has had some on and off nausea.  No chest pain, shortness of breath, cough.  She has had some headache and feels generally weak.  No urinary symptoms. Has had body aches.  Home Medications Prior to Admission medications   Medication Sig Start Date End Date Taking? Authorizing Provider  albuterol (PROVENTIL HFA;VENTOLIN HFA) 108 (90 BASE) MCG/ACT inhaler Inhale 2 puffs into the lungs 2 (two) times daily as needed for wheezing or shortness of breath.    [provider]  amLODipine (NORVASC) 10 MG tablet Take 10 mg by mouth daily.    [provider]  ANORO ELLIPTA 62.5-25 MCG/INH AEPB Inhale 1 puff into the lungs daily. 08/19/20   [provider]  buPROPion (WELLBUTRIN XL) 150 MG 24 hr tablet Take 150 mg by mouth in the morning and at bedtime.    [provider]  busPIRone (BUSPAR) 5 MG tablet Take 5 mg by mouth 2 (two) times daily as needed (stress). 04/25/20   [provider]  cyanocobalamin (VITAMIN B12) 1000 MCG/ML injection Inject 1,000 mcg into the muscle every 30 (thirty) days.    [provider]  diclofenac Sodium (VOLTAREN) 1 % GEL Apply 2 g topically daily as needed (pain).    [provider]  DULoxetine (CYMBALTA) 60 MG capsule Take 60 mg by mouth 2 (two) times daily. 11/05/18   [provider]  fluticasone (FLONASE) 50 MCG/ACT nasal spray Place 1 spray into both nostrils daily for 10 days. Patient not taking:  Reported on 03/10/2022 09/14/19 09/24/19  Burgess Amor, PA-C  furosemide (LASIX) 20 MG tablet Take 20 mg by mouth every morning. 06/14/21   [provider]  gabapentin (NEURONTIN) 100 MG capsule Take 2 capsules (200 mg total) by mouth every 12 (twelve) hours. Patient not taking: Reported on 03/10/2022 05/31/21   Stechschulte, Hyman Hopes, MD  hydrALAZINE (APRESOLINE) 25 MG tablet Take 25 mg by mouth 2 (two) times daily.    [provider]  HYDROcodone-acetaminophen (NORCO) 10-325 MG tablet Take 1 tablet by mouth 3 (three) times daily as needed for moderate pain. 06/15/21   [provider]  HYDROmorphone (DILAUDID) 2 MG tablet Take 1 tablet (2 mg total) by mouth every 4 (four) hours as needed for severe pain (for severe pain only and do not take with Hydrocodone). 03/24/22   Beverely Low, MD  levocetirizine (XYZAL) 5 MG tablet Take 5 mg by mouth every evening.    [provider]  levothyroxine (SYNTHROID) 112 MCG tablet Take 112 mcg by mouth daily before breakfast. 11/05/18   [provider]  losartan (COZAAR) 100 MG tablet Take 100 mg by mouth daily.    [provider]  meloxicam (MOBIC) 15 MG tablet Take 15 mg by mouth daily.    [provider]  montelukast (SINGULAIR) 10 MG tablet Take 10 mg by mouth at bedtime as needed (allergies).    [provider]  Multiple Vitamins-Minerals (AIRBORNE PO) Take 1 tablet by mouth in the morning and at bedtime.    [provider]  Multiple Vitamins-Minerals (HAIR SKIN & NAILS) TABS Take 1 tablet by mouth daily.    [provider]  ondansetron (ZOFRAN) 4 MG tablet Take 1 tablet (4 mg total) by mouth every 8 (eight) hours as needed for nausea, vomiting or refractory nausea / vomiting. 03/24/22 03/24/23  Beverely Low, MD  ondansetron (ZOFRAN-ODT) 4 MG disintegrating tablet Take 1 tablet (4 mg total) by mouth every 6 (six) hours as needed for nausea or vomiting. 05/31/21   Stechschulte, Hyman Hopes, MD   pantoprazole (PROTONIX) 40 MG tablet Take 1 tablet (40 mg total) by mouth daily. 05/31/21   Stechschulte, Hyman Hopes, MD  potassium chloride (MICRO-K) 10 MEQ CR capsule Take 10 mEq by mouth daily. 06/14/21   [provider]  rosuvastatin (CRESTOR) 20 MG tablet Take 20 mg by mouth daily. 04/25/20   [provider]  tiZANidine (ZANAFLEX) 4 MG tablet Take 1 tablet (4 mg total) by mouth every 8 (eight) hours as needed for muscle spasms. 03/24/22   Beverely Low, MD  traMADol (ULTRAM) 50 MG tablet Take 1 tablet (50 mg total) by mouth every 6 (six) hours as needed (pain). Patient not taking: Reported on 06/27/2021 05/31/21   Stechschulte, Hyman Hopes, MD      Allergies    Penicillins and Oxycodone-acetaminophen    Review of Systems   Review of Systems  Constitutional:  Positive for fever (subjective).  Respiratory:  Negative for cough and shortness of breath.   Cardiovascular:  Negative for chest pain.  Gastrointestinal:  Positive for abdominal pain and diarrhea. Negative for blood in stool and vomiting.  Genitourinary:  Negative for dysuria.  Musculoskeletal:  Positive for myalgias.  Neurological:  Positive for weakness.    Physical Exam Updated Vital Signs BP 116/79 (BP Location: Left Arm)   Pulse (!) 114   Temp 98 F (36.7 C) (Oral)   Resp 16   Ht 5' 0.5" (1.537 m)   Wt 68 kg   SpO2 (!) 84%   BMI 28.81 kg/m  Physical Exam Vitals and nursing note reviewed.  Constitutional:      Appearance: She is well-developed. She is not diaphoretic.  HENT:     Head: Normocephalic and atraumatic.  Cardiovascular:     Rate and Rhythm: Regular rhythm. Tachycardia present.     Heart sounds: Normal heart sounds.  Pulmonary:     Effort: Pulmonary effort is normal.     Breath sounds: Normal breath sounds.  Abdominal:     Palpations: Abdomen is soft.     Tenderness: There is generalized abdominal tenderness (upper more than lower).  Skin:    General: Skin is warm and dry.  Neurological:      Mental Status: She is alert.     ED Results / Procedures / Treatments   Labs (all labs ordered are listed, but only abnormal results are displayed) Labs Reviewed  SARS CORONAVIRUS 2 BY RT PCR  COMPREHENSIVE METABOLIC PANEL  LIPASE, BLOOD  URINALYSIS, W/ REFLEX TO CULTURE (INFECTION SUSPECTED)  CBC WITH DIFFERENTIAL/PLATELET  TROPONIN I (HIGH SENSITIVITY)    EKG None  Radiology No results found.  Procedures Procedures    Medications Ordered in ED Medications  lactated ringers bolus 1,000 mL (has no administration in time range)    ED Course/ Medical Decision Making/ A&P  Medical Decision Making Amount and/or Complexity of Data Reviewed Labs: ordered.    Details: Leukocytosis and mild lactic acidosis.  Radiology: ordered.    Details: Pneumonia ECG/medicine tests: ordered and independent interpretation performed.    Details: Nonspecific, no ST elevations/depressions.  Risk Prescription drug management.   Chest x-ray is concerning for pneumonia.  She does not have a cough but does have leukocytosis and lactic acidosis as well as tachycardia.  Was briefly hypoxic but I suspect this was poor waveform from the pulse ox as she is not hypoxic any longer.  CT scan pending but I suspect she will need admission for pneumonia and antibiotics. Care transferred to Dr. Rubin Payor.        Final Clinical Impression(s) / ED Diagnoses Final diagnoses:  None    Rx / DC Orders ED Discharge Orders     None         Pricilla Loveless, MD 10/25/22 1546

## 2022-10-25 NOTE — ED Provider Notes (Addendum)
  Physical Exam  BP 128/71   Pulse 96   Temp 98.9 F (37.2 C) (Oral)   Resp (!) 24   Ht 5' 0.5" (1.537 m)   Wt 68 kg   SpO2 96%   BMI 28.81 kg/m   Physical Exam  Procedures  Procedures  ED Course / MDM    Medical Decision Making Amount and/or Complexity of Data Reviewed Labs: ordered. Radiology: ordered.  Risk Prescription drug management.   Received patient in signout.  Shortness of breath abdominal pain nausea.  Multifocal pneumonia on x-ray.  CT scan shows ileus versus partial small bowel obstruction.  Previous Roux-en-Y gastric bypass.  Will discuss with general surgery but not actively vomiting at this time.  Will require admission to hospital.  No NG tube.Dr Lovell Sheehan will see tomorrow.  Past Surgical History:  Procedure Laterality Date   arthroscopy  left knee  08/14/2008   Dr. Romeo Apple    BIOPSY  09/20/2020   Procedure: BIOPSY;  Surgeon: Sherrilyn Rist, MD;  Location: [facility];  Service: Gastroenterology;;   COLONOSCOPY N/A 03/11/2014   Procedure: COLONOSCOPY;  Surgeon: Malissa Hippo, MD;  Location: [facility];  Service: Endoscopy;  Laterality: N/A;  730 - moved to 1/13 @ 12:00   COLONOSCOPY     ESOPHAGOGASTRODUODENOSCOPY (EGD) WITH PROPOFOL N/A 09/20/2020   Procedure: ESOPHAGOGASTRODUODENOSCOPY (EGD) WITH PROPOFOL;  Surgeon: Sherrilyn Rist, MD;  Location: [facility];  Service: Gastroenterology;  Laterality: N/A;   GASTRIC BYPASS  05/2021   PARTIAL HYSTERECTOMY     POLYPECTOMY     REVERSE SHOULDER ARTHROPLASTY Right 05/27/2020   Procedure: REVERSE SHOULDER ARTHROPLASTY;  Surgeon: Jones Broom, MD;  Location: [facility];  Service: Orthopedics;  Laterality: Right;   TOTAL KNEE ARTHROPLASTY Left 03/24/2022   Procedure: TOTAL KNEE ARTHROPLASTY;  Surgeon: Beverely Low, MD;  Location: [facility];  Service: Orthopedics;  Laterality: Left;  spinal and general   tube insert in right ear  02/27/2001   UPPER GI ENDOSCOPY N/A 05/30/2021   Procedure: UPPER  GI ENDOSCOPY;  Surgeon: Quentin Ore, MD;  Location: [facility];  Service: General;  Laterality: Bertram Denver, MD 10/25/22 1650    Benjiman Core, MD 10/25/22 1706

## 2022-10-25 NOTE — ED Triage Notes (Signed)
Pt c/o abd pain and generalized weakness since Monday.

## 2022-10-26 DIAGNOSIS — J189 Pneumonia, unspecified organism: Secondary | ICD-10-CM

## 2022-10-26 DIAGNOSIS — K219 Gastro-esophageal reflux disease without esophagitis: Secondary | ICD-10-CM

## 2022-10-26 DIAGNOSIS — J45909 Unspecified asthma, uncomplicated: Secondary | ICD-10-CM | POA: Diagnosis not present

## 2022-10-26 DIAGNOSIS — K56609 Unspecified intestinal obstruction, unspecified as to partial versus complete obstruction: Secondary | ICD-10-CM | POA: Diagnosis not present

## 2022-10-26 DIAGNOSIS — A419 Sepsis, unspecified organism: Principal | ICD-10-CM

## 2022-10-26 LAB — BASIC METABOLIC PANEL
Anion gap: 7 (ref 5–15)
BUN: 28 mg/dL — ABNORMAL HIGH (ref 6–20)
CO2: 27 mmol/L (ref 22–32)
Calcium: 8.6 mg/dL — ABNORMAL LOW (ref 8.9–10.3)
Chloride: 102 mmol/L (ref 98–111)
Creatinine, Ser: 1.13 mg/dL — ABNORMAL HIGH (ref 0.44–1.00)
GFR, Estimated: 57 mL/min — ABNORMAL LOW (ref >60)
Glucose, Bld: 76 mg/dL (ref 70–99)
Potassium: 3.9 mmol/L (ref 3.5–5.1)
Sodium: 136 mmol/L (ref 135–145)

## 2022-10-26 LAB — PHOSPHORUS: Phosphorus: 3.7 mg/dL (ref 2.5–4.6)

## 2022-10-26 LAB — CBC
HCT: 35.8 % — ABNORMAL LOW (ref 36.0–46.0)
Hemoglobin: 11.4 g/dL — ABNORMAL LOW (ref 12.0–15.0)
MCH: 29 pg (ref 26.0–34.0)
MCHC: 31.8 g/dL (ref 30.0–36.0)
MCV: 91.1 fL (ref 80.0–100.0)
Platelets: 222 10*3/uL (ref 150–400)
RBC: 3.93 MIL/uL (ref 3.87–5.11)
RDW: 15.4 % (ref 11.5–15.5)
WBC: 16.1 10*3/uL — ABNORMAL HIGH (ref 4.0–10.5)
nRBC: 0 % (ref 0.0–0.2)

## 2022-10-26 LAB — HIV ANTIBODY (ROUTINE TESTING W REFLEX): HIV Screen 4th Generation wRfx: NONREACTIVE

## 2022-10-26 MED ORDER — HYDROCORTISONE 1 % EX CREA
TOPICAL_CREAM | Freq: Two times a day (BID) | CUTANEOUS | Status: DC
  Filled 2022-10-26: qty 28

## 2022-10-26 MED ORDER — ACETAMINOPHEN 325 MG PO TABS
650.0000 mg | ORAL_TABLET | Freq: Four times a day (QID) | ORAL | Status: DC | PRN
  Administered 2022-10-26 – 2022-10-28 (×2): 650 mg via ORAL
  Filled 2022-10-26 (×2): qty 2

## 2022-10-26 MED ORDER — LACTATED RINGERS IV SOLN: INTRAVENOUS | Status: AC

## 2022-10-26 NOTE — Progress Notes (Signed)
Called to ED for pts glasses.. that where left.. ED stated "that they will bring up pt's shoes and glasses when they bring room 38 to unit."

## 2022-10-26 NOTE — Plan of Care (Signed)

## 2022-10-26 NOTE — Consult Note (Signed)
Reason for Consult:Possible SBO vs. Ileus Referring Physician: Vassie Loll, MD  Jessica Stevens is an 57 y.o. female.  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 She reports that she had no dietary changes or identifiable cause of onset of abdominal pain on Monday. She took an enema on Tuesday and had non bloody, non tarry diarrhea. She has had some diarrhea since then. Abdominal pain worsened prompting her to come to ED yesterday. Endorses nausea but no vomiting. She has had 150 lbs of weight loss since her gastric bypass. Pain has now improved some and she is still having small volume loose stools and is passing gas.  Past Medical History:  Diagnosis Date   Allergy    Anemia    Ankle pain, left    Anxiety    Arthritis of knee, degenerative    Asthma    Carpal tunnel syndrome    Cervical radiculopathy    Chronic back pain    Complete rupture of rotator cuff    Complete rupture of rotator cuff    Depression    GERD (gastroesophageal reflux disease)    Gynecomastia    HNP (herniated nucleus pulposus), cervical    HNP (herniated nucleus pulposus), cervical    Hyperlipidemia    Hypertension    Hypothyroidism    Knee pain    OA (osteoarthritis) of knee    Obesity    Sleep apnea    hx of    Urinary incontinence     Past Surgical History:  Procedure Laterality Date   arthroscopy  left knee  08/14/2008   Dr. Romeo Apple    BIOPSY  09/20/2020   Procedure: BIOPSY;  Surgeon: Sherrilyn Rist, MD;  Location: [facility];  Service: Gastroenterology;;   COLONOSCOPY N/A 03/11/2014   Procedure: COLONOSCOPY;  Surgeon: Malissa Hippo, MD;  [facility]: [facility];  Service: Endoscopy;  Laterality: N/A;  730 - moved to 1/13 @ 12:00   COLONOSCOPY     ESOPHAGOGASTRODUODENOSCOPY (EGD) WITH PROPOFOL N/A 09/20/2020   Procedure: ESOPHAGOGASTRODUODENOSCOPY (EGD) WITH PROPOFOL;  Surgeon: Sherrilyn Rist, MD;  Location:  [facility];  Service: Gastroenterology;  Laterality: N/A;   GASTRIC BYPASS  05/2021   PARTIAL HYSTERECTOMY     POLYPECTOMY     REVERSE SHOULDER ARTHROPLASTY Right 05/27/2020   Procedure: REVERSE SHOULDER ARTHROPLASTY;  Surgeon: Jones Broom, MD;  Location: [facility];  Service: Orthopedics;  Laterality: Right;   TOTAL KNEE ARTHROPLASTY Left 03/24/2022   Procedure: TOTAL KNEE ARTHROPLASTY;  Surgeon: Beverely Low, MD;  Location: [facility];  Service: Orthopedics;  Laterality: Left;  spinal and general   tube insert in right ear  02/27/2001   UPPER GI ENDOSCOPY N/A 05/30/2021   Procedure: UPPER GI ENDOSCOPY;  Surgeon: Quentin Ore, MD;  Location: [facility];  Service: General;  Laterality: N/A;    Family History  Problem Relation Age of Onset   Diabetes Mother    Hypertension Mother    Cancer Father        left nephrectomy    Hypertension Father    Hypertension Sister    Diabetes Maternal Grandmother    Hypertension Maternal Grandmother    Hypertension Maternal Grandfather    Colon cancer Neg Hx    Colon polyps Neg Hx    Esophageal cancer Neg Hx    Rectal cancer Neg Hx    Stomach cancer Neg Hx  Social History:  reports that she has never smoked. She has never used smokeless tobacco. She reports that she does not currently use alcohol. She reports current drug use. Drug: Marijuana.  Allergies:  Allergies  Allergen Reactions   Oxycodone-Acetaminophen Rash    Not allergic to tylenol    Medications: Prior to Admission:  Medications Prior to Admission  Medication Sig Dispense Refill Last Dose   amLODipine (NORVASC) 5 MG tablet Take 1 tablet by mouth daily.   10/19/2022   busPIRone (BUSPAR) 5 MG tablet Take 5 mg by mouth 2 (two) times daily as needed (stress).   10/19/2022   clonazePAM (KLONOPIN) 0.5 MG tablet Take 0.5 mg by mouth 2 (two) times daily as needed for anxiety.   10/19/2022   cyanocobalamin (VITAMIN B12) 1000 MCG/ML injection Inject 1,000 mcg into the muscle every  30 (thirty) days.   10/17/2022   diclofenac Sodium (VOLTAREN) 1 % GEL Apply 2 g topically daily as needed (pain).   Past Week   furosemide (LASIX) 20 MG tablet Take 20 mg by mouth every morning.   10/19/2022   hydrALAZINE (APRESOLINE) 25 MG tablet Take 25 mg by mouth 2 (two) times daily.   10/19/2022   HYDROcodone-acetaminophen (NORCO) 10-325 MG tablet Take 1 tablet by mouth 3 (three) times daily as needed for moderate pain.   10/23/2022   HYDROmorphone (DILAUDID) 2 MG tablet Take 1 tablet (2 mg total) by mouth every 4 (four) hours as needed for severe pain (for severe pain only and do not take with Hydrocodone). 40 tablet 0 10/19/2022   levocetirizine (XYZAL) 5 MG tablet Take 5 mg by mouth every evening.   10/19/2022   levothyroxine (SYNTHROID) 112 MCG tablet Take 112 mcg by mouth daily before breakfast.   10/19/2022   losartan (COZAAR) 50 MG tablet Take 1 tablet by mouth daily.   10/19/2022   meloxicam (MOBIC) 15 MG tablet Take 15 mg by mouth daily.   Past Month   Multiple Vitamins-Minerals (AIRBORNE PO) Take 1 tablet by mouth in the morning and at bedtime.   10/19/2022   Multiple Vitamins-Minerals (HAIR SKIN & NAILS) TABS Take 1 tablet by mouth daily.   10/19/2022   ondansetron (ZOFRAN) 4 MG tablet Take 1 tablet (4 mg total) by mouth every 8 (eight) hours as needed for nausea, vomiting or refractory nausea / vomiting. 30 tablet 1 10/19/2022   pantoprazole (PROTONIX) 40 MG tablet Take 1 tablet (40 mg total) by mouth daily. 90 tablet 0 10/19/2022   potassium chloride (MICRO-K) 10 MEQ CR capsule Take 10 mEq by mouth daily.   10/19/2022   rosuvastatin (CRESTOR) 20 MG tablet Take 20 mg by mouth daily.   10/19/2022   SYMBICORT 80-4.5 MCG/ACT inhaler Inhale 2 puffs into the lungs 2 (two) times daily.   10/22/2022   tiZANidine (ZANAFLEX) 2 MG tablet Take 2 mg by mouth 3 (three) times daily as needed.   10/19/2022    Results for orders placed or performed during the hospital encounter of 10/25/22 (from the past 48  hour(s))  SARS Coronavirus 2 by RT PCR (hospital order, performed in [facility] hospital lab) *cepheid single result test* Anterior Nasal Swab     Status: None   Collection Time: 10/25/22  1:00 PM   Specimen: Anterior Nasal Swab  Result Value Ref Range   SARS Coronavirus 2 by RT PCR NEGATIVE NEGATIVE    Comment: (NOTE) SARS-CoV-2 target nucleic acids are NOT DETECTED.  The SARS-CoV-2 RNA is generally detectable in  upper and lower respiratory specimens during the acute phase of infection. The lowest concentration of SARS-CoV-2 viral copies this assay can detect is 250 copies / mL. A negative result does not preclude SARS-CoV-2 infection and should not be used as the sole basis for treatment or other patient management decisions.  A negative result may occur with improper specimen collection / handling, submission of specimen other than nasopharyngeal swab, presence of viral mutation(s) within the areas targeted by this assay, and inadequate number of viral copies (<250 copies / mL). A negative result must be combined with clinical observations, patient history, and epidemiological information.  Fact Sheet for Patients:   RoadLapTop.co.za  Fact Sheet for Healthcare Providers: http://kim-miller.com/  This test is not yet approved or  cleared by the [place] FDA and has been authorized for detection and/or diagnosis of SARS-CoV-2 by FDA under an Emergency Use Authorization (EUA).  This EUA will remain in effect (meaning this test can be used) for the duration of the COVID-19 declaration under Section 564(b)(1) of the Act, 21 U.S.C. section 360bbb-3(b)(1), unless the authorization is terminated or revoked sooner.  Performed at [facility], [address] [place], [place] [address]   Comprehensive metabolic panel     Status: Abnormal   Collection Time: 10/25/22  1:05 PM  Result Value Ref Range   Sodium 135 135 - 145 mmol/L    Potassium 4.4 3.5 - 5.1 mmol/L   Chloride 96 (L) 98 - 111 mmol/L   CO2 28 22 - 32 mmol/L   Glucose, Bld 104 (H) 70 - 99 mg/dL    Comment: Glucose reference range applies only to samples taken after fasting for at least 8 hours.   BUN 33 (H) 6 - 20 mg/dL   Creatinine, Ser 6.04 (H) 0.44 - 1.00 mg/dL   Calcium 8.7 (L) 8.9 - 10.3 mg/dL   Total Protein 6.7 6.5 - 8.1 g/dL   Albumin 3.3 (L) 3.5 - 5.0 g/dL   AST 32 15 - 41 U/L   ALT 20 0 - 44 U/L   Alkaline Phosphatase 82 38 - 126 U/L   Total Bilirubin 0.5 0.3 - 1.2 mg/dL   GFR, Estimated 54 (L) >60 mL/min    Comment: (NOTE) Calculated using the CKD-EPI Creatinine Equation (2021)    Anion gap 11 5 - 15    Comment: Performed at [facility], [address], [place], [place] [address]  Troponin I (High Sensitivity)     Status: Abnormal   Collection Time: 10/25/22  1:05 PM  Result Value Ref Range   Troponin I (High Sensitivity) 24 (H) <18 ng/L    Comment: (NOTE) Elevated high sensitivity troponin I (hsTnI) values and significant  changes across serial measurements may suggest ACS but many other  chronic and acute conditions are known to elevate hsTnI results.  Refer to the "Links" section for chest pain algorithms and additional  guidance. Performed at [facility], [address] [place], [place] [address]   Lipase, blood     Status: None   Collection Time: 10/25/22  1:05 PM  Result Value Ref Range   Lipase 23 11 - 51 U/L    Comment: Performed at [facility], [address] [place], [place] [address]  CBC with Differential     Status: Abnormal   Collection Time: 10/25/22  1:05 PM  Result Value Ref Range   WBC 12.7 (H) 4.0 - 10.5 K/uL   RBC 4.52 3.87 - 5.11 MIL/uL   Hemoglobin 13.2 12.0 -  15.0 g/dL   HCT 40.9 81.1 - 91.4 %   MCV 87.6 80.0 - 100.0 fL   MCH 29.2 26.0 - 34.0 pg   MCHC 33.3 30.0 - 36.0 g/dL   RDW 78.2 95.6 - 21.3 %   Platelets 227 150 - 400 K/uL   nRBC 0.0 0.0 - 0.2 %   Neutrophils Relative % 50 %   Neutro Abs 9.1  (H) 1.7 - 7.7 K/uL   Band Neutrophils 22 %   Lymphocytes Relative 18 %   Lymphs Abs 2.3 0.7 - 4.0 K/uL   Monocytes Relative 3 %   Monocytes Absolute 0.4 0.1 - 1.0 K/uL   Eosinophils Relative 0 %   Eosinophils Absolute 0.0 0.0 - 0.5 K/uL   Basophils Relative 0 %   Basophils Absolute 0.0 0.0 - 0.1 K/uL   WBC Morphology Moderate Left Shift (>5% metas and myelos)    RBC Morphology MORPHOLOGY UNREMARKABLE    Smear Review MORPHOLOGY UNREMARKABLE    Metamyelocytes Relative 6 %   Myelocytes 1 %   Abs Immature Granulocytes 0.90 (H) 0.00 - 0.07 K/uL    Comment: Performed at [facility], [address] [place], [place] [address]  Phosphorus     Status: None   Collection Time: 10/25/22  1:05 PM  Result Value Ref Range   Phosphorus 4.0 2.5 - 4.6 mg/dL    Comment: Performed at [facility], [address] [place], [place] [address]  Culture, blood (routine x 2)     Status: None (Preliminary result)   Collection Time: 10/25/22  2:02 PM   Specimen: BLOOD  Result Value Ref Range   Specimen Description BLOOD BLOOD LEFT HAND    Special Requests      BOTTLES DRAWN AEROBIC ONLY Blood Culture results may not be optimal due to an inadequate volume of blood received in culture bottles   Culture      NO GROWTH < 24 HOURS Performed at [facility], [address], [place], [place] [address]    Report Status PENDING   Culture, blood (routine x 2)     Status: None (Preliminary result)   Collection Time: 10/25/22  2:02 PM   Specimen: BLOOD  Result Value Ref Range   Specimen Description BLOOD BLOOD RIGHT HAND    Special Requests      BOTTLES DRAWN AEROBIC ONLY Blood Culture results may not be optimal due to an inadequate volume of blood received in culture bottles   Culture      NO GROWTH < 24 HOURS Performed at [facility], [address] [place], [place] [address]    Report Status PENDING   Lactic acid, plasma     Status: Abnormal   Collection Time: 10/25/22  2:02 PM  Result Value Ref Range    Lactic Acid, Venous 2.7 (HH) 0.5 - 1.9 mmol/L    Comment: CRITICAL RESULT CALLED TO, READ BACK BY AND VERIFIED WITH EDWARDS,C ON 10/25/22 AT 1500 BY LOY,C Performed at [facility], [address], [place], [place] [address]   Lactic acid, plasma     Status: None   Collection Time: 10/25/22  4:21 PM  Result Value Ref Range   Lactic Acid, Venous 1.7 0.5 - 1.9 mmol/L    Comment: Performed at [facility], [address] [place], [place] [address]  Urinalysis, w/ Reflex to Culture (Infection Suspected) -Urine, Clean Catch     Status: Abnormal   Collection Time: 10/25/22  5:02 PM  Result Value Ref Range  Specimen Source URINE, CLEAN CATCH    Color, Urine YELLOW YELLOW   APPearance CLEAR CLEAR   Specific Gravity, Urine 1.032 (H) 1.005 - 1.030   pH 7.0 5.0 - 8.0   Glucose, UA NEGATIVE NEGATIVE mg/dL   Hgb urine dipstick NEGATIVE NEGATIVE   Bilirubin Urine NEGATIVE NEGATIVE   Ketones, ur NEGATIVE NEGATIVE mg/dL   Protein, ur 30 (A) NEGATIVE mg/dL   Nitrite NEGATIVE NEGATIVE   Leukocytes,Ua NEGATIVE NEGATIVE   RBC / HPF 0-5 0 - 5 RBC/hpf   WBC, UA 0-5 0 - 5 WBC/hpf    Comment:        Reflex urine culture not performed if WBC <=10, OR if Squamous epithelial cells >5. If Squamous epithelial cells >5 suggest recollection.    Bacteria, UA NONE SEEN NONE SEEN   Squamous Epithelial / HPF 0-5 0 - 5 /HPF    Comment: Performed at [facility], [address], [place], [place] [address]  Phosphorus     Status: None   Collection Time: 10/26/22  4:19 AM  Result Value Ref Range   Phosphorus 3.7 2.5 - 4.6 mg/dL    Comment: Performed at [facility], [address] [place], [place] [address]  HIV Antibody (routine testing w rflx)     Status: None   Collection Time: 10/26/22  4:19 AM  Result Value Ref Range   HIV Screen 4th Generation wRfx Non Reactive Non Reactive    Comment: Performed at [facility], [address], [place], [place] [address]  Basic metabolic panel     Status: Abnormal    Collection Time: 10/26/22  4:19 AM  Result Value Ref Range   Sodium 136 135 - 145 mmol/L   Potassium 3.9 3.5 - 5.1 mmol/L   Chloride 102 98 - 111 mmol/L   CO2 27 22 - 32 mmol/L   Glucose, Bld 76 70 - 99 mg/dL    Comment: Glucose reference range applies only to samples taken after fasting for at least 8 hours.   BUN 28 (H) 6 - 20 mg/dL   Creatinine, Ser 1.09 (H) 0.44 - 1.00 mg/dL   Calcium 8.6 (L) 8.9 - 10.3 mg/dL   GFR, Estimated 57 (L) >60 mL/min    Comment: (NOTE) Calculated using the CKD-EPI Creatinine Equation (2021)    Anion gap 7 5 - 15    Comment: Performed at [facility], [address] [place], [place] [address]  CBC     Status: Abnormal   Collection Time: 10/26/22  4:19 AM  Result Value Ref Range   WBC 16.1 (H) 4.0 - 10.5 K/uL   RBC 3.93 3.87 - 5.11 MIL/uL   Hemoglobin 11.4 (L) 12.0 - 15.0 g/dL   HCT 73.2 (L) 20.2 - 54.2 %   MCV 91.1 80.0 - 100.0 fL   MCH 29.0 26.0 - 34.0 pg   MCHC 31.8 30.0 - 36.0 g/dL   RDW 70.6 23.7 - 62.8 %   Platelets 222 150 - 400 K/uL   nRBC 0.0 0.0 - 0.2 %    Comment: Performed at [facility], [address] [place], [place] [address]    CT ABDOMEN PELVIS W CONTRAST  Result Date: 10/25/2022 CLINICAL DATA:  Day history of left lower quadrant abdominal pain and generalized weakness EXAM: CT ABDOMEN AND PELVIS WITH CONTRAST TECHNIQUE: Multidetector CT imaging of the abdomen and pelvis was performed using the standard protocol following bolus administration of intravenous contrast. RADIATION DOSE REDUCTION: This exam was performed according  to the departmental dose-optimization program which includes automated exposure control, adjustment of the mA and/or kV according to patient size and/or use of iterative reconstruction technique. CONTRAST:  OMNIPAQUE IOHEXOL 300 MG/ML  SOLN COMPARISON:  CT abdomen and pelvis dated 06/27/2021 FINDINGS: Lower chest: Multifocal patchy ground-glass and peripheral consolidative opacities. No pleural effusion or  pneumothorax demonstrated. Partially imaged heart size is normal. Hepatobiliary: No focal hepatic lesions. No intra or extrahepatic biliary ductal dilation. Normal gallbladder. Pancreas: No focal lesions or main ductal dilation. Spleen: Normal in size without focal abnormality. Adrenals/Urinary Tract: No adrenal nodules. No suspicious renal mass, calculi or hydronephrosis. No focal bladder wall thickening. Stomach/Bowel: Postsurgical changes from bypass. Diffuse small bowel dilation with multiple loops demonstrating caliber narrowing centered in the right lower quadrant mesentery. Colonic diverticulosis without acute diverticulitis. Normal appendix. Vascular/Lymphatic: Aortic atherosclerosis. No enlarged abdominal or pelvic lymph nodes. Reproductive: No adnexal masses. Other: Small volume ascites.  No free air or fluid collection. Musculoskeletal: No acute or abnormal lytic or blastic osseous lesions. Multilevel degenerative changes of the partially imaged thoracic and lumbar spine. Unchanged grade 1 anterolisthesis at L3-4 and L4-5 with grade 1 retrolisthesis at L5-S1. Similar vertebral body height loss of L5. Mild body wall edema. IMPRESSION: 1. Diffuse small bowel dilation with multiple loops demonstrating caliber narrowing centered in the right lower quadrant mesentery, suspicious for ileus versus partial small bowel obstruction. 2. Multifocal patchy ground-glass and peripheral consolidative opacities in the lung bases, suspicious for multifocal pneumonia. 3. Small volume ascites. 4. Colonic diverticulosis without acute diverticulitis. 5. Aortic Atherosclerosis (ICD10-I70.0). Electronically Signed   By: Agustin Cree M.D.   On: 10/25/2022 15:40   DG Chest Portable 1 View  Result Date: 10/25/2022 CLINICAL DATA:  Weakness with chest and abdominal pain. EXAM: PORTABLE CHEST 1 VIEW COMPARISON:  10/28/2020 FINDINGS: Asymmetric airspace disease is mid and lower lung predominant, left greater than right. No pulmonary  edema or pleural effusion. The cardio pericardial silhouette is enlarged. Status post right shoulder replacement. Degenerative changes noted left shoulder. IMPRESSION: Asymmetric airspace disease, left greater than right. Imaging features suggest multifocal pneumonia. The bilateral lung opacity has a somewhat nodular character and close follow-up recommended to exclude metastatic disease. Electronically Signed   By: Kennith Center M.D.   On: 10/25/2022 13:19    ROS:  Pertinent items are noted in HPI.  Blood pressure 127/75, pulse 94, temperature 99.1 F (37.3 C), temperature source Oral, resp. rate 20, height 5\' 5"  (1.651 m), weight 69 kg, SpO2 93%. Physical Exam:  Gen: well appearing, in no acute distress CV: RRR, no MRG Pulm: normal WOB, clear to auscultation bilaterally  Assessment/Plan: Abdominal Pain Patient likely has an SBO vs. Ileus. She is still passing stool and gas and pain has improved. No acute surgical intervention needed at this time. Treat conservatively with bowel rest and IVF while controlling pain. Can advance diet as tolerated, starting on clear liquids now. Unable to place NG tube given Roux en Y history.  Jessica Stevens 10/26/2022, 11:30 AM

## 2022-10-26 NOTE — Plan of Care (Signed)
  Problem: Education: Goal: Knowledge of General Education information will improve Description: Including pain rating scale, medication(s)/side effects and non-pharmacologic comfort measures Outcome: Progressing   Problem: Health Behavior/Discharge Planning: Goal: Ability to manage health-related needs will improve Outcome: Progressing   Problem: Activity: Goal: Risk for activity intolerance will decrease Outcome: Progressing   

## 2022-10-26 NOTE — Progress Notes (Signed)
   10/26/22 1045  TOC Brief Assessment  Insurance and Status Reviewed  Patient has primary care physician Yes  Home environment has been reviewed alone  Prior level of function: independent  Prior/Current Home Services No current home services  Social Determinants of Health Reivew SDOH reviewed no interventions necessary  Readmission risk has been reviewed Yes  Transition of care needs no transition of care needs at this time   [facility] Screen Transition of Care Department [facility]) has reviewed patient and no TOC needs have been identified at this time. We will continue to monitor patient advancement through interdisciplinary progression rounds. If new patient transition needs arise, please place a TOC consult.

## 2022-10-26 NOTE — Progress Notes (Signed)
Pt came up from ED with BM on her. This LPN  provided pt with wash clothes and assisted in getting pt clean. Pt was able to ambulate to bathroom and stand at sink to perform hand hygiene. Pt able to make needs known. Pt lying in bed. Bed alarm on, pt provided slip socks for safety.Pt able to make needs known.

## 2022-10-26 NOTE — Progress Notes (Signed)
Mobility Specialist Progress Note:    10/26/22 1020  Mobility  Activity Ambulated with assistance in hallway;Ambulated with assistance to bathroom  Level of Assistance Contact guard assist, steadying assist  Assistive Device None  Distance Ambulated (ft) 140 ft  Range of Motion/Exercises Active;All extremities  Activity Response Tolerated well  Mobility Referral Yes  $Mobility charge 1 Mobility  Mobility Specialist Start Time (ACUTE ONLY) 1020  Mobility Specialist Stop Time (ACUTE ONLY) 1030  Mobility Specialist Time Calculation (min) (ACUTE ONLY) 10 min   Pt received in bed, agreeable to mobility session. Required CGA to stand and ambulate with no AD. Tolerated well, asx throughout. Returned pt to room, all needs met.   Lawerance Bach Mobility Specialist Please contact via Special educational needs teacher or  Rehab office at [phone]

## 2022-10-26 NOTE — Progress Notes (Signed)
Progress Note   Patient: Jessica Stevens:[id number] DOB: 07-27-1965 DOA: 10/25/2022     1 DOS: the patient was seen and examined on 10/26/2022   Brief hospital admission narrative : As per H&P written by Dr. Randol Kern on 10/25/2022 Jessica Stevens  is a 57 y.o. 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  Patient reports she difficulty hearing as there is a piece of left ear hearing aid stuck in her ear canal since Monday (was removed by ED)-  - in ED patient was noted to be tachycardic, afebrile, white blood cell count elevated at 12.7, lactic acid elevated at 2.7 improved with IV fluids to 1.7, imaging significant for ileus versus SBO, and multifocal pneumonia, creatinine elevated at 1.18, discussed with Dr. Lovell Sheehan, who requested to admit to any pain hospital by [facility], he will evaluate in a.m., [facility] hospitalist consulted to admit  Assessment and Plan: 1-small bowel obstruction versus ileus -Patient with history of Roux-en-Y surgery of 2023 -Reporting some diarrhea prior to admission -Currently without any further episodes of vomiting.  Still intermittently nauseated but feeling better -Patient expressed passing gas and had small episode of diarrhea overnight. -Follow recommendation by general surgery plan is to advance to clear liquid diet -Continue to maintain adequate hydration, electrolyte repletion and  as needed antiemetics -Patient has been encouraged to increase physical activity. -No surgical intervention needed at the moment.  2-sepsis secondary to pneumonia -Patient met criteria for sepsis with leukocytosis, lactic acidosis, tachycardia and tachypnea -Chest x-ray demonstrating infiltrates with concern for aspiration pneumonia -Continue IV Zosyn -Continue the use of incentive spirometer/flutter valve and mucolytic's. -No oxygen required -Continue to maintain adequate hydration and follow clinical response. -Lactic acid within normal limits currently.  4-gastroesophageal flux disease -Continue PPI  5-history of asthma -No wheezing appreciated on exam -Continue as needed bronchodilator.  6-status post gastric bypass -Patient with over 150 pound weight loss since surgery -Continue thiamine, B12, adequate hydration and supportive care.  Subjective:  Reported intermittent nausea, no further vomiting, breathing easier and demonstrating good saturation on room air.  Patient is afebrile.  Physical Exam: Vitals:   10/26/22 0100 10/26/22 0333 10/26/22 0341 10/26/22 0805  BP: (!) 160/74  (!) 111/92 127/75  Pulse: 98  91 94  Resp: 18  18 20   Temp: 98.8 F (37.1 C)  99.3 F (37.4 C) 99.1 F (37.3 C)  TempSrc:   Oral Oral  SpO2: 93%  93%   Weight:  69 kg    Height:  5\' 5"  (1.651 m)     General exam: Alert, awake, oriented x 3; reports feeling better; no further episodes of vomiting.  Still intermittent nauseated.  Mild abdominal discomfort reported. Respiratory system: Positive scattered rhonchi bilaterally; no using accessory muscle.  Good saturation on room air. Cardiovascular system:RRR. No rubs or gallops; no JVD. Gastrointestinal system: Abdomen is nondistended, soft and mildly tender to palpation (vaguely).  Decreased but present bowel sounds on examination. Central nervous system: Alert and oriented. No focal neurological deficits.  Extremities: No cyanosis or  clubbing. Skin: No petechiae. Psychiatry: Judgement and insight appear normal. Mood & affect appropriate.   Data Reviewed: Phosphorus: 3.7 Basic metabolic panel: Sodium 136, potassium 3.9, chloride 102, bicarb 27, BUN 28, creatinine 1.13 and GFR 57 CBC: WBC 16.1, hemoglobin 11.4, platelet count 222 K  Family Communication: No family at bedside.  Disposition: Status is: Inpatient Remains inpatient appropriate because: Continue IV antibiotics, fluids resuscitation, electrolyte repletion and slowly advance diet.   Planned Discharge Destination: Home  Time spent: 40 minutes  Author: Vassie Loll, MD 10/26/2022 6:30 PM  For on call review [email or web address].

## 2022-10-27 DIAGNOSIS — K219 Gastro-esophageal reflux disease without esophagitis: Secondary | ICD-10-CM | POA: Diagnosis not present

## 2022-10-27 DIAGNOSIS — J45909 Unspecified asthma, uncomplicated: Secondary | ICD-10-CM | POA: Diagnosis not present

## 2022-10-27 DIAGNOSIS — K56609 Unspecified intestinal obstruction, unspecified as to partial versus complete obstruction: Secondary | ICD-10-CM | POA: Diagnosis not present

## 2022-10-27 DIAGNOSIS — J189 Pneumonia, unspecified organism: Secondary | ICD-10-CM | POA: Diagnosis not present

## 2022-10-27 LAB — BASIC METABOLIC PANEL
Anion gap: 4 — ABNORMAL LOW (ref 5–15)
BUN: 14 mg/dL (ref 6–20)
CO2: 28 mmol/L (ref 22–32)
Calcium: 8.5 mg/dL — ABNORMAL LOW (ref 8.9–10.3)
Chloride: 104 mmol/L (ref 98–111)
Creatinine, Ser: 1.01 mg/dL — ABNORMAL HIGH (ref 0.44–1.00)
GFR, Estimated: >60 mL/min (ref >60)
Glucose, Bld: 95 mg/dL (ref 70–99)
Potassium: 4.1 mmol/L (ref 3.5–5.1)
Sodium: 136 mmol/L (ref 135–145)

## 2022-10-27 LAB — CBC
HCT: 30.2 % — ABNORMAL LOW (ref 36.0–46.0)
Hemoglobin: 9.7 g/dL — ABNORMAL LOW (ref 12.0–15.0)
MCH: 28.9 pg (ref 26.0–34.0)
MCHC: 32.1 g/dL (ref 30.0–36.0)
MCV: 89.9 fL (ref 80.0–100.0)
Platelets: 192 10*3/uL (ref 150–400)
RBC: 3.36 MIL/uL — ABNORMAL LOW (ref 3.87–5.11)
RDW: 15.3 % (ref 11.5–15.5)
WBC: 20.3 10*3/uL — ABNORMAL HIGH (ref 4.0–10.5)
nRBC: 0 % (ref 0.0–0.2)

## 2022-10-27 NOTE — Care Management Important Message (Signed)
Important Message  Patient Details  Name: Jessica Stevens MRN: [id number] Date of Birth: [birth year]   Medicare Important Message Given:  Yes     Corey Harold 10/27/2022, 11:04 AM

## 2022-10-27 NOTE — Progress Notes (Signed)
Mobility Specialist Progress Note:    10/27/22 0945  Mobility  Activity Ambulated with assistance in hallway  Level of Assistance Modified independent, requires aide device or extra time  Assistive Device Other (Comment) (IVpole, handrails)  Distance Ambulated (ft) 250 ft  Range of Motion/Exercises Active;All extremities  Activity Response Tolerated well  Mobility Referral Yes  $Mobility charge 1 Mobility  Mobility Specialist Start Time (ACUTE ONLY) 0945  Mobility Specialist Stop Time (ACUTE ONLY) 1000  Mobility Specialist Time Calculation (min) (ACUTE ONLY) 15 min   Pt received in bed, eager for mobility session. Required ModI and CGA to stand and ambulate with Iv pole/handrails. Tolerated well, audible SOB at EOS. Returned pt to room, left in chair. Call bell in reach, all needs met.   Lawerance Bach Mobility Specialist Please contact via Special educational needs teacher or  Rehab office at [phone]

## 2022-10-27 NOTE — Plan of Care (Signed)
°  Problem: Education: °Goal: Knowledge of General Education information will improve °Description: Including pain rating scale, medication(s)/side effects and non-pharmacologic comfort measures °Outcome: Progressing °  °Problem: Health Behavior/Discharge Planning: °Goal: Ability to manage health-related needs will improve °Outcome: Progressing °  °Problem: Clinical Measurements: °Goal: Ability to maintain clinical measurements within normal limits will improve °Outcome: Progressing °Goal: Will remain free from infection °Outcome: Progressing °Goal: Cardiovascular complication will be avoided °Outcome: Progressing °  °Problem: Activity: °Goal: Risk for activity intolerance will decrease °Outcome: Progressing °  °Problem: Nutrition: °Goal: Adequate nutrition will be maintained °Outcome: Progressing °  °

## 2022-10-27 NOTE — Progress Notes (Signed)
Progress Note   Patient: Jessica Stevens UJW:[id number] DOB: Sep 04, 1965 DOA: 10/25/2022     2 DOS: the patient was seen and examined on 10/27/2022   Brief hospital admission narrative : As per H&P written by Dr. Randol Kern on 10/25/2022 Jessica Stevens  is a 57 y.o. 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  Patient reports she difficulty hearing as there is a piece of left ear hearing aid stuck in her ear canal since Monday (was removed by ED)-  - in ED patient was noted to be tachycardic, afebrile, white blood cell count elevated at 12.7, lactic acid elevated at 2.7 improved with IV fluids to 1.7, imaging significant for ileus versus SBO, and multifocal pneumonia, creatinine elevated at 1.18, discussed with Dr. Lovell Sheehan, who requested to admit to any pain hospital by [facility], he will evaluate in a.m., [facility] hospitalist consulted to admit  Assessment and Plan: 1-small bowel obstruction versus ileus -Patient with history of Roux-en-Y surgery of 2023 -Reporting some diarrhea prior to admission -Currently without any further episodes of vomiting.  Still intermittently nauseated but feeling better -Patient expressed passing gas and had small episode of diarrhea overnight. -Appreciate assistance and recommendation by general surgery; will advance diet to soft if tolerated patient will be able to go home on  10/28/2022. -Continue to maintain adequate hydration, electrolyte repletion and as needed antiemetics -Patient has been encouraged to increase physical activity. -No surgical intervention needed at the moment.  2-sepsis secondary to pneumonia -Patient met criteria for sepsis with leukocytosis, lactic acidosis, tachycardia and tachypnea -Chest x-ray demonstrating infiltrates with concern for aspiration pneumonia -Continue IV Zosyn -Continue the use of incentive spirometer/flutter valve and mucolytic's. -No oxygen required -Continue to maintain adequate hydration and follow clinical response. -Lactic acid within normal limits after fluid resuscitation.. -Sepsis features essentially resolved; only WBCs appears to be still elevated.  4-gastroesophageal flux disease -Continue PPI  5-history of asthma -No wheezing appreciated on exam -Continue as needed bronchodilator.  6-status post gastric bypass -Patient with over 150 pound weight loss since surgery -Continue thiamine, B12, adequate hydration and supportive care.  Subjective:  No fever, no chest pain, no nausea or vomiting.  Tolerating clear liquid diet.  Good saturation on room air.  Overall feeling better.  Physical Exam: Vitals:   10/26/22 0341 10/26/22 0805 10/26/22 1939 10/27/22 0542  BP: (!) 111/92 127/75 135/83 (!) 140/73  Pulse: 91 94 87 85  Resp: 18 20 18 18   Temp: 99.3 F (37.4 C) 99.1 F (37.3 C) 99 F (37.2 C) 99.7 F (37.6 C)  TempSrc: Oral Oral    SpO2: 93%  95% 93%  Weight:      Height:       General exam: Alert, awake, oriented x 3;   No fever, no chest pain, reports no abdominal pain.  No nausea or vomiting currently.  Feeling better and has tolerated liquid diet.  Good saturation on room air Respiratory system: Scattered rhonchi are appreciated on exam; no using accessory muscle.  Good saturation on room air Cardiovascular system:RRR. No rubs or gallops; no JVD. Gastrointestinal system: Abdomen is soft,  positive bowel sounds; no guarding. Central nervous system: Alert and oriented. No focal neurological deficits. Extremities: No cyanosis or clubbing. Skin: No petechiae. Psychiatry: Judgement and insight appear normal. Mood & affect appropriate.   Data Reviewed: Phosphorus: 3.7 Basic metabolic panel: Sodium 136, potassium 4.1, chloride 104, bicarb 28, BUN 14, creatinine 1.01 and GFR >60 ZOX:WRUE 20.3, hemoglobin 9.7 and platelet count 1 92K.  Family Communication: No family at bedside.  Disposition: Status is: Inpatient Remains inpatient appropriate because: Continue IV antibiotics, fluids resuscitation, electrolyte repletion and continue advancing diet.   Planned Discharge Destination: Home  Time spent: 40 minutes  Author: Vassie Loll, MD 10/27/2022 1:19 PM  For on call review [email or web address].

## 2022-10-27 NOTE — Progress Notes (Signed)
Subjective: Patient reports that she has no abdominal pain. Denies nausea or vomiting. Continues to have small volume nonbloody loose stools and is still passing gas. Her appetite has improved. Endorses some occasional SOB, but none currently. Main complaint this morning is pain in left superior lateral back, likely musculoskeletal.  Objective: Vital signs in last 24 hours: Temp:  [99 F (37.2 C)-99.7 F (37.6 C)] 99.7 F (37.6 C) (08/30 0542) Pulse Rate:  [85-87] 85 (08/30 0542) Resp:  [18] 18 (08/30 0542) BP: (135-140)/(73-83) 140/73 (08/30 0542) SpO2:  [93 %-95 %] 93 % (08/30 0542) Last BM Date : 10/26/22  Intake/Output from previous day: 08/29 0701 - 08/30 0700 In: 299.9 [P.O.:240; IV Piggyback:59.9] Out: -  Intake/Output this shift: No intake/output data recorded.  Physical Exam Gen: well appearing, in no acute distress CV: RRR, no MRG Pulm: normal WOB, clear to auscultation bilaterally Ab: no tenderness to palpation, soft, no guarding MSK: some tenderness to L superior lateral back  Lab Results:  Recent Labs    10/26/22 0419 10/27/22 0407  WBC 16.1* 20.3*  HGB 11.4* 9.7*  HCT 35.8* 30.2*  PLT 222 192   BMET Recent Labs    10/26/22 0419 10/27/22 0407  NA 136 136  K 3.9 4.1  CL 102 104  CO2 27 28  GLUCOSE 76 95  BUN 28* 14  CREATININE 1.13* 1.01*  CALCIUM 8.6* 8.5*   PT/INR No results for input(s): "LABPROT", "INR" in the last 72 hours.  Studies/Results: CT ABDOMEN PELVIS W CONTRAST  Result Date: 10/25/2022 CLINICAL DATA:  Day history of left lower quadrant abdominal pain and generalized weakness EXAM: CT ABDOMEN AND PELVIS WITH CONTRAST TECHNIQUE: Multidetector CT imaging of the abdomen and pelvis was performed using the standard protocol following bolus administration of intravenous contrast. RADIATION DOSE REDUCTION: This exam was performed according to the departmental dose-optimization program which includes automated exposure control,  adjustment of the mA and/or kV according to patient size and/or use of iterative reconstruction technique. CONTRAST:  OMNIPAQUE IOHEXOL 300 MG/ML  SOLN COMPARISON:  CT abdomen and pelvis dated 06/27/2021 FINDINGS: Lower chest: Multifocal patchy ground-glass and peripheral consolidative opacities. No pleural effusion or pneumothorax demonstrated. Partially imaged heart size is normal. Hepatobiliary: No focal hepatic lesions. No intra or extrahepatic biliary ductal dilation. Normal gallbladder. Pancreas: No focal lesions or main ductal dilation. Spleen: Normal in size without focal abnormality. Adrenals/Urinary Tract: No adrenal nodules. No suspicious renal mass, calculi or hydronephrosis. No focal bladder wall thickening. Stomach/Bowel: Postsurgical changes from bypass. Diffuse small bowel dilation with multiple loops demonstrating caliber narrowing centered in the right lower quadrant mesentery. Colonic diverticulosis without acute diverticulitis. Normal appendix. Vascular/Lymphatic: Aortic atherosclerosis. No enlarged abdominal or pelvic lymph nodes. Reproductive: No adnexal masses. Other: Small volume ascites.  No free air or fluid collection. Musculoskeletal: No acute or abnormal lytic or blastic osseous lesions. Multilevel degenerative changes of the partially imaged thoracic and lumbar spine. Unchanged grade 1 anterolisthesis at L3-4 and L4-5 with grade 1 retrolisthesis at L5-S1. Similar vertebral body height loss of L5. Mild body wall edema. IMPRESSION: 1. Diffuse small bowel dilation with multiple loops demonstrating caliber narrowing centered in the right lower quadrant mesentery, suspicious for ileus versus partial small bowel obstruction. 2. Multifocal patchy ground-glass and peripheral consolidative opacities in the lung bases, suspicious for multifocal pneumonia. 3. Small volume ascites. 4. Colonic diverticulosis without acute diverticulitis. 5. Aortic Atherosclerosis (ICD10-I70.0). Electronically  Signed   By: Agustin Cree M.D.   On: 10/25/2022 15:40  DG Chest Portable 1 View  Result Date: 10/25/2022 CLINICAL DATA:  Weakness with chest and abdominal pain. EXAM: PORTABLE CHEST 1 VIEW COMPARISON:  10/28/2020 FINDINGS: Asymmetric airspace disease is mid and lower lung predominant, left greater than right. No pulmonary edema or pleural effusion. The cardio pericardial silhouette is enlarged. Status post right shoulder replacement. Degenerative changes noted left shoulder. IMPRESSION: Asymmetric airspace disease, left greater than right. Imaging features suggest multifocal pneumonia. The bilateral lung opacity has a somewhat nodular character and close follow-up recommended to exclude metastatic disease. Electronically Signed   By: Kennith Center M.D.   On: 10/25/2022 13:19    Anti-infectives: Anti-infectives (From admission, onward)    Start     Dose/Rate Route Frequency Ordered Stop   10/25/22 1845  piperacillin-tazobactam (ZOSYN) IVPB 3.375 g  Status:  Discontinued        3.375 g 100 mL/hr over 30 Minutes Intravenous  Once 10/25/22 1833 10/26/22 2151   10/25/22 1800  piperacillin-tazobactam (ZOSYN) IVPB 3.375 g        3.375 g 12.5 mL/hr over 240 Minutes Intravenous Every 8 hours 10/25/22 1726     10/25/22 1430  cefTRIAXone (ROCEPHIN) 1 g in sodium chloride 0.9 % 100 mL IVPB        1 g 200 mL/hr over 30 Minutes Intravenous  Once 10/25/22 1426 10/25/22 1516   10/25/22 1430  azithromycin (ZITHROMAX) 500 mg in sodium chloride 0.9 % 250 mL IVPB        500 mg 250 mL/hr over 60 Minutes Intravenous  Once 10/25/22 1426 10/25/22 1634       Assessment/Plan: Abdominal Pain Possible SBO vs. Ileus Patient's abdominal pain has resolved and she is still passing stool and gas. Can advance diet as tolerated, starting full liquids now. No acute need for surgical intervention at this time.  Leukocytosis Patient's WBC count increased to 20 today from 16 yesterday. I think this is unlikely to be GI in  origin given absence of abdominal pain. Likely due to pneumonia. Continue to trend CBC and give abx.  LOS: 2 days    Barrett Shell 10/27/2022

## 2022-10-28 DIAGNOSIS — J45909 Unspecified asthma, uncomplicated: Secondary | ICD-10-CM | POA: Diagnosis not present

## 2022-10-28 DIAGNOSIS — K56609 Unspecified intestinal obstruction, unspecified as to partial versus complete obstruction: Secondary | ICD-10-CM | POA: Diagnosis not present

## 2022-10-28 DIAGNOSIS — K219 Gastro-esophageal reflux disease without esophagitis: Secondary | ICD-10-CM | POA: Diagnosis not present

## 2022-10-28 DIAGNOSIS — J189 Pneumonia, unspecified organism: Secondary | ICD-10-CM

## 2022-10-28 MED ORDER — AMOXICILLIN-POT CLAVULANATE 875-125 MG PO TABS: 1.0000 | ORAL_TABLET | Freq: Two times a day (BID) | ORAL | 0 refills | Status: AC

## 2022-10-28 MED ORDER — LOSARTAN POTASSIUM 50 MG PO TABS: 25.0000 mg | ORAL_TABLET | Freq: Every day | ORAL | Status: AC

## 2022-10-28 NOTE — Discharge Summary (Signed)
Physician Discharge Summary   Patient: Jessica Stevens MRN: [id number] DOB: Jul 23, 1965  Admit date:     10/25/2022  Discharge date: 10/28/22  Discharge Physician: Vassie Loll   PCP: Benita Stabile, MD   Recommendations at discharge:  Repeat chest x-ray in 6 weeks to assure resolution of infiltrates Repeat WBCs to follow hemoglobin/WBCs trend Repeat basic metabolic panel to follow electrolytes and renal function  Discharge Diagnoses: Principal Problem:   SBO (small bowel obstruction) (HCC) Active Problems:   Asthma   GERD   Community acquired pneumonia  Brief hospital admission narrative : As per H&P written by Dr. Randol Kern on 10/25/2022 Jessica Stevens  is a 57 y.o. 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  Patient reports she difficulty hearing as there is a piece of left ear hearing aid stuck in her ear canal since Monday (was removed by ED)-  - in ED patient was noted to be tachycardic, afebrile, white blood cell count elevated at 12.7, lactic acid elevated at 2.7 improved with IV fluids to 1.7, imaging significant for ileus versus SBO, and multifocal pneumonia, creatinine elevated at 1.18, discussed with Dr. Lovell Sheehan, who requested to admit to any pain hospital by [facility], he will evaluate in a.m., [facility] consulted to admit  Assessment and Plan: 1-small bowel obstruction versus  ileus -Patient with history of Roux-en-Y surgery of 2023 -Patient denies any nausea vomiting. -Patient expressed passing gas, moving her bowels without problems and at present not having abdominal pain -Electrolytes within normal limits at discharge.  Tolerating diet and feeling ready to go home. -Patient has been encouraged to increase physical activity and to maintain adequate hydration.. -No surgical intervention required.   2-sepsis secondary to pneumonia -Patient met criteria for sepsis with leukocytosis, lactic acidosis, tachycardia and tachypnea -Chest x-ray demonstrating infiltrates with concern for aspiration pneumonia -Antibiotics have been transitioned to oral route using Augmentin to complete therapy -Patient has been encouraged to continue the use of incentive spirometer/flutter valve and mucolytic's. -No oxygen supplementation required at discharge. -Lactic acid within normal limits after fluid resuscitation.. -Sepsis features essentially resolved; only WBCs appears to be still elevated. -Outpatient follow-up with PCP has been recommended   4-gastroesophageal flux disease -Continue PPI   5-history of asthma -No wheezing appreciated on exam -Continue as needed bronchodilator.   6-status post gastric bypass -Patient with over 150 pound weight loss since surgery -Continue thiamine, B12, adequate hydration and supportive care.   Consultants: General surgery. Procedures performed: See below for x-ray reports Disposition: Home Diet recommendation: Heart healthy diet.  DISCHARGE MEDICATION: Allergies as of 10/28/2022       Reactions   Oxycodone-acetaminophen Rash   Not allergic to tylenol        Medication List     STOP taking these medications    HYDROcodone-acetaminophen 10-325 MG tablet Commonly known as: NORCO   meloxicam 15 MG tablet Commonly known as: MOBIC   rosuvastatin 20 MG tablet Commonly known as: CRESTOR   tiZANidine 2 MG tablet  Commonly  known as: ZANAFLEX       TAKE these medications    AIRBORNE PO Take 1 tablet by mouth in the morning and at bedtime.   Hair Skin & Nails Tabs Take 1 tablet by mouth daily.   amLODipine 5 MG tablet Commonly known as: NORVASC Take 1 tablet by mouth daily.   amoxicillin-clavulanate 875-125 MG tablet Commonly known as: AUGMENTIN Take 1 tablet by mouth 2 (two) times daily for 5 days.   busPIRone 5 MG tablet Commonly known as: BUSPAR Take 5 mg by mouth 2 (two) times daily as needed (stress).   clonazePAM 0.5 MG tablet Commonly known as: KLONOPIN Take 0.5 mg by mouth 2 (two) times daily as needed for anxiety.   cyanocobalamin 1000 MCG/ML injection Commonly known as: VITAMIN B12 Inject 1,000 mcg into the muscle every 30 (thirty) days.   furosemide 20 MG tablet Commonly known as: LASIX Take 20 mg by mouth every morning.   hydrALAZINE 25 MG tablet Commonly known as: APRESOLINE Take 25 mg by mouth 2 (two) times daily.   HYDROmorphone 2 MG tablet Commonly known as: Dilaudid Take 1 tablet (2 mg total) by mouth every 4 (four) hours as needed for severe pain (for severe pain only and do not take with Hydrocodone).   levocetirizine 5 MG tablet Commonly known as: XYZAL Take 5 mg by mouth every evening.   levothyroxine 112 MCG tablet Commonly known as: SYNTHROID Take 112 mcg by mouth daily before breakfast.   losartan 50 MG tablet Commonly known as: COZAAR Take 0.5 tablets (25 mg total) by mouth daily. What changed: how much to take   ondansetron 4 MG tablet Commonly known as: Zofran Take 1 tablet (4 mg total) by mouth every 8 (eight) hours as needed for nausea, vomiting or refractory nausea / vomiting.   pantoprazole 40 MG tablet Commonly known as: PROTONIX Take 1 tablet (40 mg total) by mouth daily.   potassium chloride 10 MEQ CR capsule Commonly known as: MICRO-K Take 10 mEq by mouth daily.   Symbicort 80-4.5 MCG/ACT inhaler Generic drug:  budesonide-formoterol Inhale 2 puffs into the lungs 2 (two) times daily.   Voltaren 1 % Gel Generic drug: diclofenac Sodium Apply 2 g topically daily as needed (pain).        Follow-up Information     Benita Stabile, MD. Schedule an appointment as soon as possible for a visit in 10 day(s).   Specialty: Internal Medicine Contact information: [address] [place] [address] [phone]                Discharge Exam: Filed Weights   10/25/22 1222 10/26/22 0333  Weight: 68 kg 69 kg   General exam: Alert, awake, oriented x 3;   No fever, no chest pain, reports no abdominal pain.  Tolerating diet without problems and feeling ready to go home.  Patient expressed no nausea, no vomiting, moving her bowels and passing gas. Respiratory system: Scattered rhonchi are appreciated on exam; no using accessory muscle.  Good saturation on room air Cardiovascular system:RRR. No rubs or gallops; no JVD. Gastrointestinal system: Abdomen is soft, positive bowel sounds; no guarding. Central nervous system: Alert and oriented. No focal neurological deficits. Extremities: No cyanosis or clubbing. Skin: No petechiae. Psychiatry: Judgement and insight appear normal. Mood & affect appropriate.   Condition at discharge: Stable and improved.  The results of significant diagnostics from this hospitalization (including imaging, microbiology, ancillary and laboratory) are listed below for reference.  Imaging Studies: CT ABDOMEN PELVIS W CONTRAST  Result Date: 10/25/2022 CLINICAL DATA:  Day history of left lower quadrant abdominal pain and generalized weakness EXAM: CT ABDOMEN AND PELVIS WITH CONTRAST TECHNIQUE: Multidetector CT imaging of the abdomen and pelvis was performed using the standard protocol following bolus administration of intravenous contrast. RADIATION DOSE REDUCTION: This exam was performed according to the departmental dose-optimization program which includes automated  exposure control, adjustment of the mA and/or kV according to patient size and/or use of iterative reconstruction technique. CONTRAST:  OMNIPAQUE IOHEXOL 300 MG/ML  SOLN COMPARISON:  CT abdomen and pelvis dated 06/27/2021 FINDINGS: Lower chest: Multifocal patchy ground-glass and peripheral consolidative opacities. No pleural effusion or pneumothorax demonstrated. Partially imaged heart size is normal. Hepatobiliary: No focal hepatic lesions. No intra or extrahepatic biliary ductal dilation. Normal gallbladder. Pancreas: No focal lesions or main ductal dilation. Spleen: Normal in size without focal abnormality. Adrenals/Urinary Tract: No adrenal nodules. No suspicious renal mass, calculi or hydronephrosis. No focal bladder wall thickening. Stomach/Bowel: Postsurgical changes from bypass. Diffuse small bowel dilation with multiple loops demonstrating caliber narrowing centered in the right lower quadrant mesentery. Colonic diverticulosis without acute diverticulitis. Normal appendix. Vascular/Lymphatic: Aortic atherosclerosis. No enlarged abdominal or pelvic lymph nodes. Reproductive: No adnexal masses. Other: Small volume ascites.  No free air or fluid collection. Musculoskeletal: No acute or abnormal lytic or blastic osseous lesions. Multilevel degenerative changes of the partially imaged thoracic and lumbar spine. Unchanged grade 1 anterolisthesis at L3-4 and L4-5 with grade 1 retrolisthesis at L5-S1. Similar vertebral body height loss of L5. Mild body wall edema. IMPRESSION: 1. Diffuse small bowel dilation with multiple loops demonstrating caliber narrowing centered in the right lower quadrant mesentery, suspicious for ileus versus partial small bowel obstruction. 2. Multifocal patchy ground-glass and peripheral consolidative opacities in the lung bases, suspicious for multifocal pneumonia. 3. Small volume ascites. 4. Colonic diverticulosis without acute diverticulitis. 5. Aortic Atherosclerosis  (ICD10-I70.0). Electronically Signed   By: Agustin Cree M.D.   On: 10/25/2022 15:40   DG Chest Portable 1 View  Result Date: 10/25/2022 CLINICAL DATA:  Weakness with chest and abdominal pain. EXAM: PORTABLE CHEST 1 VIEW COMPARISON:  10/28/2020 FINDINGS: Asymmetric airspace disease is mid and lower lung predominant, left greater than right. No pulmonary edema or pleural effusion. The cardio pericardial silhouette is enlarged. Status post right shoulder replacement. Degenerative changes noted left shoulder. IMPRESSION: Asymmetric airspace disease, left greater than right. Imaging features suggest multifocal pneumonia. The bilateral lung opacity has a somewhat nodular character and close follow-up recommended to exclude metastatic disease. Electronically Signed   By: Kennith Center M.D.   On: 10/25/2022 13:19    Microbiology: Results for orders placed or performed during the hospital encounter of 10/25/22  SARS Coronavirus 2 by RT PCR (hospital order, performed in [facility] hospital lab) *cepheid single result test* Anterior Nasal Swab     Status: None   Collection Time: 10/25/22  1:00 PM   Specimen: Anterior Nasal Swab  Result Value Ref Range Status   SARS Coronavirus 2 by RT PCR NEGATIVE NEGATIVE Final    Comment: (NOTE) SARS-CoV-2 target nucleic acids are NOT DETECTED.  The SARS-CoV-2 RNA is generally detectable in upper and lower respiratory specimens during the acute phase of infection. The lowest concentration of SARS-CoV-2 viral copies this assay can detect is 250 copies / mL. A negative result does not preclude SARS-CoV-2 infection and should not be used as the sole basis for treatment or other patient management decisions.  A negative result may occur with improper specimen collection / handling, submission of specimen other than nasopharyngeal swab, presence of viral mutation(s) within the areas targeted by this assay, and inadequate number of viral copies (<250 copies / mL). A negative  result must be combined with clinical observations, patient history, and epidemiological information.  Fact Sheet for Patients:   RoadLapTop.co.za  Fact Sheet for Healthcare Providers: http://kim-miller.com/  This test is not yet approved or  cleared by the [place] FDA and has been authorized for detection and/or diagnosis of SARS-CoV-2 by FDA under an Emergency Use Authorization (EUA).  This EUA will remain in effect (meaning this test can be used) for the duration of the COVID-19 declaration under Section 564(b)(1) of the Act, 21 U.S.C. section 360bbb-3(b)(1), unless the authorization is terminated or revoked sooner.  Performed at [facility], [address] [place], [place] [address]   Culture, blood (routine x 2)     Status: None (Preliminary result)   Collection Time: 10/25/22  2:02 PM   Specimen: BLOOD  Result Value Ref Range Status   Specimen Description BLOOD BLOOD LEFT HAND  Final   Special Requests   Final    BOTTLES DRAWN AEROBIC ONLY Blood Culture results may not be optimal due to an inadequate volume of blood received in culture bottles   Culture   Final    NO GROWTH 3 DAYS Performed at [facility], [address], [place], [place] [address]    Report Status PENDING  Incomplete  Culture, blood (routine x 2)     Status: None (Preliminary result)   Collection Time: 10/25/22  2:02 PM   Specimen: BLOOD  Result Value Ref Range Status   Specimen Description BLOOD BLOOD RIGHT HAND  Final   Special Requests   Final    BOTTLES DRAWN AEROBIC ONLY Blood Culture results may not be optimal due to an inadequate volume of blood received in culture bottles   Culture   Final    NO GROWTH 3 DAYS Performed at [facility], [address] [place], [place] [address]    Report Status PENDING  Incomplete    Labs: CBC: Recent Labs  Lab 10/25/22 1305 10/26/22 0419 10/27/22 0407  WBC 12.7* 16.1* 20.3*  NEUTROABS 9.1*  --   --    HGB 13.2 11.4* 9.7*  HCT 39.6 35.8* 30.2*  MCV 87.6 91.1 89.9  PLT 227 222 192   Basic Metabolic Panel: Recent Labs  Lab 10/25/22 1305 10/26/22 0419 10/27/22 0407  NA 135 136 136  K 4.4 3.9 4.1  CL 96* 102 104  CO2 28 27 28   GLUCOSE 104* 76 95  BUN 33* 28* 14  CREATININE 1.18* 1.13* 1.01*  CALCIUM 8.7* 8.6* 8.5*  PHOS 4.0 3.7  --    Liver Function Tests: Recent Labs  Lab 10/25/22 1305  AST 32  ALT 20  ALKPHOS 82  BILITOT 0.5  PROT 6.7  ALBUMIN 3.3*   CBG: No results for input(s): "GLUCAP" in the last 168 hours.  Discharge time spent: greater than 30 minutes.  Signed: Vassie Loll, MD [facility] 10/28/2022

## 2022-10-28 NOTE — Progress Notes (Signed)
  Subjective: No nausea or vomiting.  No abdominal pain.  Is passing gas and having bowel movement.  Tolerating soft diet well.  Objective: Vital signs in last 24 hours: Temp:  [99.1 F (37.3 C)-100.1 F (37.8 C)] 99.2 F (37.3 C) (08/31 0504) Pulse Rate:  [72-85] 73 (08/31 0504) Resp:  [19-20] 19 (08/31 0504) BP: (134-156)/(79-96) 134/96 (08/31 0504) SpO2:  [100 %] 100 % (08/31 0504) Last BM Date : 10/27/22  Intake/Output from previous day: No intake/output data recorded. Intake/Output this shift: No intake/output data recorded.  General appearance: cooperative and no distress GI: soft, non-tender; bowel sounds normal; no masses,  no organomegaly  Lab Results:  Recent Labs    10/26/22 0419 10/27/22 0407  WBC 16.1* 20.3*  HGB 11.4* 9.7*  HCT 35.8* 30.2*  PLT 222 192   BMET Recent Labs    10/26/22 0419 10/27/22 0407  NA 136 136  K 3.9 4.1  CL 102 104  CO2 27 28  GLUCOSE 76 95  BUN 28* 14  CREATININE 1.13* 1.01*  CALCIUM 8.6* 8.5*   PT/INR No results for input(s): "LABPROT", "INR" in the last 72 hours.  Studies/Results: No results found.  Anti-infectives: Anti-infectives (From admission, onward)    Start     Dose/Rate Route Frequency Ordered Stop   10/25/22 1845  piperacillin-tazobactam (ZOSYN) IVPB 3.375 g  Status:  Discontinued        3.375 g 100 mL/hr over 30 Minutes Intravenous  Once 10/25/22 1833 10/26/22 2151   10/25/22 1800  piperacillin-tazobactam (ZOSYN) IVPB 3.375 g        3.375 g 12.5 mL/hr over 240 Minutes Intravenous Every 8 hours 10/25/22 1726     10/25/22 1430  cefTRIAXone (ROCEPHIN) 1 g in sodium chloride 0.9 % 100 mL IVPB        1 g 200 mL/hr over 30 Minutes Intravenous  Once 10/25/22 1426 10/25/22 1516   10/25/22 1430  azithromycin (ZITHROMAX) 500 mg in sodium chloride 0.9 % 250 mL IVPB        500 mg 250 mL/hr over 60 Minutes Intravenous  Once 10/25/22 1426 10/25/22 1634       Assessment/Plan: Impression: Partial small bowel  obstruction/ileus resolved.  No need for surgical intervention.  Okay for discharge from surgery standpoint.  Will sign off.  Please call me if I can be of further assistance.  LOS: 3 days    Franky Macho 10/28/2022

## 2022-10-28 NOTE — Progress Notes (Signed)
Patient has discharge orders, discharge teaching given and no further questions at this time, pt wheeled out to main lobby by staff vehicle accompanied by sister.

## 2022-10-30 LAB — CULTURE, BLOOD (ROUTINE X 2)
Culture: NO GROWTH
Culture: NO GROWTH

## 2022-11-01 DIAGNOSIS — Z713 Dietary counseling and surveillance: Secondary | ICD-10-CM | POA: Diagnosis not present

## 2022-11-01 DIAGNOSIS — A419 Sepsis, unspecified organism: Secondary | ICD-10-CM | POA: Diagnosis not present

## 2022-11-01 DIAGNOSIS — R634 Abnormal weight loss: Secondary | ICD-10-CM | POA: Diagnosis not present

## 2022-11-01 DIAGNOSIS — K56609 Unspecified intestinal obstruction, unspecified as to partial versus complete obstruction: Secondary | ICD-10-CM | POA: Diagnosis not present

## 2022-11-16 DIAGNOSIS — E538 Deficiency of other specified B group vitamins: Secondary | ICD-10-CM | POA: Diagnosis not present

## 2022-12-19 DIAGNOSIS — E538 Deficiency of other specified B group vitamins: Secondary | ICD-10-CM | POA: Diagnosis not present

## 2022-12-21 DIAGNOSIS — G894 Chronic pain syndrome: Secondary | ICD-10-CM | POA: Diagnosis not present

## 2022-12-21 DIAGNOSIS — J309 Allergic rhinitis, unspecified: Secondary | ICD-10-CM | POA: Diagnosis not present

## 2022-12-21 DIAGNOSIS — Z79891 Long term (current) use of opiate analgesic: Secondary | ICD-10-CM | POA: Diagnosis not present

## 2022-12-21 DIAGNOSIS — H9213 Otorrhea, bilateral: Secondary | ICD-10-CM | POA: Diagnosis not present

## 2022-12-28 ENCOUNTER — Encounter (HOSPITAL_COMMUNITY): Payer: Self-pay | Admitting: *Deleted

## 2023-01-16 DIAGNOSIS — E538 Deficiency of other specified B group vitamins: Secondary | ICD-10-CM | POA: Diagnosis not present

## 2023-01-17 DIAGNOSIS — Z1329 Encounter for screening for other suspected endocrine disorder: Secondary | ICD-10-CM | POA: Diagnosis not present

## 2023-01-17 DIAGNOSIS — M793 Panniculitis, unspecified: Secondary | ICD-10-CM | POA: Diagnosis not present

## 2023-01-17 DIAGNOSIS — Z9884 Bariatric surgery status: Secondary | ICD-10-CM | POA: Diagnosis not present

## 2023-01-17 DIAGNOSIS — Z1321 Encounter for screening for nutritional disorder: Secondary | ICD-10-CM | POA: Diagnosis not present

## 2023-01-17 DIAGNOSIS — Z130 Encounter for screening for diseases of the blood and blood-forming organs and certain disorders involving the immune mechanism: Secondary | ICD-10-CM | POA: Diagnosis not present

## 2023-02-12 DIAGNOSIS — E538 Deficiency of other specified B group vitamins: Secondary | ICD-10-CM | POA: Diagnosis not present

## 2023-02-12 DIAGNOSIS — Z23 Encounter for immunization: Secondary | ICD-10-CM | POA: Diagnosis not present

## 2023-02-13 DIAGNOSIS — Z9884 Bariatric surgery status: Secondary | ICD-10-CM | POA: Diagnosis not present

## 2023-02-13 DIAGNOSIS — E538 Deficiency of other specified B group vitamins: Secondary | ICD-10-CM | POA: Diagnosis not present

## 2023-02-13 DIAGNOSIS — I10 Essential (primary) hypertension: Secondary | ICD-10-CM | POA: Diagnosis not present

## 2023-02-13 DIAGNOSIS — M19112 Post-traumatic osteoarthritis, left shoulder: Secondary | ICD-10-CM | POA: Diagnosis not present

## 2023-02-13 DIAGNOSIS — E039 Hypothyroidism, unspecified: Secondary | ICD-10-CM | POA: Diagnosis not present

## 2023-02-13 DIAGNOSIS — D649 Anemia, unspecified: Secondary | ICD-10-CM | POA: Diagnosis not present

## 2023-02-13 DIAGNOSIS — M25512 Pain in left shoulder: Secondary | ICD-10-CM | POA: Diagnosis not present

## 2023-02-26 DIAGNOSIS — H9193 Unspecified hearing loss, bilateral: Secondary | ICD-10-CM | POA: Diagnosis not present

## 2023-02-26 DIAGNOSIS — G894 Chronic pain syndrome: Secondary | ICD-10-CM | POA: Diagnosis not present

## 2023-02-26 DIAGNOSIS — H9211 Otorrhea, right ear: Secondary | ICD-10-CM | POA: Diagnosis not present

## 2023-03-12 DIAGNOSIS — H9211 Otorrhea, right ear: Secondary | ICD-10-CM | POA: Diagnosis not present

## 2023-03-12 DIAGNOSIS — Z9622 Myringotomy tube(s) status: Secondary | ICD-10-CM | POA: Diagnosis not present

## 2023-03-12 DIAGNOSIS — H7292 Unspecified perforation of tympanic membrane, left ear: Secondary | ICD-10-CM | POA: Diagnosis not present

## 2023-03-22 DIAGNOSIS — E782 Mixed hyperlipidemia: Secondary | ICD-10-CM | POA: Diagnosis not present

## 2023-03-22 DIAGNOSIS — I129 Hypertensive chronic kidney disease with stage 1 through stage 4 chronic kidney disease, or unspecified chronic kidney disease: Secondary | ICD-10-CM | POA: Diagnosis not present

## 2023-03-22 DIAGNOSIS — D473 Essential (hemorrhagic) thrombocythemia: Secondary | ICD-10-CM | POA: Diagnosis not present

## 2023-03-22 DIAGNOSIS — S199XXA Unspecified injury of neck, initial encounter: Secondary | ICD-10-CM | POA: Diagnosis not present

## 2023-03-22 DIAGNOSIS — E538 Deficiency of other specified B group vitamins: Secondary | ICD-10-CM | POA: Diagnosis not present

## 2023-03-22 DIAGNOSIS — N189 Chronic kidney disease, unspecified: Secondary | ICD-10-CM | POA: Diagnosis not present

## 2023-03-22 DIAGNOSIS — D631 Anemia in chronic kidney disease: Secondary | ICD-10-CM | POA: Diagnosis not present

## 2023-03-22 DIAGNOSIS — I10 Essential (primary) hypertension: Secondary | ICD-10-CM | POA: Diagnosis not present

## 2023-03-22 DIAGNOSIS — Z0001 Encounter for general adult medical examination with abnormal findings: Secondary | ICD-10-CM | POA: Diagnosis not present

## 2023-03-22 DIAGNOSIS — N183 Chronic kidney disease, stage 3 unspecified: Secondary | ICD-10-CM | POA: Diagnosis not present

## 2023-03-22 DIAGNOSIS — S3992XA Unspecified injury of lower back, initial encounter: Secondary | ICD-10-CM | POA: Diagnosis not present

## 2023-03-22 DIAGNOSIS — T1490XA Injury, unspecified, initial encounter: Secondary | ICD-10-CM | POA: Diagnosis not present

## 2023-04-14 ENCOUNTER — Emergency Department (HOSPITAL_COMMUNITY): Payer: 59

## 2023-04-14 ENCOUNTER — Other Ambulatory Visit: Payer: Self-pay

## 2023-04-14 ENCOUNTER — Encounter (HOSPITAL_COMMUNITY): Payer: Self-pay

## 2023-04-14 ENCOUNTER — Emergency Department (HOSPITAL_COMMUNITY)
Admission: EM | Admit: 2023-04-14 | Discharge: 2023-04-14 | Disposition: A | Discharge status: Home / Self Care | Payer: 59

## 2023-04-14 DIAGNOSIS — E039 Hypothyroidism, unspecified: Secondary | ICD-10-CM | POA: Insufficient documentation

## 2023-04-14 DIAGNOSIS — M545 Low back pain, unspecified: Secondary | ICD-10-CM | POA: Insufficient documentation

## 2023-04-14 DIAGNOSIS — Z96611 Presence of right artificial shoulder joint: Secondary | ICD-10-CM | POA: Diagnosis not present

## 2023-04-14 DIAGNOSIS — R0789 Other chest pain: Secondary | ICD-10-CM | POA: Insufficient documentation

## 2023-04-14 DIAGNOSIS — M5134 Other intervertebral disc degeneration, thoracic region: Secondary | ICD-10-CM | POA: Diagnosis not present

## 2023-04-14 DIAGNOSIS — I700 Atherosclerosis of aorta: Secondary | ICD-10-CM | POA: Insufficient documentation

## 2023-04-14 DIAGNOSIS — M5030 Other cervical disc degeneration, unspecified cervical region: Secondary | ICD-10-CM | POA: Insufficient documentation

## 2023-04-14 DIAGNOSIS — R079 Chest pain, unspecified: Secondary | ICD-10-CM | POA: Diagnosis not present

## 2023-04-14 DIAGNOSIS — I10 Essential (primary) hypertension: Secondary | ICD-10-CM | POA: Diagnosis not present

## 2023-04-14 DIAGNOSIS — Z79899 Other long term (current) drug therapy: Secondary | ICD-10-CM | POA: Insufficient documentation

## 2023-04-14 DIAGNOSIS — M4313 Spondylolisthesis, cervicothoracic region: Secondary | ICD-10-CM | POA: Diagnosis not present

## 2023-04-14 DIAGNOSIS — G8929 Other chronic pain: Secondary | ICD-10-CM | POA: Insufficient documentation

## 2023-04-14 DIAGNOSIS — M546 Pain in thoracic spine: Secondary | ICD-10-CM | POA: Insufficient documentation

## 2023-04-14 DIAGNOSIS — Z20822 Contact with and (suspected) exposure to covid-19: Secondary | ICD-10-CM | POA: Insufficient documentation

## 2023-04-14 DIAGNOSIS — M4802 Spinal stenosis, cervical region: Secondary | ICD-10-CM | POA: Insufficient documentation

## 2023-04-14 LAB — TROPONIN I (HIGH SENSITIVITY)
Troponin I (High Sensitivity): 4 ng/L (ref <18)
Troponin I (High Sensitivity): 4 ng/L (ref <18)

## 2023-04-14 LAB — BASIC METABOLIC PANEL
Anion gap: 12 (ref 5–15)
BUN: 19 mg/dL (ref 6–20)
CO2: 32 mmol/L (ref 22–32)
Calcium: 9.6 mg/dL (ref 8.9–10.3)
Chloride: 98 mmol/L (ref 98–111)
Creatinine, Ser: 0.77 mg/dL (ref 0.44–1.00)
GFR, Estimated: >60 mL/min (ref >60)
Glucose, Bld: 115 mg/dL — ABNORMAL HIGH (ref 70–99)
Potassium: 3.6 mmol/L (ref 3.5–5.1)
Sodium: 142 mmol/L (ref 135–145)

## 2023-04-14 LAB — CBC
HCT: 35.6 % — ABNORMAL LOW (ref 36.0–46.0)
Hemoglobin: 11.8 g/dL — ABNORMAL LOW (ref 12.0–15.0)
MCH: 29.9 pg (ref 26.0–34.0)
MCHC: 33.1 g/dL (ref 30.0–36.0)
MCV: 90.4 fL (ref 80.0–100.0)
Platelets: 281 10*3/uL (ref 150–400)
RBC: 3.94 MIL/uL (ref 3.87–5.11)
RDW: 15.1 % (ref 11.5–15.5)
WBC: 11 10*3/uL — ABNORMAL HIGH (ref 4.0–10.5)
nRBC: 0 % (ref 0.0–0.2)

## 2023-04-14 LAB — RESP PANEL BY RT-PCR (RSV, FLU A&B, COVID)  RVPGX2
Influenza A by PCR: NEGATIVE
Influenza B by PCR: NEGATIVE
Resp Syncytial Virus by PCR: NEGATIVE
SARS Coronavirus 2 by RT PCR: NEGATIVE

## 2023-04-14 LAB — D-DIMER, QUANTITATIVE: D-Dimer, Quant: 0.45 ug{FEU}/mL (ref 0.00–0.50)

## 2023-04-14 MED ORDER — KETOROLAC TROMETHAMINE 15 MG/ML IJ SOLN
15.0000 mg | Freq: Once | INTRAMUSCULAR | Status: AC
  Administered 2023-04-14: 15 mg via INTRAVENOUS
  Filled 2023-04-14: qty 1

## 2023-04-14 MED ORDER — KETOROLAC TROMETHAMINE 10 MG PO TABS: 10.0000 mg | ORAL_TABLET | Freq: Four times a day (QID) | ORAL | 0 refills | Status: AC | PRN

## 2023-04-14 MED ORDER — LABETALOL HCL 5 MG/ML IV SOLN
10.0000 mg | Freq: Once | INTRAVENOUS | Status: AC
  Administered 2023-04-14: 10 mg via INTRAVENOUS
  Filled 2023-04-14: qty 4

## 2023-04-14 MED ORDER — HYDROMORPHONE HCL 1 MG/ML IJ SOLN
1.0000 mg | Freq: Once | INTRAMUSCULAR | Status: AC
  Administered 2023-04-14: 1 mg via INTRAVENOUS
  Filled 2023-04-14: qty 1

## 2023-04-14 MED ORDER — METHOCARBAMOL 500 MG PO TABS: 500.0000 mg | ORAL_TABLET | Freq: Two times a day (BID) | ORAL | 0 refills | Status: AC

## 2023-04-14 MED ORDER — ONDANSETRON HCL 4 MG/2ML IJ SOLN
4.0000 mg | Freq: Once | INTRAMUSCULAR | Status: AC
  Administered 2023-04-14: 4 mg via INTRAVENOUS
  Filled 2023-04-14: qty 2

## 2023-04-14 NOTE — ED Notes (Addendum)
Patient discharged. Provider spoke to patient. Paperwork given to patient and reviewed. Pt verbalized understanding. VSS. A+Ox4. Patient ambulated out of the ER with steady, independent gait. Son driving patient home. IV removed intact without any difficulties.

## 2023-04-14 NOTE — ED Triage Notes (Signed)
Pt stated that she has been having what she says is gas pains in her upper bilat chest, and spine area. Pt stated that she has had this before but never this bad. Pt crying in triages.
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
## 2023-04-14 NOTE — Discharge Instructions (Signed)
Your workup today was reassuring.  I think that your pain may be coming from your neck/back.  The CT scans show some arthritis but no acute injuries.  Please continue your home pain medication and take the Toradol over the next few days.  The muscle take the muscle relaxer with this.  Do not drive or drink alcohol while taking the muscle relaxers may make you drowsy.  Please follow-up with your primary care doctor for reevaluation and return to the ER for worsening symptoms.

## 2023-04-24 DIAGNOSIS — Z7689 Persons encountering health services in other specified circumstances: Secondary | ICD-10-CM | POA: Diagnosis not present

## 2023-04-24 DIAGNOSIS — E538 Deficiency of other specified B group vitamins: Secondary | ICD-10-CM | POA: Diagnosis not present

## 2023-04-24 DIAGNOSIS — M542 Cervicalgia: Secondary | ICD-10-CM | POA: Diagnosis not present

## 2023-04-30 DIAGNOSIS — N6311 Unspecified lump in the right breast, upper outer quadrant: Secondary | ICD-10-CM | POA: Diagnosis not present

## 2023-05-02 ENCOUNTER — Other Ambulatory Visit (HOSPITAL_COMMUNITY): Payer: Self-pay | Admitting: Family Medicine

## 2023-05-02 DIAGNOSIS — N63 Unspecified lump in unspecified breast: Secondary | ICD-10-CM

## 2023-05-17 DIAGNOSIS — E538 Deficiency of other specified B group vitamins: Secondary | ICD-10-CM | POA: Diagnosis not present

## 2023-05-24 ENCOUNTER — Ambulatory Visit (HOSPITAL_COMMUNITY)
Admission: RE | Admit: 2023-05-24 | Discharge: 2023-05-24 | Disposition: A | Discharge status: Home / Self Care | Source: Ambulatory Visit | Attending: Family Medicine | Admitting: Family Medicine

## 2023-05-24 ENCOUNTER — Other Ambulatory Visit (HOSPITAL_COMMUNITY): Payer: Self-pay | Admitting: Family Medicine

## 2023-05-24 DIAGNOSIS — N6311 Unspecified lump in the right breast, upper outer quadrant: Secondary | ICD-10-CM | POA: Diagnosis not present

## 2023-05-24 DIAGNOSIS — Z9889 Other specified postprocedural states: Secondary | ICD-10-CM | POA: Diagnosis not present

## 2023-05-24 DIAGNOSIS — Z803 Family history of malignant neoplasm of breast: Secondary | ICD-10-CM | POA: Diagnosis not present

## 2023-05-24 DIAGNOSIS — Z9884 Bariatric surgery status: Secondary | ICD-10-CM | POA: Insufficient documentation

## 2023-05-24 DIAGNOSIS — R928 Other abnormal and inconclusive findings on diagnostic imaging of breast: Secondary | ICD-10-CM

## 2023-05-24 DIAGNOSIS — C50911 Malignant neoplasm of unspecified site of right female breast: Secondary | ICD-10-CM | POA: Diagnosis not present

## 2023-05-24 DIAGNOSIS — N63 Unspecified lump in unspecified breast: Secondary | ICD-10-CM

## 2023-05-24 DIAGNOSIS — R92323 Mammographic fibroglandular density, bilateral breasts: Secondary | ICD-10-CM | POA: Diagnosis not present

## 2023-06-04 ENCOUNTER — Other Ambulatory Visit (HOSPITAL_COMMUNITY): Payer: Self-pay | Admitting: Family Medicine

## 2023-06-04 DIAGNOSIS — R928 Other abnormal and inconclusive findings on diagnostic imaging of breast: Secondary | ICD-10-CM

## 2023-06-05 ENCOUNTER — Ambulatory Visit (HOSPITAL_COMMUNITY)
Admission: RE | Admit: 2023-06-05 | Discharge: 2023-06-05 | Disposition: A | Discharge status: Home / Self Care | Source: Ambulatory Visit | Attending: Family Medicine | Admitting: Family Medicine

## 2023-06-05 ENCOUNTER — Encounter (HOSPITAL_COMMUNITY): Payer: Self-pay

## 2023-06-05 ENCOUNTER — Ambulatory Visit (HOSPITAL_COMMUNITY)
Admission: RE | Admit: 2023-06-05 | Discharge: 2023-06-05 | Discharge status: Home / Self Care | Source: Ambulatory Visit | Attending: Family Medicine | Admitting: Family Medicine

## 2023-06-05 DIAGNOSIS — N6031 Fibrosclerosis of right breast: Secondary | ICD-10-CM | POA: Diagnosis not present

## 2023-06-05 DIAGNOSIS — R928 Other abnormal and inconclusive findings on diagnostic imaging of breast: Secondary | ICD-10-CM

## 2023-06-05 DIAGNOSIS — N6311 Unspecified lump in the right breast, upper outer quadrant: Secondary | ICD-10-CM | POA: Diagnosis not present

## 2023-06-05 HISTORY — PX: BREAST BIOPSY: SHX20

## 2023-06-05 MED ORDER — LIDOCAINE-EPINEPHRINE (PF) 1 %-1:200000 IJ SOLN
10.0000 mL | Freq: Once | INTRAMUSCULAR | Status: AC
  Administered 2023-06-05: 10 mL via INTRADERMAL

## 2023-06-05 MED ORDER — LIDOCAINE HCL (PF) 2 % IJ SOLN
10.0000 mL | Freq: Once | INTRAMUSCULAR | Status: AC
  Administered 2023-06-05: 10 mL

## 2023-06-05 NOTE — Progress Notes (Signed)
 PT tolerated right breast biopsy well today with NAD noted. PT verbalized understanding of discharge instructions. PT ambulated back to the mammogram area this time and give ice pack to use at home. Specimens taken to lab by [facility] from ultrasound for processing.

## 2023-06-06 LAB — SURGICAL PATHOLOGY

## 2023-06-07 ENCOUNTER — Other Ambulatory Visit: Payer: Self-pay | Admitting: *Deleted

## 2023-06-07 DIAGNOSIS — N6310 Unspecified lump in the right breast, unspecified quadrant: Secondary | ICD-10-CM

## 2023-06-14 ENCOUNTER — Ambulatory Visit: Admitting: Surgery

## 2023-06-18 DIAGNOSIS — E538 Deficiency of other specified B group vitamins: Secondary | ICD-10-CM | POA: Diagnosis not present

## 2023-06-21 DIAGNOSIS — E785 Hyperlipidemia, unspecified: Secondary | ICD-10-CM | POA: Diagnosis not present

## 2023-06-21 DIAGNOSIS — I10 Essential (primary) hypertension: Secondary | ICD-10-CM | POA: Diagnosis not present

## 2023-06-21 DIAGNOSIS — E039 Hypothyroidism, unspecified: Secondary | ICD-10-CM | POA: Diagnosis not present

## 2023-06-26 ENCOUNTER — Encounter: Payer: Self-pay | Admitting: Surgery

## 2023-06-26 ENCOUNTER — Ambulatory Visit (INDEPENDENT_AMBULATORY_CARE_PROVIDER_SITE_OTHER): Admitting: Surgery

## 2023-06-26 VITALS — BP 144/82 | HR 73 | Temp 98.2°F | Resp 14 | Ht 63.0 in | Wt 145.0 lb

## 2023-06-26 DIAGNOSIS — N6310 Unspecified lump in the right breast, unspecified quadrant: Secondary | ICD-10-CM

## 2023-06-28 DIAGNOSIS — N189 Chronic kidney disease, unspecified: Secondary | ICD-10-CM | POA: Diagnosis not present

## 2023-06-28 DIAGNOSIS — I129 Hypertensive chronic kidney disease with stage 1 through stage 4 chronic kidney disease, or unspecified chronic kidney disease: Secondary | ICD-10-CM | POA: Diagnosis not present

## 2023-06-28 DIAGNOSIS — K219 Gastro-esophageal reflux disease without esophagitis: Secondary | ICD-10-CM | POA: Diagnosis not present

## 2023-06-28 DIAGNOSIS — D631 Anemia in chronic kidney disease: Secondary | ICD-10-CM | POA: Diagnosis not present

## 2023-06-28 DIAGNOSIS — E782 Mixed hyperlipidemia: Secondary | ICD-10-CM | POA: Diagnosis not present

## 2023-06-28 DIAGNOSIS — D473 Essential (hemorrhagic) thrombocythemia: Secondary | ICD-10-CM | POA: Diagnosis not present

## 2023-06-28 DIAGNOSIS — N183 Chronic kidney disease, stage 3 unspecified: Secondary | ICD-10-CM | POA: Diagnosis not present

## 2023-06-28 DIAGNOSIS — I10 Essential (primary) hypertension: Secondary | ICD-10-CM | POA: Diagnosis not present

## 2023-06-28 DIAGNOSIS — D649 Anemia, unspecified: Secondary | ICD-10-CM | POA: Diagnosis not present

## 2023-06-28 DIAGNOSIS — E039 Hypothyroidism, unspecified: Secondary | ICD-10-CM | POA: Diagnosis not present

## 2023-07-01 NOTE — H&P (Signed)
 [facility] History and Physical  Reason for Referral: Right breast mass Referring Physician: Wendi Ham, NP  Chief Complaint   New Patient (Initial Visit)     Jessica Stevens is a 58 y.o. female.  HPI: Patient presents for evaluation of a right breast mass.  She first noticed a lump on her right breast starting in March.  She denies any pain associated with this mass, but just wanted the area to be appropriately evaluated.  The patient has no history of any other masses, lumps, bumps, nipple changes or discharge.  She had menarche at age 38, and her first pregnancy at age 68. She is G2P1. She did not breastfeed her children.  She has a history of breast cancer in her sister. She underwent menopause at 54 when she underwent a partial hysterectomy.  She has never had any previous biopsies or concerning areas on mammogram.  She has not had any chest radiation.  Her past surgical history is significant for bilateral breast reduction 3 to 4 years ago and a subsequent gastric bypass surgery a year ago.  Her past medical history is significant for asthma, chronic back pain on Norco, GERD, hypertension, hyperlipidemia, and hypothyroidism.  She denies use of blood thinning medications.   Past Medical History:  Diagnosis Date   Allergy    Anemia    Ankle pain, left    Anxiety    Arthritis of knee, degenerative    Asthma    Carpal tunnel syndrome    Cervical radiculopathy    Chronic back pain    Complete rupture of rotator cuff    Complete rupture of rotator cuff    Depression    GERD (gastroesophageal reflux disease)    Gynecomastia    HNP (herniated nucleus pulposus), cervical    HNP (herniated nucleus pulposus), cervical    Hyperlipidemia    Hypertension    Hypothyroidism    Knee pain    OA (osteoarthritis) of knee    Obesity    Sleep apnea    hx of    Urinary incontinence     Past Surgical History:  Procedure Laterality Date   arthroscopy  left knee   08/14/2008   Dr. Phyllis Breeze    BIOPSY  09/20/2020   Procedure: BIOPSY;  Surgeon: Albertina Hugger, MD;  Location: [facility];  Service: Gastroenterology;;   BREAST BIOPSY Right 06/05/2023   path pending   BREAST BIOPSY Right 06/05/2023   US  RT BREAST BX W LOC DEV 1ST LESION IMG BX SPEC US  GUIDE 06/05/2023 Alger Infield, MD [facility]   COLONOSCOPY N/A 03/11/2014   Procedure: COLONOSCOPY;  Surgeon: Ruby Corporal, MD;  Location: [facility];  Service: Endoscopy;  Laterality: N/A;  730 - moved to 1/13 @ 12:00   COLONOSCOPY     ESOPHAGOGASTRODUODENOSCOPY (EGD) WITH PROPOFOL  N/A 09/20/2020   Procedure: ESOPHAGOGASTRODUODENOSCOPY (EGD) WITH PROPOFOL ;  Surgeon: Albertina Hugger, MD;  Location: [facility];  Service: Gastroenterology;  Laterality: N/A;   GASTRIC BYPASS  05/2021   PARTIAL HYSTERECTOMY     POLYPECTOMY     REVERSE SHOULDER ARTHROPLASTY Right 05/27/2020   Procedure: REVERSE SHOULDER ARTHROPLASTY;  Surgeon: Sammye Cristal, MD;  Location: [facility];  Service: Orthopedics;  Laterality: Right;   TOTAL KNEE ARTHROPLASTY Left 03/24/2022   Procedure: TOTAL KNEE ARTHROPLASTY;  Surgeon: Winston Hawking, MD;  Location: [facility];  Service: Orthopedics;  Laterality: Left;  spinal and general   tube insert in right ear  02/27/2001  UPPER GI ENDOSCOPY N/A 05/30/2021   Procedure: UPPER GI ENDOSCOPY;  Surgeon: Junie Olds, MD;  Location: [facility];  Service: General;  Laterality: N/A;    Family History  Problem Relation Age of Onset   Diabetes Mother    Hypertension Mother    Cancer Father        left nephrectomy    Hypertension Father    Hypertension Sister    Diabetes Maternal Grandmother    Hypertension Maternal Grandmother    Hypertension Maternal Grandfather    Colon cancer Neg Hx    Colon polyps Neg Hx    Esophageal cancer Neg Hx    Rectal cancer Neg Hx    Stomach cancer Neg Hx     Social History   Tobacco Use   Smoking status: Never   Smokeless tobacco: Never   Vaping Use   Vaping status: Never Used  Substance Use Topics   Alcohol use: Not Currently    Comment: occasionally    Drug use: Yes    Types: Marijuana    Comment: once every 2-3 weeks    Medications: I have reviewed the patient's current medications. Allergies as of 06/26/2023       Reactions   Oxycodone -acetaminophen  Rash   Not allergic to tylenol         Medication List        Accurate as of June 26, 2023 11:59 PM. If you have any questions, ask your nurse or doctor.          AIRBORNE PO Take 1 tablet by mouth in the morning and at bedtime.   Hair Skin & Nails Tabs Take 1 tablet by mouth daily.   amLODipine  5 MG tablet Commonly known as: NORVASC  Take 1 tablet by mouth daily.   busPIRone  5 MG tablet Commonly known as: BUSPAR  Take 5 mg by mouth 2 (two) times daily as needed (stress).   clonazePAM 0.5 MG tablet Commonly known as: KLONOPIN Take 0.5 mg by mouth 2 (two) times daily as needed for anxiety.   cyanocobalamin  1000 MCG/ML injection Commonly known as: VITAMIN B12 Inject 1,000 mcg into the muscle every 30 (thirty) days.   furosemide  20 MG tablet Commonly known as: LASIX  Take 20 mg by mouth every morning.   hydrALAZINE  25 MG tablet Commonly known as: APRESOLINE  Take 25 mg by mouth 2 (two) times daily.   HYDROmorphone  2 MG tablet Commonly known as: Dilaudid  Take 1 tablet (2 mg total) by mouth every 4 (four) hours as needed for severe pain (for severe pain only and do not take with Hydrocodone ).   ketorolac  10 MG tablet Commonly known as: TORADOL  Take 1 tablet (10 mg total) by mouth every 6 (six) hours as needed.   levocetirizine 5 MG tablet Commonly known as: XYZAL  Take 5 mg by mouth every evening.   levothyroxine  112 MCG tablet Commonly known as: SYNTHROID  Take 112 mcg by mouth daily before breakfast.   losartan  50 MG tablet Commonly known as: COZAAR  Take 0.5 tablets (25 mg total) by mouth daily.   methocarbamol  500 MG  tablet Commonly known as: ROBAXIN  Take 1 tablet (500 mg total) by mouth 2 (two) times daily.   pantoprazole  40 MG tablet Commonly known as: PROTONIX  Take 1 tablet (40 mg total) by mouth daily.   potassium chloride  10 MEQ CR capsule Commonly known as: MICRO-K  Take 10 mEq by mouth daily.   Symbicort 80-4.5 MCG/ACT inhaler Generic drug: budesonide-formoterol Inhale 2 puffs into the lungs 2 (two) times  daily.   Voltaren  1 % Gel Generic drug: diclofenac  Sodium Apply 2 g topically daily as needed (pain).         ROS:  Constitutional: negative for chills, fatigue, and fevers Eyes: negative for visual disturbance and pain Ears, nose, mouth, throat, and face: positive for sinus problems, negative for ear drainage and sore throat Respiratory: negative for cough, wheezing, and shortness of breath Cardiovascular: negative for chest pain and palpitations Gastrointestinal: negative for abdominal pain, nausea, reflux symptoms, and vomiting Genitourinary:negative for dysuria and frequency Integument/breast: negative for dryness and rash Hematologic/lymphatic: negative for bleeding and lymphadenopathy Musculoskeletal:positive for back pain and neck pain Neurological: negative for dizziness and tremors Endocrine: negative for temperature intolerance  Blood pressure (!) 144/82, pulse 73, temperature 98.2 F (36.8 C), temperature source Oral, resp. rate 14, height 5\' 3"  (1.6 m), weight 145 lb (65.8 kg), SpO2 98%. Physical Exam Vitals reviewed.  Constitutional:      Appearance: Normal appearance.  HENT:     Head: Normocephalic and atraumatic.  Eyes:     Extraocular Movements: Extraocular movements intact.     Pupils: Pupils are equal, round, and reactive to light.  Cardiovascular:     Rate and Rhythm: Normal rate and regular rhythm.  Pulmonary:     Effort: Pulmonary effort is normal.     Breath sounds: Normal breath sounds.  Chest:  Breasts:    Right: Mass present. No swelling,  inverted nipple, nipple discharge, skin change or tenderness.     Left: No swelling, inverted nipple, mass, nipple discharge, skin change or tenderness.     Comments: Right breast with a palpable 1 cm area in the 11 o'clock position, no overlying skin dimpling Abdominal:     General: There is no distension.     Palpations: Abdomen is soft.     Tenderness: There is no abdominal tenderness.  Musculoskeletal:        General: Normal range of motion.     Cervical back: Normal range of motion.  Lymphadenopathy:     Upper Body:     Right upper body: No supraclavicular or axillary adenopathy.     Left upper body: No supraclavicular or axillary adenopathy.  Skin:    General: Skin is warm and dry.  Neurological:     General: No focal deficit present.     Mental Status: She is alert and oriented to person, place, and time.  Psychiatric:        Mood and Affect: Mood normal.        Behavior: Behavior normal.     Results: Bilateral diagnostic mammogram (05/24/2023): IMPRESSION: 1. There is a 1.2 cm mass in the right breast at 11 o'clock. This has the appearance of a fibroadenoma, however it is newly palpable to the patient and therefore further evaluation with biopsy is recommended.   2.  No evidence of right axillary lymphadenopathy.   RECOMMENDATION: 1. Ultrasound-guided biopsy is recommended for the right breast mass. The procedure has been scheduled for 06/05/2023 at 1 p.m.   I have discussed the findings and recommendations with the patient. If applicable, a reminder letter will be sent to the patient regarding the next appointment.   BI-RADS CATEGORY  4: Suspicious.  Right breast ultrasound (05/24/2023): IMPRESSION: 1. There is a 1.2 cm mass in the right breast at 11 o'clock. This has the appearance of a fibroadenoma, however it is newly palpable to the patient and therefore further evaluation with biopsy is recommended.   2.  No  evidence of right axillary lymphadenopathy.    RECOMMENDATION: 1. Ultrasound-guided biopsy is recommended for the right breast mass. The procedure has been scheduled for 06/05/2023 at 1 p.m.   I have discussed the findings and recommendations with the patient. If applicable, a reminder letter will be sent to the patient regarding the next appointment.   BI-RADS CATEGORY  4: Suspicious.  Ultrasound guided right breast biopsy (06/05/2023): IMPRESSION: Ultrasound guided biopsy of the mass in the right breast at the 11 o'clock position. No apparent complications.  ADDENDUM: PATHOLOGY revealed: A. BREAST, RIGHT, BIOPSY: Benign breast tissue with dense stromal fibrosis. Negative for atypia or malignancy.   Pathology results are DISCONCORDANT with imaging findings, per Dr. Alger Infield with excision recommended.   Multiple attempts to contact patient were unsuccessful. Ladonna Pickup RN notified provider office on 06/07/2023 and spoke with Jennings Mohr who will notify provider regarding the need to contact patient with biopsy results arrange surgical consultation for patient.   RECOMMENDATION: Surgical consultation for consideration of excision given the location and presence of deforming skin. Request for surgical consultation relayed to True Fuss RT at [facility] on 06/07/2023.  Assessment & Plan:  Jessica Stevens is a 58 y.o. female who presents for evaluation of a right breast mass.  -We discussed the patient's imaging findings and pathology results regarding her right breast mass.  Given her family history of breast cancer and concern for some skin dimpling at the mass location by Dr. Rheba Cedar, decision was made for patient to follow-up with surgery for possible excisional biopsy of her right breast mass. -The risk and benefits of excisional biopsy of right breast mass were discussed including but not limited to bleeding, infection, injury to surrounding structures, need for additional procedures including  sentinel lymph node biopsy or reexcision.  After careful consideration, TASMINE GIESBRECHT has decided to proceed with surgery -Given that the area is palpable, we will plan for excision without any radiofrequency localizer clip placement.  I did discuss with the patient that if we do not get the biopsy clip out at the time of initial surgery, she may need to undergo reexcision of the area to verify we remove the biopsy clip. -We also discussed potential need for further surgeries including sentinel lymph node biopsy in the future pending her final pathology -Patient tentatively scheduled for surgery on 5/15 -Information provided to the patient on lumpectomies and fibroadenomas   All questions were answered to the satisfaction of the patient.  Note: Portions of this report may have been transcribed using voice recognition software. Every effort has been made to ensure accuracy; however, inadvertent computerized transcription errors may still be present.   Lidia Reels, DO [facility] [address]ise Barlow [place], [place] [address] [phone] (office)

## 2023-07-01 NOTE — Progress Notes (Signed)
 [facility] History and Physical  Reason for Referral: Right breast mass Referring Physician: Wendi Ham, NP  Chief Complaint   New Patient (Initial Visit)     Jessica Stevens is a 58 y.o. female.  HPI: Patient presents for evaluation of a right breast mass.  She first noticed a lump on her right breast starting in March.  She denies any pain associated with this mass, but just wanted the area to be appropriately evaluated.  The patient has no history of any other masses, lumps, bumps, nipple changes or discharge.  She had menarche at age 38, and her first pregnancy at age 68. She is G2P1. She did not breastfeed her children.  She has a history of breast cancer in her sister. She underwent menopause at 54 when she underwent a partial hysterectomy.  She has never had any previous biopsies or concerning areas on mammogram.  She has not had any chest radiation.  Her past surgical history is significant for bilateral breast reduction 3 to 4 years ago and a subsequent gastric bypass surgery a year ago.  Her past medical history is significant for asthma, chronic back pain on Norco, GERD, hypertension, hyperlipidemia, and hypothyroidism.  She denies use of blood thinning medications.   Past Medical History:  Diagnosis Date   Allergy    Anemia    Ankle pain, left    Anxiety    Arthritis of knee, degenerative    Asthma    Carpal tunnel syndrome    Cervical radiculopathy    Chronic back pain    Complete rupture of rotator cuff    Complete rupture of rotator cuff    Depression    GERD (gastroesophageal reflux disease)    Gynecomastia    HNP (herniated nucleus pulposus), cervical    HNP (herniated nucleus pulposus), cervical    Hyperlipidemia    Hypertension    Hypothyroidism    Knee pain    OA (osteoarthritis) of knee    Obesity    Sleep apnea    hx of    Urinary incontinence     Past Surgical History:  Procedure Laterality Date   arthroscopy  left knee   08/14/2008   Dr. Phyllis Breeze    BIOPSY  09/20/2020   Procedure: BIOPSY;  Surgeon: Albertina Hugger, MD;  Location: [facility];  Service: Gastroenterology;;   BREAST BIOPSY Right 06/05/2023   path pending   BREAST BIOPSY Right 06/05/2023   US  RT BREAST BX W LOC DEV 1ST LESION IMG BX SPEC US  GUIDE 06/05/2023 Alger Infield, MD [facility]   COLONOSCOPY N/A 03/11/2014   Procedure: COLONOSCOPY;  Surgeon: Ruby Corporal, MD;  Location: [facility];  Service: Endoscopy;  Laterality: N/A;  730 - moved to 1/13 @ 12:00   COLONOSCOPY     ESOPHAGOGASTRODUODENOSCOPY (EGD) WITH PROPOFOL  N/A 09/20/2020   Procedure: ESOPHAGOGASTRODUODENOSCOPY (EGD) WITH PROPOFOL ;  Surgeon: Albertina Hugger, MD;  Location: [facility];  Service: Gastroenterology;  Laterality: N/A;   GASTRIC BYPASS  05/2021   PARTIAL HYSTERECTOMY     POLYPECTOMY     REVERSE SHOULDER ARTHROPLASTY Right 05/27/2020   Procedure: REVERSE SHOULDER ARTHROPLASTY;  Surgeon: Sammye Cristal, MD;  Location: [facility];  Service: Orthopedics;  Laterality: Right;   TOTAL KNEE ARTHROPLASTY Left 03/24/2022   Procedure: TOTAL KNEE ARTHROPLASTY;  Surgeon: Winston Hawking, MD;  Location: [facility];  Service: Orthopedics;  Laterality: Left;  spinal and general   tube insert in right ear  02/27/2001  UPPER GI ENDOSCOPY N/A 05/30/2021   Procedure: UPPER GI ENDOSCOPY;  Surgeon: Junie Olds, MD;  Location: [facility];  Service: General;  Laterality: N/A;    Family History  Problem Relation Age of Onset   Diabetes Mother    Hypertension Mother    Cancer Father        left nephrectomy    Hypertension Father    Hypertension Sister    Diabetes Maternal Grandmother    Hypertension Maternal Grandmother    Hypertension Maternal Grandfather    Colon cancer Neg Hx    Colon polyps Neg Hx    Esophageal cancer Neg Hx    Rectal cancer Neg Hx    Stomach cancer Neg Hx     Social History   Tobacco Use   Smoking status: Never   Smokeless tobacco: Never   Vaping Use   Vaping status: Never Used  Substance Use Topics   Alcohol use: Not Currently    Comment: occasionally    Drug use: Yes    Types: Marijuana    Comment: once every 2-3 weeks    Medications: I have reviewed the patient's current medications. Allergies as of 06/26/2023       Reactions   Oxycodone -acetaminophen  Rash   Not allergic to tylenol         Medication List        Accurate as of June 26, 2023 11:59 PM. If you have any questions, ask your nurse or doctor.          AIRBORNE PO Take 1 tablet by mouth in the morning and at bedtime.   Hair Skin & Nails Tabs Take 1 tablet by mouth daily.   amLODipine  5 MG tablet Commonly known as: NORVASC  Take 1 tablet by mouth daily.   busPIRone  5 MG tablet Commonly known as: BUSPAR  Take 5 mg by mouth 2 (two) times daily as needed (stress).   clonazePAM 0.5 MG tablet Commonly known as: KLONOPIN Take 0.5 mg by mouth 2 (two) times daily as needed for anxiety.   cyanocobalamin  1000 MCG/ML injection Commonly known as: VITAMIN B12 Inject 1,000 mcg into the muscle every 30 (thirty) days.   furosemide  20 MG tablet Commonly known as: LASIX  Take 20 mg by mouth every morning.   hydrALAZINE  25 MG tablet Commonly known as: APRESOLINE  Take 25 mg by mouth 2 (two) times daily.   HYDROmorphone  2 MG tablet Commonly known as: Dilaudid  Take 1 tablet (2 mg total) by mouth every 4 (four) hours as needed for severe pain (for severe pain only and do not take with Hydrocodone ).   ketorolac  10 MG tablet Commonly known as: TORADOL  Take 1 tablet (10 mg total) by mouth every 6 (six) hours as needed.   levocetirizine 5 MG tablet Commonly known as: XYZAL  Take 5 mg by mouth every evening.   levothyroxine  112 MCG tablet Commonly known as: SYNTHROID  Take 112 mcg by mouth daily before breakfast.   losartan  50 MG tablet Commonly known as: COZAAR  Take 0.5 tablets (25 mg total) by mouth daily.   methocarbamol  500 MG  tablet Commonly known as: ROBAXIN  Take 1 tablet (500 mg total) by mouth 2 (two) times daily.   pantoprazole  40 MG tablet Commonly known as: PROTONIX  Take 1 tablet (40 mg total) by mouth daily.   potassium chloride  10 MEQ CR capsule Commonly known as: MICRO-K  Take 10 mEq by mouth daily.   Symbicort 80-4.5 MCG/ACT inhaler Generic drug: budesonide-formoterol Inhale 2 puffs into the lungs 2 (two) times  daily.   Voltaren  1 % Gel Generic drug: diclofenac  Sodium Apply 2 g topically daily as needed (pain).         ROS:  Constitutional: negative for chills, fatigue, and fevers Eyes: negative for visual disturbance and pain Ears, nose, mouth, throat, and face: positive for sinus problems, negative for ear drainage and sore throat Respiratory: negative for cough, wheezing, and shortness of breath Cardiovascular: negative for chest pain and palpitations Gastrointestinal: negative for abdominal pain, nausea, reflux symptoms, and vomiting Genitourinary:negative for dysuria and frequency Integument/breast: negative for dryness and rash Hematologic/lymphatic: negative for bleeding and lymphadenopathy Musculoskeletal:positive for back pain and neck pain Neurological: negative for dizziness and tremors Endocrine: negative for temperature intolerance  Blood pressure (!) 144/82, pulse 73, temperature 98.2 F (36.8 C), temperature source Oral, resp. rate 14, height 5\' 3"  (1.6 m), weight 145 lb (65.8 kg), SpO2 98%. Physical Exam Vitals reviewed.  Constitutional:      Appearance: Normal appearance.  HENT:     Head: Normocephalic and atraumatic.  Eyes:     Extraocular Movements: Extraocular movements intact.     Pupils: Pupils are equal, round, and reactive to light.  Cardiovascular:     Rate and Rhythm: Normal rate and regular rhythm.  Pulmonary:     Effort: Pulmonary effort is normal.     Breath sounds: Normal breath sounds.  Chest:  Breasts:    Right: Mass present. No swelling,  inverted nipple, nipple discharge, skin change or tenderness.     Left: No swelling, inverted nipple, mass, nipple discharge, skin change or tenderness.     Comments: Right breast with a palpable 1 cm area in the 11 o'clock position, no overlying skin dimpling Abdominal:     General: There is no distension.     Palpations: Abdomen is soft.     Tenderness: There is no abdominal tenderness.  Musculoskeletal:        General: Normal range of motion.     Cervical back: Normal range of motion.  Lymphadenopathy:     Upper Body:     Right upper body: No supraclavicular or axillary adenopathy.     Left upper body: No supraclavicular or axillary adenopathy.  Skin:    General: Skin is warm and dry.  Neurological:     General: No focal deficit present.     Mental Status: She is alert and oriented to person, place, and time.  Psychiatric:        Mood and Affect: Mood normal.        Behavior: Behavior normal.     Results: Bilateral diagnostic mammogram (05/24/2023): IMPRESSION: 1. There is a 1.2 cm mass in the right breast at 11 o'clock. This has the appearance of a fibroadenoma, however it is newly palpable to the patient and therefore further evaluation with biopsy is recommended.   2.  No evidence of right axillary lymphadenopathy.   RECOMMENDATION: 1. Ultrasound-guided biopsy is recommended for the right breast mass. The procedure has been scheduled for 06/05/2023 at 1 p.m.   I have discussed the findings and recommendations with the patient. If applicable, a reminder letter will be sent to the patient regarding the next appointment.   BI-RADS CATEGORY  4: Suspicious.  Right breast ultrasound (05/24/2023): IMPRESSION: 1. There is a 1.2 cm mass in the right breast at 11 o'clock. This has the appearance of a fibroadenoma, however it is newly palpable to the patient and therefore further evaluation with biopsy is recommended.   2.  No  evidence of right axillary lymphadenopathy.    RECOMMENDATION: 1. Ultrasound-guided biopsy is recommended for the right breast mass. The procedure has been scheduled for 06/05/2023 at 1 p.m.   I have discussed the findings and recommendations with the patient. If applicable, a reminder letter will be sent to the patient regarding the next appointment.   BI-RADS CATEGORY  4: Suspicious.  Ultrasound guided right breast biopsy (06/05/2023): IMPRESSION: Ultrasound guided biopsy of the mass in the right breast at the 11 o'clock position. No apparent complications.  ADDENDUM: PATHOLOGY revealed: A. BREAST, RIGHT, BIOPSY: Benign breast tissue with dense stromal fibrosis. Negative for atypia or malignancy.   Pathology results are DISCONCORDANT with imaging findings, per Dr. Alger Infield with excision recommended.   Multiple attempts to contact patient were unsuccessful. Ladonna Pickup RN notified provider office on 06/07/2023 and spoke with Jennings Mohr who will notify provider regarding the need to contact patient with biopsy results arrange surgical consultation for patient.   RECOMMENDATION: Surgical consultation for consideration of excision given the location and presence of deforming skin. Request for surgical consultation relayed to True Fuss RT at [facility] on 06/07/2023.  Assessment & Plan:  Jessica Stevens is a 58 y.o. female who presents for evaluation of a right breast mass.  -We discussed the patient's imaging findings and pathology results regarding her right breast mass.  Given her family history of breast cancer and concern for some skin dimpling at the mass location by Dr. Rheba Cedar, decision was made for patient to follow-up with surgery for possible excisional biopsy of her right breast mass. -The risk and benefits of excisional biopsy of right breast mass were discussed including but not limited to bleeding, infection, injury to surrounding structures, need for additional procedures including  sentinel lymph node biopsy or reexcision.  After careful consideration, Jessica Stevens has decided to proceed with surgery -Given that the area is palpable, we will plan for excision without any radiofrequency localizer clip placement.  I did discuss with the patient that if we do not get the biopsy clip out at the time of initial surgery, she may need to undergo reexcision of the area to verify we remove the biopsy clip. -We also discussed potential need for further surgeries including sentinel lymph node biopsy in the future pending her final pathology -Patient tentatively scheduled for surgery on 5/15 -Information provided to the patient on lumpectomies and fibroadenomas   All questions were answered to the satisfaction of the patient.  Note: Portions of this report may have been transcribed using voice recognition software. Every effort has been made to ensure accuracy; however, inadvertent computerized transcription errors may still be present.   Lidia Reels, DO [facility] [address]low [place], [place] [address] [phone] (office)

## 2023-07-04 DIAGNOSIS — L987 Excessive and redundant skin and subcutaneous tissue: Secondary | ICD-10-CM | POA: Diagnosis not present

## 2023-07-04 DIAGNOSIS — Z9884 Bariatric surgery status: Secondary | ICD-10-CM | POA: Diagnosis not present

## 2023-07-09 NOTE — Patient Instructions (Addendum)
 Your procedure is scheduled on: 07/12/2023  Report to [facility] Main Entrance at  6:00   AM.  Call this number if you have problems the morning of surgery: [phone]   Remember:   Do not Eat or Drink after midnight         No Smoking the morning of surgery  :  Take these medicines the morning of surgery with A SIP OF WATER : Amlodipine , klonopin, buspar , hydralazine , levothyroxine , and pantoprazole   Use inhalers if needed   Do not wear jewelry, make-up or nail polish.  Do not wear lotions, powders, or perfumes. You may wear deodorant.  Do not shave 48 hours prior to surgery. Men may shave face and neck.  Do not bring valuables to the hospital.  Contacts, dentures or bridgework may not be worn into surgery.  Leave suitcase in the car. After surgery it may be brought to your room.  For patients admitted to the hospital, checkout time is 11:00 AM the day of discharge.   Patients discharged the day of surgery will not be allowed to drive home.    Special Instructions: Shower using CHG night before surgery and shower the day of surgery use CHG.  Use special wash - you have one bottle of CHG for all showers.  You should use approximately 1/2 of the bottle for each shower. How to Use Chlorhexidine  at Home in the Shower Chlorhexidine  gluconate (CHG) is a germ-killing (antiseptic) wash that's used to clean the skin. It can get rid of the germs that normally live on the skin and can keep them away for about 24 hours. If you're having surgery, you may be told to shower with CHG at home the night before surgery. This can help lower your risk for infection. To use CHG wash in the shower, follow the steps below. Supplies needed: CHG body wash. Clean washcloth. Clean towel. How to use CHG in the shower Follow these steps unless you're told to use CHG in a different way: Start the shower. Use your normal soap and shampoo to wash your face and hair. Turn off the shower or move out of the  shower stream. Pour CHG onto a clean washcloth. Do not use any type of brush or rough sponge. Start at your neck, washing your body down to your toes. Make sure you: Wash the part of your body where the surgery will be done for at least 1 minute. Do not scrub. Do not use CHG on your head or face unless your health care provider tells you to. If it gets into your ears or eyes, rinse them well with water . Do not wash your genitals with CHG. Wash your back and under your arms. Make sure to wash skin folds. Let the CHG sit on your skin for 1-2 minutes or as long as told. Rinse your entire body in the shower, including all body creases and folds. Turn off the shower. Dry off with a clean towel. Do not put anything on your skin afterward, such as powder, lotion, or perfume. Put on clean clothes or pajamas. If it's the night before surgery, sleep in clean sheets. General tips Use CHG only as told, and follow the instructions on the label. Use the full amount of CHG as told. This is often one bottle. Do not smoke and stay away from flames after using CHG. Your skin may feel sticky after using CHG. This is normal. The sticky feeling will go away as the CHG dries. Do not  use CHG: If you have a chlorhexidine  allergy or have reacted to chlorhexidine  in the past. On open wounds or areas of skin that have broken skin, cuts, or scrapes. On babies younger than 78 months of age. Contact a health care provider if: You have questions about using CHG. Your skin gets irritated or itchy. You have a rash after using CHG. You swallow any CHG. Call your local poison control center [phone] in the [place] Your eyes itch badly, or they become very red or swollen. Your hearing changes. You have trouble seeing. If you can't reach your provider, go to an urgent care or emergency room. Do not drive yourself. Get help right away if: You have swelling or tingling in your mouth or throat. You make high-pitched  whistling sounds when you breathe, most often when you breathe out (wheeze). You have trouble breathing. These symptoms may be an emergency. Call 911 right away. Do not wait to see if the symptoms will go away. Do not drive yourself to the hospital. This information is not intended to replace advice given to you by your health care provider. Make sure you discuss any questions you have with your health care provider. Document Revised: 08/29/2022 Document Reviewed: 08/25/2021 Elsevier Patient Education  2024 [employer] Breast Biopsy, Care After The following information offers guidance on how to care for yourself after your breast biopsy. Your doctor may also give you more specific instructions. If you have problems or questions, contact your doctor. What can I expect after the procedure? After a breast biopsy, it is common to have: Bruising on your breast. Breast swelling. Numbness, tingling, or pain near your biopsy site. This site is where tissue was taken out for study. Follow these instructions at home: Medicines Take over-the-counter and prescription medicines only as told by your doctor. If you were given a sedative during your procedure, do not drive or use machines until your doctor says that it is safe. A sedative is a medicine that helps you relax. Do not drink alcohol while taking pain medicine. Ask your doctor if you should avoid driving or using machines while you are taking your medicine. Biopsy site care     Follow instructions from your doctor about how to take care of your cut from surgery (incision) or your puncture site. Make sure you: Wash your hands with soap and water  for at least 20 seconds before and after you change your bandage. If you cannot use soap and water , use hand sanitizer. Change your bandage. Leave stitches or skin glue in place for at least 2 weeks. Leave tape strips alone unless you are told to take them off. You may trim the edges of the tape  strips if they curl up. If you have stitches, keep them dry when you take a bath or a shower. Check your cut or puncture site every day for signs of infection. Look for: More redness, swelling, or pain. More fluid or blood. Warmth. Pus or a bad smell. Protect the biopsy site. Do not let the site get bumped. Managing pain If told, put ice on the biopsy site. To do this: Put ice in a plastic bag. Place a towel between your skin and the bag. Leave the ice on for 20 minutes, 2-3 times a day. Take off the ice if your skin turns bright red. This is very important. If you cannot feel pain, heat, or cold, you have a greater risk of damage to the area. Activity If a cut was  made in your skin to do the biopsy, avoid activities that could pull your cut open. These include: Stretching. Reaching over your head. Exercise. Sports. Lifting anything that weighs more than 3 lb (1.4 kg). Return to your normal activities when your doctor says that it is safe. General instructions Follow your normal diet. Wear a good support bra for as long as told by your doctor. Get checked for extra fluid around your lymph nodes (lymphedema) as often as told. Do not smoke or use any products that contain nicotine or tobacco. If you need help quitting, ask your doctor. Keep all follow-up visits. Contact a doctor if: You notice any of these at or near the biopsy site: More redness, swelling, or pain. More fluid or blood. Warmth. Pus or a bad smell. The site breaking open after the stitches or skin tape strips have been removed. You have a rash or a fever. Get help right away if: You have trouble breathing. You have red streaks around the biopsy site. Summary After a breast biopsy, it is common to have bruising, numbness, tingling, or pain near your biopsy site. Ask your doctor if you should avoid driving or using machines while you are taking your medicine. If you had a cut made in your skin to do the biopsy,  avoid activities that may pull the cut open. Return to your normal activities when your doctor says that it is safe. Wear a good support bra for as long as told by your doctor. This information is not intended to replace advice given to you by your health care provider. Make sure you discuss any questions you have with your health care provider. Document Revised: 12/08/2020 Document Reviewed: 12/08/2020 Elsevier Patient Education  2024 [employer]. General Anesthesia, Adult, Care After The following information offers guidance on how to care for yourself after your procedure. Your health care provider may also give you more specific instructions. If you have problems or questions, contact your health care provider. What can I expect after the procedure? After the procedure, it is common for people to: Have pain or discomfort at the IV site. Have nausea or vomiting. Have a sore throat or hoarseness. Have trouble concentrating. Feel cold or chills. Feel weak, sleepy, or tired (fatigue). Have soreness and body aches. These can affect parts of the body that were not involved in surgery. Follow these instructions at home: For the time period you were told by your health care provider:  Rest. Do not participate in activities where you could fall or become injured. Do not drive or use machinery. Do not drink alcohol. Do not take sleeping pills or medicines that cause drowsiness. Do not make important decisions or sign legal documents. Do not take care of children on your own. General instructions Drink enough fluid to keep your urine pale yellow. If you have sleep apnea, surgery and certain medicines can increase your risk for breathing problems. Follow instructions from your health care provider about wearing your sleep device: Anytime you are sleeping, including during daytime naps. While taking prescription pain medicines, sleeping medicines, or medicines that make you drowsy. Return to  your normal activities as told by your health care provider. Ask your health care provider what activities are safe for you. Take over-the-counter and prescription medicines only as told by your health care provider. Do not use any products that contain nicotine or tobacco. These products include cigarettes, chewing tobacco, and vaping devices, such as e-cigarettes. These can delay incision healing after surgery.  If you need help quitting, ask your health care provider. Contact a health care provider if: You have nausea or vomiting that does not get better with medicine. You vomit every time you eat or drink. You have pain that does not get better with medicine. You cannot urinate or have bloody urine. You develop a skin rash. You have a fever. Get help right away if: You have trouble breathing. You have chest pain. You vomit blood. These symptoms may be an emergency. Get help right away. Call 911. Do not wait to see if the symptoms will go away. Do not drive yourself to the hospital. Summary After the procedure, it is common to have a sore throat, hoarseness, nausea, vomiting, or to feel weak, sleepy, or fatigue. For the time period you were told by your health care provider, do not drive or use machinery. Get help right away if you have difficulty breathing, have chest pain, or vomit blood. These symptoms may be an emergency. This information is not intended to replace advice given to you by your health care provider. Make sure you discuss any questions you have with your health care provider. Document Revised: 05/13/2021 Document Reviewed: 05/13/2021 Elsevier Patient Education  2024 [employer]. How to Use an Incentive Spirometer An incentive spirometer is a tool that measures how well you are filling your lungs with each breath. Learning to take long, deep breaths using this tool can help you keep your lungs clear and active. This may help to reverse or lessen your chance of developing  breathing (pulmonary) problems, especially infection. You may be asked to use a spirometer: After a surgery. If you have a lung problem or a history of smoking. After a long period of time when you have been unable to move or be active. If the spirometer includes an indicator to show the highest number that you have reached, your health care provider or respiratory therapist will help you set a goal. Keep a log of your progress as told by your health care provider. What are the risks? Breathing too quickly may cause dizziness or cause you to pass out. Take your time so you do not get dizzy or light-headed. If you are in pain, you may need to take pain medicine before doing incentive spirometry. It is harder to take a deep breath if you are having pain. How to use your incentive spirometer  Sit up on the edge of your bed or on a chair. Hold the incentive spirometer so that it is in an upright position. Before you use the spirometer, breathe out normally. Place the mouthpiece in your mouth. Make sure your lips are closed tightly around it. Breathe in slowly and as deeply as you can through your mouth, causing the piston or the ball to rise toward the top of the chamber. Hold your breath for 3-5 seconds, or for as long as possible. If the spirometer includes a coach indicator, use this to guide you in breathing. Slow down your breathing if the indicator goes above the marked areas. Remove the mouthpiece from your mouth and breathe out normally. The piston or ball will return to the bottom of the chamber. Rest for a few seconds, then repeat the steps 10 or more times. Take your time and take a few normal breaths between deep breaths so that you do not get dizzy or light-headed. Do this every 1-2 hours when you are awake. If the spirometer includes a goal marker to show the highest number  you have reached (best effort), use this as a goal to work toward during each repetition. After each set of 10  deep breaths, cough a few times. This will help to make sure that your lungs are clear. If you have an incision on your chest or abdomen from surgery, place a pillow or a rolled-up towel firmly against the incision when you cough. This can help to reduce pain while taking deep breaths and coughing. General tips When you are able to get out of bed: Walk around often. Continue to take deep breaths and cough in order to clear your lungs. Keep using the incentive spirometer until your health care provider says it is okay to stop using it. If you have been in the hospital, you may be told to keep using the spirometer at home. Contact a health care provider if: You are having difficulty using the spirometer. You have trouble using the spirometer as often as instructed. Your pain medicine is not giving enough relief for you to use the spirometer as told. You have a fever. Get help right away if: You develop shortness of breath. You develop a cough with bloody mucus from the lungs. You have fluid or blood coming from an incision site after you cough. Summary An incentive spirometer is a tool that can help you learn to take long, deep breaths to keep your lungs clear and active. You may be asked to use a spirometer after a surgery, if you have a lung problem or a history of smoking, or if you have been inactive for a long period of time. Use your incentive spirometer as instructed every 1-2 hours while you are awake. If you have an incision on your chest or abdomen, place a pillow or a rolled-up towel firmly against your incision when you cough. This will help to reduce pain. Get help right away if you have shortness of breath, you cough up bloody mucus, or blood comes from your incision when you cough. This information is not intended to replace advice given to you by your health care provider. Make sure you discuss any questions you have with your health care provider. Document Revised: 12/22/2022  Document Reviewed: 12/22/2022 Elsevier Patient Education  2024 [employer].

## 2023-07-10 ENCOUNTER — Encounter (HOSPITAL_COMMUNITY)
Admission: RE | Admit: 2023-07-10 | Discharge: 2023-07-10 | Disposition: A | Discharge status: Home / Self Care | Source: Ambulatory Visit | Attending: Surgery | Admitting: Surgery

## 2023-07-10 DIAGNOSIS — Z01818 Encounter for other preprocedural examination: Secondary | ICD-10-CM

## 2023-07-10 DIAGNOSIS — D649 Anemia, unspecified: Secondary | ICD-10-CM

## 2023-07-11 ENCOUNTER — Encounter (HOSPITAL_COMMUNITY)
Admission: RE | Admit: 2023-07-11 | Discharge: 2023-07-11 | Disposition: A | Discharge status: Home / Self Care | Source: Ambulatory Visit | Attending: Surgery | Admitting: Surgery

## 2023-07-11 ENCOUNTER — Encounter (HOSPITAL_COMMUNITY): Payer: Self-pay

## 2023-07-11 DIAGNOSIS — Z01818 Encounter for other preprocedural examination: Secondary | ICD-10-CM

## 2023-07-11 DIAGNOSIS — Z01812 Encounter for preprocedural laboratory examination: Secondary | ICD-10-CM | POA: Diagnosis not present

## 2023-07-11 DIAGNOSIS — D649 Anemia, unspecified: Secondary | ICD-10-CM | POA: Diagnosis not present

## 2023-07-11 LAB — CBC WITH DIFFERENTIAL/PLATELET
Abs Immature Granulocytes: 0 10*3/uL (ref 0.00–0.07)
Basophils Absolute: 0 10*3/uL (ref 0.0–0.1)
Basophils Relative: 1 %
Eosinophils Absolute: 0.1 10*3/uL (ref 0.0–0.5)
Eosinophils Relative: 2 %
HCT: 37.1 % (ref 36.0–46.0)
Hemoglobin: 12.1 g/dL (ref 12.0–15.0)
Immature Granulocytes: 0 %
Lymphocytes Relative: 45 %
Lymphs Abs: 2.4 10*3/uL (ref 0.7–4.0)
MCH: 30.1 pg (ref 26.0–34.0)
MCHC: 32.6 g/dL (ref 30.0–36.0)
MCV: 92.3 fL (ref 80.0–100.0)
Monocytes Absolute: 0.4 10*3/uL (ref 0.1–1.0)
Monocytes Relative: 7 %
Neutro Abs: 2.4 10*3/uL (ref 1.7–7.7)
Neutrophils Relative %: 45 %
Platelets: 283 10*3/uL (ref 150–400)
RBC: 4.02 MIL/uL (ref 3.87–5.11)
RDW: 15.7 % — ABNORMAL HIGH (ref 11.5–15.5)
WBC: 5.2 10*3/uL (ref 4.0–10.5)
nRBC: 0 % (ref 0.0–0.2)

## 2023-07-11 LAB — BASIC METABOLIC PANEL WITH GFR
Anion gap: 11 (ref 5–15)
BUN: 12 mg/dL (ref 6–20)
CO2: 23 mmol/L (ref 22–32)
Calcium: 9.6 mg/dL (ref 8.9–10.3)
Chloride: 107 mmol/L (ref 98–111)
Creatinine, Ser: 0.8 mg/dL (ref 0.44–1.00)
GFR, Estimated: >60 mL/min (ref >60)
Glucose, Bld: 71 mg/dL (ref 70–99)
Potassium: 3.9 mmol/L (ref 3.5–5.1)
Sodium: 141 mmol/L (ref 135–145)

## 2023-07-12 ENCOUNTER — Ambulatory Visit (HOSPITAL_BASED_OUTPATIENT_CLINIC_OR_DEPARTMENT_OTHER): Admitting: Certified Registered Nurse Anesthetist

## 2023-07-12 ENCOUNTER — Encounter (HOSPITAL_COMMUNITY): Payer: Self-pay | Admitting: Surgery

## 2023-07-12 ENCOUNTER — Other Ambulatory Visit: Payer: Self-pay

## 2023-07-12 ENCOUNTER — Encounter (HOSPITAL_COMMUNITY)
Admission: RE | Disposition: A | Discharge status: Home / Self Care | Payer: Self-pay | Source: Home / Self Care | Attending: Surgery

## 2023-07-12 ENCOUNTER — Ambulatory Visit (HOSPITAL_COMMUNITY)

## 2023-07-12 ENCOUNTER — Ambulatory Visit (HOSPITAL_COMMUNITY)
Admission: RE | Admit: 2023-07-12 | Discharge: 2023-07-12 | Disposition: A | Discharge status: Home / Self Care | Attending: Surgery | Admitting: Surgery

## 2023-07-12 ENCOUNTER — Ambulatory Visit (HOSPITAL_COMMUNITY): Admitting: Certified Registered Nurse Anesthetist

## 2023-07-12 DIAGNOSIS — E785 Hyperlipidemia, unspecified: Secondary | ICD-10-CM

## 2023-07-12 DIAGNOSIS — Z803 Family history of malignant neoplasm of breast: Secondary | ICD-10-CM | POA: Insufficient documentation

## 2023-07-12 DIAGNOSIS — N63 Unspecified lump in unspecified breast: Secondary | ICD-10-CM | POA: Diagnosis not present

## 2023-07-12 DIAGNOSIS — N6310 Unspecified lump in the right breast, unspecified quadrant: Secondary | ICD-10-CM | POA: Insufficient documentation

## 2023-07-12 DIAGNOSIS — K219 Gastro-esophageal reflux disease without esophagitis: Secondary | ICD-10-CM | POA: Insufficient documentation

## 2023-07-12 DIAGNOSIS — I10 Essential (primary) hypertension: Secondary | ICD-10-CM | POA: Diagnosis not present

## 2023-07-12 DIAGNOSIS — E039 Hypothyroidism, unspecified: Secondary | ICD-10-CM | POA: Diagnosis not present

## 2023-07-12 DIAGNOSIS — Z8249 Family history of ischemic heart disease and other diseases of the circulatory system: Secondary | ICD-10-CM | POA: Diagnosis not present

## 2023-07-12 DIAGNOSIS — G8929 Other chronic pain: Secondary | ICD-10-CM | POA: Diagnosis not present

## 2023-07-12 DIAGNOSIS — J45909 Unspecified asthma, uncomplicated: Secondary | ICD-10-CM | POA: Insufficient documentation

## 2023-07-12 DIAGNOSIS — F419 Anxiety disorder, unspecified: Secondary | ICD-10-CM | POA: Diagnosis not present

## 2023-07-12 DIAGNOSIS — M549 Dorsalgia, unspecified: Secondary | ICD-10-CM | POA: Insufficient documentation

## 2023-07-12 DIAGNOSIS — N6031 Fibrosclerosis of right breast: Secondary | ICD-10-CM | POA: Diagnosis not present

## 2023-07-12 DIAGNOSIS — M1990 Unspecified osteoarthritis, unspecified site: Secondary | ICD-10-CM | POA: Insufficient documentation

## 2023-07-12 DIAGNOSIS — N6011 Diffuse cystic mastopathy of right breast: Secondary | ICD-10-CM | POA: Diagnosis not present

## 2023-07-12 HISTORY — PX: BREAST LUMPECTOMY: SHX2

## 2023-07-12 SURGERY — BREAST LUMPECTOMY
Anesthesia: General | Site: Breast | Laterality: Right

## 2023-07-12 MED ORDER — PROPOFOL 10 MG/ML IV BOLUS
INTRAVENOUS | Status: AC
  Filled 2023-07-12: qty 20

## 2023-07-12 MED ORDER — ONDANSETRON HCL 4 MG/2ML IJ SOLN
INTRAMUSCULAR | Status: AC
  Filled 2023-07-12: qty 2

## 2023-07-12 MED ORDER — SODIUM CHLORIDE 0.9 % IR SOLN
Status: DC | PRN
  Administered 2023-07-12: 1000 mL

## 2023-07-12 MED ORDER — DEXAMETHASONE SODIUM PHOSPHATE 10 MG/ML IJ SOLN
INTRAMUSCULAR | Status: AC
  Filled 2023-07-12: qty 1

## 2023-07-12 MED ORDER — FENTANYL CITRATE (PF) 100 MCG/2ML IJ SOLN
INTRAMUSCULAR | Status: AC
Start: 2023-07-12 — End: ?
  Filled 2023-07-12: qty 2

## 2023-07-12 MED ORDER — DEXMEDETOMIDINE HCL IN NACL 80 MCG/20ML IV SOLN
INTRAVENOUS | Status: AC
  Filled 2023-07-12: qty 20

## 2023-07-12 MED ORDER — LIDOCAINE 2% (20 MG/ML) 5 ML SYRINGE
INTRAMUSCULAR | Status: AC
  Filled 2023-07-12: qty 5

## 2023-07-12 MED ORDER — MIDAZOLAM HCL 2 MG/2ML IJ SOLN
INTRAMUSCULAR | Status: DC | PRN
  Administered 2023-07-12: 2 mg via INTRAVENOUS

## 2023-07-12 MED ORDER — FENTANYL CITRATE (PF) 100 MCG/2ML IJ SOLN
INTRAMUSCULAR | Status: AC
  Filled 2023-07-12: qty 2

## 2023-07-12 MED ORDER — BUPIVACAINE HCL (PF) 0.5 % IJ SOLN
INTRAMUSCULAR | Status: AC
Start: 2023-07-12 — End: ?
  Filled 2023-07-12: qty 30

## 2023-07-12 MED ORDER — EPHEDRINE 5 MG/ML INJ
INTRAVENOUS | Status: AC
  Filled 2023-07-12: qty 5

## 2023-07-12 MED ORDER — LACTATED RINGERS IV SOLN: INTRAVENOUS | Status: DC | PRN

## 2023-07-12 MED ORDER — ACETAMINOPHEN 500 MG PO TABS: 500.0000 mg | ORAL_TABLET | Freq: Four times a day (QID) | ORAL | 0 refills | Status: AC

## 2023-07-12 MED ORDER — HYDROCODONE-ACETAMINOPHEN 5-325 MG PO TABS: 1.0000 | ORAL_TABLET | Freq: Four times a day (QID) | ORAL | 0 refills | Status: AC | PRN

## 2023-07-12 MED ORDER — CEFAZOLIN SODIUM-DEXTROSE 2-4 GM/100ML-% IV SOLN
2.0000 g | INTRAVENOUS | Status: AC
  Administered 2023-07-12: 2 g via INTRAVENOUS
  Filled 2023-07-12: qty 100

## 2023-07-12 MED ORDER — LIDOCAINE 2% (20 MG/ML) 5 ML SYRINGE
INTRAMUSCULAR | Status: DC | PRN
  Administered 2023-07-12: 60 mg via INTRAVENOUS

## 2023-07-12 MED ORDER — EPHEDRINE SULFATE-NACL 50-0.9 MG/10ML-% IV SOSY
PREFILLED_SYRINGE | INTRAVENOUS | Status: DC | PRN
Start: 2023-07-12 — End: 2023-07-12
  Administered 2023-07-12 (×2): 10 mg via INTRAVENOUS
  Administered 2023-07-12: 5 mg via INTRAVENOUS

## 2023-07-12 MED ORDER — ROCURONIUM BROMIDE 10 MG/ML (PF) SYRINGE
PREFILLED_SYRINGE | INTRAVENOUS | Status: DC | PRN
  Administered 2023-07-12: 50 mg via INTRAVENOUS

## 2023-07-12 MED ORDER — FENTANYL CITRATE (PF) 100 MCG/2ML IJ SOLN
INTRAMUSCULAR | Status: DC | PRN
  Administered 2023-07-12: 50 ug via INTRAVENOUS
  Administered 2023-07-12: 75 ug via INTRAVENOUS
  Administered 2023-07-12: 25 ug via INTRAVENOUS
  Administered 2023-07-12: 50 ug via INTRAVENOUS

## 2023-07-12 MED ORDER — CHLORHEXIDINE GLUCONATE CLOTH 2 % EX PADS: 6.0000 | MEDICATED_PAD | Freq: Once | CUTANEOUS | Status: DC

## 2023-07-12 MED ORDER — MIDAZOLAM HCL 2 MG/2ML IJ SOLN
INTRAMUSCULAR | Status: AC
  Filled 2023-07-12: qty 2

## 2023-07-12 MED ORDER — CHLORHEXIDINE GLUCONATE 0.12 % MT SOLN
15.0000 mL | Freq: Once | OROMUCOSAL | Status: AC
  Administered 2023-07-12: 15 mL via OROMUCOSAL

## 2023-07-12 MED ORDER — DEXAMETHASONE SODIUM PHOSPHATE 10 MG/ML IJ SOLN
INTRAMUSCULAR | Status: DC | PRN
  Administered 2023-07-12: 8 mg via INTRAVENOUS

## 2023-07-12 MED ORDER — BUPIVACAINE HCL (PF) 0.5 % IJ SOLN
INTRAMUSCULAR | Status: DC | PRN
  Administered 2023-07-12: 30 mL

## 2023-07-12 MED ORDER — DEXMEDETOMIDINE HCL IN NACL 80 MCG/20ML IV SOLN
INTRAVENOUS | Status: DC | PRN
Start: 2023-07-12 — End: 2023-07-12
  Administered 2023-07-12: 8 ug via INTRAVENOUS

## 2023-07-12 MED ORDER — ONDANSETRON HCL 4 MG/2ML IJ SOLN
INTRAMUSCULAR | Status: DC | PRN
  Administered 2023-07-12 (×2): 4 mg via INTRAVENOUS

## 2023-07-12 MED ORDER — PROPOFOL 10 MG/ML IV BOLUS
INTRAVENOUS | Status: DC | PRN
Start: 2023-07-12 — End: 2023-07-12
  Administered 2023-07-12: 200 mg via INTRAVENOUS
  Administered 2023-07-12: 50 mg via INTRAVENOUS

## 2023-07-12 MED ORDER — SUGAMMADEX SODIUM 200 MG/2ML IV SOLN
INTRAVENOUS | Status: DC | PRN
Start: 2023-07-12 — End: 2023-07-12
  Administered 2023-07-12: 200 mg via INTRAVENOUS

## 2023-07-12 SURGICAL SUPPLY — 25 items
BLADE SURG 15 STRL LF DISP TIS (BLADE) ×1 IMPLANT
CHLORAPREP W/TINT 26 (MISCELLANEOUS) ×1 IMPLANT
COVER LIGHT HANDLE STERIS (MISCELLANEOUS) ×2 IMPLANT
DERMABOND ADVANCED .7 DNX12 (GAUZE/BANDAGES/DRESSINGS) ×1 IMPLANT
DEVICE DUBIN W/COMP PLATE 8390 (MISCELLANEOUS) IMPLANT
ELECTRODE REM PT RTRN 9FT ADLT (ELECTROSURGICAL) ×1 IMPLANT
GLOVE BIO SURGEON STRL SZ7 (GLOVE) IMPLANT
GLOVE BIOGEL PI IND STRL 6.5 (GLOVE) ×1 IMPLANT
GLOVE BIOGEL PI IND STRL 7.0 (GLOVE) ×2 IMPLANT
GLOVE BIOGEL PI IND STRL 7.5 (GLOVE) IMPLANT
GLOVE SURG SS PI 6.5 STRL IVOR (GLOVE) ×1 IMPLANT
GOWN STRL REUS W/TWL LRG LVL3 (GOWN DISPOSABLE) ×2 IMPLANT
KIT MARKER MARGIN INK (KITS) IMPLANT
KIT TURNOVER KIT A (KITS) ×1 IMPLANT
MANIFOLD NEPTUNE II (INSTRUMENTS) ×1 IMPLANT
NDL HYPO 25X1 1.5 SAFETY (NEEDLE) ×1 IMPLANT
NEEDLE HYPO 25X1 1.5 SAFETY (NEEDLE) ×1 IMPLANT
NS IRRIG 1000ML POUR BTL (IV SOLUTION) ×1 IMPLANT
PACK MINOR (CUSTOM PROCEDURE TRAY) ×1 IMPLANT
PAD ARMBOARD POSITIONER FOAM (MISCELLANEOUS) ×1 IMPLANT
POSITIONER HEAD 8X9X4 ADT (SOFTGOODS) ×1 IMPLANT
SET BASIN LINEN APH (SET/KITS/TRAYS/PACK) ×1 IMPLANT
SUT MNCRL AB 4-0 PS2 18 (SUTURE) ×1 IMPLANT
SUT VIC AB 3-0 SH 27X BRD (SUTURE) IMPLANT
SYR 30ML LL (SYRINGE) ×2 IMPLANT

## 2023-07-12 NOTE — Transfer of Care (Addendum)
 Immediate Anesthesia Transfer of Care Note  Patient: Jessica Stevens  Procedure(s) Performed: Excisional Biopsy of Right Breast Mass (Right: Breast)  Patient Location: PACU  Anesthesia Type:General  Level of Consciousness: drowsy and patient cooperative  Airway & Oxygen Therapy: Patient Spontanous Breathing and Patient connected to face mask oxygen  Post-op Assessment: Report given to RN and Post -op Vital signs reviewed and stable  Post vital signs: Reviewed and stable  Last Vitals:  Vitals Value Taken Time  BP 132/66 07/12/23   0925  Temp 36.4 07/12/23   0925  Pulse 67 07/12/23 0925  Resp 15 07/12/23 0925  SpO2 100 % 07/12/23 0925  Vitals shown include unfiled device data.  Last Pain:  Vitals:   07/12/23 0636  TempSrc: Oral  PainSc:          Complications: No notable events documented.

## 2023-07-12 NOTE — Progress Notes (Signed)
 [facility]  Spoke with the patient's sister in the consultation room.  I explained that she tolerated the procedure without difficulty.  She has dissolvable stitches under the skin with overlying skin glue.  This will flake off in 10 to 14 days.  I discharged her home with a prescription for narcotic pain medication that they should take as needed for pain.  I also want her taking scheduled Tylenol .  If they take the narcotic pain medication, they should take a stool softener as well.  The patient will follow-up with me in 2 weeks.  All questions were answered to her expressed satisfaction.  Lidia Reels, DO [facility] [address] [place] [place], [place] [address] [phone] (office)

## 2023-07-12 NOTE — Op Note (Signed)
 [facility] Operative Note  07/12/23  Preoperative Diagnosis: Right breast mass   Postoperative Diagnosis: Same   Procedure(s) Performed: Excisional biopsy of right breast mass   Surgeon: Lidia Reels, DO    Assistants: Lerry Ransom, MS3  Anesthesia: LMA   Anesthesiologist: Dr. Margrette Shield   Specimens:  Right breast mass on top of right pectoralis major muscle Right breast tissue overlying right breast mass, suture superior   Estimated Blood Loss: Minimal   Blood Replacement: None    Complications: None   Wound Class: Clean   Operative Indications: Patient is a 58 year old female who presents for excisional biopsy of a right breast mass.  She felt the mass, and then underwent mammography.  Mammography and ultrasound recommended biopsy, which came back with benign breast tissue with dense stromal fibrosis, negative for malignancy and atypia.  This was determined to be disconcordant with her imaging, and recommendation was made for excisional biopsy of the right breast mass.  She is agreeable to surgery at this time.  All risks and benefits of performing this procedure were discussed with the patient including pain, infection, bleeding, damage to the surrounding structures, and need for more procedures or surgery. The patient voiced understanding of the procedure, all questions were sought and answered, and consent was obtained.  Findings: Palpable right breast mass on top of the lateral edge of the right pectoralis major muscle   Procedure: The patient was taken to the operating room and placed supine. General endotracheal anesthesia was induced. Intravenous antibiotics were administered per protocol.   The breast, chest wall, axilla, and upper arm and neck were prepped and draped in the usual sterile fashion. A time-out was completed verifying correct patient, procedure, site, positioning, and implant(s) and/or special equipment prior to beginning this  procedure.  An incision was made overlying the palpable right breast mass, and flaps were raised.  The mass was noted to be on top of the right pectoralis major muscle.  This mass was removed in its entirety.  Decision was made to remove the breast tissue overlying the right breast mass to verify the biopsy clip was removed with the specimens.  The specimens were oriented using our painting system, and then sent to mammography to verify the biopsy clip was present within the specimen. The specimens were then sent to pathology for evaluation.  Electrocautery was used to achieve hemostasis.  Marcaine  was instilled throughout the incision.  The breast tissue was approximated to cover the pectoralis muscle.  The dermis was then reapproximated with interrupted 3-0 Vicryl sutures in the dermis.  The skin was closed with 4-0 Monocryl in a subcuticular fashion.  Dermabond was applied.  Compression bra was placed in PACU.  Final inspection revealed acceptable hemostasis. All counts were correct at the end of the case. The patient was awakened from anesthesia without complication.  The patient went to the PACU in stable condition.   Lidia Reels, DO  [facility] [address] [place], [place] [address] [phone] (office)

## 2023-07-12 NOTE — Interval H&P Note (Signed)
 History and Physical Interval Note:  07/12/2023 7:23 AM  Jessica Stevens  has presented today for surgery, with the diagnosis of BREAST MASS, RIGHT, BENIGN.  The various methods of treatment have been discussed with the patient and family. After consideration of risks, benefits and other options for treatment, the patient has consented to  Procedure(s): BREAST LUMPECTOMY (Right) as a surgical intervention.  The patient's history has been reviewed, patient examined, no change in status, stable for surgery.  I have reviewed the patient's chart and labs.  Questions were answered to the patient's satisfaction.     Hannalee Castor A Tearsa Kowalewski

## 2023-07-12 NOTE — Discharge Instructions (Signed)
 Ambulatory Surgery Discharge Instructions  General Anesthesia or Sedation Do not drive or operate heavy machinery for 24 hours.  Do not consume alcohol, tranquilizers, sleeping medications, or any non-prescribed medications for 24 hours. Do not make important decisions or sign any important papers in the next 24 hours. You should have someone with you tonight at home.  Activity  You are advised to go directly home from the hospital.  Restrict your activities and rest for a day.  Resume light activity tomorrow. No heavy lifting over 10 lbs or strenuous exercise. Wear a compression bra while up and moving for the first 2 weeks after surgery  Fluids and Diet Begin with clear liquids, bouillon, dry toast, soda crackers.  If not nauseated, you may go to a regular diet when you desire.  Greasy and spicy foods are not advised.  Medications  If you have not had a bowel movement in 24 hours, take 2 tablespoons over the counter Milk of mag.             You May resume your blood thinners tomorrow (Aspirin , coumadin, or other).  You are being discharged with prescriptions for Opioid/Narcotic Medications: There are some specific considerations for these medications that you should know. Opioid Meds have risks & benefits. Addiction to these meds is always a concern with prolonged use Take medication only as directed Do not drive while taking narcotic pain medication Do not crush tablets or capsules Do not use a different container than medication was dispensed in Lock the container of medication in a cool, dry place out of reach of children and pets. Opioid medication can cause addiction Do not share with anyone else (this is a felony) Do not store medications for future use. Dispose of them properly.     Disposal:  Find a [facility]  household drug take back site near you.  If you can't get to a drug take back site, use the recipe below as a last resort to dispose of expired, unused or unwanted  drugs. Disposal  (Do not dispose chemotherapy drugs this way, talk to your prescribing doctor instead.) Step 1: Mix drugs (do not crush) with dirt, kitty litter, or used coffee grounds and add a small amount of water  to dissolve any solid medications. Step 2: Seal drugs in plastic bag. Step 3: Place plastic bag in trash. Step 4: Take prescription container and scratch out personal information, then recycle or throw away.  Operative Site  You have a liquid bandage over your incisions, this will begin to flake off in about a week. Ok to English as a second language teacher. Keep wound clean and dry. No baths or swimming. No lifting more than 10 pounds.  Contact Information: If you have questions or concerns, please call our office, [phone], Monday- Thursday 8AM-5PM and Friday 8AM-12Noon.  If it is after hours or on the weekend, please call [facility] Main Number, [phone], and ask to speak to the surgeon on call for Dr. Cherilyn Corn at [facility].   SPECIFIC COMPLICATIONS TO WATCH FOR: Inability to urinate Fever over 101? F by mouth Nausea and vomiting lasting longer than 24 hours. Pain not relieved by medication ordered Swelling around the operative site Increased redness, warmth, hardness, around operative area Numbness, tingling, or cold fingers or toes Blood -soaked dressing, (small amounts of oozing may be normal) Increasing and progressive drainage from surgical area or exam site

## 2023-07-12 NOTE — Anesthesia Preprocedure Evaluation (Addendum)
 Anesthesia Evaluation  Patient identified by MRN, date of birth, ID band Patient awake    Reviewed: Allergy & Precautions, NPO status   History of Anesthesia Complications Negative for: history of anesthetic complications  Airway Mallampati: II  TM Distance: >3 FB Neck ROM: Full    Dental  (+) Partial Upper, Missing,  Missing upper teeth. Upper partial removed.:   Pulmonary asthma , neg sleep apnea (OSA resolved ater bariatric surgery.), pneumonia, resolved, Not current smoker   breath sounds clear to auscultation       Cardiovascular hypertension, Normal cardiovascular exam Rhythm:Regular Rate:Normal     Neuro/Psych  PSYCHIATRIC DISORDERS Anxiety Depression     Neuromuscular disease  C-spine cleared    GI/Hepatic ,GERD  ,,  Endo/Other  Hypothyroidism    Renal/GU      Musculoskeletal  (+) Arthritis , Osteoarthritis,  Chronic back pain.   Abdominal   Peds  Hematology  (+) Blood dyscrasia, anemia   Anesthesia Other Findings   Reproductive/Obstetrics                             Anesthesia Physical Anesthesia Plan  ASA: 3  Anesthesia Plan: General   Post-op Pain Management:    Induction: Intravenous  PONV Risk Score and Plan: 2 and Ondansetron , Dexamethasone  and Midazolam   Airway Management Planned: LMA  Additional Equipment:   Intra-op Plan:   Post-operative Plan:   Informed Consent: I have reviewed the patients History and Physical, chart, labs and discussed the procedure including the risks, benefits and alternatives for the proposed anesthesia with the patient or authorized representative who has indicated his/her understanding and acceptance.       Plan Discussed with: CRNA and Anesthesiologist  Anesthesia Plan Comments:        Anesthesia Quick Evaluation

## 2023-07-12 NOTE — Anesthesia Procedure Notes (Addendum)
 Procedure Name: Intubation Date/Time: 07/12/2023 7:58 AM  Performed by: Juluis Ok, CRNAPre-anesthesia Checklist: Patient identified, Emergency Drugs available, Suction available and Patient being monitored Patient Re-evaluated:Patient Re-evaluated prior to induction Oxygen Delivery Method: Circle system utilized Preoxygenation: Pre-oxygenation with 100% oxygen Induction Type: IV induction Ventilation: Mask ventilation without difficulty Laryngoscope Size: Mac and 4 Grade View: Grade II Tube type: Oral Tube size: 7.0 mm Number of attempts: 1 Airway Equipment and Method: Stylet Placement Confirmation: ETT inserted through vocal cords under direct vision, CO2 detector, positive ETCO2 and breath sounds checked- equal and bilateral Secured at: 22 (OETT positioned 22 cm at lower lip.) cm Tube secured with: Tape Dental Injury: Teeth and Oropharynx as per pre-operative assessment  Comments: Difficult to seat LMA with out twisting. Atraumatic insertion. Lips and teeth remain in preoperative condition.

## 2023-07-12 NOTE — Anesthesia Procedure Notes (Addendum)
 Procedure Name: LMA Insertion Date/Time: 07/12/2023 7:46 AM  Performed by: Juluis Ok, CRNAPre-anesthesia Checklist: Patient identified, Emergency Drugs available, Suction available and Patient being monitored Patient Re-evaluated:Patient Re-evaluated prior to induction Oxygen Delivery Method: Circle system utilized Preoxygenation: Pre-oxygenation with 100% oxygen Induction Type: IV induction Ventilation: Mask ventilation without difficulty LMA: LMA inserted LMA Size: 4.0 Number of attempts: 1 Placement Confirmation: positive ETCO2, CO2 detector and breath sounds checked- equal and bilateral ETT to lip (cm): No marking available. Tube secured with: Tape Dental Injury: Teeth and Oropharynx as per pre-operative assessment  Comments: Atraumatic insertion of LMA size 4. Lips and teeth remain in preoperative condition.

## 2023-07-13 ENCOUNTER — Encounter (HOSPITAL_COMMUNITY): Payer: Self-pay | Admitting: Surgery

## 2023-07-13 LAB — SURGICAL PATHOLOGY

## 2023-07-13 NOTE — Anesthesia Postprocedure Evaluation (Signed)
 Anesthesia Post Note  Patient: Jessica Stevens  Procedure(s) Performed: EXCISIONAL BIOPSY OF RIGHT BREAST MASS (Right: Breast)  Patient location during evaluation: Phase II Anesthesia Type: General Level of consciousness: awake Pain management: pain level controlled Vital Signs Assessment: post-procedure vital signs reviewed and stable Respiratory status: spontaneous breathing and respiratory function stable Cardiovascular status: blood pressure returned to baseline and stable Postop Assessment: no headache and no apparent nausea or vomiting Anesthetic complications: no Comments: Late entry   No notable events documented.   Last Vitals:  Vitals:   07/12/23 0945 07/12/23 1000  BP: 130/74 (!) 156/84  Pulse: 69 98  Resp: 13 18  Temp:  36.7 C  SpO2: 100% 97%    Last Pain:  Vitals:   07/12/23 1000  TempSrc: Oral  PainSc: 2                  Coretha Dew

## 2023-07-19 DIAGNOSIS — E538 Deficiency of other specified B group vitamins: Secondary | ICD-10-CM | POA: Diagnosis not present

## 2023-07-26 ENCOUNTER — Ambulatory Visit (INDEPENDENT_AMBULATORY_CARE_PROVIDER_SITE_OTHER): Admitting: Surgery

## 2023-07-26 ENCOUNTER — Encounter: Payer: Self-pay | Admitting: Surgery

## 2023-07-26 VITALS — BP 152/87 | HR 68 | Temp 98.1°F | Resp 12 | Ht 63.0 in | Wt 148.0 lb

## 2023-07-26 DIAGNOSIS — Z09 Encounter for follow-up examination after completed treatment for conditions other than malignant neoplasm: Secondary | ICD-10-CM

## 2023-07-30 NOTE — Progress Notes (Signed)
 [facility] Note   HPI:  58 y.o. Female presents to clinic for post-op follow-up status post excisional biopsy of a right breast mass on 5/15.  Patient has been doing very well since that surgery.  She is tolerating a diet and her pain is well-controlled.  She denies issues at her incision site.  Denies fevers and chills.  Review of Systems:  All other review of systems: otherwise negative   Vital Signs:  BP (!) 152/87   Pulse 68   Temp 98.1 F (36.7 C) (Oral)   Resp 12   Ht 5\' 3"  (1.6 m)   Wt 148 lb (67.1 kg)   SpO2 96%   BMI 26.22 kg/m    Physical Exam:  Physical Exam Vitals reviewed.  Constitutional:      Appearance: Normal appearance.  Chest:     Comments: Right chest incision site healing well Neurological:     Mental Status: She is alert.     Laboratory studies: None   Imaging:  None  Pathology:  A.  RIGHT BREAST TISSUE OVERLYING RIGHT BREAST MASS, EXCISION:  -  Benign vascularized fibroadipose (predominantly adipose) soft tissue.   Note: There is no breast parenchyma present in the sections examined.   B.  RIGHT BREAST MASS ON TOP OF PECTORALIS MAJOR MUSCLE, LUMPECTOMY:  -  Well-circumscribed dense stromal fibrosis with associated mild  chronic inflammation.  -  Biopsy site present   Assessment:  58 y.o. yo Female patient presents for follow-up status post excisional biopsy of a right breast mass on 5/15.  Plan:  - Patient has been doing very well since the surgery.  Tolerating a diet, and pain well-controlled. - We discussed her final pathology results all being benign without any evidence of atypia or malignancy - Advised that she can return to her regular annual mammograms for her screening for breast cancer - Follow up with me as needed  All of the above recommendations were discussed with the patient, and all of patient's questions were answered to her expressed satisfaction.  Note: Portions of this report may have been  transcribed using voice recognition software. Every effort has been made to ensure accuracy; however, inadvertent computerized transcription errors may still be present.   Lidia Reels, DO [facility] [address] [place], [place] [address] [phone] (office)

## 2023-08-21 DIAGNOSIS — E538 Deficiency of other specified B group vitamins: Secondary | ICD-10-CM | POA: Diagnosis not present

## 2023-08-23 ENCOUNTER — Ambulatory Visit: Admitting: Plastic Surgery

## 2023-08-23 ENCOUNTER — Encounter: Payer: Self-pay | Admitting: Plastic Surgery

## 2023-08-23 VITALS — BP 151/84 | HR 58 | Ht 62.0 in | Wt 147.6 lb

## 2023-08-23 DIAGNOSIS — M793 Panniculitis, unspecified: Secondary | ICD-10-CM

## 2023-08-23 DIAGNOSIS — Z9884 Bariatric surgery status: Secondary | ICD-10-CM

## 2023-08-23 DIAGNOSIS — E65 Localized adiposity: Secondary | ICD-10-CM | POA: Diagnosis not present

## 2023-08-23 NOTE — Progress Notes (Signed)
 Referring Provider Shona Norleen PEDLAR, MD [address] [place],  [place] [address]   CC:  Chief Complaint  Patient presents with   Consult           Jessica Stevens is an 58 y.o. female.  HPI: Jessica Stevens is a very pleasant 58 year old female who presents today for discussion of excision of excess skin after weight loss.  Patient underwent gastric bypass in April 2024.  She is lost approximately 200 pounds.  Her weight is currently stable.  She has excess skin on the medial thighs upper arms and anterior abdominal wall.  The anterior abdominal wall is what bothers her most.  It is difficult for her to find clothing which fits appropriately.  She requires constant shape wear to hold the pannus in place.  She does endorse occasional rashes on the posterior aspect of the pannus and in the intertriginous regions.  She is interested in having the excess anterior abdominal wall skin and fat excised.  Allergies  Allergen Reactions   Oxycodone -Acetaminophen  Rash    Not allergic to tylenol     Outpatient Encounter Medications as of 08/23/2023  Medication Sig Note   amLODipine  (NORVASC ) 5 MG tablet Take 1 tablet by mouth daily.    busPIRone  (BUSPAR ) 5 MG tablet Take 5 mg by mouth 2 (two) times daily as needed (stress).    clonazePAM (KLONOPIN) 0.5 MG tablet Take 0.5 mg by mouth 2 (two) times daily as needed for anxiety.    cyanocobalamin  (VITAMIN B12) 1000 MCG/ML injection Inject 1,000 mcg into the muscle every 30 (thirty) days.    diclofenac  Sodium (VOLTAREN ) 1 % GEL Apply 2 g topically daily as needed (pain).    furosemide  (LASIX ) 20 MG tablet Take 20 mg by mouth every morning.    hydrALAZINE  (APRESOLINE ) 25 MG tablet Take 25 mg by mouth 2 (two) times daily.    HYDROcodone -acetaminophen  (NORCO/VICODIN) 5-325 MG tablet Take 1 tablet by mouth every 6 (six) hours as needed for moderate pain (pain score 4-6).    HYDROmorphone  (DILAUDID ) 2 MG tablet Take 1 tablet (2 mg total) by mouth every 4  (four) hours as needed for severe pain (for severe pain only and do not take with Hydrocodone ). 10/25/2022: Pt states it's for her stomach?   ketorolac  (TORADOL ) 10 MG tablet Take 1 tablet (10 mg total) by mouth every 6 (six) hours as needed.    levocetirizine (XYZAL ) 5 MG tablet Take 5 mg by mouth every evening.    levothyroxine  (SYNTHROID ) 112 MCG tablet Take 112 mcg by mouth daily before breakfast.    losartan  (COZAAR ) 50 MG tablet Take 0.5 tablets (25 mg total) by mouth daily.    methocarbamol  (ROBAXIN ) 500 MG tablet Take 1 tablet (500 mg total) by mouth 2 (two) times daily.    Multiple Vitamins-Minerals (AIRBORNE PO) Take 1 tablet by mouth in the morning and at bedtime.    Multiple Vitamins-Minerals (HAIR SKIN & NAILS) TABS Take 1 tablet by mouth daily.    pantoprazole  (PROTONIX ) 40 MG tablet Take 1 tablet (40 mg total) by mouth daily.    potassium chloride  (MICRO-K ) 10 MEQ CR capsule Take 10 mEq by mouth daily.    SYMBICORT 80-4.5 MCG/ACT inhaler Inhale 2 puffs into the lungs 2 (two) times daily.    No facility-administered encounter medications on file as of 08/23/2023.     Past Medical History:  Diagnosis Date   Allergy    Anemia    Ankle pain, left  Anxiety    Arthritis of knee, degenerative    Asthma    Carpal tunnel syndrome    Cervical radiculopathy    Chronic back pain    Complete rupture of rotator cuff    Complete rupture of rotator cuff    Depression    GERD (gastroesophageal reflux disease)    Gynecomastia    HNP (herniated nucleus pulposus), cervical    HNP (herniated nucleus pulposus), cervical    Hyperlipidemia    Hypertension    Hypothyroidism    Knee pain    OA (osteoarthritis) of knee    Obesity    Sleep apnea    hx of    Urinary incontinence     Past Surgical History:  Procedure Laterality Date   arthroscopy  left knee  08/14/2008   Dr. Margrette    BIOPSY  09/20/2020   Procedure: BIOPSY;  Surgeon: Legrand Victory LITTIE DOUGLAS, MD;  Location: [facility]  ENDOSCOPY;  Service: Gastroenterology;;   BREAST BIOPSY Right 06/05/2023   Benign breast tissue with dense stromal fibrosis/US  RT BREAST BX W LOC DEV 1ST LESION IMG BX SPEC US  GUIDE 06/05/2023 Lennon Nest, MD [facility]   BREAST LUMPECTOMY Right 07/12/2023   Procedure: EXCISIONAL BIOPSY OF RIGHT BREAST MASS;  Surgeon: Evonnie Dorothyann LABOR, DO;  Location: [facility]S;  Service: General;  Laterality: Right;   COLONOSCOPY N/A 03/11/2014   Procedure: COLONOSCOPY;  Surgeon: Claudis RAYMOND Rivet, MD;  Location: [facility];  Service: Endoscopy;  Laterality: N/A;  730 - moved to 1/13 @ 12:00   COLONOSCOPY     ESOPHAGOGASTRODUODENOSCOPY (EGD) WITH PROPOFOL  N/A 09/20/2020   Procedure: ESOPHAGOGASTRODUODENOSCOPY (EGD) WITH PROPOFOL ;  Surgeon: Legrand Victory LITTIE DOUGLAS, MD;  Location: [facility] END[facility];  Service: Gastroenterology;  Laterality: N/A;   GASTRIC BYPASS  05/2021   PARTIAL HYSTERECTOMY     POLYPECTOMY     REVERSE SHOULDER ARTHROPLASTY Right 05/27/2020   Procedure: REVERSE SHOULDER ARTHROPLASTY;  Surgeon: Dozier Soulier, MD;  Location: [facility];  Service: Orthopedics;  Laterality: Right;   TOTAL KNEE ARTHROPLASTY Left 03/24/2022   Procedure: TOTAL KNEE ARTHROPLASTY;  Surgeon: Kay Kemps, MD;  Location: [facility]S;  Service: Orthopedics;  Laterality: Left;  spinal and general   tube insert in right ear  02/27/2001   UPPER GI ENDOSCOPY N/A 05/30/2021   Procedure: UPPER GI ENDOSCOPY;  Surgeon: Lyndel Deward PARAS, MD;  Location: [facility] ORS;  Service: General;  Laterality: N/A;    Family History  Problem Relation Age of Onset   Diabetes Mother    Hypertension Mother    Cancer Father        left nephrectomy    Hypertension Father    Hypertension Sister    Diabetes Maternal Grandmother    Hypertension Maternal Grandmother    Hypertension Maternal Grandfather    Colon cancer Neg Hx    Colon polyps Neg Hx    Esophageal cancer Neg Hx    Rectal cancer Neg Hx    Stomach cancer Neg Hx     Social History    Social History Narrative   Not on file     Review of Systems General: Denies fevers, chills, weight loss CV: Denies chest pain, shortness of breath, palpitations Abdomen: Excess skin of fat on the anterior abdominal wall  Physical Exam    08/23/2023    1:45 PM 07/26/2023    9:56 AM 07/12/2023   10:00 AM  Vitals with BMI  Height 5' 2 5' 3   Weight 147 lbs  10 oz 148 lbs   BMI 26.99 26.22   Systolic 151 152 843  Diastolic 84 87 84  Pulse 58 68 98    General:  No acute distress,  Alert and oriented, Non-Toxic, Normal speech and affect Abdomen: Patient has a moderately large pannus which extends well past symphysis pubis.  There are no rashes on exam but she does have discoloration of the posterior aspect of the pannus consistent with previous rashes.  This is documented in her photographs.  She does have a history of C-section with a well-healed scar on the lower abdomen.  There are no palpable hernias on physical exam. Mammogram: Patient had a suspicious mammogram in March which was evaluated breast biopsy and found to be benign Assessment/Plan Pannus: Patient has a moderately large pannus and I believe she would benefit from a panniculectomy.  I showed her the location of the incisions and we discussed the unpredictable nature of scarring and wound healing.  We discussed the risk of bleeding, infection, and seroma formation.  She understands that she will have drains for 1 to 4 weeks.  She will also need to wear compression for 6 weeks.  We discussed the postoperative limitations including no heavy lifting greater than 20 pounds, no vigorous activity, no submerging the incisions in water  for 6 weeks.  She may return to light activity as tolerated.  She will be encouraged to begin ambulation immediately after surgery to help decrease the risk of DVT.  All questions were answered to her satisfaction.  Photographs were obtained today with her consent.  Will submit her for panniculectomy at  her request.  Jessica Stevens 08/23/2023, 2:16 PM

## 2023-09-14 DIAGNOSIS — E538 Deficiency of other specified B group vitamins: Secondary | ICD-10-CM | POA: Diagnosis not present

## 2023-10-02 DIAGNOSIS — I10 Essential (primary) hypertension: Secondary | ICD-10-CM | POA: Diagnosis not present

## 2023-10-02 DIAGNOSIS — E039 Hypothyroidism, unspecified: Secondary | ICD-10-CM | POA: Diagnosis not present

## 2023-10-02 DIAGNOSIS — E538 Deficiency of other specified B group vitamins: Secondary | ICD-10-CM | POA: Diagnosis not present

## 2023-10-05 DIAGNOSIS — E039 Hypothyroidism, unspecified: Secondary | ICD-10-CM | POA: Diagnosis not present

## 2023-10-05 DIAGNOSIS — I129 Hypertensive chronic kidney disease with stage 1 through stage 4 chronic kidney disease, or unspecified chronic kidney disease: Secondary | ICD-10-CM | POA: Diagnosis not present

## 2023-10-05 DIAGNOSIS — N183 Chronic kidney disease, stage 3 unspecified: Secondary | ICD-10-CM | POA: Diagnosis not present

## 2023-10-05 DIAGNOSIS — K219 Gastro-esophageal reflux disease without esophagitis: Secondary | ICD-10-CM | POA: Diagnosis not present

## 2023-10-05 DIAGNOSIS — E782 Mixed hyperlipidemia: Secondary | ICD-10-CM | POA: Diagnosis not present

## 2023-10-05 DIAGNOSIS — J4540 Moderate persistent asthma, uncomplicated: Secondary | ICD-10-CM | POA: Diagnosis not present

## 2023-10-05 DIAGNOSIS — D631 Anemia in chronic kidney disease: Secondary | ICD-10-CM | POA: Diagnosis not present

## 2023-10-05 DIAGNOSIS — Z789 Other specified health status: Secondary | ICD-10-CM | POA: Diagnosis not present

## 2023-10-05 DIAGNOSIS — D473 Essential (hemorrhagic) thrombocythemia: Secondary | ICD-10-CM | POA: Diagnosis not present

## 2023-10-05 DIAGNOSIS — I10 Essential (primary) hypertension: Secondary | ICD-10-CM | POA: Diagnosis not present

## 2023-10-15 DIAGNOSIS — E538 Deficiency of other specified B group vitamins: Secondary | ICD-10-CM | POA: Diagnosis not present

## 2023-11-19 DIAGNOSIS — E538 Deficiency of other specified B group vitamins: Secondary | ICD-10-CM | POA: Diagnosis not present

## 2024-01-01 ENCOUNTER — Other Ambulatory Visit (HOSPITAL_COMMUNITY): Payer: Self-pay | Admitting: Family Medicine

## 2024-01-01 DIAGNOSIS — M25511 Pain in right shoulder: Secondary | ICD-10-CM

## 2024-01-07 ENCOUNTER — Other Ambulatory Visit (HOSPITAL_COMMUNITY): Payer: Self-pay | Admitting: Nurse Practitioner

## 2024-01-07 ENCOUNTER — Ambulatory Visit (HOSPITAL_COMMUNITY)
Admission: RE | Admit: 2024-01-07 | Discharge: 2024-01-07 | Disposition: A | Discharge status: Home / Self Care | Source: Ambulatory Visit | Attending: Family Medicine | Admitting: Family Medicine

## 2024-01-07 DIAGNOSIS — M25511 Pain in right shoulder: Secondary | ICD-10-CM

## 2024-01-08 ENCOUNTER — Other Ambulatory Visit: Payer: Self-pay

## 2024-01-08 ENCOUNTER — Encounter (HOSPITAL_COMMUNITY): Payer: Self-pay | Admitting: *Deleted

## 2024-01-08 ENCOUNTER — Emergency Department (HOSPITAL_COMMUNITY)

## 2024-01-08 ENCOUNTER — Emergency Department (HOSPITAL_COMMUNITY)
Admission: EM | Admit: 2024-01-08 | Discharge: 2024-01-08 | Disposition: A | Discharge status: Home / Self Care | Attending: Emergency Medicine | Admitting: Emergency Medicine

## 2024-01-08 DIAGNOSIS — M79601 Pain in right arm: Secondary | ICD-10-CM

## 2024-01-08 DIAGNOSIS — S40011A Contusion of right shoulder, initial encounter: Secondary | ICD-10-CM | POA: Insufficient documentation

## 2024-01-08 DIAGNOSIS — D649 Anemia, unspecified: Secondary | ICD-10-CM | POA: Diagnosis not present

## 2024-01-08 DIAGNOSIS — X58XXXA Exposure to other specified factors, initial encounter: Secondary | ICD-10-CM | POA: Insufficient documentation

## 2024-01-08 DIAGNOSIS — M25511 Pain in right shoulder: Secondary | ICD-10-CM | POA: Diagnosis present

## 2024-01-08 LAB — COMPREHENSIVE METABOLIC PANEL WITH GFR
ALT: 21 U/L (ref 0–44)
AST: 29 U/L (ref 15–41)
Albumin: 3.8 g/dL (ref 3.5–5.0)
Alkaline Phosphatase: 90 U/L (ref 38–126)
Anion gap: 9 (ref 5–15)
BUN: 26 mg/dL — ABNORMAL HIGH (ref 6–20)
CO2: 28 mmol/L (ref 22–32)
Calcium: 9.4 mg/dL (ref 8.9–10.3)
Chloride: 104 mmol/L (ref 98–111)
Creatinine, Ser: 0.76 mg/dL (ref 0.44–1.00)
GFR, Estimated: >60 mL/min (ref >60)
Glucose, Bld: 89 mg/dL (ref 70–99)
Potassium: 4 mmol/L (ref 3.5–5.1)
Sodium: 141 mmol/L (ref 135–145)
Total Bilirubin: 0.3 mg/dL (ref 0.0–1.2)
Total Protein: 6.7 g/dL (ref 6.5–8.1)

## 2024-01-08 LAB — CBC WITH DIFFERENTIAL/PLATELET
Abs Immature Granulocytes: 0.02 K/uL (ref 0.00–0.07)
Basophils Absolute: 0 K/uL (ref 0.0–0.1)
Basophils Relative: 0 %
Eosinophils Absolute: 0.2 K/uL (ref 0.0–0.5)
Eosinophils Relative: 3 %
HCT: 31 % — ABNORMAL LOW (ref 36.0–46.0)
Hemoglobin: 10.1 g/dL — ABNORMAL LOW (ref 12.0–15.0)
Immature Granulocytes: 0 %
Lymphocytes Relative: 40 %
Lymphs Abs: 2.8 K/uL (ref 0.7–4.0)
MCH: 30.8 pg (ref 26.0–34.0)
MCHC: 32.6 g/dL (ref 30.0–36.0)
MCV: 94.5 fL (ref 80.0–100.0)
Monocytes Absolute: 0.5 K/uL (ref 0.1–1.0)
Monocytes Relative: 7 %
Neutro Abs: 3.4 K/uL (ref 1.7–7.7)
Neutrophils Relative %: 50 %
Platelets: 264 K/uL (ref 150–400)
RBC: 3.28 MIL/uL — ABNORMAL LOW (ref 3.87–5.11)
RDW: 15.5 % (ref 11.5–15.5)
WBC: 6.9 K/uL (ref 4.0–10.5)
nRBC: 0 % (ref 0.0–0.2)

## 2024-01-08 MED ORDER — KETOROLAC TROMETHAMINE 15 MG/ML IJ SOLN
15.0000 mg | Freq: Once | INTRAMUSCULAR | Status: AC
  Administered 2024-01-08: 15 mg via INTRAMUSCULAR
  Filled 2024-01-08: qty 1

## 2024-01-08 MED ORDER — ACETAMINOPHEN 325 MG PO TABS
650.0000 mg | ORAL_TABLET | Freq: Once | ORAL | Status: AC
  Administered 2024-01-08: 650 mg via ORAL
  Filled 2024-01-08: qty 2

## 2024-01-08 MED ORDER — MORPHINE SULFATE (PF) 4 MG/ML IV SOLN
4.0000 mg | Freq: Once | INTRAVENOUS | Status: AC
  Administered 2024-01-08: 4 mg via INTRAMUSCULAR
  Filled 2024-01-08: qty 1

## 2024-01-08 MED ORDER — LIDOCAINE 5 % EX PTCH
1.0000 | MEDICATED_PATCH | CUTANEOUS | Status: DC
  Administered 2024-01-08: 1 via TRANSDERMAL
  Filled 2024-01-08: qty 1

## 2024-01-08 MED ORDER — DICLOFENAC SODIUM 1 % EX GEL: 2.0000 g | Freq: Every day | CUTANEOUS | 0 refills | Status: AC | PRN

## 2024-01-08 NOTE — Discharge Instructions (Addendum)
 He was seen in the emergency room today for right arm pain with swelling and bruising.  Your x-ray from yesterday not show any acute abnormalities, your ultrasound today did not show a blood clot, your blood work today showed some mild anemia similar to your past labs over the past year.  You should have your blood count rechecked closely by your primary care doctor, if you have increased swelling or bruising, numbness tingling or weakness or other worrisome symptoms come back to the ER right away, otherwise follow-up closely with orthopedics.

## 2024-01-08 NOTE — ED Triage Notes (Signed)
 Pt with rt shoulder pain and a knot since Friday. Pt states she has had rt shoulder surgery, came yesterday for an xray.

## 2024-01-08 NOTE — ED Provider Notes (Signed)
 [place] [facility] AT [facility] Provider Note   CSN: [id number] Arrival date & time: 01/08/24  1246     Patient presents with: Shoulder Pain (Right/)   Jessica Stevens is a 58 y.o. female. She has history of chronic back pain, chronic shoulder pain with prior right shoulder replacement in the past couple of years per the patient.  She presents ER today complaining of right shoulder pain and swelling that started 6 days ago.  She denies injury or trauma or any heavy lifting.  She states the pain sometimes radiates into the ulnar aspect of the forearm and hand and she has pain in the shoulder when she grips.  She has pain with movement but is able to range her shoulder.  She denies fevers or chills.  She was at her PCP yesterday getting blood work and mentioned this to them so they ordered an x-ray which she had completed, but had not gotten results yet and was still having pain and swelling so decided to check into the ER today.  Is not on blood thinners, has not had echo in the past.  She states initially the swelling seem to extend up into her neck which is now resolved.    Shoulder Pain      Prior to Admission medications   Medication Sig Start Date End Date Taking? Authorizing Provider  amLODipine  (NORVASC ) 5 MG tablet Take 1 tablet by mouth daily.    [provider]  busPIRone  (BUSPAR ) 5 MG tablet Take 5 mg by mouth 2 (two) times daily as needed (stress). 04/25/20   [provider]  clonazePAM (KLONOPIN) 0.5 MG tablet Take 0.5 mg by mouth 2 (two) times daily as needed for anxiety. 08/29/17   [provider]  cyanocobalamin  (VITAMIN B12) 1000 MCG/ML injection Inject 1,000 mcg into the muscle every 30 (thirty) days.    [provider]  diclofenac  Sodium (VOLTAREN ) 1 % GEL Apply 2 g topically daily as needed (pain).    [provider]  furosemide  (LASIX ) 20 MG tablet Take 20 mg by mouth every morning. 06/14/21    [provider]  hydrALAZINE  (APRESOLINE ) 25 MG tablet Take 25 mg by mouth 2 (two) times daily.    [provider]  HYDROcodone -acetaminophen  (NORCO/VICODIN) 5-325 MG tablet Take 1 tablet by mouth every 6 (six) hours as needed for moderate pain (pain score 4-6). 07/12/23   Pappayliou, Dorothyann A, DO  HYDROmorphone  (DILAUDID ) 2 MG tablet Take 1 tablet (2 mg total) by mouth every 4 (four) hours as needed for severe pain (for severe pain only and do not take with Hydrocodone ). 03/24/22   Kay Kemps, MD  ketorolac  (TORADOL ) 10 MG tablet Take 1 tablet (10 mg total) by mouth every 6 (six) hours as needed. 04/14/23   Ula Prentice SAUNDERS, MD  levocetirizine (XYZAL ) 5 MG tablet Take 5 mg by mouth every evening.    [provider]  levothyroxine  (SYNTHROID ) 112 MCG tablet Take 112 mcg by mouth daily before breakfast. 11/05/18   [provider]  losartan  (COZAAR ) 50 MG tablet Take 0.5 tablets (25 mg total) by mouth daily. 10/28/22   Ricky Fines, MD  methocarbamol  (ROBAXIN ) 500 MG tablet Take 1 tablet (500 mg total) by mouth 2 (two) times daily. 04/14/23   Ula Prentice SAUNDERS, MD  Multiple Vitamins-Minerals (AIRBORNE PO) Take 1 tablet by mouth in the morning and at bedtime.    [provider]  Multiple Vitamins-Minerals (HAIR SKIN & NAILS) TABS  Take 1 tablet by mouth daily.    [provider]  pantoprazole  (PROTONIX ) 40 MG tablet Take 1 tablet (40 mg total) by mouth daily. 05/31/21   Stechschulte, Deward PARAS, MD  potassium chloride  (MICRO-K ) 10 MEQ CR capsule Take 10 mEq by mouth daily. 06/14/21   [provider]  SYMBICORT 80-4.5 MCG/ACT inhaler Inhale 2 puffs into the lungs 2 (two) times daily. 10/09/22   [provider]    Allergies: Oxycodone -acetaminophen     Review of Systems  Updated Vital Signs BP (!) 161/88 (BP Location: Left Arm)   Pulse 65   Temp 97.8 F (36.6 C) (Oral)   Resp 18   Ht 5' 2 (1.575 m)   Wt 67.1 kg   SpO2 98%   BMI 27.07  kg/m   Physical Exam Vitals and nursing note reviewed.  Constitutional:      General: She is not in acute distress.    Appearance: She is well-developed.  HENT:     Head: Normocephalic and atraumatic.     Mouth/Throat:     Mouth: Mucous membranes are moist.  Eyes:     Extraocular Movements: Extraocular movements intact.     Conjunctiva/sclera: Conjunctivae normal.     Pupils: Pupils are equal, round, and reactive to light.  Cardiovascular:     Rate and Rhythm: Normal rate and regular rhythm.     Heart sounds: No murmur heard. Pulmonary:     Effort: Pulmonary effort is normal. No respiratory distress.     Breath sounds: Normal breath sounds.  Abdominal:     Palpations: Abdomen is soft.     Tenderness: There is no abdominal tenderness.  Musculoskeletal:        General: No swelling.     Cervical back: Neck supple.     Comments: There is some ecchymosis and swelling over the right deltoid area, patient has intact range of motion of the right shoulder but has pain with range of motion.  Right radial pulses intact.  No swelling of the right neck  Skin:    General: Skin is warm and dry.     Capillary Refill: Capillary refill takes less than 2 seconds.     Comments: No cellulitis but there is ecchymosis over the right deltoid area, medial aspect of the right arm to the level of the elbow  Neurological:     General: No focal deficit present.     Mental Status: She is alert and oriented to person, place, and time.     Sensory: No sensory deficit.     Motor: No weakness.     Gait: Gait normal.  Psychiatric:        Mood and Affect: Mood normal.     (all labs ordered are listed, but only abnormal results are displayed) Labs Reviewed - No data to display  EKG: None  Radiology: No results found.   Procedures   Medications Ordered in the ED - No data to display  Clinical Course as of 01/08/24 1433  Tue Jan 08, 2024  1433 Here with right shoulder pain and swelling for about  6 days without any injury or trauma.  No fevers or chills, the swelling seems to be primarily to the deltoid area.  Will order ultrasound, had x-ray yesterday that I called radiology to get read today.  Will get basic labs. [CB]    Clinical Course User Index [CB] Suellen Cantor A, PA-C  Medical Decision Making Amount and/or Complexity of Data Reviewed Labs: ordered.  Risk OTC drugs. Prescription drug management.  This patient presents to the ED for concern of right arm pain x 5 days with swelling, this involves an extensive number of treatment options, and is a complaint that carries with it a high risk of complications and morbidity.  The differential diagnosis includes DVT, cellulitis, hematoma, strain, arthritis, other   Co morbidities that complicate the patient evaluation  Shoulder replacement, chronic pain managed with hydrocodone /acetaminophen  10/325   Additional history obtained:  Additional history obtained from EMR External records from outside source obtained and reviewed including x-ray done yesterday as outpatient showing no acute findings, prior notes and labs  Lab Tests:  I Ordered, and personally interpreted labs.  The pertinent results include: BC with no leukocytosis, patient has mild anemia though over the past year has been as low as 9.7.  Platelets are normal.  CMP has slightly increased BUN otherwise normal   Imaging Studies ordered:  I ordered imaging studies including ultrasound right upper extremity I independently visualized and interpreted imaging which showed no DVT I agree with the radiologist interpretation     Problem List / ED Course / Critical interventions / Medication management  Right arm pain with swelling and ecchymosis-patient denies any injury or trauma she is not on blood thinners, ultrasound ruled out a DVT.  X-rays today was negative she denies injury or trauma, I am not sure what caused her to have  this ecchymosis ago, her symptoms seem to be more consistent with hematoma though there is no direct cause that patient recalls.  Discussed close outpatient follow with PCP and/for orthopedics and strict return precautions.  She has normal range of motion of the right shoulder and pain seems to be primarily in the deltoid area.  Patient was reexamined and there is no worsening of her symptoms while in the ER and she has intact distal pulses and normal sensation and strength. I ordered medication including Toradol  and morphine  for pain Reevaluation of the patient after these medicines showed that the patient improved I have reviewed the patients home medicines and have made adjustments as needed.  Patient is already on hydrocodone /acetaminophen  chronically, cannot take oxycodone .  Advise she call her PCP for further pain management          Final diagnoses:  None    ED Discharge Orders     None          Suellen Sherran DELENA DEVONNA 01/08/24 2253    Freddi Hamilton, MD 01/09/24 [phone]

## 2024-01-22 ENCOUNTER — Encounter (INDEPENDENT_AMBULATORY_CARE_PROVIDER_SITE_OTHER): Payer: Self-pay | Admitting: *Deleted
